# Patient Record
Sex: Female | Born: 1980 | Race: White | Hispanic: No | Smoking: Current some day smoker
Health system: Southern US, Community
[De-identification: ages and names within clinical notes are randomized; demographics above are authoritative.]

## PROBLEM LIST (undated history)

## (undated) DIAGNOSIS — F32A Depression, unspecified: Secondary | ICD-10-CM

## (undated) DIAGNOSIS — F329 Major depressive disorder, single episode, unspecified: Secondary | ICD-10-CM

## (undated) DIAGNOSIS — E7439 Other disorders of intestinal carbohydrate absorption: Secondary | ICD-10-CM

## (undated) DIAGNOSIS — C801 Malignant (primary) neoplasm, unspecified: Secondary | ICD-10-CM

---
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
## 2008-04-02 ENCOUNTER — Emergency Department (HOSPITAL_COMMUNITY): Admission: EM | Admit: 2008-04-02 | Discharge: 2008-04-02 | Payer: Self-pay | Admitting: Emergency Medicine

## 2019-03-04 ENCOUNTER — Encounter (HOSPITAL_COMMUNITY): Payer: Self-pay | Admitting: Emergency Medicine

## 2019-03-04 ENCOUNTER — Emergency Department (HOSPITAL_COMMUNITY)
Admission: EM | Admit: 2019-03-04 | Discharge: 2019-03-04 | Disposition: A | Discharge status: Home / Self Care | Payer: BLUE CROSS/BLUE SHIELD | Attending: Emergency Medicine | Admitting: Emergency Medicine

## 2019-03-04 ENCOUNTER — Other Ambulatory Visit: Payer: Self-pay

## 2019-03-04 ENCOUNTER — Emergency Department (HOSPITAL_COMMUNITY): Payer: BLUE CROSS/BLUE SHIELD

## 2019-03-04 DIAGNOSIS — F17210 Nicotine dependence, cigarettes, uncomplicated: Secondary | ICD-10-CM | POA: Insufficient documentation

## 2019-03-04 DIAGNOSIS — Z79899 Other long term (current) drug therapy: Secondary | ICD-10-CM | POA: Insufficient documentation

## 2019-03-04 DIAGNOSIS — K047 Periapical abscess without sinus: Secondary | ICD-10-CM | POA: Insufficient documentation

## 2019-03-04 HISTORY — DX: Depression, unspecified: F32.A

## 2019-03-04 HISTORY — DX: Major depressive disorder, single episode, unspecified: F32.9

## 2019-03-04 LAB — BASIC METABOLIC PANEL
Anion gap: 10 (ref 5–15)
BUN: 24 mg/dL — ABNORMAL HIGH (ref 6–20)
CO2: 24 mmol/L (ref 22–32)
Calcium: 9.4 mg/dL (ref 8.9–10.3)
Chloride: 103 mmol/L (ref 98–111)
Creatinine, Ser: 0.8 mg/dL (ref 0.44–1.00)
GFR calc Af Amer: >60 mL/min (ref >60)
GFR calc non Af Amer: >60 mL/min (ref >60)
Glucose, Bld: 113 mg/dL — ABNORMAL HIGH (ref 70–99)
Potassium: 4.5 mmol/L (ref 3.5–5.1)
Sodium: 137 mmol/L (ref 135–145)

## 2019-03-04 LAB — CBC WITH DIFFERENTIAL/PLATELET
Abs Immature Granulocytes: 0.32 10*3/uL — ABNORMAL HIGH (ref 0.00–0.07)
Basophils Absolute: 0.1 10*3/uL (ref 0.0–0.1)
Basophils Relative: 1 %
Eosinophils Absolute: 0.1 10*3/uL (ref 0.0–0.5)
Eosinophils Relative: 0 %
HCT: 41 % (ref 36.0–46.0)
Hemoglobin: 13 g/dL (ref 12.0–15.0)
Immature Granulocytes: 2 %
Lymphocytes Relative: 18 %
Lymphs Abs: 3 10*3/uL (ref 0.7–4.0)
MCH: 27.7 pg (ref 26.0–34.0)
MCHC: 31.7 g/dL (ref 30.0–36.0)
MCV: 87.4 fL (ref 80.0–100.0)
Monocytes Absolute: 0.8 10*3/uL (ref 0.1–1.0)
Monocytes Relative: 5 %
Neutro Abs: 12.3 10*3/uL — ABNORMAL HIGH (ref 1.7–7.7)
Neutrophils Relative %: 74 %
Platelets: 349 10*3/uL (ref 150–400)
RBC: 4.69 MIL/uL (ref 3.87–5.11)
RDW: 14.6 % (ref 11.5–15.5)
WBC: 16.6 10*3/uL — ABNORMAL HIGH (ref 4.0–10.5)
nRBC: 0 % (ref 0.0–0.2)

## 2019-03-04 LAB — PREGNANCY, URINE: Preg Test, Ur: NEGATIVE

## 2019-03-04 MED ORDER — IOHEXOL 300 MG/ML  SOLN
75.0000 mL | Freq: Once | INTRAMUSCULAR | Status: AC | PRN
  Administered 2019-03-04: 19:00:00 75 mL via INTRAVENOUS

## 2019-03-04 MED ORDER — AMOXICILLIN-POT CLAVULANATE 875-125 MG PO TABS: 1.0000 | ORAL_TABLET | Freq: Two times a day (BID) | ORAL | 0 refills | Status: DC

## 2019-03-04 MED ORDER — SODIUM CHLORIDE 0.9 % IV SOLN
3.0000 g | Freq: Once | INTRAVENOUS | Status: AC
  Administered 2019-03-04: 3 g via INTRAVENOUS
  Filled 2019-03-04: qty 3

## 2019-03-04 MED ORDER — SODIUM CHLORIDE 0.9 % IV BOLUS
1000.0000 mL | Freq: Once | INTRAVENOUS | Status: AC
  Administered 2019-03-04: 1000 mL via INTRAVENOUS

## 2019-03-04 NOTE — ED Triage Notes (Signed)
Pt states that she has a abscess in her neck and she was send by doctor she can't raise her arms above her head. Her throat is scratchy

## 2019-03-04 NOTE — ED Provider Notes (Addendum)
[facility] EMERGENCY DEPARTMENT Provider Note   CSN: [id number] Arrival date & time: 03/04/19  1436    History   Chief Complaint Chief Complaint  Patient presents with   Abscess    HPI Tara Aguilar is a 38 y.o. female.     HPI  38 year old female presents with anterior neck swelling and concern for infection.  She states that 6 days ago she went to [facility] because she woke up with right facial pain and swelling.  Was treated for an infection with clindamycin which she is currently still on, IV steroids and IV Toradol.  She is also been on pain meds.  States all of these symptoms including the facial swelling has improved.  Occasionally she will have some purulent drainage from her mouth.  However over the last couple days she has noticed anterior neck swelling.  Talked to a telemedicine doctor who recommended she come to the hospital.  She is noticed that when she lifts her arms above her head or tries to touch the back of her head, it causes increasing neck pain.  She states it is occasionally hard to swallow but otherwise has been swallowing pretty well and has had no shortness of breath.  There is been no fevers or cough. She is currently on a prednisone taper.  Past Medical History:  Diagnosis Date   Depression     There are no active problems to display for this patient.      OB History   No obstetric history on file.      Home Medications    Prior to Admission medications   Medication Sig Start Date End Date Taking? Authorizing Provider  buPROPion (WELLBUTRIN XL) 150 MG 24 hr tablet Take 150 mg by mouth daily.   Yes [provider]  clindamycin (CLEOCIN) 300 MG capsule Take 300 mg by mouth 4 (four) times daily.   Yes [provider]  HYDROcodone-acetaminophen (NORCO/VICODIN) 5-325 MG tablet Take 1 tablet by mouth every 12 (twelve) hours as needed for moderate pain.   Yes [provider]  ibuprofen (ADVIL) 400 MG tablet Take 800  mg by mouth every 6 (six) hours as needed.   Yes [provider]  lidocaine (XYLOCAINE) 2 % solution Use as directed 10 mLs in the mouth or throat 4 (four) times daily. 02/26/19  Yes [provider]  methylPREDNISolone (MEDROL DOSEPAK) 4 MG TBPK tablet Take 4 mg by mouth as directed.   Yes [provider]    Family History No family history on file.  Social History Social History   Tobacco Use   Smoking status: Current Every Day Smoker    Packs/day: 1.00    Years: 13.00    Pack years: 13.00    Types: Cigarettes   Smokeless tobacco: Never Used  Substance Use Topics   Alcohol use: Not on file    Comment: social    Drug use: Not on file     Allergies   Patient has no allergy information on record.   Review of Systems Review of Systems  Constitutional: Negative for fever.  HENT: Positive for facial swelling. Negative for sore throat.   Respiratory: Negative for cough and shortness of breath.   Gastrointestinal: Negative for vomiting.  Musculoskeletal: Positive for neck pain.  All other systems reviewed and are negative.    Physical Exam Updated Vital Signs BP 136/87    Pulse 88    Temp 98.6 F (37 C) (Oral)  Resp 19    Ht 5\' 6"  (1.676 m)    Wt 131.5 kg    SpO2 96%    BMI 46.81 kg/m   Physical Exam Vitals signs and nursing note reviewed.  Constitutional:      General: She is not in acute distress.    Appearance: She is well-developed. She is obese. She is not ill-appearing or diaphoretic.  HENT:     Head: Normocephalic and atraumatic.      Right Ear: External ear normal.     Left Ear: External ear normal.     Nose: Nose normal.     Mouth/Throat:     Pharynx: Oropharynx is clear. Uvula midline. No pharyngeal swelling, oropharyngeal exudate or posterior oropharyngeal erythema. Uvula swelling: mild swelling without fluctuance over maxillary incisor.     Comments: No tenderness to gums/teeth No floor of mouth swelling or  tenderness Eyes:     General:        Right eye: No discharge.        Left eye: No discharge.  Neck:     Comments: Mildly limited ROM of neck, but overall pretty normal.  Mild to moderate anterior neck swelling. No change in skin color, increased warmth or significant tenderness. Cardiovascular:     Rate and Rhythm: Normal rate and regular rhythm.     Heart sounds: Normal heart sounds.  Pulmonary:     Effort: Pulmonary effort is normal.     Breath sounds: Normal breath sounds.  Abdominal:     Palpations: Abdomen is soft.     Tenderness: There is no abdominal tenderness.  Skin:    General: Skin is warm and dry.  Neurological:     Mental Status: She is alert.  Psychiatric:        Mood and Affect: Mood is not anxious.      ED Treatments / Results  Labs (all labs ordered are listed, but only abnormal results are displayed) Labs Reviewed  BASIC METABOLIC PANEL - Abnormal; Notable for the following components:      Result Value   Glucose, Bld 113 (*)    BUN 24 (*)    All other components within normal limits  CBC WITH DIFFERENTIAL/PLATELET - Abnormal; Notable for the following components:   WBC 16.6 (*)    Neutro Abs 12.3 (*)    Abs Immature Granulocytes 0.32 (*)    All other components within normal limits  PREGNANCY, URINE    EKG None  Radiology Ct Soft Tissue Neck W Contrast  Result Date: 03/04/2019 CLINICAL DATA:  Initial evaluation for acute anterior neck swelling, neck mass. Sore throat. EXAM: CT NECK WITH CONTRAST TECHNIQUE: Multidetector CT imaging of the neck was performed using the standard protocol following the bolus administration of intravenous contrast. CONTRAST:  98mL OMNIPAQUE IOHEXOL 300 MG/ML  SOLN COMPARISON:  None available. FINDINGS: Pharynx and larynx: Oral cavity within normal limits without discrete mass or loculated fluid collection. Prominent periapical lucency with dehiscence of the overlying alveolar ridge present at the right maxillary lateral  incisor. Overlying soft tissue swelling with inflammatory stranding concerning for odontogenic infection. Ill-defined hypodense collection within this region measuring 8 x 12 x 11 mm suspicious for early/developing odontogenic abscess (series 2, image 7). Additional hazy inflammatory stranding seen involving the subcutaneous fat inferiorly within the anterior neck, just inferior to the mandible (series 2, image 53). Associated thickening of the platysmas, right greater than left. Findings suspicious for additional site of infection/cellulitis. No base of tongue lesion.  Palatine tonsils symmetric and within normal limits. Nasopharynx within normal limits. Moderate retropharyngeal effusion seen diffusely throughout the retropharyngeal soft tissues. No loculated collection or retropharyngeal abscess. Lateral extension into the carotid spaces bilaterally. Mild mass effect on the hypopharynx anteriorly. Supraglottic airway remains patent at this time. No significant associated mucosal edema. Epiglottis normal. Vallecula clear. Remainder of the hypopharynx and supraglottic larynx within normal limits. True cords symmetric and grossly normal. Subglottic airway clear. Salivary glands: Salivary glands including the parotid and submandibular glands are normal. Thyroid: Thyroid within normal limits. Lymph nodes: Increased number of lymph nodes seen within the neck bilaterally. Superimposed enlarged bilateral level II lymph nodes measure up to 16 mm on the right and 13 mm on the left. Level III nodes measure up to 11 mm bilaterally. There is an enlarged soft tissue mass at right level IV measuring 6.2 x 4.7 x 4.6 cm, likely a nodal conglomerate (series 2, image 84). An enlarged nodal conglomerate within the right paratracheal region measures approximately 6.0 x 3.6 x 6.5 cm (series 2, image 117). Few additional scattered enlarged mediastinal lymph nodes noted. Few mildly prominent posterior chain nodes measure up to 9 mm on the  right. Vascular: Normal intravascular enhancement seen throughout the neck. Limited intracranial: Unremarkable. Visualized orbits: Not included on this exam. Mastoids and visualized paranasal sinuses: Visualized paranasal sinuses are clear. Mastoid air cells and middle ear cavities are largely well pneumatized and free of fluid. Skeleton: No acute osseous finding. No discrete lytic or blastic osseous lesions. Mild cervical spondylolysis noted at C6-7. Upper chest: Remainder of the partially visualized upper chest demonstrates no other acute finding. Partially visualized lungs are clear. Other: None. IMPRESSION: 1. Hazy inflammatory stranding with swelling involving the subcutaneous fat of the lower anterior neck, just inferior to the mandible, suspicious for possible infection/cellulitis. Moderate diffuse retropharyngeal effusion suspected to be reactive. No discrete retropharyngeal abscess. 2. Prominent periapical lucency about the right maxillary lateral incisor with adjacent inflammatory stranding, concerning for acute odontogenic infection. Superimposed 8 x 12 x 11 mm hypodense collection within this region compatible with early odontogenic abscess. 3. Enlarged nodal conglomerates within the right lower neck and right mediastinum measuring up to 6 cm in diameter. Given size, these are felt to be unlikely be reactive in nature, and are most concerning for possible concomitant lymphoproliferative disorder or nodal metastases. No primary mass lesion seen within the neck. Correlation with histology may be helpful for further evaluation. Additional prominent bilateral cervical lymph nodes as above are more indeterminate, and could be reactive. Electronically Signed   By: Jeannine Boga M.D.   On: 03/04/2019 19:57    Procedures Procedures (including critical care time)  Medications Ordered in ED Medications  Ampicillin-Sulbactam (UNASYN) 3 g in sodium chloride 0.9 % 100 mL IVPB (has no administration in  time range)  sodium chloride 0.9 % bolus 1,000 mL (1,000 mLs Intravenous New Bag/Given 03/04/19 1549)  iohexol (OMNIPAQUE) 300 MG/ML solution 75 mL (75 mLs Intravenous Contrast Given 03/04/19 1858)     Initial Impression / Assessment and Plan / ED Course  I have reviewed the triage vital signs and the nursing notes.  Pertinent labs & imaging results that were available during my care of the patient were reviewed by me and considered in my medical decision making (see chart for details).        CT scan shows some neck swelling but no overt neck abscess.  She is breathing well without difficulty.  Does show an odontogenic infection/likely  abscess.  I discussed with ENT Dr. Benjamine Mola, who recommends oral surgery.  We do not have an oral surgeon on tonight but there is one on during the daytime earlier today and tomorrow.  Offered the patient transfer to another facility, local incision/drainage though I don't know that I can get to the depth required, and possible follow-up with oral surgery tomorrow or early next week.  She prefers to call the oral surgeon tomorrow and go from there.  I think this is reasonable as she appears quite well.  She does have an elevated WBC but no fever and she has been on steroids recently.  She is quite well-appearing.  We discussed strict return precautions and will change her antibiotics to Augmentin.  Otherwise, strict return precautions.  Final Clinical Impressions(s) / ED Diagnoses   Final diagnoses:  Dental abscess    ED Discharge Orders    None       Sherwood Gambler, MD 03/04/19 2050    Sherwood Gambler, MD 03/04/19 2051    Sherwood Gambler, MD 03/04/19 2150

## 2019-03-04 NOTE — Discharge Instructions (Signed)
If you develop fever, vomiting, trouble breathing or swallowing, or any other new/concerning symptoms then return to the ER or call 911.  Otherwise call the oral surgeon in the morning.

## 2019-03-04 NOTE — ED Notes (Signed)
Pt at ct

## 2019-03-05 ENCOUNTER — Encounter (HOSPITAL_COMMUNITY): Payer: Self-pay | Admitting: Emergency Medicine

## 2019-03-05 ENCOUNTER — Other Ambulatory Visit: Payer: Self-pay

## 2019-03-05 ENCOUNTER — Inpatient Hospital Stay (HOSPITAL_COMMUNITY)
Admission: EM | Admit: 2019-03-05 | Discharge: 2019-03-08 | DRG: 133 | Disposition: A | Discharge status: Home / Self Care | Payer: BLUE CROSS/BLUE SHIELD | Attending: Internal Medicine | Admitting: Internal Medicine

## 2019-03-05 ENCOUNTER — Emergency Department (HOSPITAL_COMMUNITY): Payer: BLUE CROSS/BLUE SHIELD

## 2019-03-05 DIAGNOSIS — G479 Sleep disorder, unspecified: Secondary | ICD-10-CM | POA: Diagnosis present

## 2019-03-05 DIAGNOSIS — T501X5A Adverse effect of loop [high-ceiling] diuretics, initial encounter: Secondary | ICD-10-CM | POA: Diagnosis present

## 2019-03-05 DIAGNOSIS — Z6841 Body Mass Index (BMI) 40.0 and over, adult: Secondary | ICD-10-CM

## 2019-03-05 DIAGNOSIS — K045 Chronic apical periodontitis: Secondary | ICD-10-CM | POA: Diagnosis present

## 2019-03-05 DIAGNOSIS — K122 Cellulitis and abscess of mouth: Secondary | ICD-10-CM | POA: Diagnosis present

## 2019-03-05 DIAGNOSIS — K029 Dental caries, unspecified: Secondary | ICD-10-CM | POA: Diagnosis present

## 2019-03-05 DIAGNOSIS — Z1159 Encounter for screening for other viral diseases: Secondary | ICD-10-CM

## 2019-03-05 DIAGNOSIS — F419 Anxiety disorder, unspecified: Secondary | ICD-10-CM | POA: Diagnosis present

## 2019-03-05 DIAGNOSIS — J390 Retropharyngeal and parapharyngeal abscess: Principal | ICD-10-CM | POA: Diagnosis present

## 2019-03-05 DIAGNOSIS — N179 Acute kidney failure, unspecified: Secondary | ICD-10-CM | POA: Diagnosis present

## 2019-03-05 DIAGNOSIS — Z720 Tobacco use: Secondary | ICD-10-CM | POA: Diagnosis not present

## 2019-03-05 DIAGNOSIS — I871 Compression of vein: Secondary | ICD-10-CM | POA: Diagnosis present

## 2019-03-05 DIAGNOSIS — J9859 Other diseases of mediastinum, not elsewhere classified: Secondary | ICD-10-CM | POA: Diagnosis not present

## 2019-03-05 DIAGNOSIS — C770 Secondary and unspecified malignant neoplasm of lymph nodes of head, face and neck: Secondary | ICD-10-CM | POA: Diagnosis present

## 2019-03-05 DIAGNOSIS — F329 Major depressive disorder, single episode, unspecified: Secondary | ICD-10-CM | POA: Diagnosis present

## 2019-03-05 DIAGNOSIS — M264 Malocclusion, unspecified: Secondary | ICD-10-CM | POA: Diagnosis present

## 2019-03-05 DIAGNOSIS — R0609 Other forms of dyspnea: Secondary | ICD-10-CM | POA: Diagnosis not present

## 2019-03-05 DIAGNOSIS — K047 Periapical abscess without sinus: Secondary | ICD-10-CM

## 2019-03-05 DIAGNOSIS — R590 Localized enlarged lymph nodes: Secondary | ICD-10-CM | POA: Diagnosis present

## 2019-03-05 DIAGNOSIS — E7439 Other disorders of intestinal carbohydrate absorption: Secondary | ICD-10-CM | POA: Diagnosis present

## 2019-03-05 DIAGNOSIS — K083 Retained dental root: Secondary | ICD-10-CM | POA: Diagnosis present

## 2019-03-05 DIAGNOSIS — L03211 Cellulitis of face: Secondary | ICD-10-CM | POA: Diagnosis present

## 2019-03-05 DIAGNOSIS — Z79899 Other long term (current) drug therapy: Secondary | ICD-10-CM

## 2019-03-05 DIAGNOSIS — R7303 Prediabetes: Secondary | ICD-10-CM | POA: Diagnosis present

## 2019-03-05 DIAGNOSIS — F32A Depression, unspecified: Secondary | ICD-10-CM | POA: Diagnosis present

## 2019-03-05 DIAGNOSIS — C801 Malignant (primary) neoplasm, unspecified: Secondary | ICD-10-CM | POA: Diagnosis not present

## 2019-03-05 DIAGNOSIS — L03221 Cellulitis of neck: Secondary | ICD-10-CM

## 2019-03-05 DIAGNOSIS — R220 Localized swelling, mass and lump, head: Secondary | ICD-10-CM

## 2019-03-05 DIAGNOSIS — F17210 Nicotine dependence, cigarettes, uncomplicated: Secondary | ICD-10-CM | POA: Diagnosis present

## 2019-03-05 LAB — CBC WITH DIFFERENTIAL/PLATELET
Abs Immature Granulocytes: 0.15 10*3/uL — ABNORMAL HIGH (ref 0.00–0.07)
Basophils Absolute: 0.1 10*3/uL (ref 0.0–0.1)
Basophils Relative: 1 %
Eosinophils Absolute: 0.1 10*3/uL (ref 0.0–0.5)
Eosinophils Relative: 1 %
HCT: 40.9 % (ref 36.0–46.0)
Hemoglobin: 13.1 g/dL (ref 12.0–15.0)
Immature Granulocytes: 1 %
Lymphocytes Relative: 16 %
Lymphs Abs: 2.1 10*3/uL (ref 0.7–4.0)
MCH: 28 pg (ref 26.0–34.0)
MCHC: 32 g/dL (ref 30.0–36.0)
MCV: 87.4 fL (ref 80.0–100.0)
Monocytes Absolute: 0.8 10*3/uL (ref 0.1–1.0)
Monocytes Relative: 6 %
Neutro Abs: 9.6 10*3/uL — ABNORMAL HIGH (ref 1.7–7.7)
Neutrophils Relative %: 75 %
Platelets: 322 10*3/uL (ref 150–400)
RBC: 4.68 MIL/uL (ref 3.87–5.11)
RDW: 14.8 % (ref 11.5–15.5)
WBC: 12.8 10*3/uL — ABNORMAL HIGH (ref 4.0–10.5)
nRBC: 0 % (ref 0.0–0.2)

## 2019-03-05 LAB — BASIC METABOLIC PANEL
Anion gap: 10 (ref 5–15)
BUN: 19 mg/dL (ref 6–20)
CO2: 24 mmol/L (ref 22–32)
Calcium: 9.2 mg/dL (ref 8.9–10.3)
Chloride: 103 mmol/L (ref 98–111)
Creatinine, Ser: 0.81 mg/dL (ref 0.44–1.00)
GFR calc Af Amer: >60 mL/min (ref >60)
GFR calc non Af Amer: >60 mL/min (ref >60)
Glucose, Bld: 125 mg/dL — ABNORMAL HIGH (ref 70–99)
Potassium: 4 mmol/L (ref 3.5–5.1)
Sodium: 137 mmol/L (ref 135–145)

## 2019-03-05 LAB — SARS CORONAVIRUS 2 BY RT PCR (HOSPITAL ORDER, PERFORMED IN ~~LOC~~ HOSPITAL LAB): SARS Coronavirus 2: NEGATIVE

## 2019-03-05 LAB — MRSA PCR SCREENING: MRSA by PCR: NEGATIVE

## 2019-03-05 LAB — LACTIC ACID, PLASMA
Lactic Acid, Venous: 1.1 mmol/L (ref 0.5–1.9)
Lactic Acid, Venous: 1.1 mmol/L (ref 0.5–1.9)

## 2019-03-05 MED ORDER — SODIUM CHLORIDE 0.9 % IV SOLN
INTRAVENOUS | Status: DC
  Administered 2019-03-06 – 2019-03-07 (×4): via INTRAVENOUS

## 2019-03-05 MED ORDER — ONDANSETRON HCL 4 MG PO TABS: 4.0000 mg | ORAL_TABLET | Freq: Four times a day (QID) | ORAL | Status: DC | PRN

## 2019-03-05 MED ORDER — CLINDAMYCIN PHOSPHATE 900 MG/50ML IV SOLN
900.0000 mg | Freq: Once | INTRAVENOUS | Status: AC
  Administered 2019-03-05: 15:00:00 900 mg via INTRAVENOUS
  Filled 2019-03-05: qty 50

## 2019-03-05 MED ORDER — ACETAMINOPHEN 325 MG PO TABS
650.0000 mg | ORAL_TABLET | Freq: Four times a day (QID) | ORAL | Status: DC | PRN
  Administered 2019-03-07 – 2019-03-08 (×3): 650 mg via ORAL
  Filled 2019-03-05 (×3): qty 2

## 2019-03-05 MED ORDER — ONDANSETRON HCL 4 MG/2ML IJ SOLN: 4.0000 mg | Freq: Four times a day (QID) | INTRAMUSCULAR | Status: DC | PRN

## 2019-03-05 MED ORDER — ACETAMINOPHEN 650 MG RE SUPP: 650.0000 mg | Freq: Four times a day (QID) | RECTAL | Status: DC | PRN

## 2019-03-05 MED ORDER — IOHEXOL 300 MG/ML  SOLN
75.0000 mL | Freq: Once | INTRAMUSCULAR | Status: AC | PRN
  Administered 2019-03-05: 75 mL via INTRAVENOUS

## 2019-03-05 MED ORDER — BUPROPION HCL ER (XL) 150 MG PO TB24
150.0000 mg | ORAL_TABLET | Freq: Every day | ORAL | Status: DC
  Administered 2019-03-06 – 2019-03-08 (×3): 150 mg via ORAL
  Filled 2019-03-05 (×3): qty 1

## 2019-03-05 MED ORDER — ACETAMINOPHEN 325 MG PO TABS
ORAL_TABLET | ORAL | Status: AC
  Administered 2019-03-05: 650 mg
  Filled 2019-03-05: qty 2

## 2019-03-05 MED ORDER — IBUPROFEN 600 MG PO TABS
600.0000 mg | ORAL_TABLET | Freq: Four times a day (QID) | ORAL | Status: DC | PRN
  Administered 2019-03-06: 12:00:00 600 mg via ORAL
  Filled 2019-03-05: qty 1
  Filled 2019-03-05: qty 3

## 2019-03-05 NOTE — H&P (Addendum)
History and Physical  Tara Aguilar LPF:[id number] DOB: 13-Mar-1981 DOA: 03/05/2019  Referring physician: Dr Thurnell Garbe, ED physician PCP: [facility]  Outpatient Specialists:   Patient Coming From: home  Chief Complaint: face and neck swelling  HPI: Tara Aguilar is a 38 y.o. female with a history of depression seen for right sided face and neck swelling that started about a week ago. She was seen at [facility] ED and was treated with Clindamycin and IV steroids. She was told that she needed to see a dentist for an abscessed tooth. Her symptoms improved a little, but began to have purulent drainage in her mouth and had some anterior neck swelling. She talked with a telemedicine provider yesterday, who recommended being seen at the ED. She had a CT done, which showed an odontogenic abscess with overlying cellulitis. She was given a prescription for augmentin and discharged to home. This morning, she experienced worsening swelling and returned to the hospital.   No particular palliating or provoking factors, although complains that raising her hands above her head makes it more difficult to breath. Denies fevers, chills, nausea, vomiting, difficulty breathing, difficulty or pain with swallowing.   Emergency Department Course: Repeat CT shows unchanged odontogenic abscess, but increased retropharyngeal fluid with mass effect, concerning for developing retropharyngeal abscess.  Review of Systems:   Pt denies any fevers, chills, nausea, vomiting, diarrhea, constipation, abdominal pain, shortness of breath, dyspnea on exertion, orthopnea, cough, wheezing, palpitations, headache, vision changes, lightheadedness, dizziness, melena, rectal bleeding.  Review of systems are otherwise negative  Past Medical History:  Diagnosis Date   Depression    Past Surgical History:  Procedure Laterality Date   CESAREAN SECTION     Social History:  reports that she has been smoking cigarettes. She has a  13.00 pack-year smoking history. She has never used smokeless tobacco. She reports previous drug use. No history on file for alcohol. Patient lives at home  No Known Allergies  No family history on file.    Prior to Admission medications   Medication Sig Start Date End Date Taking? Authorizing Provider  amoxicillin-clavulanate (AUGMENTIN) 875-125 MG tablet Take 1 tablet by mouth 2 (two) times daily. One po bid x 7 days 03/04/19  Yes Sherwood Gambler, MD  buPROPion (WELLBUTRIN XL) 150 MG 24 hr tablet Take 150 mg by mouth daily.   Yes [provider]  clindamycin (CLEOCIN) 300 MG capsule Take 300 mg by mouth 4 (four) times daily.   Yes [provider]  ibuprofen (ADVIL) 400 MG tablet Take 800 mg by mouth every 6 (six) hours as needed.   Yes [provider]  lidocaine (XYLOCAINE) 2 % solution Use as directed 10 mLs in the mouth or throat 4 (four) times daily. 02/26/19  Yes [provider]  methylPREDNISolone (MEDROL DOSEPAK) 4 MG TBPK tablet Take 4 mg by mouth as directed.   Yes [provider]    Physical Exam: BP 137/90    Pulse 100    Temp 98.8 F (37.1 C) (Oral)    Resp 18    Ht 5\' 6"  (1.676 m)    Wt 131.5 kg    LMP 02/21/2019 (Approximate)    SpO2 95%    BMI 46.81 kg/m    General: Young caucasian female. Awake and alert and oriented x3. No acute cardiopulmonary distress.   HEENT: Normocephalic atraumatic.  Right and left ears normal in appearance.  Pupils equal, round, reactive to light. Extraocular muscles are intact. Sclerae anicteric and noninjected.  Moist mucosal membranes. Poor dentition. Right palate appears more edematous and erythemic. Increased erythema to right lower face, which extends to anterior and lateral neck. Increased submandibular lymphadenopathy to that side.   Neck: Neck supple without lymphadenopathy. No carotid bruits. No masses palpated.   Cardiovascular: Regular rate with normal S1-S2 sounds. No murmurs, rubs, gallops  auscultated. No JVD.   Respiratory: Good respiratory effort with no wheezes, rales, rhonchi. Lungs clear to auscultation bilaterally.  No accessory muscle use.  Abdomen: Soft, nontender, nondistended. Active bowel sounds. No masses or hepatosplenomegaly   Skin: No rashes, lesions, or ulcerations.  Dry, warm to touch. 2+ dorsalis pedis and radial pulses.  Musculoskeletal: No calf or leg pain. All major joints not erythematous nontender.  No upper or lower joint deformation.  Good ROM.  No contractures   Psychiatric: Intact judgment and insight. Pleasant and cooperative.  Neurologic: No focal neurological deficits. Strength is 5/5 and symmetric in upper and lower extremities.  Cranial nerves II through XII are grossly intact.           Labs on Admission: I have personally reviewed following labs and imaging studies  CBC: Recent Labs  Lab 03/04/19 1545 03/05/19 1410  WBC 16.6* 12.8*  NEUTROABS 12.3* 9.6*  HGB 13.0 13.1  HCT 41.0 40.9  MCV 87.4 87.4  PLT 349 330   Basic Metabolic Panel: Recent Labs  Lab 03/04/19 1545 03/05/19 1410  NA 137 137  K 4.5 4.0  CL 103 103  CO2 24 24  GLUCOSE 113* 125*  BUN 24* 19  CREATININE 0.80 0.81  CALCIUM 9.4 9.2   GFR: Estimated Creatinine Clearance: 129.8 mL/min (by C-G formula based on SCr of 0.81 mg/dL). Liver Function Tests: No results for input(s): AST, ALT, ALKPHOS, BILITOT, PROT, ALBUMIN in the last 168 hours. No results for input(s): LIPASE, AMYLASE in the last 168 hours. No results for input(s): AMMONIA in the last 168 hours. Coagulation Profile: No results for input(s): INR, PROTIME in the last 168 hours. Cardiac Enzymes: No results for input(s): CKTOTAL, CKMB, CKMBINDEX, TROPONINI in the last 168 hours. BNP (last 3 results) No results for input(s): PROBNP in the last 8760 hours. HbA1C: No results for input(s): HGBA1C in the last 72 hours. CBG: No results for input(s): GLUCAP in the last 168 hours. Lipid Profile: No  results for input(s): CHOL, HDL, LDLCALC, TRIG, CHOLHDL, LDLDIRECT in the last 72 hours. Thyroid Function Tests: No results for input(s): TSH, T4TOTAL, FREET4, T3FREE, THYROIDAB in the last 72 hours. Anemia Panel: No results for input(s): VITAMINB12, FOLATE, FERRITIN, TIBC, IRON, RETICCTPCT in the last 72 hours. Urine analysis: No results found for: COLORURINE, APPEARANCEUR, LABSPEC, PHURINE, GLUCOSEU, HGBUR, BILIRUBINUR, KETONESUR, PROTEINUR, UROBILINOGEN, NITRITE, LEUKOCYTESUR Sepsis Labs: @LABRCNTIP (procalcitonin:4,lacticidven:4) )No results found for this or any previous visit (from the past 240 hour(s)).   Radiological Exams on Admission: Dg Chest 2 View  Result Date: 03/05/2019 CLINICAL DATA:  Facial swelling and known peritonsillar abscess EXAM: CHEST - 2 VIEW COMPARISON:  None. FINDINGS: Cardiac shadows within normal limits. Lungs are well aerated bilaterally. Fullness in the right paratracheal region is noted consistent with the lymphadenopathy seen on recent CT of the neck. No focal infiltrate or effusion is seen. No acute bony abnormality is noted. IMPRESSION: Right paratracheal lymphadenopathy similar to that seen on recent CT examination. No other focal abnormality is noted. Electronically Signed   By: Inez Catalina M.D.   On: 03/05/2019 15:58   Ct Soft Tissue Neck W Contrast  Result Date: 03/05/2019  CLINICAL DATA:  Worsening facial and neck swelling with difficulty swallowing. EXAM: CT NECK WITH CONTRAST TECHNIQUE: Multidetector CT imaging of the neck was performed using the standard protocol following the bolus administration of intravenous contrast. CONTRAST:  38mL OMNIPAQUE IOHEXOL 300 MG/ML  SOLN COMPARISON:  03/04/2019 FINDINGS: Pharynx and larynx: Symmetric pharyngeal soft tissues without evidence of mass. Diffuse retropharyngeal fluid has increased and now measures up to 2.4 cm in AP thickness at the C3 level with mild mass effect on the posterior oropharynx. No rim enhancement of  this fluid. No peritonsillar abscess. Patent airway. Unremarkable larynx. Salivary glands: No inflammation, mass, or stone. Thyroid: Unremarkable. Lymph nodes: Unchanged bilateral cervical lymphadenopathy including level IIa lymph nodes measuring up to 16 mm in short axis on the right and 14 mm on the left as well as a 6.1 x 4.8 cm nodal conglomerate on the right level IV. Similar appearance of partially visualized mediastinal lymphadenopathy including a 6.4 x 4.1 cm right paratracheal mass. Vascular: Major vascular structures in the neck are grossly patent although the right internal jugular vein appears compressed in the lower neck by the nodal mass and is poorly visualized near the confluence with the subclavian vein, partly due to patient body habitus. Limited intracranial: Unremarkable. Visualized orbits: Not imaged. Mastoids and visualized paranasal sinuses: Small left maxillary sinus mucous retention cyst. Clear mastoid air cells. Skeleton: Mild lower cervical spondylosis at C6-7. Advanced facet arthrosis in the included thoracic spine. No suspicious osseous lesion. Upper chest: Clear lung apices. Other: Multiple dental caries and periapical lucencies including a prominent periapical lucency involving the right lateral maxillary incisor with disruption of the buccal cortex of the alveolar ridge with an unchanged overlying 8 x 6 mm fluid collection and similar appearance of overlying soft tissue inflammation. Fat stranding more inferiorly in the lower face, right greater than left with asymmetric thickening of the platysma on the right, is stable to slightly increased. IMPRESSION: 1. Odontogenic infection with unchanged 8 x 6 mm subperiosteal abscess associated with the right maxillary incisor. 2. Increased retropharyngeal fluid with mild mass effect. While there is no rim enhancement and a sterile effusion is possible, retropharyngeal abscess is a concern given its size and progression since yesterday. 3.  Mildly increased inflammatory stranding/swelling in the lower face and neck without new fluid collection. 4. Unchanged cervical and mediastinal lymphadenopathy including large conglomerate nodal masses in the right lower neck and superior mediastinum concerning for metastatic disease or lymphoproliferative disease. Electronically Signed   By: Logan Bores M.D.   On: 03/05/2019 16:15   Ct Soft Tissue Neck W Contrast  Result Date: 03/04/2019 CLINICAL DATA:  Initial evaluation for acute anterior neck swelling, neck mass. Sore throat. EXAM: CT NECK WITH CONTRAST TECHNIQUE: Multidetector CT imaging of the neck was performed using the standard protocol following the bolus administration of intravenous contrast. CONTRAST:  70mL OMNIPAQUE IOHEXOL 300 MG/ML  SOLN COMPARISON:  None available. FINDINGS: Pharynx and larynx: Oral cavity within normal limits without discrete mass or loculated fluid collection. Prominent periapical lucency with dehiscence of the overlying alveolar ridge present at the right maxillary lateral incisor. Overlying soft tissue swelling with inflammatory stranding concerning for odontogenic infection. Ill-defined hypodense collection within this region measuring 8 x 12 x 11 mm suspicious for early/developing odontogenic abscess (series 2, image 7). Additional hazy inflammatory stranding seen involving the subcutaneous fat inferiorly within the anterior neck, just inferior to the mandible (series 2, image 53). Associated thickening of the platysmas, right greater than left. Findings  suspicious for additional site of infection/cellulitis. No base of tongue lesion. Palatine tonsils symmetric and within normal limits. Nasopharynx within normal limits. Moderate retropharyngeal effusion seen diffusely throughout the retropharyngeal soft tissues. No loculated collection or retropharyngeal abscess. Lateral extension into the carotid spaces bilaterally. Mild mass effect on the hypopharynx anteriorly.  Supraglottic airway remains patent at this time. No significant associated mucosal edema. Epiglottis normal. Vallecula clear. Remainder of the hypopharynx and supraglottic larynx within normal limits. True cords symmetric and grossly normal. Subglottic airway clear. Salivary glands: Salivary glands including the parotid and submandibular glands are normal. Thyroid: Thyroid within normal limits. Lymph nodes: Increased number of lymph nodes seen within the neck bilaterally. Superimposed enlarged bilateral level II lymph nodes measure up to 16 mm on the right and 13 mm on the left. Level III nodes measure up to 11 mm bilaterally. There is an enlarged soft tissue mass at right level IV measuring 6.2 x 4.7 x 4.6 cm, likely a nodal conglomerate (series 2, image 84). An enlarged nodal conglomerate within the right paratracheal region measures approximately 6.0 x 3.6 x 6.5 cm (series 2, image 117). Few additional scattered enlarged mediastinal lymph nodes noted. Few mildly prominent posterior chain nodes measure up to 9 mm on the right. Vascular: Normal intravascular enhancement seen throughout the neck. Limited intracranial: Unremarkable. Visualized orbits: Not included on this exam. Mastoids and visualized paranasal sinuses: Visualized paranasal sinuses are clear. Mastoid air cells and middle ear cavities are largely well pneumatized and free of fluid. Skeleton: No acute osseous finding. No discrete lytic or blastic osseous lesions. Mild cervical spondylolysis noted at C6-7. Upper chest: Remainder of the partially visualized upper chest demonstrates no other acute finding. Partially visualized lungs are clear. Other: None. IMPRESSION: 1. Hazy inflammatory stranding with swelling involving the subcutaneous fat of the lower anterior neck, just inferior to the mandible, suspicious for possible infection/cellulitis. Moderate diffuse retropharyngeal effusion suspected to be reactive. No discrete retropharyngeal abscess. 2.  Prominent periapical lucency about the right maxillary lateral incisor with adjacent inflammatory stranding, concerning for acute odontogenic infection. Superimposed 8 x 12 x 11 mm hypodense collection within this region compatible with early odontogenic abscess. 3. Enlarged nodal conglomerates within the right lower neck and right mediastinum measuring up to 6 cm in diameter. Given size, these are felt to be unlikely be reactive in nature, and are most concerning for possible concomitant lymphoproliferative disorder or nodal metastases. No primary mass lesion seen within the neck. Correlation with histology may be helpful for further evaluation. Additional prominent bilateral cervical lymph nodes as above are more indeterminate, and could be reactive. Electronically Signed   By: Jeannine Boga M.D.   On: 03/04/2019 19:57     Assessment/Plan: Principal Problem:   Retropharyngeal abscess Active Problems:   Depression   Borderline diabetes    This patient was discussed with the ED physician, including pertinent vitals, physical exam findings, labs, and imaging.  We also discussed care given by the ED provider.  1. Retropharyngeal abscess a. Unfortunately, Oral surgery not on call at this time b. Dr Benjamine Mola of ENT consulted - recommended IV antibiotics and consulting Oral surgery in AM. He will also consult c. Continue clindamycin d. Blood cultures obtained e. MRSA screen f. NPO after midnight g. No anticoagulants at this time 2. Odontogenic abscess a. Clindamycin 3. Depression a. Continue home regimen 4. Borderline diabetes a. Check HgA1c  DVT prophylaxis: SCDs Consultants: Oral surgery Code Status: full Family Communication: none  Disposition Plan: home following  clinical improvement   Truett Mainland, DO

## 2019-03-05 NOTE — ED Notes (Signed)
Pt seen here night  Called today with muffled speech and asked if she should return as she was worse  Here for re eval

## 2019-03-05 NOTE — ED Triage Notes (Signed)
Pt evaluated in ED last night for a peritonsillar abscess. Pt reports swelling has moved side of her face to her neck. Pt reports increased difficulty in swallowing. No SOB at this time. Pt states she just took her first dose of augmentin PTA.

## 2019-03-05 NOTE — ED Notes (Signed)
Dr Nehemiah Settle in to assess

## 2019-03-05 NOTE — ED Notes (Signed)
Pt tearful for the last hour Upset that she is going to hospital where a loved one died of cancer 4 months ago  She asks regarding visitation    Please ask Chaplain services  to speak with and to check as her grief is profound  Active listening by this RN with judicious touch to arm while pt cried and spoke of her grief

## 2019-03-05 NOTE — ED Notes (Signed)
Report to [place], [place]

## 2019-03-05 NOTE — ED Notes (Signed)
Bed assigned over an hour  Not ready   Call to [facility] who will inquire as to delay

## 2019-03-05 NOTE — ED Notes (Signed)
Today took clindamycin po from other rx as well as augmentin from last night

## 2019-03-05 NOTE — ED Notes (Signed)
[facility] has picked up and is enroute to [place]   Call to floor for report to Nellie, RN   No answer

## 2019-03-05 NOTE — ED Notes (Signed)
Call to [facility]

## 2019-03-05 NOTE — ED Notes (Signed)
[facility] is enroute

## 2019-03-05 NOTE — ED Notes (Signed)
Call to lab Re: blood cultures

## 2019-03-05 NOTE — ED Notes (Signed)
Bed ready

## 2019-03-05 NOTE — ED Notes (Signed)
Pt on phone speaking with "pharmacist"  Awaiting CT

## 2019-03-05 NOTE — ED Provider Notes (Signed)
[facility] EMERGENCY DEPARTMENT Provider Note   CSN: [id number] Arrival date & time: 03/05/19  1317    History   Chief Complaint Chief Complaint  Patient presents with   Abscess    HPI Tara Aguilar is a 38 y.o. female.     HPI  Pt was seen at 1440. Per pt, c/o gradual onset and worsening of persistent right sided face and neck "swelling" for the past 1 week, worse over the past day. Pt states she had a right upper tooth that broke several weeks ago. States 1 week ago, the right side of her face began to swell, and she was evaluated at [place] she was dx dental infection, rx clindamycin. States initially she "got better and then worse again" 5 days ago. States she called her PMD and was rx prednisone (LD today). Pt states she was not improving, so she came to the ED yesterday for her symptoms. Pt had CT scan without abscess, rx augmentin. Pt states she took her 1st dose today and continues to take the clindamycin TID (as prescribed). Pt states she came back to the ED today because she feels like she is "getting worse." States her right neck continues to "swell" and when she lifts her arms over her head, the pain in her neck increases and she "feels like it's hard to breathe." Denies SOB otherwise. Denies fevers, no CP/palpitations, no rash, no intra-oral edema, no injury, no abd pain, no N/V/D, no dysphagia, no choking/gagging, no hoarse voice, no drooling/stridor.    Past Medical History:  Diagnosis Date   Depression     There are no active problems to display for this patient.   Past Surgical History:  Procedure Laterality Date   CESAREAN SECTION       OB History   No obstetric history on file.      Home Medications    Prior to Admission medications   Medication Sig Start Date End Date Taking? Authorizing Provider  amoxicillin-clavulanate (AUGMENTIN) 875-125 MG tablet Take 1 tablet by mouth 2 (two) times daily. One po bid x 7 days 03/04/19  Yes  Sherwood Gambler, MD  buPROPion (WELLBUTRIN XL) 150 MG 24 hr tablet Take 150 mg by mouth daily.   Yes [provider]  clindamycin (CLEOCIN) 300 MG capsule Take 300 mg by mouth 4 (four) times daily.   Yes [provider]  ibuprofen (ADVIL) 400 MG tablet Take 800 mg by mouth every 6 (six) hours as needed.   Yes [provider]  lidocaine (XYLOCAINE) 2 % solution Use as directed 10 mLs in the mouth or throat 4 (four) times daily. 02/26/19  Yes [provider]  methylPREDNISolone (MEDROL DOSEPAK) 4 MG TBPK tablet Take 4 mg by mouth as directed.   Yes [provider]    Family History No family history on file.  Social History Social History   Tobacco Use   Smoking status: Current Every Day Smoker    Packs/day: 1.00    Years: 13.00    Pack years: 13.00    Types: Cigarettes   Smokeless tobacco: Never Used  Substance Use Topics   Alcohol use: Not on file    Comment: social    Drug use: Not Currently     Allergies   Patient has no known allergies.   Review of Systems Review of Systems ROS: Statement: All systems negative except as marked or noted in the HPI; Constitutional: Negative for fever and chills. ; ; Eyes:  Negative for eye pain, redness and discharge. ; ; ENMT: +face and neck "swelling." Negative for ear pain, hoarseness, nasal congestion, sinus pressure and sore throat. ; ; Cardiovascular: Negative for chest pain, palpitations, diaphoresis, dyspnea and peripheral edema. ; ; Respiratory: Negative for cough, wheezing and stridor. ; ; Gastrointestinal: Negative for nausea, vomiting, diarrhea, abdominal pain, blood in stool, hematemesis, jaundice and rectal bleeding. . ; ; Genitourinary: Negative for dysuria, flank pain and hematuria. ; ; Musculoskeletal: Negative for back pain and neck pain. Negative for swelling and trauma.; ; Skin: Negative for pruritus, rash, abrasions, blisters, bruising and skin lesion.; ; Neuro: Negative for  headache, lightheadedness and neck stiffness. Negative for weakness, altered level of consciousness, altered mental status, extremity weakness, paresthesias, involuntary movement, seizure and syncope.      Physical Exam Updated Vital Signs BP 113/72    Pulse 100    Temp 98.8 F (37.1 C) (Oral)    Resp (!) 29    Ht 5\' 6"  (1.676 m)    Wt 131.5 kg    LMP 02/21/2019 (Approximate)    SpO2 95%    BMI 46.81 kg/m   Physical Exam 1445: Physical examination: Vital signs and O2 SAT: Reviewed; Constitutional: Well developed, Well nourished, Well hydrated, In no acute distress; Head and Face: Normocephalic, Atraumatic. +mild edema right facial cheek compared to left. No rash, no soft tissue crepitus.; Eyes: EOMI, PERRL, No scleral icterus; ENMT: Mouth and pharynx normal, Poor dentition, Widespread dental decay, Left TM normal, Right TM normal, Mucous membranes moist, +upper right 1st molar with significant decay.  No gingival erythema, edema, fluctuance, or drainage.  No intra-oral edema. No submandibular or sublingual edema. No hoarse voice, no drooling, no stridor. No trismus. ;  Neck: Supple, Full range of motion. +large neck with multiple skin folds, +mild edema anterior neck, no rash, no tenderness, no soft tissue crepitus..; Cardiovascular: Regular rate and rhythm, No gallop; Respiratory: Breath sounds clear & equal bilaterally, No wheezes.  Speaking full sentences with ease, Normal respiratory effort/excursion; Chest: Nontender, Movement normal; Abdomen: Soft, Nontender, Nondistended, Normal bowel sounds; Genitourinary: No CVA tenderness; Extremities: Peripheral pulses normal, No tenderness, No edema, No calf edema or asymmetry.; Neuro: AA&Ox3, Major CN grossly intact.  Speech clear. No gross focal motor or sensory deficits in extremities.; Skin: Color normal, Warm, Dry.      ED Treatments / Results  Labs (all labs ordered are listed, but only abnormal results are  displayed)   EKG None  Radiology   Procedures Procedures (including critical care time)  Medications Ordered in ED Medications  clindamycin (CLEOCIN) IVPB 900 mg (0 mg Intravenous Stopped 03/05/19 1531)  iohexol (OMNIPAQUE) 300 MG/ML solution 75 mL (75 mLs Intravenous Contrast Given 03/05/19 1528)     Initial Impression / Assessment and Plan / ED Course  I have reviewed the triage vital signs and the nursing notes.  Pertinent labs & imaging results that were available during my care of the patient were reviewed by me and considered in my medical decision making (see chart for details).     MDM Reviewed: previous chart, nursing note and vitals Reviewed previous: labs and CT scan Interpretation: labs and CT scan    Results for orders placed or performed during the hospital encounter of 75/64/33  Basic metabolic panel  Result Value Ref Range   Sodium 137 135 - 145 mmol/L   Potassium 4.0 3.5 - 5.1 mmol/L   Chloride 103 98 - 111 mmol/L   CO2 24 22 -  32 mmol/L   Glucose, Bld 125 (H) 70 - 99 mg/dL   BUN 19 6 - 20 mg/dL   Creatinine, Ser 0.81 0.44 - 1.00 mg/dL   Calcium 9.2 8.9 - 10.3 mg/dL   GFR calc non Af Amer >60 >60 mL/min   GFR calc Af Amer >60 >60 mL/min   Anion gap 10 5 - 15  Lactic acid, plasma  Result Value Ref Range   Lactic Acid, Venous 1.1 0.5 - 1.9 mmol/L  CBC with Differential  Result Value Ref Range   WBC 12.8 (H) 4.0 - 10.5 K/uL   RBC 4.68 3.87 - 5.11 MIL/uL   Hemoglobin 13.1 12.0 - 15.0 g/dL   HCT 40.9 36.0 - 46.0 %   MCV 87.4 80.0 - 100.0 fL   MCH 28.0 26.0 - 34.0 pg   MCHC 32.0 30.0 - 36.0 g/dL   RDW 14.8 11.5 - 15.5 %   Platelets 322 150 - 400 K/uL   nRBC 0.0 0.0 - 0.2 %   Neutrophils Relative % 75 %   Neutro Abs 9.6 (H) 1.7 - 7.7 K/uL   Lymphocytes Relative 16 %   Lymphs Abs 2.1 0.7 - 4.0 K/uL   Monocytes Relative 6 %   Monocytes Absolute 0.8 0.1 - 1.0 K/uL   Eosinophils Relative 1 %   Eosinophils Absolute 0.1 0.0 - 0.5 K/uL   Basophils  Relative 1 %   Basophils Absolute 0.1 0.0 - 0.1 K/uL   Immature Granulocytes 1 %   Abs Immature Granulocytes 0.15 (H) 0.00 - 0.07 K/uL   Dg Chest 2 View Result Date: 03/05/2019 CLINICAL DATA:  Facial swelling and known peritonsillar abscess EXAM: CHEST - 2 VIEW COMPARISON:  None. FINDINGS: Cardiac shadows within normal limits. Lungs are well aerated bilaterally. Fullness in the right paratracheal region is noted consistent with the lymphadenopathy seen on recent CT of the neck. No focal infiltrate or effusion is seen. No acute bony abnormality is noted. IMPRESSION: Right paratracheal lymphadenopathy similar to that seen on recent CT examination. No other focal abnormality is noted. Electronically Signed   By: Inez Catalina M.D.   On: 03/05/2019 15:58    Ct Soft Tissue Neck W Contrast Result Date: 03/05/2019 CLINICAL DATA:  Worsening facial and neck swelling with difficulty swallowing. EXAM: CT NECK WITH CONTRAST TECHNIQUE: Multidetector CT imaging of the neck was performed using the standard protocol following the bolus administration of intravenous contrast. CONTRAST:  37mL OMNIPAQUE IOHEXOL 300 MG/ML  SOLN COMPARISON:  03/04/2019 FINDINGS: Pharynx and larynx: Symmetric pharyngeal soft tissues without evidence of mass. Diffuse retropharyngeal fluid has increased and now measures up to 2.4 cm in AP thickness at the C3 level with mild mass effect on the posterior oropharynx. No rim enhancement of this fluid. No peritonsillar abscess. Patent airway. Unremarkable larynx. Salivary glands: No inflammation, mass, or stone. Thyroid: Unremarkable. Lymph nodes: Unchanged bilateral cervical lymphadenopathy including level IIa lymph nodes measuring up to 16 mm in short axis on the right and 14 mm on the left as well as a 6.1 x 4.8 cm nodal conglomerate on the right level IV. Similar appearance of partially visualized mediastinal lymphadenopathy including a 6.4 x 4.1 cm right paratracheal mass. Vascular: Major vascular  structures in the neck are grossly patent although the right internal jugular vein appears compressed in the lower neck by the nodal mass and is poorly visualized near the confluence with the subclavian vein, partly due to patient body habitus. Limited intracranial: Unremarkable. Visualized orbits: Not imaged.  Mastoids and visualized paranasal sinuses: Small left maxillary sinus mucous retention cyst. Clear mastoid air cells. Skeleton: Mild lower cervical spondylosis at C6-7. Advanced facet arthrosis in the included thoracic spine. No suspicious osseous lesion. Upper chest: Clear lung apices. Other: Multiple dental caries and periapical lucencies including a prominent periapical lucency involving the right lateral maxillary incisor with disruption of the buccal cortex of the alveolar ridge with an unchanged overlying 8 x 6 mm fluid collection and similar appearance of overlying soft tissue inflammation. Fat stranding more inferiorly in the lower face, right greater than left with asymmetric thickening of the platysma on the right, is stable to slightly increased. IMPRESSION: 1. Odontogenic infection with unchanged 8 x 6 mm subperiosteal abscess associated with the right maxillary incisor. 2. Increased retropharyngeal fluid with mild mass effect. While there is no rim enhancement and a sterile effusion is possible, retropharyngeal abscess is a concern given its size and progression since yesterday. 3. Mildly increased inflammatory stranding/swelling in the lower face and neck without new fluid collection. 4. Unchanged cervical and mediastinal lymphadenopathy including large conglomerate nodal masses in the right lower neck and superior mediastinum concerning for metastatic disease or lymphoproliferative disease. Electronically Signed   By: Logan Bores M.D.   On: 03/05/2019 16:15     Results for Tara Aguilar, Tara Aguilar (MRN [id number]) as of 03/05/2019 16:11  Ref. Range 03/04/2019 15:45 03/05/2019 14:10  WBC Latest Ref Range: 4.0  - 10.5 K/uL 16.6 (H) 12.8 (H)      Tara Aguilar was evaluated in Emergency Department on 03/05/2019 for the symptoms described in the history of present illness. She was evaluated in the context of the global COVID-19 pandemic, which necessitated consideration that the patient might be at risk for infection with the SARS-CoV-2 virus that causes COVID-19. Institutional protocols and algorithms that pertain to the evaluation of patients at risk for COVID-19 are in a state of rapid change based on information released by regulatory bodies including the CDC and federal and state organizations. These policies and algorithms were followed during the patient's care in the ED.       1645:  WBC count improving with normal lactic acid x2. Pt remains afebrile, resps easy, airway intact. No drooling/stridor. CT scan as above (new retropharyngeal fluid with mild mass effect). IV clindamycin already given. No Oral Surgery on call at this facility. T/C returned from [facility] ENT Dr. Benjamine Mola, case discussed, including:  HPI, pertinent PM/SHx, VS/PE, dx testing, ED course and treatment: he has viewed the CT images, states no acute surgical issue at this time, but pt does need admission to the hospital for IV abx, he can consult tomorrow, pt will also need Oral Surgery consult. Dx and testing d/w pt.  Questions answered.  Verb understanding, agreeable to transfer to [facility] for admit.  1705:  T/C returned from [facility]. Nehemiah Settle, case discussed, including:  HPI, pertinent PM/SHx, VS/PE, dx testing, ED course and treatment, as well as d/w ENT MD:  Agreeable to transfer to [facility] for admission.   1850:  T/C returned from [facility] Oral Surgery Dr. Benson Norway, case discussed, including:  HPI, pertinent PM/SHx, VS/PE, dx testing, ED course and treatment:  States he will view the CT images, and is aware he will receive [facility] consult at [facility] after pt transferred.        Final Clinical Impressions(s) / ED Diagnoses   Final diagnoses:  None     ED Discharge Orders    None  Francine Graven, DO 03/11/19 [phone]

## 2019-03-05 NOTE — ED Notes (Signed)
Call to floor to ascertain if they want report now or later   Request to call after shift change and give report to Nellie, RN  Request by this RN for Nellie RN to call here when ready

## 2019-03-05 NOTE — ED Notes (Signed)
Pt in CT.

## 2019-03-05 NOTE — ED Notes (Signed)
Swelling noted to chin area as well as to R side of face  Pt is clear voiced states she is uncomfortable as she cannot lift arms over her head and breathe  When asked if she has a dentist, reports that not one she sees regularly Reports tooth was broken prior to abscess

## 2019-03-05 NOTE — ED Notes (Signed)
[facility] report

## 2019-03-05 NOTE — ED Notes (Signed)
Dr Jerilynn Mages in to speak w pt regarding results

## 2019-03-05 NOTE — ED Notes (Signed)
Pt seen here yesterday for abscess   Pt reports gum pain several weeks ago at a broken tooth that has been broken for some time  Swelling noted to face Seen last week at [facility] ED By PCP in [place]  Here yesterday  Returns her for rreeval due to feeling worse with increased swelling   States I have been dealing with this for several weeks

## 2019-03-05 NOTE — ED Notes (Signed)
Pt tearful but quiet

## 2019-03-06 DIAGNOSIS — J390 Retropharyngeal and parapharyngeal abscess: Secondary | ICD-10-CM

## 2019-03-06 LAB — BASIC METABOLIC PANEL
Anion gap: 12 (ref 5–15)
BUN: 12 mg/dL (ref 6–20)
CO2: 28 mmol/L (ref 22–32)
Calcium: 9.2 mg/dL (ref 8.9–10.3)
Chloride: 95 mmol/L — ABNORMAL LOW (ref 98–111)
Creatinine, Ser: 0.94 mg/dL (ref 0.44–1.00)
GFR calc Af Amer: >60 mL/min (ref >60)
GFR calc non Af Amer: >60 mL/min (ref >60)
Glucose, Bld: 102 mg/dL — ABNORMAL HIGH (ref 70–99)
Potassium: 3.8 mmol/L (ref 3.5–5.1)
Sodium: 135 mmol/L (ref 135–145)

## 2019-03-06 LAB — GLUCOSE, CAPILLARY
Glucose-Capillary: 87 mg/dL (ref 70–99)
Glucose-Capillary: 95 mg/dL (ref 70–99)

## 2019-03-06 LAB — HIV ANTIBODY (ROUTINE TESTING W REFLEX): HIV Screen 4th Generation wRfx: NONREACTIVE

## 2019-03-06 LAB — CBC
HCT: 40.7 % (ref 36.0–46.0)
Hemoglobin: 13 g/dL (ref 12.0–15.0)
MCH: 27.7 pg (ref 26.0–34.0)
MCHC: 31.9 g/dL (ref 30.0–36.0)
MCV: 86.6 fL (ref 80.0–100.0)
Platelets: 345 10*3/uL (ref 150–400)
RBC: 4.7 MIL/uL (ref 3.87–5.11)
RDW: 14.6 % (ref 11.5–15.5)
WBC: 15.4 10*3/uL — ABNORMAL HIGH (ref 4.0–10.5)
nRBC: 0 % (ref 0.0–0.2)

## 2019-03-06 MED ORDER — CLINDAMYCIN PHOSPHATE 600 MG/50ML IV SOLN
600.0000 mg | Freq: Three times a day (TID) | INTRAVENOUS | Status: DC
  Administered 2019-03-06 – 2019-03-08 (×7): 600 mg via INTRAVENOUS
  Filled 2019-03-06 (×7): qty 50

## 2019-03-06 NOTE — Progress Notes (Signed)
PROGRESS NOTE    Tara Aguilar  NOM:[id number] DOB: 1981/03/21 DOA: 03/05/2019 PCP: [facility]    Brief Narrative; 38 year old with past medical history significant for depression who was recently seen for right side face and neck swelling that is started about a week prior to admission.  She was seen at [facility] on the ED and was treated with clindamycin and IV steroids.  She was referred her to a dentist for an abscessed tooth.  Patient reported that she started to have purulent drainage in her mouth and had some anterior neck swelling.  She was referred by telemedicine to the ED for further evaluation.  A CT done showed odontogenic abscess with overlying cellulitis.  He was then prescribed Augmentin and discharged home.  She returned again complaining of worsening the swelling of her neck.  Repeat a CT scan showed unchanged odontogenic abscess but increased retropharyngeal fluid with mass-effect concerning for developing retropharyngeal abscess.  Patient was admitted for IV antibiotics and further evaluation by ENT and dentist oral surgeon.  Assessment & Plan:   Principal Problem:   Retropharyngeal abscess Active Problems:   Depression   Borderline diabetes  1-odontogenic abscess: Continue with IV clindamycin. Discussed case with oral surgeon Dr.Oswley.  He discussed with Dr. Lawana Chambers the case and patient will be evaluated by Dr. Dorothyann Gibbs in the morning. N.p.o. after midnight for tooth extraction. I have left a message voicemail of Dr. Lawana Chambers office. Blood culture no growth today.  2-retropharyngeal abscess; patient was evaluated by ENT Dr. Palma Holter. He is recommending IV antibiotics and oral surgeon dentist evaluation and treatment of odontogenic abscess.  Per Dr. Leonarda Salon retropharyngeal area is more consistent with effusion. Continue with IV clindamycin  3-neck lymphadenopathy: After patient recovered from this infection she will need a follow-up for this  lymphadenopathy.  4-depression: Continue with home medication. Borderline diabetes Check hemoglobin A1c  Estimated body mass index is 46.81 kg/m as calculated from the following:   Height as of this encounter: 5\' 6"  (1.676 m).   Weight as of this encounter: 131.5 kg.   DVT prophylaxis: SCD Code Status: full code Family Communication: care discussed with patinet Disposition Plan: remain in the hospital with IV antibiotics, and will required treatment of odontogenic abscess.   Consultants:   Dr Benjamine Mola  Dr Lauraine Rinne  Dr Nemiah Commander   Procedures:   none  Antimicrobials:  Clindamycin.   Subjective: Complaining of neck pain, mouth pain.  She report some difficulty swallowing, denies dyspnea.   Objective: Vitals:   03/05/19 1900 03/05/19 1915 03/05/19 2021 03/06/19 0440  BP: 128/80  (!) 143/80 133/71  Pulse:  91 80 92  Resp: 20  14 14   Temp:   98.3 F (36.8 C) 97.9 F (36.6 C)  TempSrc:   Oral Oral  SpO2:  97% 94% 100%  Weight:      Height:        Intake/Output Summary (Last 24 hours) at 03/06/2019 0844 Last data filed at 03/06/2019 0500 Gross per 24 hour  Intake 940 ml  Output --  Net 940 ml   Filed Weights   03/05/19 1326  Weight: 131.5 kg    Examination:  General exam: Appears calm and comfortable , neck swelling, poor dentition.  Respiratory system: Clear to auscultation. Respiratory effort normal. Cardiovascular system: S1 & S2 heard, RRR. No JVD, murmurs, rubs, gallops or clicks. No pedal edema. Gastrointestinal system: Abdomen is nondistended, soft and nontender. No organomegaly or masses felt. Normal bowel sounds heard. Central nervous system:  Alert and oriented. No focal neurological deficits. Extremities: Symmetric 5 x 5 power. Skin: No rashes, lesions or ulcers Psychiatry: Judgement and insight appear normal. Mood & affect appropriate.     Data Reviewed: I have personally reviewed following labs and imaging studies  CBC: Recent Labs  Lab  03/04/19 1545 03/05/19 1410 03/06/19 0217  WBC 16.6* 12.8* 15.4*  NEUTROABS 12.3* 9.6*  --   HGB 13.0 13.1 13.0  HCT 41.0 40.9 40.7  MCV 87.4 87.4 86.6  PLT 349 322 742   Basic Metabolic Panel: Recent Labs  Lab 03/04/19 1545 03/05/19 1410 03/06/19 0217  NA 137 137 135  K 4.5 4.0 3.8  CL 103 103 95*  CO2 24 24 28   GLUCOSE 113* 125* 102*  BUN 24* 19 12  CREATININE 0.80 0.81 0.94  CALCIUM 9.4 9.2 9.2   GFR: Estimated Creatinine Clearance: 111.9 mL/min (by C-G formula based on SCr of 0.94 mg/dL). Liver Function Tests: No results for input(s): AST, ALT, ALKPHOS, BILITOT, PROT, ALBUMIN in the last 168 hours. No results for input(s): LIPASE, AMYLASE in the last 168 hours. No results for input(s): AMMONIA in the last 168 hours. Coagulation Profile: No results for input(s): INR, PROTIME in the last 168 hours. Cardiac Enzymes: No results for input(s): CKTOTAL, CKMB, CKMBINDEX, TROPONINI in the last 168 hours. BNP (last 3 results) No results for input(s): PROBNP in the last 8760 hours. HbA1C: No results for input(s): HGBA1C in the last 72 hours. CBG: Recent Labs  Lab 03/06/19 0049 03/06/19 0642  GLUCAP 87 95   Lipid Profile: No results for input(s): CHOL, HDL, LDLCALC, TRIG, CHOLHDL, LDLDIRECT in the last 72 hours. Thyroid Function Tests: No results for input(s): TSH, T4TOTAL, FREET4, T3FREE, THYROIDAB in the last 72 hours. Anemia Panel: No results for input(s): VITAMINB12, FOLATE, FERRITIN, TIBC, IRON, RETICCTPCT in the last 72 hours. Sepsis Labs: Recent Labs  Lab 03/05/19 1440 03/05/19 1606  LATICACIDVEN 1.1 1.1    Recent Results (from the past 240 hour(s))  SARS Coronavirus 2 (CEPHEID - Performed in [facility] hospital lab), Hosp Order     Status: None   Collection Time: 03/05/19  5:10 PM  Result Value Ref Range Status   SARS Coronavirus 2 NEGATIVE NEGATIVE Final    Comment: (NOTE) If result is NEGATIVE SARS-CoV-2 target nucleic acids are NOT  DETECTED. The SARS-CoV-2 RNA is generally detectable in upper and lower  respiratory specimens during the acute phase of infection. The lowest  concentration of SARS-CoV-2 viral copies this assay can detect is 250  copies / mL. A negative result does not preclude SARS-CoV-2 infection  and should not be used as the sole basis for treatment or other  patient management decisions.  A negative result may occur with  improper specimen collection / handling, submission of specimen other  than nasopharyngeal swab, presence of viral mutation(s) within the  areas targeted by this assay, and inadequate number of viral copies  (<250 copies / mL). A negative result must be combined with clinical  observations, patient history, and epidemiological information. If result is POSITIVE SARS-CoV-2 target nucleic acids are DETECTED. The SARS-CoV-2 RNA is generally detectable in upper and lower  respiratory specimens dur ing the acute phase of infection.  Positive  results are indicative of active infection with SARS-CoV-2.  Clinical  correlation with patient history and other diagnostic information is  necessary to determine patient infection status.  Positive results do  not rule out bacterial infection or co-infection with other viruses. If  result is PRESUMPTIVE POSTIVE SARS-CoV-2 nucleic acids MAY BE PRESENT.   A presumptive positive result was obtained on the submitted specimen  and confirmed on repeat testing.  While 2019 novel coronavirus  (SARS-CoV-2) nucleic acids may be present in the submitted sample  additional confirmatory testing may be necessary for epidemiological  and / or clinical management purposes  to differentiate between  SARS-CoV-2 and other Sarbecovirus currently known to infect humans.  If clinically indicated additional testing with an alternate test  methodology [phone]) is advised. The SARS-CoV-2 RNA is generally  detectable in upper and lower respiratory sp ecimens during  the acute  phase of infection. The expected result is Negative. Fact Sheet for Patients:  StrictlyIdeas.no Fact Sheet for Healthcare Providers: BankingDealers.co.za This test is not yet approved or cleared by the [place] FDA and has been authorized for detection and/or diagnosis of SARS-CoV-2 by FDA under an Emergency Use Authorization (EUA).  This EUA will remain in effect (meaning this test can be used) for the duration of the COVID-19 declaration under Section 564(b)(1) of the Act, 21 U.S.C. section 360bbb-3(b)(1), unless the authorization is terminated or revoked sooner. Performed at [facility], [address] [place], [place] [address]   MRSA PCR Screening     Status: None   Collection Time: 03/05/19  5:15 PM  Result Value Ref Range Status   MRSA by PCR NEGATIVE NEGATIVE Final    Comment:        The GeneXpert MRSA Assay (FDA approved for NASAL specimens only), is one component of a comprehensive MRSA colonization surveillance program. It is not intended to diagnose MRSA infection nor to guide or monitor treatment for MRSA infections. Performed at [facility], [address] [place], [place] [address]   Culture, blood (routine x 2)     Status: None (Preliminary result)   Collection Time: 03/05/19  6:38 PM  Result Value Ref Range Status   Specimen Description RIGHT ANTECUBITAL  Final   Special Requests   Final    BOTTLES DRAWN AEROBIC AND ANAEROBIC Blood Culture adequate volume   Culture   Final    NO GROWTH < 12 HOURS Performed at [facility], [address], [place], [place] [address]    Report Status PENDING  Incomplete  Culture, blood (routine x 2)     Status: None (Preliminary result)   Collection Time: 03/05/19  6:42 PM  Result Value Ref Range Status   Specimen Description LEFT ANTECUBITAL  Final   Special Requests   Final    BOTTLES DRAWN AEROBIC AND ANAEROBIC Blood Culture adequate volume   Culture    Final    NO GROWTH < 12 HOURS Performed at [facility], [address], [place], [place] [address]    Report Status PENDING  Incomplete         Radiology Studies: Dg Chest 2 View  Result Date: 03/05/2019 CLINICAL DATA:  Facial swelling and known peritonsillar abscess EXAM: CHEST - 2 VIEW COMPARISON:  None. FINDINGS: Cardiac shadows within normal limits. Lungs are well aerated bilaterally. Fullness in the right paratracheal region is noted consistent with the lymphadenopathy seen on recent CT of the neck. No focal infiltrate or effusion is seen. No acute bony abnormality is noted. IMPRESSION: Right paratracheal lymphadenopathy similar to that seen on recent CT examination. No other focal abnormality is noted. Electronically Signed   By: Inez Catalina M.D.   On: 03/05/2019 15:58   Ct Soft Tissue Neck W Contrast  Result Date: 03/05/2019  CLINICAL DATA:  Worsening facial and neck swelling with difficulty swallowing. EXAM: CT NECK WITH CONTRAST TECHNIQUE: Multidetector CT imaging of the neck was performed using the standard protocol following the bolus administration of intravenous contrast. CONTRAST:  42mL OMNIPAQUE IOHEXOL 300 MG/ML  SOLN COMPARISON:  03/04/2019 FINDINGS: Pharynx and larynx: Symmetric pharyngeal soft tissues without evidence of mass. Diffuse retropharyngeal fluid has increased and now measures up to 2.4 cm in AP thickness at the C3 level with mild mass effect on the posterior oropharynx. No rim enhancement of this fluid. No peritonsillar abscess. Patent airway. Unremarkable larynx. Salivary glands: No inflammation, mass, or stone. Thyroid: Unremarkable. Lymph nodes: Unchanged bilateral cervical lymphadenopathy including level IIa lymph nodes measuring up to 16 mm in short axis on the right and 14 mm on the left as well as a 6.1 x 4.8 cm nodal conglomerate on the right level IV. Similar appearance of partially visualized mediastinal lymphadenopathy including a 6.4 x 4.1 cm right  paratracheal mass. Vascular: Major vascular structures in the neck are grossly patent although the right internal jugular vein appears compressed in the lower neck by the nodal mass and is poorly visualized near the confluence with the subclavian vein, partly due to patient body habitus. Limited intracranial: Unremarkable. Visualized orbits: Not imaged. Mastoids and visualized paranasal sinuses: Small left maxillary sinus mucous retention cyst. Clear mastoid air cells. Skeleton: Mild lower cervical spondylosis at C6-7. Advanced facet arthrosis in the included thoracic spine. No suspicious osseous lesion. Upper chest: Clear lung apices. Other: Multiple dental caries and periapical lucencies including a prominent periapical lucency involving the right lateral maxillary incisor with disruption of the buccal cortex of the alveolar ridge with an unchanged overlying 8 x 6 mm fluid collection and similar appearance of overlying soft tissue inflammation. Fat stranding more inferiorly in the lower face, right greater than left with asymmetric thickening of the platysma on the right, is stable to slightly increased. IMPRESSION: 1. Odontogenic infection with unchanged 8 x 6 mm subperiosteal abscess associated with the right maxillary incisor. 2. Increased retropharyngeal fluid with mild mass effect. While there is no rim enhancement and a sterile effusion is possible, retropharyngeal abscess is a concern given its size and progression since yesterday. 3. Mildly increased inflammatory stranding/swelling in the lower face and neck without new fluid collection. 4. Unchanged cervical and mediastinal lymphadenopathy including large conglomerate nodal masses in the right lower neck and superior mediastinum concerning for metastatic disease or lymphoproliferative disease. Electronically Signed   By: Logan Bores M.D.   On: 03/05/2019 16:15   Ct Soft Tissue Neck W Contrast  Result Date: 03/04/2019 CLINICAL DATA:  Initial evaluation  for acute anterior neck swelling, neck mass. Sore throat. EXAM: CT NECK WITH CONTRAST TECHNIQUE: Multidetector CT imaging of the neck was performed using the standard protocol following the bolus administration of intravenous contrast. CONTRAST:  85mL OMNIPAQUE IOHEXOL 300 MG/ML  SOLN COMPARISON:  None available. FINDINGS: Pharynx and larynx: Oral cavity within normal limits without discrete mass or loculated fluid collection. Prominent periapical lucency with dehiscence of the overlying alveolar ridge present at the right maxillary lateral incisor. Overlying soft tissue swelling with inflammatory stranding concerning for odontogenic infection. Ill-defined hypodense collection within this region measuring 8 x 12 x 11 mm suspicious for early/developing odontogenic abscess (series 2, image 7). Additional hazy inflammatory stranding seen involving the subcutaneous fat inferiorly within the anterior neck, just inferior to the mandible (series 2, image 53). Associated thickening of the platysmas, right greater than left. Findings  suspicious for additional site of infection/cellulitis. No base of tongue lesion. Palatine tonsils symmetric and within normal limits. Nasopharynx within normal limits. Moderate retropharyngeal effusion seen diffusely throughout the retropharyngeal soft tissues. No loculated collection or retropharyngeal abscess. Lateral extension into the carotid spaces bilaterally. Mild mass effect on the hypopharynx anteriorly. Supraglottic airway remains patent at this time. No significant associated mucosal edema. Epiglottis normal. Vallecula clear. Remainder of the hypopharynx and supraglottic larynx within normal limits. True cords symmetric and grossly normal. Subglottic airway clear. Salivary glands: Salivary glands including the parotid and submandibular glands are normal. Thyroid: Thyroid within normal limits. Lymph nodes: Increased number of lymph nodes seen within the neck bilaterally. Superimposed  enlarged bilateral level II lymph nodes measure up to 16 mm on the right and 13 mm on the left. Level III nodes measure up to 11 mm bilaterally. There is an enlarged soft tissue mass at right level IV measuring 6.2 x 4.7 x 4.6 cm, likely a nodal conglomerate (series 2, image 84). An enlarged nodal conglomerate within the right paratracheal region measures approximately 6.0 x 3.6 x 6.5 cm (series 2, image 117). Few additional scattered enlarged mediastinal lymph nodes noted. Few mildly prominent posterior chain nodes measure up to 9 mm on the right. Vascular: Normal intravascular enhancement seen throughout the neck. Limited intracranial: Unremarkable. Visualized orbits: Not included on this exam. Mastoids and visualized paranasal sinuses: Visualized paranasal sinuses are clear. Mastoid air cells and middle ear cavities are largely well pneumatized and free of fluid. Skeleton: No acute osseous finding. No discrete lytic or blastic osseous lesions. Mild cervical spondylolysis noted at C6-7. Upper chest: Remainder of the partially visualized upper chest demonstrates no other acute finding. Partially visualized lungs are clear. Other: None. IMPRESSION: 1. Hazy inflammatory stranding with swelling involving the subcutaneous fat of the lower anterior neck, just inferior to the mandible, suspicious for possible infection/cellulitis. Moderate diffuse retropharyngeal effusion suspected to be reactive. No discrete retropharyngeal abscess. 2. Prominent periapical lucency about the right maxillary lateral incisor with adjacent inflammatory stranding, concerning for acute odontogenic infection. Superimposed 8 x 12 x 11 mm hypodense collection within this region compatible with early odontogenic abscess. 3. Enlarged nodal conglomerates within the right lower neck and right mediastinum measuring up to 6 cm in diameter. Given size, these are felt to be unlikely be reactive in nature, and are most concerning for possible concomitant  lymphoproliferative disorder or nodal metastases. No primary mass lesion seen within the neck. Correlation with histology may be helpful for further evaluation. Additional prominent bilateral cervical lymph nodes as above are more indeterminate, and could be reactive. Electronically Signed   By: Jeannine Boga M.D.   On: 03/04/2019 19:57        Scheduled Meds:  buPROPion  150 mg Oral Daily   Continuous Infusions:  sodium chloride Stopped (03/06/19 0052)   clindamycin (CLEOCIN) IV 600 mg (03/06/19 0210)     LOS: 1 day    Time spent: 35 minutes.     Elmarie Shiley, MD [facility] Pager [phone]  If 7PM-7AM, please contact night-coverage [email or web address] Password [facility] 03/06/2019, 8:44 AM

## 2019-03-06 NOTE — Consult Note (Signed)
Reason for Consult: Odontogenic abscess, retropharyngeal effusion Referring Physician: Francine Graven, DO  HPI:  Tara Aguilar is an 38 y.o. female who was admitted yesterday for treatment of her odontogenic abscess and retropharyngeal effusion. The patient first developed facial swelling and dental pain 1 week ago. She was seen at [facility] ED and was treated with Clindamycin and IV steroids. She was told that she needed to see a dentist for an abscessed tooth. Her symptoms improved a little, but began to have purulent drainage in her mouth and had some anterior neck swelling. She talked with a telemedicine provider, who recommended being seen at the ED. She had a CT done, which showed an odontogenic abscess of her right maxillary incisor and overlying cellulitis. She was given a prescription for augmentin and discharged to home. She had worsening swelling and returned to [facility]aterday.  Her repeat CT also showed retropharyngeal effusion, but without airway obstruction.   Past Medical History:  Diagnosis Date  . Depression     Past Surgical History:  Procedure Laterality Date  . CESAREAN SECTION      No family history on file.  Social History:  reports that she has been smoking cigarettes. She has a 13.00 pack-year smoking history. She has never used smokeless tobacco. She reports previous drug use. No history on file for alcohol.  Allergies: No Known Allergies  Prior to Admission medications   Medication Sig Start Date End Date Taking? Authorizing Provider  amoxicillin-clavulanate (AUGMENTIN) 875-125 MG tablet Take 1 tablet by mouth 2 (two) times daily. One po bid x 7 days 03/04/19  Yes Sherwood Gambler, MD  buPROPion (WELLBUTRIN XL) 150 MG 24 hr tablet Take 150 mg by mouth daily.   Yes [provider]  clindamycin (CLEOCIN) 300 MG capsule Take 300 mg by mouth 4 (four) times daily.   Yes [provider]  ibuprofen (ADVIL) 400 MG tablet Take 800 mg by  mouth every 6 (six) hours as needed.   Yes [provider]  lidocaine (XYLOCAINE) 2 % solution Use as directed 10 mLs in the mouth or throat 4 (four) times daily. 02/26/19  Yes [provider]  methylPREDNISolone (MEDROL DOSEPAK) 4 MG TBPK tablet Take 4 mg by mouth as directed.   Yes [provider]    Medications:  I have reviewed the patient's current medications. Scheduled: . buPROPion  150 mg Oral Daily   Continuous: . sodium chloride Stopped (03/06/19 0052)  . clindamycin (CLEOCIN) IV 600 mg (03/06/19 0919)    Results for orders placed or performed during the hospital encounter of 03/05/19 (from the past 48 hour(s))  Basic metabolic panel     Status: Abnormal   Collection Time: 03/05/19  2:10 PM  Result Value Ref Range   Sodium 137 135 - 145 mmol/L   Potassium 4.0 3.5 - 5.1 mmol/L   Chloride 103 98 - 111 mmol/L   CO2 24 22 - 32 mmol/L   Glucose, Bld 125 (H) 70 - 99 mg/dL   BUN 19 6 - 20 mg/dL   Creatinine, Ser 0.81 0.44 - 1.00 mg/dL   Calcium 9.2 8.9 - 10.3 mg/dL   GFR calc non Af Amer >60 >60 mL/min   GFR calc Af Amer >60 >60 mL/min   Anion gap 10 5 - 15    Comment: Performed at [facility], [address], [place], [place] [address]  CBC with Differential     Status: Abnormal   Collection Time: 03/05/19  2:10  PM  Result Value Ref Range   WBC 12.8 (H) 4.0 - 10.5 K/uL   RBC 4.68 3.87 - 5.11 MIL/uL   Hemoglobin 13.1 12.0 - 15.0 g/dL   HCT 40.9 36.0 - 46.0 %   MCV 87.4 80.0 - 100.0 fL   MCH 28.0 26.0 - 34.0 pg   MCHC 32.0 30.0 - 36.0 g/dL   RDW 14.8 11.5 - 15.5 %   Platelets 322 150 - 400 K/uL   nRBC 0.0 0.0 - 0.2 %   Neutrophils Relative % 75 %   Neutro Abs 9.6 (H) 1.7 - 7.7 K/uL   Lymphocytes Relative 16 %   Lymphs Abs 2.1 0.7 - 4.0 K/uL   Monocytes Relative 6 %   Monocytes Absolute 0.8 0.1 - 1.0 K/uL   Eosinophils Relative 1 %   Eosinophils Absolute 0.1 0.0 - 0.5 K/uL   Basophils Relative 1 %   Basophils Absolute 0.1 0.0 - 0.1 K/uL    Immature Granulocytes 1 %   Abs Immature Granulocytes 0.15 (H) 0.00 - 0.07 K/uL    Comment: Performed at [facility], [address] [place], [place], [place] [address]  Lactic acid, plasma     Status: None   Collection Time: 03/05/19  2:40 PM  Result Value Ref Range   Lactic Acid, Venous 1.1 0.5 - 1.9 mmol/L    Comment: Performed at [facility], [address], [place], [place] [address]  Lactic acid, plasma     Status: None   Collection Time: 03/05/19  4:06 PM  Result Value Ref Range   Lactic Acid, Venous 1.1 0.5 - 1.9 mmol/L    Comment: Performed at [facility], [address], [place], [place] [address]  SARS Coronavirus 2 (CEPHEID - Performed in [place] hospital lab), Hosp Order     Status: None   Collection Time: 03/05/19  5:10 PM  Result Value Ref Range   SARS Coronavirus 2 NEGATIVE NEGATIVE    Comment: (NOTE) If result is NEGATIVE SARS-CoV-2 target nucleic acids are NOT DETECTED. The SARS-CoV-2 RNA is generally detectable in upper and lower  respiratory specimens during the acute phase of infection. The lowest  concentration of SARS-CoV-2 viral copies this assay can detect is 250  copies / mL. A negative result does not preclude SARS-CoV-2 infection  and should not be used as the sole basis for treatment or other  patient management decisions.  A negative result may occur with  improper specimen collection / handling, submission of specimen other  than nasopharyngeal swab, presence of viral mutation(s) within the  areas targeted by this assay, and inadequate number of viral copies  (<250 copies / mL). A negative result must be combined with clinical  observations, patient history, and epidemiological information. If result is POSITIVE SARS-CoV-2 target nucleic acids are DETECTED. The SARS-CoV-2 RNA is generally detectable in upper and lower  respiratory specimens dur ing the acute phase of infection.  Positive  results are indicative of active infection with SARS-CoV-2.   Clinical  correlation with patient history and other diagnostic information is  necessary to determine patient infection status.  Positive results do  not rule out bacterial infection or co-infection with other viruses. If result is PRESUMPTIVE POSTIVE SARS-CoV-2 nucleic acids MAY BE PRESENT.   A presumptive positive result was obtained on the submitted specimen  and confirmed on repeat testing.  While 2019 novel coronavirus  (SARS-CoV-2) nucleic acids may be present in the submitted sample  additional confirmatory testing may be necessary for  epidemiological  and / or clinical management purposes  to differentiate between  SARS-CoV-2 and other Sarbecovirus currently known to infect humans.  If clinically indicated additional testing with an alternate test  methodology [phone]) is advised. The SARS-CoV-2 RNA is generally  detectable in upper and lower respiratory sp ecimens during the acute  phase of infection. The expected result is Negative. Fact Sheet for Patients:  StrictlyIdeas.no Fact Sheet for Healthcare Providers: BankingDealers.co.za This test is not yet approved or cleared by the [place] FDA and has been authorized for detection and/or diagnosis of SARS-CoV-2 by FDA under an Emergency Use Authorization (EUA).  This EUA will remain in effect (meaning this test can be used) for the duration of the COVID-19 declaration under Section 564(b)(1) of the Act, 21 U.S.C. section 360bbb-3(b)(1), unless the authorization is terminated or revoked sooner. Performed at [facility], [address], [place], [place] [address]   MRSA PCR Screening     Status: None   Collection Time: 03/05/19  5:15 PM  Result Value Ref Range   MRSA by PCR NEGATIVE NEGATIVE    Comment:        The GeneXpert MRSA Assay (FDA approved for NASAL specimens only), is one component of a comprehensive MRSA colonization surveillance program. It is not intended  to diagnose MRSA infection nor to guide or monitor treatment for MRSA infections. Performed at [facility], [address], [place], [place] [address]   Culture, blood (routine x 2)     Status: None (Preliminary result)   Collection Time: 03/05/19  6:38 PM  Result Value Ref Range   Specimen Description RIGHT ANTECUBITAL    Special Requests      BOTTLES DRAWN AEROBIC AND ANAEROBIC Blood Culture adequate volume   Culture      NO GROWTH < 12 HOURS Performed at [facility], [address] [place], [place] [address]    Report Status PENDING   Culture, blood (routine x 2)     Status: None (Preliminary result)   Collection Time: 03/05/19  6:42 PM  Result Value Ref Range   Specimen Description LEFT ANTECUBITAL    Special Requests      BOTTLES DRAWN AEROBIC AND ANAEROBIC Blood Culture adequate volume   Culture      NO GROWTH < 12 HOURS Performed at [facility], [address] [place], [place] [address]    Report Status PENDING   Glucose, capillary     Status: None   Collection Time: 03/06/19 12:49 AM  Result Value Ref Range   Glucose-Capillary 87 70 - 99 mg/dL  CBC     Status: Abnormal   Collection Time: 03/06/19  2:17 AM  Result Value Ref Range   WBC 15.4 (H) 4.0 - 10.5 K/uL   RBC 4.70 3.87 - 5.11 MIL/uL   Hemoglobin 13.0 12.0 - 15.0 g/dL   HCT 40.7 36.0 - 46.0 %   MCV 86.6 80.0 - 100.0 fL   MCH 27.7 26.0 - 34.0 pg   MCHC 31.9 30.0 - 36.0 g/dL   RDW 14.6 11.5 - 15.5 %   Platelets 345 150 - 400 K/uL   nRBC 0.0 0.0 - 0.2 %    Comment: Performed at [facility], [place] [address] [place],  [address]  Basic metabolic panel     Status: Abnormal   Collection Time: 03/06/19  2:17 AM  Result Value Ref Range   Sodium 135 135 - 145 mmol/L   Potassium 3.8 3.5 - 5.1 mmol/L  Chloride 95 (L) 98 - 111 mmol/L   CO2 28 22 - 32 mmol/L   Glucose, Bld 102 (H) 70 - 99 mg/dL   BUN 12 6 - 20 mg/dL   Creatinine, Ser 0.94 0.44 - 1.00 mg/dL   Calcium 9.2 8.9 - 10.3 mg/dL   GFR  calc non Af Amer >60 >60 mL/min   GFR calc Af Amer >60 >60 mL/min   Anion gap 12 5 - 15    Comment: Performed at [facility] [address], [place], [place] [address]  Glucose, capillary     Status: None   Collection Time: 03/06/19  6:42 AM  Result Value Ref Range   Glucose-Capillary 95 70 - 99 mg/dL    Dg Chest 2 View  Result Date: 03/05/2019 CLINICAL DATA:  Facial swelling and known peritonsillar abscess EXAM: CHEST - 2 VIEW COMPARISON:  None. FINDINGS: Cardiac shadows within normal limits. Lungs are well aerated bilaterally. Fullness in the right paratracheal region is noted consistent with the lymphadenopathy seen on recent CT of the neck. No focal infiltrate or effusion is seen. No acute bony abnormality is noted. IMPRESSION: Right paratracheal lymphadenopathy similar to that seen on recent CT examination. No other focal abnormality is noted. Electronically Signed   By: Inez Catalina M.D.   On: 03/05/2019 15:58   Ct Soft Tissue Neck W Contrast  Result Date: 03/05/2019 CLINICAL DATA:  Worsening facial and neck swelling with difficulty swallowing. EXAM: CT NECK WITH CONTRAST TECHNIQUE: Multidetector CT imaging of the neck was performed using the standard protocol following the bolus administration of intravenous contrast. CONTRAST:  69mL OMNIPAQUE IOHEXOL 300 MG/ML  SOLN COMPARISON:  03/04/2019 FINDINGS: Pharynx and larynx: Symmetric pharyngeal soft tissues without evidence of mass. Diffuse retropharyngeal fluid has increased and now measures up to 2.4 cm in AP thickness at the C3 level with mild mass effect on the posterior oropharynx. No rim enhancement of this fluid. No peritonsillar abscess. Patent airway. Unremarkable larynx. Salivary glands: No inflammation, mass, or stone. Thyroid: Unremarkable. Lymph nodes: Unchanged bilateral cervical lymphadenopathy including level IIa lymph nodes measuring up to 16 mm in short axis on the right and 14 mm on the left as well as a 6.1 x 4.8 cm nodal  conglomerate on the right level IV. Similar appearance of partially visualized mediastinal lymphadenopathy including a 6.4 x 4.1 cm right paratracheal mass. Vascular: Major vascular structures in the neck are grossly patent although the right internal jugular vein appears compressed in the lower neck by the nodal mass and is poorly visualized near the confluence with the subclavian vein, partly due to patient body habitus. Limited intracranial: Unremarkable. Visualized orbits: Not imaged. Mastoids and visualized paranasal sinuses: Small left maxillary sinus mucous retention cyst. Clear mastoid air cells. Skeleton: Mild lower cervical spondylosis at C6-7. Advanced facet arthrosis in the included thoracic spine. No suspicious osseous lesion. Upper chest: Clear lung apices. Other: Multiple dental caries and periapical lucencies including a prominent periapical lucency involving the right lateral maxillary incisor with disruption of the buccal cortex of the alveolar ridge with an unchanged overlying 8 x 6 mm fluid collection and similar appearance of overlying soft tissue inflammation. Fat stranding more inferiorly in the lower face, right greater than left with asymmetric thickening of the platysma on the right, is stable to slightly increased. IMPRESSION: 1. Odontogenic infection with unchanged 8 x 6 mm subperiosteal abscess associated with the right maxillary incisor. 2. Increased retropharyngeal fluid with mild mass effect. While there is  no rim enhancement and a sterile effusion is possible, retropharyngeal abscess is a concern given its size and progression since yesterday. 3. Mildly increased inflammatory stranding/swelling in the lower face and neck without new fluid collection. 4. Unchanged cervical and mediastinal lymphadenopathy including large conglomerate nodal masses in the right lower neck and superior mediastinum concerning for metastatic disease or lymphoproliferative disease. Electronically Signed    By: Logan Bores M.D.   On: 03/05/2019 16:15   Ct Soft Tissue Neck W Contrast  Result Date: 03/04/2019 CLINICAL DATA:  Initial evaluation for acute anterior neck swelling, neck mass. Sore throat. EXAM: CT NECK WITH CONTRAST TECHNIQUE: Multidetector CT imaging of the neck was performed using the standard protocol following the bolus administration of intravenous contrast. CONTRAST:  69mL OMNIPAQUE IOHEXOL 300 MG/ML  SOLN COMPARISON:  None available. FINDINGS: Pharynx and larynx: Oral cavity within normal limits without discrete mass or loculated fluid collection. Prominent periapical lucency with dehiscence of the overlying alveolar ridge present at the right maxillary lateral incisor. Overlying soft tissue swelling with inflammatory stranding concerning for odontogenic infection. Ill-defined hypodense collection within this region measuring 8 x 12 x 11 mm suspicious for early/developing odontogenic abscess (series 2, image 7). Additional hazy inflammatory stranding seen involving the subcutaneous fat inferiorly within the anterior neck, just inferior to the mandible (series 2, image 53). Associated thickening of the platysmas, right greater than left. Findings suspicious for additional site of infection/cellulitis. No base of tongue lesion. [place]atine tonsils symmetric and within normal limits. Nasopharynx within normal limits. Moderate retropharyngeal effusion seen diffusely throughout the retropharyngeal soft tissues. No loculated collection or retropharyngeal abscess. Lateral extension into the carotid spaces bilaterally. Mild mass effect on the hypopharynx anteriorly. Supraglottic airway remains patent at this time. No significant associated mucosal edema. Epiglottis normal. Vallecula clear. Remainder of the hypopharynx and supraglottic larynx within normal limits. True cords symmetric and grossly normal. Subglottic airway clear. Salivary glands: Salivary glands including the parotid and submandibular glands  are normal. Thyroid: Thyroid within normal limits. Lymph nodes: Increased number of lymph nodes seen within the neck bilaterally. Superimposed enlarged bilateral level II lymph nodes measure up to 16 mm on the right and 13 mm on the left. Level III nodes measure up to 11 mm bilaterally. There is an enlarged soft tissue mass at right level IV measuring 6.2 x 4.7 x 4.6 cm, likely a nodal conglomerate (series 2, image 84). An enlarged nodal conglomerate within the right paratracheal region measures approximately 6.0 x 3.6 x 6.5 cm (series 2, image 117). Few additional scattered enlarged mediastinal lymph nodes noted. Few mildly prominent posterior chain nodes measure up to 9 mm on the right. Vascular: Normal intravascular enhancement seen throughout the neck. Limited intracranial: Unremarkable. Visualized orbits: Not included on this exam. Mastoids and visualized paranasal sinuses: Visualized paranasal sinuses are clear. Mastoid air cells and middle ear cavities are largely well pneumatized and free of fluid. Skeleton: No acute osseous finding. No discrete lytic or blastic osseous lesions. Mild cervical spondylolysis noted at C6-7. Upper chest: Remainder of the partially visualized upper chest demonstrates no other acute finding. Partially visualized lungs are clear. Other: None. IMPRESSION: 1. Hazy inflammatory stranding with swelling involving the subcutaneous fat of the lower anterior neck, just inferior to the mandible, suspicious for possible infection/cellulitis. Moderate diffuse retropharyngeal effusion suspected to be reactive. No discrete retropharyngeal abscess. 2. Prominent periapical lucency about the right maxillary lateral incisor with adjacent inflammatory stranding, concerning for acute odontogenic infection. Superimposed 8 x 12 x 11  mm hypodense collection within this region compatible with early odontogenic abscess. 3. Enlarged nodal conglomerates within the right lower neck and right mediastinum  measuring up to 6 cm in diameter. Given size, these are felt to be unlikely be reactive in nature, and are most concerning for possible concomitant lymphoproliferative disorder or nodal metastases. No primary mass lesion seen within the neck. Correlation with histology may be helpful for further evaluation. Additional prominent bilateral cervical lymph nodes as above are more indeterminate, and could be reactive. Electronically Signed   By: Jeannine Boga M.D.   On: 03/04/2019 19:57   Review of Systems  Constitutional: Negative for fever.  HENT: Positive for facial swelling. Negative for sore throat.   Respiratory: Negative for cough and shortness of breath.   Gastrointestinal: Negative for vomiting.  Musculoskeletal: Positive for neck pain.  All other systems reviewed and are negative.  Blood pressure 133/71, pulse 92, temperature 97.9 F (36.6 C), temperature source Oral, resp. rate 14, height 5\' 6"  (1.676 m), weight 131.5 kg, last menstrual period 02/21/2019, SpO2 100 %. Physical Exam: General: Awake and alert and oriented x3. No acute distress.  Eyes: Pupils are equal, round, reactive to light. Extraocular motion is intact.  Ears: Examination of the ears shows normal auricles and external auditory canals bilaterally. Both tympanic membranes are intact.  Face: Increased erythema to right lower face, which extends to anterior and lateral neck.  Nose: Nasal examination shows normal mucosa, septum, turbinates.   Mouth: Oral cavity examination shows purulent drainage from the root of the right maxillary incisor.  Neck: Palpation of the neck reveals bilateral lymphadenopathy. The trachea is midline. The thyroid is not significantly enlarged.  Neuro: Cranial nerves 2-12 are all grossly in tact. Chest: Good respiratory effort with no wheezes, rales, rhonchi. Lungs clear to auscultation bilaterally.  No accessory muscle use. No stridor. Psychiatric:  Intact judgment and insight. Pleasant and  cooperative. Neurologic: Cranial nerves II through XII are grossly intact.   Assessment/Plan: Odontogenic abscess with overlying facial cellulitis, which is the primary source of her infection. Purulent drainage is noted from her right maxillary incisor. - Pt also has retropharyngeal effusion without rim enhancement. She has no significant neck pain or breathing difficulty. Her airway is widely patent. - Continue with IV abx. - Recommend oral surgery/dental consult. - No acute ENT intervention needed at this time.  Yianni Skilling W Sherryll Skoczylas 03/06/2019, 1:01 PM

## 2019-03-07 ENCOUNTER — Inpatient Hospital Stay (HOSPITAL_COMMUNITY): Payer: BLUE CROSS/BLUE SHIELD

## 2019-03-07 ENCOUNTER — Encounter (HOSPITAL_COMMUNITY): Payer: Self-pay | Admitting: Dentistry

## 2019-03-07 DIAGNOSIS — K047 Periapical abscess without sinus: Secondary | ICD-10-CM | POA: Diagnosis present

## 2019-03-07 LAB — GLUCOSE, CAPILLARY
Glucose-Capillary: 100 mg/dL — ABNORMAL HIGH (ref 70–99)
Glucose-Capillary: 87 mg/dL (ref 70–99)
Glucose-Capillary: 97 mg/dL (ref 70–99)
Glucose-Capillary: 84 mg/dL (ref 70–99)
Glucose-Capillary: 116 mg/dL — ABNORMAL HIGH (ref 70–99)

## 2019-03-07 LAB — BASIC METABOLIC PANEL
Anion gap: 14 (ref 5–15)
BUN: 9 mg/dL (ref 6–20)
CO2: 23 mmol/L (ref 22–32)
Calcium: 9 mg/dL (ref 8.9–10.3)
Chloride: 100 mmol/L (ref 98–111)
Creatinine, Ser: 0.85 mg/dL (ref 0.44–1.00)
GFR calc Af Amer: >60 mL/min (ref >60)
GFR calc non Af Amer: >60 mL/min (ref >60)
Glucose, Bld: 87 mg/dL (ref 70–99)
Potassium: 3.9 mmol/L (ref 3.5–5.1)
Sodium: 137 mmol/L (ref 135–145)

## 2019-03-07 LAB — CBC
HCT: 40.4 % (ref 36.0–46.0)
Hemoglobin: 13.1 g/dL (ref 12.0–15.0)
MCH: 28.2 pg (ref 26.0–34.0)
MCHC: 32.4 g/dL (ref 30.0–36.0)
MCV: 86.9 fL (ref 80.0–100.0)
Platelets: 296 10*3/uL (ref 150–400)
RBC: 4.65 MIL/uL (ref 3.87–5.11)
RDW: 14.7 % (ref 11.5–15.5)
WBC: 11.5 10*3/uL — ABNORMAL HIGH (ref 4.0–10.5)
nRBC: 0 % (ref 0.0–0.2)

## 2019-03-07 LAB — HEMOGLOBIN A1C
Hgb A1c MFr Bld: 6.2 % — ABNORMAL HIGH (ref 4.8–5.6)
Mean Plasma Glucose: 131.24 mg/dL

## 2019-03-07 MED ORDER — IBUPROFEN 600 MG PO TABS: 600.0000 mg | ORAL_TABLET | Freq: Four times a day (QID) | ORAL | Status: DC | PRN

## 2019-03-07 MED ORDER — KETOROLAC TROMETHAMINE 15 MG/ML IJ SOLN
15.0000 mg | Freq: Three times a day (TID) | INTRAMUSCULAR | Status: AC
  Administered 2019-03-07 (×2): 15 mg via INTRAVENOUS
  Filled 2019-03-07 (×2): qty 1

## 2019-03-07 NOTE — Progress Notes (Signed)
DENTAL CONSULTATION  Date of Consultation:  03/07/2019 Patient Name:   Tara Aguilar Date of Birth:   01-28-1981 Medical Record Number: [id number]  VITALS: BP 121/64 (BP Location: Right Arm)   Pulse 99   Temp 98.1 F (36.7 C) (Oral)   Resp 16   Ht 5\' 6"  (1.676 m)   Wt 131.5 kg   LMP 02/21/2019 (Approximate)   SpO2 96%   BMI 46.81 kg/m   CHIEF COMPLAINT: Dental consultation requested by Dr. Tyrell Antonio.  HPI: Tara Aguilar is a 38 -year-old female with history of right facial swelling that started approximately 02-26-2019. Patient was prescribed clindamycin 300 mg po 4 times a day for 10 days starting on 02/26/2019 in the emergency department. The swelling increased and she also developed bilateral neck swelling. Patient then presented to the emergency department at [facility] on 03/05/2019. Patient was placed on IV antibiotic therapy with clindamycin 600 mg IV every 8 hours. An ENT consultation was provided Dr. Benjamine Mola.  Dr. Benjamine Mola felt that the patient had a patent airway at this time and suggested evaluation of dental abscess by oral surgeon or Hospital dentist.  Dr. Benjamine Mola had indicated that the patient should follow-up with him for further evaluation of the neck swelling and mediastinal adenopathy noted on the CT scans.  Patient is now seen to further evaluate the patient for dental etiology for the right facial swelling and to provide treatment as indicated.  The patient currently denies any acute toothache symptoms associated with the maxillary right teeth. The patient is able to point to the buccal abscess in the area of tooth numbers 6 through 8. Patient indicates that the facial swelling has significantly resolved since she has been on the IV antibiotic therapy. The patient, however, indicates that she still has persistent neck swelling by report.  The patient last saw the dentist about 5 years ago for a "filling". This was with Dr. Abel Presto in [place], [place]. Patient only sees the dentist when she  needs to by report. Patient denies having any partial dentures. Patient denies having dental phobia.  PROBLEM LIST: Patient Active Problem List   Diagnosis Date Noted  . Dental abscess 03/07/2019  . 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 Depression     PMH: Past Medical History:  Diagnosis Date  . Depression     PSH: Past Surgical History:  Procedure Laterality Date  . CESAREAN SECTION      ALLERGIES: No Known Allergies  MEDICATIONS: Current Facility-Administered Medications  Medication Dose Route Frequency Provider Last Rate Last Dose  . 0.9 %  sodium chloride infusion   Intravenous Continuous Truett Mainland, DO 100 mL/hr at 03/07/19 1004    . acetaminophen (TYLENOL) tablet 650 mg  650 mg Oral Q6H PRN Truett Mainland, DO   650 mg at 03/07/19 0254   Or  . acetaminophen (TYLENOL) suppository 650 mg  650 mg Rectal Q6H PRN Truett Mainland, DO      . buPROPion (WELLBUTRIN XL) 24 hr tablet 150 mg  150 mg Oral Daily Truett Mainland, DO   150 mg at 03/07/19 1006  . clindamycin (CLEOCIN) IVPB 600 mg  600 mg Intravenous Q8H Truett Mainland, DO 100 mL/hr at 03/07/19 1006 600 mg at 03/07/19 1006  . ibuprofen (ADVIL) tablet 600 mg  600 mg Oral Q6H PRN Truett Mainland, DO   600 mg at 03/06/19 1139  . ondansetron (ZOFRAN) tablet 4 mg  4 mg Oral Q6H PRN  Truett Mainland, DO       Or  . ondansetron [facility]) injection 4 mg  4 mg Intravenous Q6H PRN Truett Mainland, DO        LABS: Lab Results  Component Value Date   WBC 11.5 (H) 03-22-19   HGB 13.1 03-22-19   HCT 40.4 2019-03-22   MCV 86.9 22-Mar-2019   PLT 296 03-22-2019      Component Value Date/Time   NA 137 Mar 22, 2019 0239   K 3.9 03-22-2019 0239   CL 100 2019-03-22 0239   CO2 23 03-22-19 0239   GLUCOSE 87 Mar 22, 2019 0239   BUN 9 03-22-19 0239   CREATININE 0.85 03/22/19 0239   CALCIUM 9.0 Mar 22, 2019 0239   GFRNONAA >60 03/22/2019 0239   GFRAA >60 Mar 22, 2019 0239   No  results found for: INR, PROTIME No results found for: PTT  SOCIAL HISTORY: Social History   Socioeconomic History  . Marital status: Widowed    Spouse name: Not on file  . Number of children: 1  . Years of education: Not on file  . Highest education level: Not on file  Occupational History  . Not on file  Social Needs  . Financial resource strain: Not on file  . Food insecurity:    Worry: Not on file    Inability: Not on file  . Transportation needs:    Medical: Not on file    Non-medical: Not on file  Tobacco Use  . Smoking status: Current Every Day Smoker    Packs/day: 1.00    Years: 13.00    Pack years: 13.00    Types: Cigarettes  . Smokeless tobacco: Never Used  Substance and Sexual Activity  . Alcohol use: Not on file    Comment: social   . Drug use: Not Currently  . Sexual activity: Not on file  Lifestyle  . Physical activity:    Days per week: Not on file    Minutes per session: Not on file  . Stress: Not on file  Relationships  . Social connections:    Talks on phone: Not on file    Gets together: Not on file    Attends religious service: Not on file    Active member of club or organization: Not on file    Attends meetings of clubs or organizations: Not on file    Relationship status: Not on file  . Intimate partner violence:    Fear of current or ex partner: Not on file    Emotionally abused: Not on file    Physically abused: Not on file    Forced sexual activity: Not on file  Other Topics Concern  . Not on file  Social History Narrative   Mar 22, 2019   Husband passed away four months ago from Cancer of the [place].   Pt. Has 42 yo son at home.    FAMILY HISTORY: No family history on file.  REVIEW OF SYSTEMS: Reviewed with the patient as per History of present illness. Psych: Patient denies having dental phobia.  DENTAL HISTORY: CHIEF COMPLAINT: Dental consultation requested by Dr. Tyrell Antonio.  HPI: Tara Aguilar is a 38 -year-old female with  history of right facial swelling that started approximately 02-26-2019. Patient was prescribed clindamycin 300 mg po 4 times a day for 10 days starting on 02/26/2019 in the emergency department. The swelling increased and she also developed bilateral neck swelling. Patient then presented to the emergency department at [facility] on 03/05/2019. Patient was placed on  IV antibiotic therapy with clindamycin 600 mg IV every 8 hours. An ENT consultation was provided Dr. Benjamine Mola.  Dr. Benjamine Mola felt that the patient had a patent airway at this time and suggested evaluation of dental abscess by oral surgeon or Hospital dentist.  Dr. Benjamine Mola had indicated that the patient should follow-up with him for further evaluation of the neck swelling and mediastinal adenopathy noted on the CT scans.  Patient is now seen to further evaluate the patient for dental etiology for the right facial swelling and to provide treatment as indicated.  The patient currently denies any acute toothache symptoms associated with the maxillary right teeth. The patient is able to point to the buccal abscess in the area of tooth numbers 6 through 8. Patient indicates that the facial swelling has significantly resolved since she has been on the IV antibiotic therapy. The patient, however, indicates that she still has persistent neck swelling by report.  The patient last saw the dentist about 5 years ago for a "filling". This was with Dr. Abel Presto in [place], [place]. Patient only sees the dentist when she needs to by report. Patient denies having any partial dentures. Patient denies having dental phobia.  DENTAL EXAMINATION: GENERAL:  The patient is a well-developed, obese female in no acute distress. HEAD AND NECK:  Patient has significant right and left neck swelling/lymphadenopathy. Patient has some right facial swelling in the infraorbital area. The facial swelling has significantly resolved with the IV antibiotic therapy by patient report. INTRAORAL  EXAM:  Patient has normal saliva. There is evidence of buccal vestibule abscess in the area of tooth numbers 6 through 8. There is evidence of a fistulous tract that is draining. DENTITION: The patient is missing tooth numbers 1, 3, 15, 16, 17 -21, 31, and 32. There is a retained root segment in the area of #2 and #30. PERIODONTAL:  The patient has chronic periodontitis with plaque and calculus, accretions, gingival recession, and some tooth mobility. There is incipient to moderate bone loss noted. DENTAL CARIES/SUBOPTIMAL RESTORATIONS: Multiple dental caries are noted. ENDODONTIC:  The patient currently denies acute pulpitis symptoms.  The patient does have periapical pathology and radiolucency associated with tooth #2 and retained root tip #30. The periapical area of tooth numbers 6 through 8 is not visualized well on radiographs, but it appears that tooth #7 has periapical pathology. CROWN AND BRIDGE:  There are no crown or bridge restorations. PROSTHODONTIC: The patient denies having partial dentures. OCCLUSION:  The patient has a poor occlusal scheme and malocclusion secondary to multiple missing teeth, retained root segment, and lack of replacement of missing teeth with dental prostheses.  RADIOGRAPHIC INTERPRETATION: An orthopantogram was taken today. This is suboptimal. There are multiple missing teeth. There is retained root segment in the area of tooth # 2 and 30. There is a periapical radiolucency at the apex of tooth #2 and possibly #6 through 8 although this is difficult to diagnose secondary to poor quality of radiographs. Multiple dental resin restorations are noted. There is incipient to moderate bone loss noted.   ASSESSMENTS: 1. Right facial swelling 2. Buccal vestibule abscess in the area of tooth #6 through 8 3. Chronic apical periodontitis 4. Retained root segments 5. Dental caries 6. Chronic periodontitis with bone loss 7. Gingival recession 8. Accretions 9. Tooth  mobility 10. Multiple missing teeth 11. Supra-eruption and drifting of the unopposed teeth into the edentulous areas excellent 12. Poor occlusal scheme and malocclusion#13. No history of partial dentures 13. Retropharyngeal abscess  with need for further workup by ENT-Dr. Benjamine Mola. 14. Covid 19 -negative status as of 03/05/2019.  PLAN/RECOMMENDATIONS: 1. I discussed the risks, benefits, and complications of various treatment options with the patient in relationship to her medical and dental conditions. We discussed various treatment options to include no treatment, multiple extractions with alveoloplasty, pre-prosthetic surgery as indicated, periodontal therapy, dental restorations, root canal therapy, crown and bridge therapy, implant therapy, and replacement of missing teeth as indicated. We also discussed referral to an oral surgeon for evaluation and treatment as indicated.  Due to the suboptimal nature of the CT scan and orthopantogram, the patient will need additional dental radiographs prior to anticipated dental extraction procedures of tooth numbers 2, 30, and possibly tooth numbers 6 through 8 as needed. The patient has been evaluated today by Dr. Tyrell Antonio and should be able to be discharged tomorrow. Dr. Raynelle Dick office has been contacted the patient is currently scheduled for an oral surgery consultation on 03/08/2019 at 11:30 am.  The patient will be seen for an additional orthopantogram and possible extraction of indicated teeth as indicated.  The patient also understands that she will need follow with Dr. Benjamine Mola for further evaluation of her retropharyngeal abscess.   2. Discussion of findings with medical team and coordination of future medical and dental care as needed.    Lenn Cal, DDS

## 2019-03-07 NOTE — Progress Notes (Signed)
PROGRESS NOTE    Tara Aguilar  NTI:[id number] DOB: 1981-10-12 DOA: 03/05/2019 PCP: [facility]    Brief Narrative; 38 year old with past medical history significant for depression who was recently seen for right side face and neck swelling that is started about a week prior to admission.  She was seen at [facility] on the ED and was treated with clindamycin and IV steroids.  She was referred her to a dentist for an abscessed tooth.  Patient reported that she started to have purulent drainage in her mouth and had some anterior neck swelling.  She was referred by telemedicine to the ED for further evaluation.  A CT done showed odontogenic abscess with overlying cellulitis.  He was then prescribed Augmentin and discharged home.  She returned again complaining of worsening the swelling of her neck.  Repeat a CT scan showed unchanged odontogenic abscess but increased retropharyngeal fluid with mass-effect concerning for developing retropharyngeal abscess.  Patient was admitted for IV antibiotics and further evaluation by ENT and dentist oral surgeon.  Assessment & Plan:   Principal Problem:   Dental abscess Active Problems:   Depression   Retropharyngeal abscess   Borderline diabetes  1-Odontogenic abscess: Continue with IV clindamycin. Patient still with significant neck swelling, will benefit from another day of IV antibiotics. Arrangement has been made for her to see Dr [place] in his office tomorrow at 11;30 for further evaluation of dental abscess.  Appreciate Dr Lawana Chambers assistance. He made arrangement for patient to follow with oral surgeon in the office tomorrow. orthopantogram was of limited evaluation.  Blood culture no growth today.  2-retropharyngeal abscess; patient was evaluated by ENT Dr. Palma Holter. He is recommending IV antibiotics and oral surgeon dentist evaluation and treatment of odontogenic abscess.  Per Dr. Leonarda Salon retropharyngeal area is more consistent with effusion.  Continue with IV clindamycin Needs treatment of odontogenic abscess.   3-neck lymphadenopathy: After patient recovered from this infection she will need a follow-up for this lymphadenopathy. Needs to follow up with Dr Benjamine Mola, patient was informed this.   4-depression: Continue with home medication. Borderline diabetes hemoglobin A1c   Estimated body mass index is 46.81 kg/m as calculated from the following:   Height as of this encounter: 5\' 6"  (1.676 m).   Weight as of this encounter: 131.5 kg.   DVT prophylaxis: SCD Code Status: full code Family Communication: care discussed with patinet Disposition Plan: Patient will benefit from another day of IV clindamycin and IV Toradol to help decrease in size of neck swelling.     Consultants:   Dr Benjamine Mola  Dr Lauraine Rinne  Dr Nemiah Commander   Procedures:   none  Antimicrobials:  Clindamycin.   Subjective: Neck swelling she noticed mild improvement.  She will try to eat today.  Soft diet. Neck is swelling and significant still Objective: Vitals:   03/06/19 1547 03/06/19 2034 03/07/19 0428 03/07/19 1411  BP: 125/67 127/83 121/64 96/67  Pulse: 91 100 99 90  Resp:  18 16 16   Temp: 98.5 F (36.9 C) 98.4 F (36.9 C) 98.1 F (36.7 C) (!) 97.5 F (36.4 C)  TempSrc: Oral Oral Oral Oral  SpO2: 96% 99% 96% 98%  Weight:      Height:        Intake/Output Summary (Last 24 hours) at 03/07/2019 1525 Last data filed at 03/07/2019 0924 Gross per 24 hour  Intake 120 ml  Output -  Net 120 ml   Filed Weights   03/05/19 1326  Weight: 131.5 kg  Examination:  General exam: Bilateral massive neck swelling, decreased slightly Respiratory system: Clear to auscultation Cardiovascular system: S1, S2 regular rhythm and rate Gastrointestinal system: Bowel sounds present soft nontender nondistended Central nervous system: Nonfocal Extremities: Symmetric power Skin: No rash  Data Reviewed: I have personally reviewed following labs and imaging  studies  CBC: Recent Labs  Lab 03/04/19 1545 03/05/19 1410 03/06/19 0217 03/07/19 0239  WBC 16.6* 12.8* 15.4* 11.5*  NEUTROABS 12.3* 9.6*  --   --   HGB 13.0 13.1 13.0 13.1  HCT 41.0 40.9 40.7 40.4  MCV 87.4 87.4 86.6 86.9  PLT 349 322 345 009   Basic Metabolic Panel: Recent Labs  Lab 03/04/19 1545 03/05/19 1410 03/06/19 0217 03/07/19 0239  NA 137 137 135 137  K 4.5 4.0 3.8 3.9  CL 103 103 95* 100  CO2 24 24 28 23   GLUCOSE 113* 125* 102* 87  BUN 24* 19 12 9   CREATININE 0.80 0.81 0.94 0.85  CALCIUM 9.4 9.2 9.2 9.0   GFR: Estimated Creatinine Clearance: 123.7 mL/min (by C-G formula based on SCr of 0.85 mg/dL). Liver Function Tests: No results for input(s): AST, ALT, ALKPHOS, BILITOT, PROT, ALBUMIN in the last 168 hours. No results for input(s): LIPASE, AMYLASE in the last 168 hours. No results for input(s): AMMONIA in the last 168 hours. Coagulation Profile: No results for input(s): INR, PROTIME in the last 168 hours. Cardiac Enzymes: No results for input(s): CKTOTAL, CKMB, CKMBINDEX, TROPONINI in the last 168 hours. BNP (last 3 results) No results for input(s): PROBNP in the last 8760 hours. HbA1C: Recent Labs    03/07/19 0239  HGBA1C 6.2*   CBG: Recent Labs  Lab 03/06/19 0049 03/06/19 0642 03/07/19 0559 03/07/19 0732 03/07/19 1224  GLUCAP 87 95 100* 87 97   Lipid Profile: No results for input(s): CHOL, HDL, LDLCALC, TRIG, CHOLHDL, LDLDIRECT in the last 72 hours. Thyroid Function Tests: No results for input(s): TSH, T4TOTAL, FREET4, T3FREE, THYROIDAB in the last 72 hours. Anemia Panel: No results for input(s): VITAMINB12, FOLATE, FERRITIN, TIBC, IRON, RETICCTPCT in the last 72 hours. Sepsis Labs: Recent Labs  Lab 03/05/19 1440 03/05/19 1606  LATICACIDVEN 1.1 1.1    Recent Results (from the past 240 hour(s))  SARS Coronavirus 2 (CEPHEID - Performed in [facility] hospital lab), Hosp Order     Status: None   Collection Time: 03/05/19  5:10 PM   Result Value Ref Range Status   SARS Coronavirus 2 NEGATIVE NEGATIVE Final    Comment: (NOTE) If result is NEGATIVE SARS-CoV-2 target nucleic acids are NOT DETECTED. The SARS-CoV-2 RNA is generally detectable in upper and lower  respiratory specimens during the acute phase of infection. The lowest  concentration of SARS-CoV-2 viral copies this assay can detect is 250  copies / mL. A negative result does not preclude SARS-CoV-2 infection  and should not be used as the sole basis for treatment or other  patient management decisions.  A negative result may occur with  improper specimen collection / handling, submission of specimen other  than nasopharyngeal swab, presence of viral mutation(s) within the  areas targeted by this assay, and inadequate number of viral copies  (<250 copies / mL). A negative result must be combined with clinical  observations, patient history, and epidemiological information. If result is POSITIVE SARS-CoV-2 target nucleic acids are DETECTED. The SARS-CoV-2 RNA is generally detectable in upper and lower  respiratory specimens dur ing the acute phase of infection.  Positive  results are indicative  of active infection with SARS-CoV-2.  Clinical  correlation with patient history and other diagnostic information is  necessary to determine patient infection status.  Positive results do  not rule out bacterial infection or co-infection with other viruses. If result is PRESUMPTIVE POSTIVE SARS-CoV-2 nucleic acids MAY BE PRESENT.   A presumptive positive result was obtained on the submitted specimen  and confirmed on repeat testing.  While 2019 novel coronavirus  (SARS-CoV-2) nucleic acids may be present in the submitted sample  additional confirmatory testing may be necessary for epidemiological  and / or clinical management purposes  to differentiate between  SARS-CoV-2 and other Sarbecovirus currently known to infect humans.  If clinically indicated additional  testing with an alternate test  methodology [phone]) is advised. The SARS-CoV-2 RNA is generally  detectable in upper and lower respiratory sp ecimens during the acute  phase of infection. The expected result is Negative. Fact Sheet for Patients:  StrictlyIdeas.no Fact Sheet for Healthcare Providers: BankingDealers.co.za This test is not yet approved or cleared by the [place] FDA and has been authorized for detection and/or diagnosis of SARS-CoV-2 by FDA under an Emergency Use Authorization (EUA).  This EUA will remain in effect (meaning this test can be used) for the duration of the COVID-19 declaration under Section 564(b)(1) of the Act, 21 U.S.C. section 360bbb-3(b)(1), unless the authorization is terminated or revoked sooner. Performed at [facility], [address], [place], [place] [address]   MRSA PCR Screening     Status: None   Collection Time: 03/05/19  5:15 PM  Result Value Ref Range Status   MRSA by PCR NEGATIVE NEGATIVE Final    Comment:        The GeneXpert MRSA Assay (FDA approved for NASAL specimens only), is one component of a comprehensive MRSA colonization surveillance program. It is not intended to diagnose MRSA infection nor to guide or monitor treatment for MRSA infections. Performed at [facility], [address], [place], [place] [address]   Culture, blood (routine x 2)     Status: None (Preliminary result)   Collection Time: 03/05/19  6:38 PM  Result Value Ref Range Status   Specimen Description RIGHT ANTECUBITAL  Final   Special Requests   Final    BOTTLES DRAWN AEROBIC AND ANAEROBIC Blood Culture adequate volume   Culture   Final    NO GROWTH 2 DAYS Performed at [facility], [address], [place], [place] [address]    Report Status PENDING  Incomplete  Culture, blood (routine x 2)     Status: None (Preliminary result)   Collection Time: 03/05/19  6:42 PM  Result Value Ref Range Status    Specimen Description LEFT ANTECUBITAL  Final   Special Requests   Final    BOTTLES DRAWN AEROBIC AND ANAEROBIC Blood Culture adequate volume   Culture   Final    NO GROWTH 2 DAYS Performed at [facility], [address] [place], [place] [address]    Report Status PENDING  Incomplete         Radiology Studies: Dg Orthopantogram  Result Date: 03/07/2019 CLINICAL DATA:  Right-sided facial swelling. EXAM: ORTHOPANTOGRAM/PANORAMIC COMPARISON:  None. FINDINGS: Advanced decay of the remaining right maxillary molar, including periapical lucency. Other residual dentition does not show visible decay or periodontal disease. IMPRESSION: Advanced decay and periapical lucency affecting the remaining right maxillary molar. Electronically Signed   By: Nelson Chimes M.D.   On: 03/07/2019 11:00   Dg Chest 2 View  Result  Date: 03/05/2019 CLINICAL DATA:  Facial swelling and known peritonsillar abscess EXAM: CHEST - 2 VIEW COMPARISON:  None. FINDINGS: Cardiac shadows within normal limits. Lungs are well aerated bilaterally. Fullness in the right paratracheal region is noted consistent with the lymphadenopathy seen on recent CT of the neck. No focal infiltrate or effusion is seen. No acute bony abnormality is noted. IMPRESSION: Right paratracheal lymphadenopathy similar to that seen on recent CT examination. No other focal abnormality is noted. Electronically Signed   By: Inez Catalina M.D.   On: 03/05/2019 15:58   Ct Soft Tissue Neck W Contrast  Result Date: 03/05/2019 CLINICAL DATA:  Worsening facial and neck swelling with difficulty swallowing. EXAM: CT NECK WITH CONTRAST TECHNIQUE: Multidetector CT imaging of the neck was performed using the standard protocol following the bolus administration of intravenous contrast. CONTRAST:  37mL OMNIPAQUE IOHEXOL 300 MG/ML  SOLN COMPARISON:  03/04/2019 FINDINGS: Pharynx and larynx: Symmetric pharyngeal soft tissues without evidence of mass. Diffuse retropharyngeal fluid has  increased and now measures up to 2.4 cm in AP thickness at the C3 level with mild mass effect on the posterior oropharynx. No rim enhancement of this fluid. No peritonsillar abscess. Patent airway. Unremarkable larynx. Salivary glands: No inflammation, mass, or stone. Thyroid: Unremarkable. Lymph nodes: Unchanged bilateral cervical lymphadenopathy including level IIa lymph nodes measuring up to 16 mm in short axis on the right and 14 mm on the left as well as a 6.1 x 4.8 cm nodal conglomerate on the right level IV. Similar appearance of partially visualized mediastinal lymphadenopathy including a 6.4 x 4.1 cm right paratracheal mass. Vascular: Major vascular structures in the neck are grossly patent although the right internal jugular vein appears compressed in the lower neck by the nodal mass and is poorly visualized near the confluence with the subclavian vein, partly due to patient body habitus. Limited intracranial: Unremarkable. Visualized orbits: Not imaged. Mastoids and visualized paranasal sinuses: Small left maxillary sinus mucous retention cyst. Clear mastoid air cells. Skeleton: Mild lower cervical spondylosis at C6-7. Advanced facet arthrosis in the included thoracic spine. No suspicious osseous lesion. Upper chest: Clear lung apices. Other: Multiple dental caries and periapical lucencies including a prominent periapical lucency involving the right lateral maxillary incisor with disruption of the buccal cortex of the alveolar ridge with an unchanged overlying 8 x 6 mm fluid collection and similar appearance of overlying soft tissue inflammation. Fat stranding more inferiorly in the lower face, right greater than left with asymmetric thickening of the platysma on the right, is stable to slightly increased. IMPRESSION: 1. Odontogenic infection with unchanged 8 x 6 mm subperiosteal abscess associated with the right maxillary incisor. 2. Increased retropharyngeal fluid with mild mass effect. While there is  no rim enhancement and a sterile effusion is possible, retropharyngeal abscess is a concern given its size and progression since yesterday. 3. Mildly increased inflammatory stranding/swelling in the lower face and neck without new fluid collection. 4. Unchanged cervical and mediastinal lymphadenopathy including large conglomerate nodal masses in the right lower neck and superior mediastinum concerning for metastatic disease or lymphoproliferative disease. Electronically Signed   By: Logan Bores M.D.   On: 03/05/2019 16:15        Scheduled Meds: . buPROPion  150 mg Oral Daily  . ketorolac  15 mg Intravenous Q8H   Continuous Infusions: . sodium chloride 100 mL/hr at 03/07/19 1004  . clindamycin (CLEOCIN) IV 600 mg (03/07/19 1006)     LOS: 2 days    Time spent:  35 minutes.     Elmarie Shiley, MD [facility] Pager [phone]  If 7PM-7AM, please contact night-coverage [email or web address] Password [facility] 03/07/2019, 3:25 PM

## 2019-03-08 DIAGNOSIS — J390 Retropharyngeal and parapharyngeal abscess: Secondary | ICD-10-CM

## 2019-03-08 MED ORDER — CLINDAMYCIN HCL 300 MG PO CAPS: 300.0000 mg | ORAL_CAPSULE | Freq: Four times a day (QID) | ORAL | 0 refills | Status: DC

## 2019-03-08 NOTE — Plan of Care (Signed)
  Problem: Safety: Goal: Ability to remain free from injury will improve Outcome: Progressing   Problem: Skin Integrity: Goal: Risk for impaired skin integrity will decrease Outcome: Progressing   

## 2019-03-08 NOTE — Progress Notes (Signed)
Subjective: Pt reports improvement in her facial and dental pain and neck discomfort.  Objective: Vital signs in last 24 hours: Temp:  [97.5 F (36.4 C)-97.9 F (36.6 C)] 97.9 F (36.6 C) (05/12 0355) Pulse Rate:  [88-93] 88 (05/12 0355) Resp:  [14-18] 14 (05/12 0355) BP: (96-121)/(51-77) 117/77 (05/12 0355) SpO2:  [96 %-98 %] 96 % (05/12 0355)  Physical Exam: General: Awake and alert and oriented x3. No acute distress.  Eyes: Pupils are equal, round, reactive to light. Extraocular motion is intact.  Ears: Examination of the ears shows normal auricles and external auditory canals bilaterally. Both tympanic membranes are intact.  Face: Facial edema and erythema have improved. Nose: Nasal examination shows normal mucosa, septum, turbinates.   Mouth: No significant purulent drainage is noted today. Neck: Palpation of the neck reveals bilateral lymphadenopathy. The trachea is midline. The thyroid is not significantly enlarged.  Neuro: Cranial nerves 2-12 are all grossly in tact. Chest: Good respiratory effort with no wheezes, rales, rhonchi. Lungs clear to auscultation bilaterally. No accessory muscle use. No stridor. Psychiatric: Intact judgment and insight. Pleasant and cooperative. Neurologic: Cranial nerves II through XII are grossly intact.   Recent Labs    03/06/19 0217 03/07/19 0239  WBC 15.4* 11.5*  HGB 13.0 13.1  HCT 40.7 40.4  PLT 345 296   Recent Labs    03/06/19 0217 03/07/19 0239  NA 135 137  K 3.8 3.9  CL 95* 100  CO2 28 23  GLUCOSE 102* 87  BUN 12 9  CREATININE 0.94 0.85  CALCIUM 9.2 9.0    Medications:  I have reviewed the patient's current medications. Scheduled: . buPROPion  150 mg Oral Daily   Continuous: . sodium chloride 100 mL/hr at 03/07/19 2047  . clindamycin (CLEOCIN) IV 600 mg (03/08/19 0050)    Assessment/Plan: Odontogenic abscess with overlying facial cellulitis, improving since admission. - Pt also has retropharyngeal effusion  without rim enhancement. Her symptoms have subjectively improved. - Pt to be d/c'ed home on oral clindamycin (300mg  QID for 10 days) - She has oral surgery appt later today. - She may follow up in my [place] office in 1 week. Pt is instructed to call [phone] for appt.    LOS: 3 days   Clemens Lachman W Mayra Brahm 03/08/2019, 7:06 AM

## 2019-03-08 NOTE — Progress Notes (Signed)
Tara Aguilar to be D/C'd  per MD order. Discussed with the patient and all questions fully answered.  VSS, Skin clean, dry and intact without evidence of skin break down, no evidence of skin tears noted.  IV catheter discontinued intact. Site without signs and symptoms of complications. Dressing and pressure applied.  An After Visit Summary was printed and given to the patient. Patient received prescription.  D/c education completed with patient/family including follow up instructions, medication list, d/c activities limitations if indicated, with other d/c instructions as indicated by MD - patient able to verbalize understanding, all questions fully answered.   Patient instructed to return to ED, call 911, or call MD for any changes in condition.   Patient to be escorted via [place], and D/C home via private auto.

## 2019-03-08 NOTE — Discharge Summary (Signed)
Physician Discharge Summary  Addisyn Leclaire URK:[id number] DOB: 20-Jan-1981 DOA: 03/05/2019  PCP: [facility], [facility]  Admit date: 03/05/2019 Discharge date: 03/08/2019  Admitted From: Home  Disposition:  Home   Recommendations for Outpatient Follow-up:  1. Follow up with PCP in 1-2 weeks 2. Please obtain BMP/CBC in one week 3. Follow up with Dr Benjamine Mola for neck and mediastinal adenopathy and retropharyngeal membrane, infection.  4. Patient has an appointment on the day of discharge with oral surgeon for further care of odontogenic abscess.  5. further management of pre diabetes.   Home Health: None  Discharge Condition: Stable.  CODE STATUS: Full code Diet recommendation: Heart Healthy   Brief/Interim Summary: 38 year old with past medical history significant for depression who was recently seen for right side face and neck swelling that is started about a week prior to admission.  She was seen at [facility] on the ED and was treated with clindamycin and IV steroids.  She was referred her to a dentist for an abscessed tooth.  Patient reported that she started to have purulent drainage in her mouth and had some anterior neck swelling.  She was referred by telemedicine to the ED for further evaluation.  A CT done showed odontogenic abscess with overlying cellulitis.  He was then prescribed Augmentin and discharged home.  She returned again complaining of worsening the swelling of her neck.  Repeat a CT scan showed unchanged odontogenic abscess but increased retropharyngeal fluid with mass-effect concerning for developing retropharyngeal abscess.  Patient was admitted for IV antibiotics and further evaluation by ENT and dentist oral surgeon.  1-Odontogenic abscess: Patient was admitted with facial edema and neck edema, very significant edema. She was treated with IV antibiotics, and IV toradol. Arrangement has been made for her to see Dr [place] in his office tomorrow at 11;30 for further  evaluation of dental abscess.  Patient was treated  with IV clindamycin.  Appreciate Dr Lawana Chambers assistance. He made arrangement for patient to follow with oral surgeon in the office. orthopantogram was of limited evaluation.  Blood culture no growth.   2-Retropharyngeal abscess; patient was evaluated by ENT Dr. Palma Holter. He is recommending IV antibiotics and oral surgeon dentist evaluation and treatment of odontogenic abscess.  Per Dr. Leonarda Salon retropharyngeal area is more consistent with effusion. Continue with IV clindamycin. Discharge on clindamycin 300 mg QID for 10 days.  Needs treatment of odontogenic abscess.   3-Neck lymphadenopathy: After patient recovered from this infection she will need a follow-up for this lymphadenopathy. Needs to follow up with Dr Benjamine Mola, patient was informed this.   4-Depression: Continue with home medication.  5-Borderline diabetes hemoglobin A1c  6.2  6-Morbid obesity; diet education.   Discharge Diagnoses:  Principal Problem:   Dental abscess Active Problems:   Depression   Retropharyngeal abscess   Borderline diabetes    Discharge Instructions  Discharge Instructions    Diet - low sodium heart healthy   Complete by:  As directed    Increase activity slowly   Complete by:  As directed      Allergies as of 03/08/2019   No Known Allergies     Medication List    STOP taking these medications   amoxicillin-clavulanate 875-125 MG tablet Commonly known as:  Augmentin   lidocaine 2 % solution Commonly known as:  XYLOCAINE   methylPREDNISolone 4 MG Tbpk tablet Commonly known as:  MEDROL DOSEPAK     TAKE these medications   buPROPion 150 MG 24 hr tablet Commonly known  as:  WELLBUTRIN XL Take 150 mg by mouth daily.   clindamycin 300 MG capsule Commonly known as:  CLEOCIN Take 1 capsule (300 mg total) by mouth 4 (four) times daily for 10 days.   ibuprofen 400 MG tablet Commonly known as:  ADVIL Take 800 mg by mouth every 6 (six)  hours as needed.      Follow-up Information    Frederik Schmidt, MD Follow up.   Specialty:  Oral Surgery Why:  Appointment at 11;30 AM.  Contact information: [address] [place] [address] [phone]        Leta Baptist, MD Follow up in 3 week(s).   Specialty:  Otolaryngology Why:  to follow up neck and chest lymphadenopathy.  Contact information: [place] [address] [phone]          No Known Allergies  Consultations:  ENT  Dentist  Oral surgeon.    Procedures/Studies: Dg Orthopantogram  Result Date: 03/07/2019 CLINICAL DATA:  Right-sided facial swelling. EXAM: ORTHOPANTOGRAM/PANORAMIC COMPARISON:  None. FINDINGS: Advanced decay of the remaining right maxillary molar, including periapical lucency. Other residual dentition does not show visible decay or periodontal disease. IMPRESSION: Advanced decay and periapical lucency affecting the remaining right maxillary molar. Electronically Signed   By: Nelson Chimes M.D.   On: 03/07/2019 11:00   Dg Chest 2 View  Result Date: 03/05/2019 CLINICAL DATA:  Facial swelling and known peritonsillar abscess EXAM: CHEST - 2 VIEW COMPARISON:  None. FINDINGS: Cardiac shadows within normal limits. Lungs are well aerated bilaterally. Fullness in the right paratracheal region is noted consistent with the lymphadenopathy seen on recent CT of the neck. No focal infiltrate or effusion is seen. No acute bony abnormality is noted. IMPRESSION: Right paratracheal lymphadenopathy similar to that seen on recent CT examination. No other focal abnormality is noted. Electronically Signed   By: Inez Catalina M.D.   On: 03/05/2019 15:58   Ct Soft Tissue Neck W Contrast  Result Date: 03/05/2019 CLINICAL DATA:  Worsening facial and neck swelling with difficulty swallowing. EXAM: CT NECK WITH CONTRAST TECHNIQUE: Multidetector CT imaging of the neck was performed using the standard protocol following the bolus administration  of intravenous contrast. CONTRAST:  74mL OMNIPAQUE IOHEXOL 300 MG/ML  SOLN COMPARISON:  03/04/2019 FINDINGS: Pharynx and larynx: Symmetric pharyngeal soft tissues without evidence of mass. Diffuse retropharyngeal fluid has increased and now measures up to 2.4 cm in AP thickness at the C3 level with mild mass effect on the posterior oropharynx. No rim enhancement of this fluid. No peritonsillar abscess. Patent airway. Unremarkable larynx. Salivary glands: No inflammation, mass, or stone. Thyroid: Unremarkable. Lymph nodes: Unchanged bilateral cervical lymphadenopathy including level IIa lymph nodes measuring up to 16 mm in short axis on the right and 14 mm on the left as well as a 6.1 x 4.8 cm nodal conglomerate on the right level IV. Similar appearance of partially visualized mediastinal lymphadenopathy including a 6.4 x 4.1 cm right paratracheal mass. Vascular: Major vascular structures in the neck are grossly patent although the right internal jugular vein appears compressed in the lower neck by the nodal mass and is poorly visualized near the confluence with the subclavian vein, partly due to patient body habitus. Limited intracranial: Unremarkable. Visualized orbits: Not imaged. Mastoids and visualized paranasal sinuses: Small left maxillary sinus mucous retention cyst. Clear mastoid air cells. Skeleton: Mild lower cervical spondylosis at C6-7. Advanced facet arthrosis in the included thoracic spine. No suspicious osseous lesion. Upper chest: Clear  lung apices. Other: Multiple dental caries and periapical lucencies including a prominent periapical lucency involving the right lateral maxillary incisor with disruption of the buccal cortex of the alveolar ridge with an unchanged overlying 8 x 6 mm fluid collection and similar appearance of overlying soft tissue inflammation. Fat stranding more inferiorly in the lower face, right greater than left with asymmetric thickening of the platysma on the right, is stable to  slightly increased. IMPRESSION: 1. Odontogenic infection with unchanged 8 x 6 mm subperiosteal abscess associated with the right maxillary incisor. 2. Increased retropharyngeal fluid with mild mass effect. While there is no rim enhancement and a sterile effusion is possible, retropharyngeal abscess is a concern given its size and progression since yesterday. 3. Mildly increased inflammatory stranding/swelling in the lower face and neck without new fluid collection. 4. Unchanged cervical and mediastinal lymphadenopathy including large conglomerate nodal masses in the right lower neck and superior mediastinum concerning for metastatic disease or lymphoproliferative disease. Electronically Signed   By: Logan Bores M.D.   On: 03/05/2019 16:15   Ct Soft Tissue Neck W Contrast  Result Date: 03/04/2019 CLINICAL DATA:  Initial evaluation for acute anterior neck swelling, neck mass. Sore throat. EXAM: CT NECK WITH CONTRAST TECHNIQUE: Multidetector CT imaging of the neck was performed using the standard protocol following the bolus administration of intravenous contrast. CONTRAST:  30mL OMNIPAQUE IOHEXOL 300 MG/ML  SOLN COMPARISON:  None available. FINDINGS: Pharynx and larynx: Oral cavity within normal limits without discrete mass or loculated fluid collection. Prominent periapical lucency with dehiscence of the overlying alveolar ridge present at the right maxillary lateral incisor. Overlying soft tissue swelling with inflammatory stranding concerning for odontogenic infection. Ill-defined hypodense collection within this region measuring 8 x 12 x 11 mm suspicious for early/developing odontogenic abscess (series 2, image 7). Additional hazy inflammatory stranding seen involving the subcutaneous fat inferiorly within the anterior neck, just inferior to the mandible (series 2, image 53). Associated thickening of the platysmas, right greater than left. Findings suspicious for additional site of infection/cellulitis. No  base of tongue lesion. [place] tonsils symmetric and within normal limits. Nasopharynx within normal limits. Moderate retropharyngeal effusion seen diffusely throughout the retropharyngeal soft tissues. No loculated collection or retropharyngeal abscess. Lateral extension into the carotid spaces bilaterally. Mild mass effect on the hypopharynx anteriorly. Supraglottic airway remains patent at this time. No significant associated mucosal edema. Epiglottis normal. Vallecula clear. Remainder of the hypopharynx and supraglottic larynx within normal limits. True cords symmetric and grossly normal. Subglottic airway clear. Salivary glands: Salivary glands including the parotid and submandibular glands are normal. Thyroid: Thyroid within normal limits. Lymph nodes: Increased number of lymph nodes seen within the neck bilaterally. Superimposed enlarged bilateral level II lymph nodes measure up to 16 mm on the right and 13 mm on the left. Level III nodes measure up to 11 mm bilaterally. There is an enlarged soft tissue mass at right level IV measuring 6.2 x 4.7 x 4.6 cm, likely a nodal conglomerate (series 2, image 84). An enlarged nodal conglomerate within the right paratracheal region measures approximately 6.0 x 3.6 x 6.5 cm (series 2, image 117). Few additional scattered enlarged mediastinal lymph nodes noted. Few mildly prominent posterior chain nodes measure up to 9 mm on the right. Vascular: Normal intravascular enhancement seen throughout the neck. Limited intracranial: Unremarkable. Visualized orbits: Not included on this exam. Mastoids and visualized paranasal sinuses: Visualized paranasal sinuses are clear. Mastoid air cells and middle ear cavities are largely well pneumatized and free  of fluid. Skeleton: No acute osseous finding. No discrete lytic or blastic osseous lesions. Mild cervical spondylolysis noted at C6-7. Upper chest: Remainder of the partially visualized upper chest demonstrates no other acute  finding. Partially visualized lungs are clear. Other: None. IMPRESSION: 1. Hazy inflammatory stranding with swelling involving the subcutaneous fat of the lower anterior neck, just inferior to the mandible, suspicious for possible infection/cellulitis. Moderate diffuse retropharyngeal effusion suspected to be reactive. No discrete retropharyngeal abscess. 2. Prominent periapical lucency about the right maxillary lateral incisor with adjacent inflammatory stranding, concerning for acute odontogenic infection. Superimposed 8 x 12 x 11 mm hypodense collection within this region compatible with early odontogenic abscess. 3. Enlarged nodal conglomerates within the right lower neck and right mediastinum measuring up to 6 cm in diameter. Given size, these are felt to be unlikely be reactive in nature, and are most concerning for possible concomitant lymphoproliferative disorder or nodal metastases. No primary mass lesion seen within the neck. Correlation with histology may be helpful for further evaluation. Additional prominent bilateral cervical lymph nodes as above are more indeterminate, and could be reactive. Electronically Signed   By: Jeannine Boga M.D.   On: 03/04/2019 19:57      Subjective: mildly decrease neck swelling. Report pain has improved.    Discharge Exam: Vitals:   03/07/19 1943 03/08/19 0355  BP: (!) 121/51 117/77  Pulse: 93 88  Resp: 18 14  Temp: 97.8 F (36.6 C) 97.9 F (36.6 C)  SpO2: 97% 96%     General: Pt is alert, awake, not in acute distress, neck swelling.  Cardiovascular: RRR, S1/S2 +, no rubs, no gallops Respiratory: CTA bilaterally, no wheezing, no rhonchi Abdominal: Soft, NT, ND, bowel sounds + Extremities: no edema, no cyanosis    The results of significant diagnostics from this hospitalization (including imaging, microbiology, ancillary and laboratory) are listed below for reference.     Microbiology: Recent Results (from the past 240 hour(s))   SARS Coronavirus 2 (CEPHEID - Performed in [facility] hospital lab), Hosp Order     Status: None   Collection Time: 03/05/19  5:10 PM  Result Value Ref Range Status   SARS Coronavirus 2 NEGATIVE NEGATIVE Final    Comment: (NOTE) If result is NEGATIVE SARS-CoV-2 target nucleic acids are NOT DETECTED. The SARS-CoV-2 RNA is generally detectable in upper and lower  respiratory specimens during the acute phase of infection. The lowest  concentration of SARS-CoV-2 viral copies this assay can detect is 250  copies / mL. A negative result does not preclude SARS-CoV-2 infection  and should not be used as the sole basis for treatment or other  patient management decisions.  A negative result may occur with  improper specimen collection / handling, submission of specimen other  than nasopharyngeal swab, presence of viral mutation(s) within the  areas targeted by this assay, and inadequate number of viral copies  (<250 copies / mL). A negative result must be combined with clinical  observations, patient history, and epidemiological information. If result is POSITIVE SARS-CoV-2 target nucleic acids are DETECTED. The SARS-CoV-2 RNA is generally detectable in upper and lower  respiratory specimens dur ing the acute phase of infection.  Positive  results are indicative of active infection with SARS-CoV-2.  Clinical  correlation with patient history and other diagnostic information is  necessary to determine patient infection status.  Positive results do  not rule out bacterial infection or co-infection with other viruses. If result is PRESUMPTIVE POSTIVE SARS-CoV-2 nucleic acids MAY BE  PRESENT.   A presumptive positive result was obtained on the submitted specimen  and confirmed on repeat testing.  While 2019 novel coronavirus  (SARS-CoV-2) nucleic acids may be present in the submitted sample  additional confirmatory testing may be necessary for epidemiological  and / or clinical management  purposes  to differentiate between  SARS-CoV-2 and other Sarbecovirus currently known to infect humans.  If clinically indicated additional testing with an alternate test  methodology [phone]) is advised. The SARS-CoV-2 RNA is generally  detectable in upper and lower respiratory sp ecimens during the acute  phase of infection. The expected result is Negative. Fact Sheet for Patients:  StrictlyIdeas.no Fact Sheet for Healthcare Providers: BankingDealers.co.za This test is not yet approved or cleared by the [place] FDA and has been authorized for detection and/or diagnosis of SARS-CoV-2 by FDA under an Emergency Use Authorization (EUA).  This EUA will remain in effect (meaning this test can be used) for the duration of the COVID-19 declaration under Section 564(b)(1) of the Act, 21 U.S.C. section 360bbb-3(b)(1), unless the authorization is terminated or revoked sooner. Performed at [facility], [address] [place], [place] [address]   MRSA PCR Screening     Status: None   Collection Time: 03/05/19  5:15 PM  Result Value Ref Range Status   MRSA by PCR NEGATIVE NEGATIVE Final    Comment:        The GeneXpert MRSA Assay (FDA approved for NASAL specimens only), is one component of a comprehensive MRSA colonization surveillance program. It is not intended to diagnose MRSA infection nor to guide or monitor treatment for MRSA infections. Performed at [facility], [address] [place], [place] [address]   Culture, blood (routine x 2)     Status: None (Preliminary result)   Collection Time: 03/05/19  6:38 PM  Result Value Ref Range Status   Specimen Description RIGHT ANTECUBITAL  Final   Special Requests   Final    BOTTLES DRAWN AEROBIC AND ANAEROBIC Blood Culture adequate volume   Culture   Final    NO GROWTH 3 DAYS Performed at [facility], [address] [place], [place] [address]    Report Status PENDING  Incomplete   Culture, blood (routine x 2)     Status: None (Preliminary result)   Collection Time: 03/05/19  6:42 PM  Result Value Ref Range Status   Specimen Description LEFT ANTECUBITAL  Final   Special Requests   Final    BOTTLES DRAWN AEROBIC AND ANAEROBIC Blood Culture adequate volume   Culture   Final    NO GROWTH 3 DAYS Performed at [facility], [address] [place], [place] [address]    Report Status PENDING  Incomplete     Labs: BNP (last 3 results) No results for input(s): BNP in the last 8760 hours. Basic Metabolic Panel: Recent Labs  Lab 03/04/19 1545 03/05/19 1410 03/06/19 0217 03/07/19 0239  NA 137 137 135 137  K 4.5 4.0 3.8 3.9  CL 103 103 95* 100  CO2 24 24 28 23   GLUCOSE 113* 125* 102* 87  BUN 24* 19 12 9   CREATININE 0.80 0.81 0.94 0.85  CALCIUM 9.4 9.2 9.2 9.0   Liver Function Tests: No results for input(s): AST, ALT, ALKPHOS, BILITOT, PROT, ALBUMIN in the last 168 hours. No results for input(s): LIPASE, AMYLASE in the last 168 hours. No results for input(s): AMMONIA in the last 168 hours. CBC: Recent Labs  Lab 03/04/19 1545 03/05/19 1410 03/06/19  0217 03/07/19 0239  WBC 16.6* 12.8* 15.4* 11.5*  NEUTROABS 12.3* 9.6*  --   --   HGB 13.0 13.1 13.0 13.1  HCT 41.0 40.9 40.7 40.4  MCV 87.4 87.4 86.6 86.9  PLT 349 322 345 296   Cardiac Enzymes: No results for input(s): CKTOTAL, CKMB, CKMBINDEX, TROPONINI in the last 168 hours. BNP: Invalid input(s): POCBNP CBG: Recent Labs  Lab 03/07/19 0559 03/07/19 0732 03/07/19 1224 03/07/19 1653 03/07/19 1940  GLUCAP 100* 87 97 84 116*   D-Dimer No results for input(s): DDIMER in the last 72 hours. Hgb A1c Recent Labs    03/07/19 0239  HGBA1C 6.2*   Lipid Profile No results for input(s): CHOL, HDL, LDLCALC, TRIG, CHOLHDL, LDLDIRECT in the last 72 hours. Thyroid function studies No results for input(s): TSH, T4TOTAL, T3FREE, THYROIDAB in the last 72 hours.  Invalid input(s): FREET3 Anemia work  up No results for input(s): VITAMINB12, FOLATE, FERRITIN, TIBC, IRON, RETICCTPCT in the last 72 hours. Urinalysis No results found for: COLORURINE, APPEARANCEUR, [place], [place], [place], [place], [place], [place], PROTEINUR, UROBILINOGEN, NITRITE, LEUKOCYTESUR Sepsis Labs Invalid input(s): PROCALCITONIN,  WBC,  LACTICIDVEN Microbiology Recent Results (from the past 240 hour(s))  SARS Coronavirus 2 (CEPHEID - Performed in [facility] hospital lab), Hosp Order     Status: None   Collection Time: 03/05/19  5:10 PM  Result Value Ref Range Status   SARS Coronavirus 2 NEGATIVE NEGATIVE Final    Comment: (NOTE) If result is NEGATIVE SARS-CoV-2 target nucleic acids are NOT DETECTED. The SARS-CoV-2 RNA is generally detectable in upper and lower  respiratory specimens during the acute phase of infection. The lowest  concentration of SARS-CoV-2 viral copies this assay can detect is 250  copies / mL. A negative result does not preclude SARS-CoV-2 infection  and should not be used as the sole basis for treatment or other  patient management decisions.  A negative result may occur with  improper specimen collection / handling, submission of specimen other  than nasopharyngeal swab, presence of viral mutation(s) within the  areas targeted by this assay, and inadequate number of viral copies  (<250 copies / mL). A negative result must be combined with clinical  observations, patient history, and epidemiological information. If result is POSITIVE SARS-CoV-2 target nucleic acids are DETECTED. The SARS-CoV-2 RNA is generally detectable in upper and lower  respiratory specimens dur ing the acute phase of infection.  Positive  results are indicative of active infection with SARS-CoV-2.  Clinical  correlation with patient history and other diagnostic information is  necessary to determine patient infection status.  Positive results do  not rule out bacterial infection or co-infection with other  viruses. If result is PRESUMPTIVE POSTIVE SARS-CoV-2 nucleic acids MAY BE PRESENT.   A presumptive positive result was obtained on the submitted specimen  and confirmed on repeat testing.  While 2019 novel coronavirus  (SARS-CoV-2) nucleic acids may be present in the submitted sample  additional confirmatory testing may be necessary for epidemiological  and / or clinical management purposes  to differentiate between  SARS-CoV-2 and other Sarbecovirus currently known to infect humans.  If clinically indicated additional testing with an alternate test  methodology [phone]) is advised. The SARS-CoV-2 RNA is generally  detectable in upper and lower respiratory sp ecimens during the acute  phase of infection. The expected result is Negative. Fact Sheet for Patients:  StrictlyIdeas.no Fact Sheet for Healthcare Providers: BankingDealers.co.za This test is not yet approved or cleared by the [place] FDA  and has been authorized for detection and/or diagnosis of SARS-CoV-2 by FDA under an Emergency Use Authorization (EUA).  This EUA will remain in effect (meaning this test can be used) for the duration of the COVID-19 declaration under Section 564(b)(1) of the Act, 21 U.S.C. section 360bbb-3(b)(1), unless the authorization is terminated or revoked sooner. Performed at [facility], [address] [place], [place] [address]   MRSA PCR Screening     Status: None   Collection Time: 03/05/19  5:15 PM  Result Value Ref Range Status   MRSA by PCR NEGATIVE NEGATIVE Final    Comment:        The GeneXpert MRSA Assay (FDA approved for NASAL specimens only), is one component of a comprehensive MRSA colonization surveillance program. It is not intended to diagnose MRSA infection nor to guide or monitor treatment for MRSA infections. Performed at [facility], [address], [place], [place] [address]   Culture, blood (routine x 2)     Status:  None (Preliminary result)   Collection Time: 03/05/19  6:38 PM  Result Value Ref Range Status   Specimen Description RIGHT ANTECUBITAL  Final   Special Requests   Final    BOTTLES DRAWN AEROBIC AND ANAEROBIC Blood Culture adequate volume   Culture   Final    NO GROWTH 3 DAYS Performed at [facility], [address], [place], [place] [address]    Report Status PENDING  Incomplete  Culture, blood (routine x 2)     Status: None (Preliminary result)   Collection Time: 03/05/19  6:42 PM  Result Value Ref Range Status   Specimen Description LEFT ANTECUBITAL  Final   Special Requests   Final    BOTTLES DRAWN AEROBIC AND ANAEROBIC Blood Culture adequate volume   Culture   Final    NO GROWTH 3 DAYS Performed at [facility], [address] [place], [place] [place] [id number]    Report Status PENDING  Incomplete     Time coordinating discharge: 40 minutes  SIGNED:   Elmarie Shiley, MD  [facility]

## 2019-03-08 NOTE — Progress Notes (Signed)
   03/08/19 1500  Clinical Encounter Type  Visited With Patient not available  Visit Type Initial  Referral From Chaplain   Rcvd consult late yesterday from [place].  Attempted to visit at 1pm (pt was still in [place] when last checked), but pt had d/c.  Myra Gianotti resident, [phone]

## 2019-03-10 LAB — CULTURE, BLOOD (ROUTINE X 2)
Culture: NO GROWTH
Culture: NO GROWTH
Special Requests: ADEQUATE
Special Requests: ADEQUATE

## 2019-03-11 ENCOUNTER — Emergency Department (HOSPITAL_COMMUNITY): Payer: BLUE CROSS/BLUE SHIELD

## 2019-03-11 ENCOUNTER — Inpatient Hospital Stay (HOSPITAL_COMMUNITY)
Admission: EM | Admit: 2019-03-11 | Discharge: 2019-03-22 | Disposition: A | Discharge status: Home / Self Care | Payer: BLUE CROSS/BLUE SHIELD | Source: Home / Self Care | Attending: Internal Medicine | Admitting: Internal Medicine

## 2019-03-11 ENCOUNTER — Other Ambulatory Visit: Payer: Self-pay

## 2019-03-11 ENCOUNTER — Encounter (HOSPITAL_COMMUNITY): Payer: Self-pay | Admitting: Emergency Medicine

## 2019-03-11 DIAGNOSIS — E66813 Obesity, class 3: Secondary | ICD-10-CM | POA: Diagnosis present

## 2019-03-11 DIAGNOSIS — R590 Localized enlarged lymph nodes: Secondary | ICD-10-CM

## 2019-03-11 DIAGNOSIS — Z8709 Personal history of other diseases of the respiratory system: Secondary | ICD-10-CM

## 2019-03-11 DIAGNOSIS — K047 Periapical abscess without sinus: Secondary | ICD-10-CM | POA: Diagnosis present

## 2019-03-11 DIAGNOSIS — Z720 Tobacco use: Secondary | ICD-10-CM | POA: Diagnosis present

## 2019-03-11 DIAGNOSIS — F32A Depression, unspecified: Secondary | ICD-10-CM | POA: Diagnosis present

## 2019-03-11 DIAGNOSIS — I871 Compression of vein: Secondary | ICD-10-CM

## 2019-03-11 DIAGNOSIS — F329 Major depressive disorder, single episode, unspecified: Secondary | ICD-10-CM | POA: Diagnosis present

## 2019-03-11 DIAGNOSIS — J9859 Other diseases of mediastinum, not elsewhere classified: Secondary | ICD-10-CM | POA: Diagnosis present

## 2019-03-11 DIAGNOSIS — J390 Retropharyngeal and parapharyngeal abscess: Secondary | ICD-10-CM | POA: Diagnosis present

## 2019-03-11 DIAGNOSIS — E7439 Other disorders of intestinal carbohydrate absorption: Secondary | ICD-10-CM | POA: Diagnosis present

## 2019-03-11 DIAGNOSIS — T884XXA Failed or difficult intubation, initial encounter: Secondary | ICD-10-CM

## 2019-03-11 HISTORY — DX: Obesity, class 3: E66.813

## 2019-03-11 HISTORY — DX: Other disorders of intestinal carbohydrate absorption: E74.39

## 2019-03-11 HISTORY — DX: Morbid (severe) obesity due to excess calories: E66.01

## 2019-03-11 LAB — CBC WITH DIFFERENTIAL/PLATELET
Abs Immature Granulocytes: 0.05 10*3/uL (ref 0.00–0.07)
Basophils Absolute: 0.1 10*3/uL (ref 0.0–0.1)
Basophils Relative: 0 %
Eosinophils Absolute: 0.2 10*3/uL (ref 0.0–0.5)
Eosinophils Relative: 1 %
HCT: 41.4 % (ref 36.0–46.0)
Hemoglobin: 13.1 g/dL (ref 12.0–15.0)
Immature Granulocytes: 0 %
Lymphocytes Relative: 15 %
Lymphs Abs: 1.8 10*3/uL (ref 0.7–4.0)
MCH: 28.2 pg (ref 26.0–34.0)
MCHC: 31.6 g/dL (ref 30.0–36.0)
MCV: 89.2 fL (ref 80.0–100.0)
Monocytes Absolute: 0.7 10*3/uL (ref 0.1–1.0)
Monocytes Relative: 6 %
Neutro Abs: 8.8 10*3/uL — ABNORMAL HIGH (ref 1.7–7.7)
Neutrophils Relative %: 78 %
Platelets: 250 10*3/uL (ref 150–400)
RBC: 4.64 MIL/uL (ref 3.87–5.11)
RDW: 15.2 % (ref 11.5–15.5)
WBC: 11.5 10*3/uL — ABNORMAL HIGH (ref 4.0–10.5)
nRBC: 0 % (ref 0.0–0.2)

## 2019-03-11 LAB — COMPREHENSIVE METABOLIC PANEL
ALT: 45 U/L — ABNORMAL HIGH (ref 0–44)
AST: 25 U/L (ref 15–41)
Albumin: 3.9 g/dL (ref 3.5–5.0)
Alkaline Phosphatase: 65 U/L (ref 38–126)
Anion gap: 8 (ref 5–15)
BUN: 16 mg/dL (ref 6–20)
CO2: 24 mmol/L (ref 22–32)
Calcium: 9.3 mg/dL (ref 8.9–10.3)
Chloride: 107 mmol/L (ref 98–111)
Creatinine, Ser: 1.12 mg/dL — ABNORMAL HIGH (ref 0.44–1.00)
GFR calc Af Amer: >60 mL/min (ref >60)
GFR calc non Af Amer: >60 mL/min (ref >60)
Glucose, Bld: 88 mg/dL (ref 70–99)
Potassium: 3.9 mmol/L (ref 3.5–5.1)
Sodium: 139 mmol/L (ref 135–145)
Total Bilirubin: 0.4 mg/dL (ref 0.3–1.2)
Total Protein: 7.8 g/dL (ref 6.5–8.1)

## 2019-03-11 LAB — MONONUCLEOSIS SCREEN: Mono Screen: NEGATIVE

## 2019-03-11 LAB — PHOSPHORUS: Phosphorus: 3.7 mg/dL (ref 2.5–4.6)

## 2019-03-11 LAB — SARS CORONAVIRUS 2 BY RT PCR (HOSPITAL ORDER, PERFORMED IN ~~LOC~~ HOSPITAL LAB): SARS Coronavirus 2: NEGATIVE

## 2019-03-11 LAB — MAGNESIUM: Magnesium: 1.9 mg/dL (ref 1.7–2.4)

## 2019-03-11 LAB — LACTATE DEHYDROGENASE: LDH: 188 U/L (ref 98–192)

## 2019-03-11 LAB — TROPONIN I: Troponin I: <0.03 ng/mL (ref <0.03)

## 2019-03-11 MED ORDER — LORAZEPAM 2 MG/ML IJ SOLN
1.0000 mg | Freq: Once | INTRAMUSCULAR | Status: AC
  Administered 2019-03-11: 1 mg via INTRAVENOUS
  Filled 2019-03-11: qty 1

## 2019-03-11 MED ORDER — ENOXAPARIN SODIUM 40 MG/0.4ML ~~LOC~~ SOLN
40.0000 mg | SUBCUTANEOUS | Status: DC
  Administered 2019-03-12: 40 mg via SUBCUTANEOUS
  Filled 2019-03-11: qty 0.4

## 2019-03-11 MED ORDER — DEXAMETHASONE SODIUM PHOSPHATE 4 MG/ML IJ SOLN
8.0000 mg | Freq: Once | INTRAMUSCULAR | Status: AC
  Administered 2019-03-11: 8 mg via INTRAVENOUS
  Filled 2019-03-11: qty 2

## 2019-03-11 MED ORDER — KETOROLAC TROMETHAMINE 30 MG/ML IJ SOLN
30.0000 mg | Freq: Four times a day (QID) | INTRAMUSCULAR | Status: AC | PRN
  Administered 2019-03-12 – 2019-03-13 (×4): 30 mg via INTRAVENOUS
  Filled 2019-03-11 (×5): qty 1

## 2019-03-11 MED ORDER — IOHEXOL 350 MG/ML SOLN
100.0000 mL | Freq: Once | INTRAVENOUS | Status: AC | PRN
  Administered 2019-03-11: 100 mL via INTRAVENOUS

## 2019-03-11 MED ORDER — HYDROXYZINE HCL 25 MG PO TABS
50.0000 mg | ORAL_TABLET | Freq: Four times a day (QID) | ORAL | Status: DC | PRN
  Administered 2019-03-12 – 2019-03-20 (×13): 50 mg via ORAL
  Filled 2019-03-11 (×14): qty 2

## 2019-03-11 MED ORDER — ONDANSETRON HCL 4 MG PO TABS: 4.0000 mg | ORAL_TABLET | Freq: Four times a day (QID) | ORAL | Status: DC | PRN

## 2019-03-11 MED ORDER — SODIUM CHLORIDE 0.45 % IV SOLN
INTRAVENOUS | Status: DC
  Administered 2019-03-11 – 2019-03-15 (×9): via INTRAVENOUS

## 2019-03-11 MED ORDER — ONDANSETRON HCL 4 MG/2ML IJ SOLN
4.0000 mg | Freq: Four times a day (QID) | INTRAMUSCULAR | Status: DC | PRN
  Administered 2019-03-15: 4 mg via INTRAVENOUS

## 2019-03-11 MED ORDER — SODIUM CHLORIDE 0.9 % IV SOLN: INTRAVENOUS | Status: DC

## 2019-03-11 NOTE — ED Notes (Signed)
Patient ambulated around the nurses station with oxygen saturations never dropping below 96 percent on room air.

## 2019-03-11 NOTE — ED Provider Notes (Signed)
[facility] EMERGENCY DEPARTMENT Provider Note   CSN: [id number] Arrival date & time: 03/11/19  1359    History   Chief Complaint Chief Complaint  Patient presents with  . Shortness of Breath    HPI Tara Aguilar is a 38 y.o. female.     HPI  The patient is a 38 year old female, she is morbidly obese, she has a history of only depression for which she takes Wellbutrin.  She also was recently admitted to the hospital for a 3-day admission after being diagnosed with a posterior pharyngeal infection which was thought to be related to a possible retropharyngeal abscess.  She did not require surgery, did well with IV clindamycin and was discharged home.  She was also noted to have some lymphadenopathy on the CT scan of her neck but this was unclear whether it was related to a possible infection.  She followed up with the maxillofacial surgeon and had 3 teeth removed which was thought to be the source of the patient's ultimate infection.  Since taking the clindamycin she has noticed a slight improvement in some of those symptoms however she still remains tender and swollen in her neck as well as in the right side of her chest.  When she followed up with her family doctor she was advised to come back to the emergency department for evaluation of her chest for specifically a CT scan of the chest.  She is still having shortness of breath which seem to get worse yesterday and today.  She denies fevers, states that she has been able to swallow and speak without changes in her voice and denies any swelling of her legs, hormone therapy or recent travel.  Past Medical History:  Diagnosis Date  . Depression     Patient Active Problem List   Diagnosis Date Noted  . Dental abscess 03/07/2019  . 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 Depression     Past Surgical History:  Procedure Laterality Date  . CESAREAN SECTION       OB History   No obstetric history on  file.      Home Medications    Prior to Admission medications   Medication Sig Start Date End Date Taking? Authorizing Provider  buPROPion (WELLBUTRIN XL) 150 MG 24 hr tablet Take 150 mg by mouth daily.    [provider]  clindamycin (CLEOCIN) 300 MG capsule Take 1 capsule (300 mg total) by mouth 4 (four) times daily for 10 days. 03/08/19 03/18/19  Regalado, Belkys A, MD  ibuprofen (ADVIL) 400 MG tablet Take 800 mg by mouth every 6 (six) hours as needed.    [provider]    Family History History reviewed. No pertinent family history.  Social History Social History   Tobacco Use  . Smoking status: Current Every Day Smoker    Packs/day: 1.00    Years: 13.00    Pack years: 13.00    Types: Cigarettes  . Smokeless tobacco: Never Used  Substance Use Topics  . Alcohol use: Not on file    Comment: social   . Drug use: Not Currently     Allergies   Patient has no known allergies.   Review of Systems Review of Systems  All other systems reviewed and are negative.    Physical Exam Updated Vital Signs BP 118/81 (BP Location: Right Arm)   Pulse (!) 101   Temp 98.2 F (36.8 C) (Oral)   Resp 18   Ht 1.676  m (5\' 6" )   Wt 131.5 kg   LMP 02/21/2019 (Approximate)   SpO2 100%   BMI 46.81 kg/m   Physical Exam Vitals signs and nursing note reviewed.  Constitutional:      General: She is not in acute distress.    Appearance: She is well-developed.  HENT:     Head: Normocephalic and atraumatic.     Comments: The patient is able to open and close her mouth without any trismus or torticollis, she is able to range her neck with some difficulty but states that she is able to swallow.  Her phonation is normal.  She is able to protrude her tongue without difficulty.  She has no tenderness around the lower gums or underneath the tongue, the posterior pharynx is visualized with a tongue depressor and there is slight asymmetry with right tonsil greater than left  tonsil but otherwise normal appearance.  She is tolerating secretions without difficulty.  She does have some tenderness along the upper gingiva but no obvious swelling.    Mouth/Throat:     Pharynx: No oropharyngeal exudate.  Eyes:     General: No scleral icterus.       Right eye: No discharge.        Left eye: No discharge.     Conjunctiva/sclera: Conjunctivae normal.     Pupils: Pupils are equal, round, and reactive to light.  Neck:     Musculoskeletal: Normal range of motion and neck supple.     Thyroid: No thyromegaly.     Vascular: No JVD.  Cardiovascular:     Rate and Rhythm: Normal rate and regular rhythm.     Heart sounds: Normal heart sounds. No murmur. No friction rub. No gallop.   Pulmonary:     Effort: Pulmonary effort is normal. No respiratory distress.     Breath sounds: Normal breath sounds. No wheezing or rales.     Comments: Chest wall examined in the presence of female nurse chaperone, the right breast has some induration and edema compared to the left side.  There is no redness or warmth or tenderness to the breast, no other obvious damage to the skin or injuries or asymmetry Abdominal:     General: Bowel sounds are normal. There is no distension.     Palpations: Abdomen is soft. There is no mass.     Tenderness: There is no abdominal tenderness.  Musculoskeletal: Normal range of motion.        General: No tenderness.     Comments: Slight pitting edema of the bilateral lower extremities which is symmetrical  Lymphadenopathy:     Cervical: No cervical adenopathy.  Skin:    General: Skin is warm and dry.     Findings: No erythema or rash.  Neurological:     Mental Status: She is alert.     Coordination: Coordination normal.  Psychiatric:        Behavior: Behavior normal.      ED Treatments / Results  Labs (all labs ordered are listed, but only abnormal results are displayed) Labs Reviewed - No data to display  EKG None  Radiology No results found.   Procedures .Critical Care Performed by: Noemi Chapel, MD Authorized by: Noemi Chapel, MD   Critical care provider statement:    Critical care time (minutes):  35   Critical care time was exclusive of:  Separately billable procedures and treating other patients and teaching time   Critical care was necessary to treat or prevent  imminent or life-threatening deterioration of the following conditions: SVC syndrome, airway swelling.   Critical care was time spent personally by me on the following activities:  Blood draw for specimens, development of treatment plan with patient or surrogate, discussions with consultants, evaluation of patient's response to treatment, examination of patient, obtaining history from patient or surrogate, ordering and performing treatments and interventions, ordering and review of laboratory studies, ordering and review of radiographic studies, pulse oximetry, re-evaluation of patient's condition and review of old charts   (including critical care time)  Medications Ordered in ED Medications - No data to display   Initial Impression / Assessment and Plan / ED Course  I have reviewed the triage vital signs and the nursing notes.  Pertinent labs & imaging results that were available during my care of the patient were reviewed by me and considered in my medical decision making (see chart for details).       The patient will need to have further evaluation, the edema and abnormalities to her chest wall is suspicious for underlying pathology, would consider cancer or vascular occlusion, the patient is very obese so it is difficult to tell clinically whether there is significant abnormalities of this area.  I agree with doing a CT scan of the chest.  Unfortunately the patient has had significant abnormalities in her throat as well and will need a CT scan of the neck to further evaluate for improvement and evolution of her infection.  She is afebrile, her blood pressure  is 118/81 and she has normal oxygen saturations despite her feeling of shortness of breath.  Her lungs are clear and there is no murmurs.  The patients CT scans are very concerning for malignancy - or lymphoproliferative disease - she has an SVC syndrome.  As well as some compression of the brachiocephalic vein which likely contributes to some of the right chest wall swelling.  I have discussed her care with the oncologist as well as with the hospitalist, the patient will likely need to be transferred to a higher level of care especially since the collection of fluid in the retropharyngeal space is slightly enlarged and compressing somewhat on the supraglottic airway.  She is upright in bed, breathing without difficulty with normal phonation but likely needs to have an expedited work-up and I am concerned about worsening SVC syndrome without admission and more aggressive management.  Discussed with Dr. Olevia Bowens of the hospitalist group who will coordinate admission.  Final Clinical Impressions(s) / ED Diagnoses   Final diagnoses:  SVC (superior vena cava obstruction)  History of retropharyngeal abscess      Noemi Chapel, MD 03/11/19 6226

## 2019-03-11 NOTE — ED Triage Notes (Signed)
Seen 03/05/19 at [place] for peritonsillar abscess.  Surgery done on Tuesday on outpatient.  PCP is requesting c/o pf chest due to swelling to neck, right chest and harding of the right breast.  C/o pain to back of neck, rating pain 4/10 but increases to 8-9/10 at night.  Started with SOB on yesterday with ADL's.

## 2019-03-11 NOTE — H&P (Signed)
History and Physical    Tara Aguilar RSW:[id number] DOB: [birth year] DOA: 03/11/2019  PCP: [facility]   Patient coming from: Home.  I have personally briefly reviewed patient's old medical records in [place]  Chief Complaint: Dyspnea.  HPI: Tara Aguilar is a 38 y.o. female with medical history significant of depression, glucose intolerance, class III obesity who was recently admitted on 03/05/2019 and discharged 3 days later due to a peritonsillar abscess.  She was treated with clindamycin and analgesics.  She underwent teeth extraction on Tuesday and has remained taking her oral clindamycin.  However, the patient states that she has been progressively dyspneic in the past few days, particularly since yesterday.  She has also developed swelling of the right neck, right upper chest and right breast area.  She has been having pain in the area.  However she denies fever, chills, rhinorrhea but feels fatigue.  She occasionally some mild productive sputum.  She denies hemoptysis, wheezing or stridor she denies sick contacts or travel history..  However, she states that she has been having trouble lying down and gets orthopneic quickly.  She denies chest pain, palpitations, diaphoresis, pitting edema of the lower extremities, but has felt lightheaded and has had orthopnea.  Her appetite is decreased.   She denies abdominal pain, nausea, emesis, diarrhea, constipation, melena or hematochezia.  No dysuria, frequency or hematuria.  She denies polyuria, polydipsia, polyphagia or blurred vision.  She has been anxious and having some difficulty sleeping.   ED Course: Initial vital signs temperature 98.2 F, pulse 101, respirations 18, blood pressure 118/81 mmHg and O2 sat 100% on room air.  I ordered Decadron 8 mg IVP x1 and started the patient on 0.45% sodium chloride 125 mL/hr. I was in the room, the patient desaturated to the low 90s twice.  She seems to get mildly dyspneic when  speaking.  White count is 11.5, hemoglobin 13.1 g/dL platelets 250.  CMP showed a creatinine of 1.12 mg/dL and an ALT of 45 units/L.  All other CMP values are within expected limits.  Mono screen was negative.  Troponin I, LDH, magnesium, phosphorus and SARS coronavirus 2 so I have were negative.  Imaging: CT neck soft tissue showed interval extraction of multiple right maxillary teeth.  Mild anterior/inferior neck cellulitis.  Persistent retropharyngeal effusion with mild mass-effect.  There is mild narrowing of the supraglottic airway.  Relatively similar in size cervical and mediastinal lymphadenopathy.  CTA chest did not show any central pulmonary embolism, but lower and distal pulmonary arteries could not be seen clearly and therefore nondiagnostic for this area.  There was very bulky right lower cervical and right superior mediastinal lymphadenopathy, the largest measuring 5.8 cm.  This seems to be obstructing the right brachiocephalic vein and superior vena cava.  Findings are suspicious for malignancy.  There were bilateral nonspecific adrenal nodules concerning for metastatic disease.  Please see images and full radiology report for further detail.  Review of Systems: As per HPI otherwise 10 point review of systems negative.   Past Medical History:  Diagnosis Date   Depression    Glucose intolerance 03/11/2019   Obesity, Class III, BMI 40-49.9 (morbid obesity) ([place]) 03/11/2019    Past Surgical History:  Procedure Laterality Date   CESAREAN SECTION       reports that she has been smoking cigarettes. She has a 13.00 pack-year smoking history. She has never used smokeless tobacco. She reports previous drug use. No history on file for alcohol.  No Known Allergies  Family History  Problem Relation Age of Onset   Lung cancer Mother    Hypertension Mother    Leukemia Father    Myelodysplastic syndrome Father    Prior to Admission medications   Medication Sig Start Date End  Date Taking? Authorizing Provider  buPROPion (WELLBUTRIN XL) 150 MG 24 hr tablet Take 150 mg by mouth daily.   Yes [provider]  clindamycin (CLEOCIN) 300 MG capsule Take 1 capsule (300 mg total) by mouth 4 (four) times daily for 10 days. 03/08/19 03/18/19 Yes Regalado, Belkys A, MD  ibuprofen (ADVIL) 400 MG tablet Take 800 mg by mouth every 6 (six) hours as needed.   Yes [provider]    Physical Exam: Vitals:   03/11/19 1509 03/11/19 1842 03/11/19 1900 03/11/19 1930  BP:  131/82 (!) 132/97 115/68  Pulse:  96 76 66  Resp:  18 18 18   Temp:      TempSrc:      SpO2:  97% 98% 99%  Weight: 131.5 kg     Height: 5\' 6"  (1.676 m)       Constitutional: NAD, calm, comfortable Eyes: PERRL, lids and conjunctivae are mildly injected. ENMT: Mucous membranes are moist. Posterior pharynx shows mild erythema with asymmetric swelling on the right side. Neck: Supple, but with mild decrease in cervical ROM.  Right cervical/supraclavicular area edema.  Positive anterior lymphatic adenopathies. Respiratory: Decreased breath sounds in bases, otherwise clear to auscultation bilaterally, no stridor, no rhonchi, no wheezing, no crackles. Normal respiratory effort. No accessory muscle use.  Cardiovascular: Regular rate and rhythm, no murmurs / rubs / gallops.  Right upper extremity edema. 2+ pedal pulses. No carotid bruits.  Abdomen: Obese, soft, no tenderness, no masses palpated. No hepatosplenomegaly. Bowel sounds positive.  Musculoskeletal: no clubbing / cyanosis.  Good ROM, no contractures. Normal muscle tone.  Skin: no rashes, lesions, ulcers on very limited dermatological examination. Neurologic: CN 2-12 grossly intact. Sensation intact, DTR normal. Strength 5/5 in all 4.  Psychiatric: Normal judgment and insight. Alert and oriented x 3. Normal mood.   Labs on Admission: I have personally reviewed following labs and imaging studies  CBC: Recent Labs  Lab 03/05/19 1410  03/06/19 0217 03/07/19 0239 03/11/19 1609  WBC 12.8* 15.4* 11.5* 11.5*  NEUTROABS 9.6*  --   --  8.8*  HGB 13.1 13.0 13.1 13.1  HCT 40.9 40.7 40.4 41.4  MCV 87.4 86.6 86.9 89.2  PLT 322 345 296 397   Basic Metabolic Panel: Recent Labs  Lab 03/05/19 1410 03/06/19 0217 03/07/19 0239 03/11/19 1609 03/11/19 1805  NA 137 135 137 139  --   K 4.0 3.8 3.9 3.9  --   CL 103 95* 100 107  --   CO2 24 28 23 24   --   GLUCOSE 125* 102* 87 88  --   BUN 19 12 9 16   --   CREATININE 0.81 0.94 0.85 1.12*  --   CALCIUM 9.2 9.2 9.0 9.3  --   MG  --   --   --   --  1.9  PHOS  --   --   --   --  3.7   GFR: Estimated Creatinine Clearance: 93.9 mL/min (A) (by C-G formula based on SCr of 1.12 mg/dL (H)). Liver Function Tests: Recent Labs  Lab 03/11/19 1609  AST 25  ALT 45*  ALKPHOS 65  BILITOT 0.4  PROT 7.8  ALBUMIN 3.9   No results for  input(s): LIPASE, AMYLASE in the last 168 hours. No results for input(s): AMMONIA in the last 168 hours. Coagulation Profile: No results for input(s): INR, PROTIME in the last 168 hours. Cardiac Enzymes: Recent Labs  Lab 03/11/19 1805  TROPONINI <0.03   BNP (last 3 results) No results for input(s): PROBNP in the last 8760 hours. HbA1C: No results for input(s): HGBA1C in the last 72 hours. CBG: Recent Labs  Lab 03/07/19 0559 03/07/19 0732 03/07/19 1224 03/07/19 1653 03/07/19 1940  GLUCAP 100* 87 97 84 116*   Lipid Profile: No results for input(s): CHOL, HDL, LDLCALC, TRIG, CHOLHDL, LDLDIRECT in the last 72 hours. Thyroid Function Tests: No results for input(s): TSH, T4TOTAL, FREET4, T3FREE, THYROIDAB in the last 72 hours. Anemia Panel: No results for input(s): VITAMINB12, FOLATE, FERRITIN, TIBC, IRON, RETICCTPCT in the last 72 hours. Urine analysis: No results found for: COLORURINE, APPEARANCEUR, Gould, [place], GLUCOSEU, HGBUR, BILIRUBINUR, KETONESUR, PROTEINUR, UROBILINOGEN, NITRITE, LEUKOCYTESUR  Radiological Exams on  Admission: Ct Soft Tissue Neck W Contrast  Result Date: 03/11/2019 CLINICAL DATA:  Initial evaluation for neck swelling, recent surgery for odontogenic abscess. EXAM: CT NECK WITH CONTRAST TECHNIQUE: Multidetector CT imaging of the neck was performed using the standard protocol following the bolus administration of intravenous contrast. CONTRAST:  123mL OMNIPAQUE IOHEXOL 350 MG/ML SOLN COMPARISON:  Multiple previous CTs from 03/05/2019 and 03/04/2019. FINDINGS: Pharynx and larynx: Oral cavity within normal limits without mass lesion or collection. Since previous exam, there has been interval extraction of the right maxillary central and lateral incisors as well as the adjacent bicuspid. Residual inflammatory stranding overlies the adjacent maxilla with few small foci of gas, likely postsurgical in nature. No residual abscess now seen. Persistent hazy inflammatory stranding inferiorly along the anterior neck, right slightly worse than left, but definitely progressed as compared to previous exam. Associated thickening of the latissimus musculature bilaterally. No new collection within this region. No base of tongue lesion. No discrete tonsillar or peritonsillar abscess. Nasopharynx within normal limits. There is a persistent fairly sizable retropharyngeal effusion seen throughout the retropharyngeal space, measuring up to 2.5 cm in maximal AP diameter (series 17, image 54). Lateral extension into the carotid spaces bilaterally, with hazy stranding extending towards the lateral aspects of the neck. Overall effusion is perhaps slightly increased in size from previous. No frank rim enhancement or loculation since previous exam. Supraglottic airway mildly narrowed but remains patent, relatively stable from previous. True cords within normal limits. Subglottic airway clear. Salivary glands: Salivary glands including the parotid and submandibular glands are within normal limits. Thyroid: Thyroid normal. Lymph nodes:  Scattered bilateral level II/III lymph nodes again seen, measuring up to 14-15 mm bilaterally. Mildly prominent subcentimeter nodes seen within the right supraclavicular region as well. Few posterior chain nodes noted bilaterally, measuring up to 12 mm on the left (series 14, image 48). Overall, these are relatively similar to previous. Large nodal conglomerate within the right lower neck again seen, measuring 6.4 x 5.7 cm on today's exam. Right paratracheal node partially visualized, measuring 6.1 x 5.0 cm. Vascular: Normal intravascular enhancement seen throughout the neck. Limited intracranial: Unremarkable. Visualized orbits: Visualized globes and orbital soft tissues within normal limits. Mastoids and visualized paranasal sinuses: Small left maxillary and sphenoid sinus retention cyst noted. Mild scattered mucosal thickening noted within the ethmoidal air cells. Visualized paranasal sinuses are otherwise clear. Trace right mastoid effusion noted. Left mastoid air cells clear. Middle ear cavities well pneumatized and clear bilaterally. Skeleton: No acute osseous finding. No discrete lytic  or blastic osseous lesions. Upper chest: Better evaluated on concomitant CT of the chest. Other: None. IMPRESSION: 1. Interval extraction of multiple right maxillary teeth. Residual inflammatory stranding with tiny foci of gas overlying the adjacent right maxilla consistent with postsurgical changes and/or residual infection. No residual abscess or drainable fluid collection identified. Hazy inflammatory stranding within the anterior neck inferiorly compatible with associated cellulitis, progressed relative to 03/05/2019. 2. Persistent retropharyngeal effusion with mild mass effect, slightly increased in size from previous. No new rim enhancement or loculation since previous exam. Supraglottic airway is mildly narrowed but remains patent at this time. 3. Relatively similar cervical and mediastinal lymphadenopathy including  large conglomerate nodal masses in the right lower neck and superior mediastinum, again concerning for metastatic disease or lymphoproliferative disorder. Electronically Signed   By: Jeannine Boga M.D.   On: 03/11/2019 18:52   Ct Angio Chest Pe W And/or Wo Contrast  Result Date: 03/11/2019 CLINICAL DATA:  Peritonsillar abscess, neck, right chest, and right breast swelling, neck pain EXAM: CT ANGIOGRAPHY CHEST WITH CONTRAST TECHNIQUE: Multidetector CT imaging of the chest was performed using the standard protocol during bolus administration of intravenous contrast. Multiplanar CT image reconstructions and MIPs were obtained to evaluate the vascular anatomy. CONTRAST:  154mL OMNIPAQUE IOHEXOL 350 MG/ML SOLN COMPARISON:  CT neck, 03/05/2019 FINDINGS: Cardiovascular: Examination for pulmonary embolism is very limited by body habitus and poor contrast bolus, main pulmonary artery HU = 151. Within this limitation, there is no obvious central pulmonary embolism. Evaluation of the lobar and more distal pulmonary arteries is nondiagnostic. There is bulky lymphadenopathy which appears to obstruct the right brachiocephalic vein and superior vena cava. Normal heart size. No pericardial effusion. Mediastinum/Nodes: Redemonstrated, very bulky lower right cervical and right superior mediastinal lymphadenopathy, largest nodes measuring at least 5.8 cm (series 6, image 27). Thyroid gland, trachea, and esophagus demonstrate no significant findings. Lungs/Pleura: Probable right basilar atelectasis. No pleural effusion or pneumothorax. Upper Abdomen: No acute abnormality. Bilateral adrenal nodules measuring approximately 2.1 cm on the left and 1.3 cm on the right. Musculoskeletal: No chest wall abnormality. No acute or significant osseous findings. Review of the MIP images confirms the above findings. IMPRESSION: 1. Examination for pulmonary embolism is very limited by body habitus and poor contrast bolus, main pulmonary  artery HU = 151. Within this limitation, there is no obvious central pulmonary embolism. Evaluation of the lobar and more distal pulmonary arteries is nondiagnostic. 2. Redemonstrated, very bulky lower right cervical and right superior mediastinal lymphadenopathy, largest nodes measuring at least 5.8 cm (series 6, image 27). This appears to obstruct the right brachiocephalic vein and superior vena cava. Findings are highly suspicious for malignancy, including lymphoma and metastatic disease. 3. Bilateral nonspecific soft tissue adrenal nodules, concerning for metastatic disease although incompletely characterized. 4. Probable right basilar atelectasis without definite acute airspace disease. Electronically Signed   By: Eddie Candle M.D.   On: 03/11/2019 18:34    EKG: Independently reviewed.    Assessment/Plan Principal Problem:   Mass of mediastinum Admit to [facility]/progressive unit. Supplemental oxygen. Dexamethasone 8 mg IVP x1 given earlier. Toradol 30 mg IVP every 6 hours as needed for pain Close monitoring of airway/SVC symptomatology overnight. If the patient has improved might safely transfer to telemetry tomorrow. Continue IVF.  She received IV contrast earlier today while on imaging. Try to obtain CT of abdomen at some point during hospitalization.  Consult for in person or teleconference oncology evaluation. Consult IR for urgent biopsy of mass in a.m.  Active Problems:   Depression Continue Wellbutrin 150 mg p.o. daily.    Retropharyngeal abscess   Dental abscess Switch clindamycin to IV while n.p.o. Toradol for pain.    Glucose intolerance Currently n.p.o.  However, received Decadron 8 mg IVP x1. CBG monitoring every 8 hours while n.p.o. Hemoglobin A1c in the morning.    Obesity, Class III, BMI 40-49.9 (morbid obesity) ([place]) As above. Lifestyle modifications. Check hemoglobin A1c.    Tobacco use Declined nicotine replacement therapy for now. Tobacco cessation  information to be provided by the staff.    DVT prophylaxis: Lovenox SQ. Code Status: Full code. Family Communication: Disposition Plan: Transfer to [facility] for further work-up and evaluation. Consults called: Admission status: Inpatient/progressive unit.   Reubin Milan MD [facility]   03/11/2019, 8:42 PM   This document was prepared using Dragon voice recognition software and may contain some unintended transcription errors.

## 2019-03-11 NOTE — ED Notes (Signed)
Patient refused to be stuck again after asking Dr. Sabra Heck to change her  IV site.

## 2019-03-11 NOTE — ED Notes (Signed)
Patient transported to CT 

## 2019-03-12 ENCOUNTER — Inpatient Hospital Stay (HOSPITAL_COMMUNITY): Payer: BLUE CROSS/BLUE SHIELD

## 2019-03-12 DIAGNOSIS — Z720 Tobacco use: Secondary | ICD-10-CM

## 2019-03-12 DIAGNOSIS — J390 Retropharyngeal and parapharyngeal abscess: Principal | ICD-10-CM

## 2019-03-12 DIAGNOSIS — E7439 Other disorders of intestinal carbohydrate absorption: Secondary | ICD-10-CM

## 2019-03-12 DIAGNOSIS — K047 Periapical abscess without sinus: Secondary | ICD-10-CM

## 2019-03-12 DIAGNOSIS — F329 Major depressive disorder, single episode, unspecified: Secondary | ICD-10-CM

## 2019-03-12 LAB — GLUCOSE, CAPILLARY
Glucose-Capillary: 118 mg/dL — ABNORMAL HIGH (ref 70–99)
Glucose-Capillary: 154 mg/dL — ABNORMAL HIGH (ref 70–99)

## 2019-03-12 LAB — HEMOGLOBIN A1C
Hgb A1c MFr Bld: 6.2 % — ABNORMAL HIGH (ref 4.8–5.6)
Mean Plasma Glucose: 131.24 mg/dL

## 2019-03-12 LAB — BRAIN NATRIURETIC PEPTIDE: B Natriuretic Peptide: 29 pg/mL (ref 0.0–100.0)

## 2019-03-12 MED ORDER — BUPROPION HCL ER (XL) 150 MG PO TB24
150.0000 mg | ORAL_TABLET | Freq: Every day | ORAL | Status: DC
  Administered 2019-03-12 – 2019-03-22 (×11): 150 mg via ORAL
  Filled 2019-03-12 (×11): qty 1

## 2019-03-12 MED ORDER — CLINDAMYCIN PHOSPHATE 900 MG/50ML IV SOLN
900.0000 mg | Freq: Three times a day (TID) | INTRAVENOUS | Status: DC
  Administered 2019-03-12 – 2019-03-18 (×20): 900 mg via INTRAVENOUS
  Filled 2019-03-12 (×20): qty 50

## 2019-03-12 MED ORDER — IOHEXOL 300 MG/ML  SOLN
125.0000 mL | Freq: Once | INTRAMUSCULAR | Status: AC | PRN
  Administered 2019-03-12: 125 mL via INTRAVENOUS

## 2019-03-12 NOTE — Progress Notes (Signed)
PROGRESS NOTE    Tara Aguilar  IDP:[id number] DOB: Jan 31, 1981 DOA: 03/11/2019 PCP: [facility], [facility]   Brief Narrative:  HPI On 03/11/2019 by Dr. Tennis Must Kehinde Totzke is a 38 y.o. female with medical history significant of depression, glucose intolerance, class III obesity who was recently admitted on 03/05/2019 and discharged 3 days later due to a peritonsillar abscess.  She was treated with clindamycin and analgesics.  She underwent teeth extraction on Tuesday and has remained taking her oral clindamycin.  However, the patient states that she has been progressively dyspneic in the past few days, particularly since yesterday.  She has also developed swelling of the right neck, right upper chest and right breast area.  She has been having pain in the area.  However she denies fever, chills, rhinorrhea but feels fatigue.  She occasionally some mild productive sputum.  She denies hemoptysis, wheezing or stridor she denies sick contacts or travel history..  However, she states that she has been having trouble lying down and gets orthopneic quickly.  She denies chest pain, palpitations, diaphoresis, pitting edema of the lower extremities, but has felt lightheaded and has had orthopnea.  Her appetite is decreased.   She denies abdominal pain, nausea, emesis, diarrhea, constipation, melena or hematochezia.  No dysuria, frequency or hematuria.  She denies polyuria, polydipsia, polyphagia or blurred vision.  She has been anxious and having some difficulty sleeping. Assessment & Plan   Mediastinal lymphadenopathy with dyspnea -?  Concern for lymphoma -CTA chest unremarkable for PE.  Very bulky lower right cervical and right superior mediastinal lymphadenopathy largest measuring 5.8 cm.  Appears to obstruct of the right brachiocephalic vein and superior vena cava.  Suspicious for malignancy including lymphoma or metastatic disease. -Interventional radiology consulted and appreciated for possible  biopsy -Discussed with oncology, will need to obtain biopsy -Patient was given 1 dose of dexamethasone 8 mg in the emergency department -Continue pain control -Will obtain CT of the abdomen pelvis -Complaining of dyspnea with minimal exertion.  Will obtain BNP to rule out other causes of possible dyspnea  Recent retropharyngeal/dental abscess -Status post dental extraction -Upon last admission, ENT was consulted, patient was started on clindamycin -Continue clindamycin and pain control  Depression -Continue Wellbutrin  Glucose intolerance/prediabetes -A1c 6.2 -Patient did receive Decadron while in the emergency department -Continue to monitor CBGs, if needed will start insulin sliding scale  Morbid obesity -BMI 86.97 -Patient to discuss lifestyle modifications with PCP  Tobacco abuse -Discussed cessation -Declined nicotine patch  DVT Prophylaxis  lovenox  Code Status: Full  Family Communication: None at bedside  Disposition Plan: Admitted. Pending biopsy.   Consultants Interventional radiology Oncology, Dr. Alvy Bimler, via phone  Procedures  None  Antibiotics   Anti-infectives (From admission, onward)   Start     Dose/Rate Route Frequency Ordered Stop   03/12/19 0200  clindamycin (CLEOCIN) IVPB 900 mg     900 mg 100 mL/hr over 30 Minutes Intravenous Every 8 hours 03/12/19 0105        Subjective:   Tara Aguilar seen and examined today.  Denies current chest pain, shortness of breath with rest, abdominal pain, nausea or vomiting, diarrhea constipation, dizziness or headache.  Does have some pain in her mouth from recent surgery and procedures.  States she is unable to exert herself given her shortness of breath.  She becomes very short winded with minimal activity such as washing dishes.  Objective:   Vitals:   03/11/19 2230 03/12/19 0030 03/12/19 0403 03/12/19 4431  BP: 120/74 (!) 145/94 (!) 143/82 125/85  Pulse: (!) 110 (!) 116 95 91  Resp: 17 19 19 16   Temp:   98 F (36.7 C) 97.9 F (36.6 C) 98.1 F (36.7 C)  TempSrc:  Oral Oral Oral  SpO2: 95% 96% 95% 97%  Weight:  132 kg    Height:        Intake/Output Summary (Last 24 hours) at 03/12/2019 0825 Last data filed at 03/12/2019 0405 Gross per 24 hour  Intake --  Output 800 ml  Net -800 ml   Filed Weights   03/11/19 1509 03/12/19 0030  Weight: 131.5 kg 132 kg    Exam  General: Well developed, well nourished, NAD, appears stated age  HEENT: NCAT, mucous membranes moist. Poor dentition.  Posterior pharynx with mild erythema and swelling on the right side.  Neck: Supple.  Right cervical/supraclavicular area edema  Cardiovascular: S1 S2 auscultated, no rubs, murmurs or gallops. Regular rate and rhythm.  Respiratory: Clear to auscultation bilaterally   Abdomen: Soft, obese, nontender, nondistended, + bowel sounds  Extremities: warm dry without cyanosis clubbing or edema  Neuro: AAOx3, nonfocal  Psych: Normal affect and demeanor    Data Reviewed: I have personally reviewed following labs and imaging studies  CBC: Recent Labs  Lab 03/05/19 1410 03/06/19 0217 03/07/19 0239 03/11/19 1609  WBC 12.8* 15.4* 11.5* 11.5*  NEUTROABS 9.6*  --   --  8.8*  HGB 13.1 13.0 13.1 13.1  HCT 40.9 40.7 40.4 41.4  MCV 87.4 86.6 86.9 89.2  PLT 322 345 296 017   Basic Metabolic Panel: Recent Labs  Lab 03/05/19 1410 03/06/19 0217 03/07/19 0239 03/11/19 1609 03/11/19 1805  NA 137 135 137 139  --   K 4.0 3.8 3.9 3.9  --   CL 103 95* 100 107  --   CO2 24 28 23 24   --   GLUCOSE 125* 102* 87 88  --   BUN 19 12 9 16   --   CREATININE 0.81 0.94 0.85 1.12*  --   CALCIUM 9.2 9.2 9.0 9.3  --   MG  --   --   --   --  1.9  PHOS  --   --   --   --  3.7   GFR: Estimated Creatinine Clearance: 94.1 mL/min (A) (by C-G formula based on SCr of 1.12 mg/dL (H)). Liver Function Tests: Recent Labs  Lab 03/11/19 1609  AST 25  ALT 45*  ALKPHOS 65  BILITOT 0.4  PROT 7.8  ALBUMIN 3.9   No  results for input(s): LIPASE, AMYLASE in the last 168 hours. No results for input(s): AMMONIA in the last 168 hours. Coagulation Profile: No results for input(s): INR, PROTIME in the last 168 hours. Cardiac Enzymes: Recent Labs  Lab 03/11/19 1805  TROPONINI <0.03   BNP (last 3 results) No results for input(s): PROBNP in the last 8760 hours. HbA1C: Recent Labs    03/12/19 0241  HGBA1C 6.2*   CBG: Recent Labs  Lab 03/07/19 0732 03/07/19 1224 03/07/19 1653 03/07/19 1940 03/12/19 0805  GLUCAP 87 97 84 116* 118*   Lipid Profile: No results for input(s): CHOL, HDL, LDLCALC, TRIG, CHOLHDL, LDLDIRECT in the last 72 hours. Thyroid Function Tests: No results for input(s): TSH, T4TOTAL, FREET4, T3FREE, THYROIDAB in the last 72 hours. Anemia Panel: No results for input(s): VITAMINB12, FOLATE, FERRITIN, TIBC, IRON, RETICCTPCT in the last 72 hours. Urine analysis: No results found for: COLORURINE, APPEARANCEUR, [place], [place], [place], [place],  BILIRUBINUR, KETONESUR, PROTEINUR, UROBILINOGEN, NITRITE, LEUKOCYTESUR Sepsis Labs: @LABRCNTIP (procalcitonin:4,lacticidven:4)  ) Recent Results (from the past 240 hour(s))  SARS Coronavirus 2 (CEPHEID - Performed in [place] hospital lab), Hosp Order     Status: None   Collection Time: 03/05/19  5:10 PM  Result Value Ref Range Status   SARS Coronavirus 2 NEGATIVE NEGATIVE Final    Comment: (NOTE) If result is NEGATIVE SARS-CoV-2 target nucleic acids are NOT DETECTED. The SARS-CoV-2 RNA is generally detectable in upper and lower  respiratory specimens during the acute phase of infection. The lowest  concentration of SARS-CoV-2 viral copies this assay can detect is 250  copies / mL. A negative result does not preclude SARS-CoV-2 infection  and should not be used as the sole basis for treatment or other  patient management decisions.  A negative result may occur with  improper specimen collection / handling, submission of specimen  other  than nasopharyngeal swab, presence of viral mutation(s) within the  areas targeted by this assay, and inadequate number of viral copies  (<250 copies / mL). A negative result must be combined with clinical  observations, patient history, and epidemiological information. If result is POSITIVE SARS-CoV-2 target nucleic acids are DETECTED. The SARS-CoV-2 RNA is generally detectable in upper and lower  respiratory specimens dur ing the acute phase of infection.  Positive  results are indicative of active infection with SARS-CoV-2.  Clinical  correlation with patient history and other diagnostic information is  necessary to determine patient infection status.  Positive results do  not rule out bacterial infection or co-infection with other viruses. If result is PRESUMPTIVE POSTIVE SARS-CoV-2 nucleic acids MAY BE PRESENT.   A presumptive positive result was obtained on the submitted specimen  and confirmed on repeat testing.  While 2019 novel coronavirus  (SARS-CoV-2) nucleic acids may be present in the submitted sample  additional confirmatory testing may be necessary for epidemiological  and / or clinical management purposes  to differentiate between  SARS-CoV-2 and other Sarbecovirus currently known to infect humans.  If clinically indicated additional testing with an alternate test  methodology [phone]) is advised. The SARS-CoV-2 RNA is generally  detectable in upper and lower respiratory sp ecimens during the acute  phase of infection. The expected result is Negative. Fact Sheet for Patients:  StrictlyIdeas.no Fact Sheet for Healthcare Providers: BankingDealers.co.za This test is not yet approved or cleared by the [place] FDA and has been authorized for detection and/or diagnosis of SARS-CoV-2 by FDA under an Emergency Use Authorization (EUA).  This EUA will remain in effect (meaning this test can be used) for the duration  of the COVID-19 declaration under Section 564(b)(1) of the Act, 21 U.S.C. section 360bbb-3(b)(1), unless the authorization is terminated or revoked sooner. Performed at [facility], [address], [place], [place] [address]   MRSA PCR Screening     Status: None   Collection Time: 03/05/19  5:15 PM  Result Value Ref Range Status   MRSA by PCR NEGATIVE NEGATIVE Final    Comment:        The GeneXpert MRSA Assay (FDA approved for NASAL specimens only), is one component of a comprehensive MRSA colonization surveillance program. It is not intended to diagnose MRSA infection nor to guide or monitor treatment for MRSA infections. Performed at [facility], [address], [place], [place] [address]   Culture, blood (routine x 2)     Status: None   Collection Time: 03/05/19  6:38 PM  Result Value Ref  Range Status   Specimen Description RIGHT ANTECUBITAL  Final   Special Requests   Final    BOTTLES DRAWN AEROBIC AND ANAEROBIC Blood Culture adequate volume   Culture   Final    NO GROWTH 5 DAYS Performed at [facility], [address], [place], [place] [address]    Report Status 03/10/2019 FINAL  Final  Culture, blood (routine x 2)     Status: None   Collection Time: 03/05/19  6:42 PM  Result Value Ref Range Status   Specimen Description LEFT ANTECUBITAL  Final   Special Requests   Final    BOTTLES DRAWN AEROBIC AND ANAEROBIC Blood Culture adequate volume   Culture   Final    NO GROWTH 5 DAYS Performed at [facility], [address] [place], [place] [address]    Report Status 03/10/2019 FINAL  Final  SARS Coronavirus 2 (CEPHEID - Performed in [place] hospital lab), Hosp Order     Status: None   Collection Time: 03/11/19  8:55 PM  Result Value Ref Range Status   SARS Coronavirus 2 NEGATIVE NEGATIVE Final    Comment: (NOTE) If result is NEGATIVE SARS-CoV-2 target nucleic acids are NOT DETECTED. The SARS-CoV-2 RNA is generally detectable in upper and lower  respiratory  specimens during the acute phase of infection. The lowest  concentration of SARS-CoV-2 viral copies this assay can detect is 250  copies / mL. A negative result does not preclude SARS-CoV-2 infection  and should not be used as the sole basis for treatment or other  patient management decisions.  A negative result may occur with  improper specimen collection / handling, submission of specimen other  than nasopharyngeal swab, presence of viral mutation(s) within the  areas targeted by this assay, and inadequate number of viral copies  (<250 copies / mL). A negative result must be combined with clinical  observations, patient history, and epidemiological information. If result is POSITIVE SARS-CoV-2 target nucleic acids are DETECTED. The SARS-CoV-2 RNA is generally detectable in upper and lower  respiratory specimens dur ing the acute phase of infection.  Positive  results are indicative of active infection with SARS-CoV-2.  Clinical  correlation with patient history and other diagnostic information is  necessary to determine patient infection status.  Positive results do  not rule out bacterial infection or co-infection with other viruses. If result is PRESUMPTIVE POSTIVE SARS-CoV-2 nucleic acids MAY BE PRESENT.   A presumptive positive result was obtained on the submitted specimen  and confirmed on repeat testing.  While 2019 novel coronavirus  (SARS-CoV-2) nucleic acids may be present in the submitted sample  additional confirmatory testing may be necessary for epidemiological  and / or clinical management purposes  to differentiate between  SARS-CoV-2 and other Sarbecovirus currently known to infect humans.  If clinically indicated additional testing with an alternate test  methodology [phone]) is advised. The SARS-CoV-2 RNA is generally  detectable in upper and lower respiratory sp ecimens during the acute  phase of infection. The expected result is Negative. Fact Sheet for  Patients:  StrictlyIdeas.no Fact Sheet for Healthcare Providers: BankingDealers.co.za This test is not yet approved or cleared by the [place] FDA and has been authorized for detection and/or diagnosis of SARS-CoV-2 by FDA under an Emergency Use Authorization (EUA).  This EUA will remain in effect (meaning this test can be used) for the duration of the COVID-19 declaration under Section 564(b)(1) of the Act, 21 U.S.C. section 360bbb-3(b)(1), unless the authorization is terminated or  revoked sooner. Performed at [facility], [address] [place], [place] [address]       Radiology Studies: Ct Soft Tissue Neck W Contrast  Result Date: 03/11/2019 CLINICAL DATA:  Initial evaluation for neck swelling, recent surgery for odontogenic abscess. EXAM: CT NECK WITH CONTRAST TECHNIQUE: Multidetector CT imaging of the neck was performed using the standard protocol following the bolus administration of intravenous contrast. CONTRAST:  115mL OMNIPAQUE IOHEXOL 350 MG/ML SOLN COMPARISON:  Multiple previous CTs from 03/05/2019 and 03/04/2019. FINDINGS: Pharynx and larynx: Oral cavity within normal limits without mass lesion or collection. Since previous exam, there has been interval extraction of the right maxillary central and lateral incisors as well as the adjacent bicuspid. Residual inflammatory stranding overlies the adjacent maxilla with few small foci of gas, likely postsurgical in nature. No residual abscess now seen. Persistent hazy inflammatory stranding inferiorly along the anterior neck, right slightly worse than left, but definitely progressed as compared to previous exam. Associated thickening of the latissimus musculature bilaterally. No new collection within this region. No base of tongue lesion. No discrete tonsillar or peritonsillar abscess. Nasopharynx within normal limits. There is a persistent fairly sizable retropharyngeal effusion seen  throughout the retropharyngeal space, measuring up to 2.5 cm in maximal AP diameter (series 17, image 54). Lateral extension into the carotid spaces bilaterally, with hazy stranding extending towards the lateral aspects of the neck. Overall effusion is perhaps slightly increased in size from previous. No frank rim enhancement or loculation since previous exam. Supraglottic airway mildly narrowed but remains patent, relatively stable from previous. True cords within normal limits. Subglottic airway clear. Salivary glands: Salivary glands including the parotid and submandibular glands are within normal limits. Thyroid: Thyroid normal. Lymph nodes: Scattered bilateral level II/III lymph nodes again seen, measuring up to 14-15 mm bilaterally. Mildly prominent subcentimeter nodes seen within the right supraclavicular region as well. Few posterior chain nodes noted bilaterally, measuring up to 12 mm on the left (series 14, image 48). Overall, these are relatively similar to previous. Large nodal conglomerate within the right lower neck again seen, measuring 6.4 x 5.7 cm on today's exam. Right paratracheal node partially visualized, measuring 6.1 x 5.0 cm. Vascular: Normal intravascular enhancement seen throughout the neck. Limited intracranial: Unremarkable. Visualized orbits: Visualized globes and orbital soft tissues within normal limits. Mastoids and visualized paranasal sinuses: Small left maxillary and sphenoid sinus retention cyst noted. Mild scattered mucosal thickening noted within the ethmoidal air cells. Visualized paranasal sinuses are otherwise clear. Trace right mastoid effusion noted. Left mastoid air cells clear. Middle ear cavities well pneumatized and clear bilaterally. Skeleton: No acute osseous finding. No discrete lytic or blastic osseous lesions. Upper chest: Better evaluated on concomitant CT of the chest. Other: None. IMPRESSION: 1. Interval extraction of multiple right maxillary teeth. Residual  inflammatory stranding with tiny foci of gas overlying the adjacent right maxilla consistent with postsurgical changes and/or residual infection. No residual abscess or drainable fluid collection identified. Hazy inflammatory stranding within the anterior neck inferiorly compatible with associated cellulitis, progressed relative to 03/05/2019. 2. Persistent retropharyngeal effusion with mild mass effect, slightly increased in size from previous. No new rim enhancement or loculation since previous exam. Supraglottic airway is mildly narrowed but remains patent at this time. 3. Relatively similar cervical and mediastinal lymphadenopathy including large conglomerate nodal masses in the right lower neck and superior mediastinum, again concerning for metastatic disease or lymphoproliferative disorder. Electronically Signed   By: Jeannine Boga M.D.   On: 03/11/2019 18:52  Ct Angio Chest Pe W And/or Wo Contrast  Result Date: 03/11/2019 CLINICAL DATA:  Peritonsillar abscess, neck, right chest, and right breast swelling, neck pain EXAM: CT ANGIOGRAPHY CHEST WITH CONTRAST TECHNIQUE: Multidetector CT imaging of the chest was performed using the standard protocol during bolus administration of intravenous contrast. Multiplanar CT image reconstructions and MIPs were obtained to evaluate the vascular anatomy. CONTRAST:  159mL OMNIPAQUE IOHEXOL 350 MG/ML SOLN COMPARISON:  CT neck, 03/05/2019 FINDINGS: Cardiovascular: Examination for pulmonary embolism is very limited by body habitus and poor contrast bolus, main pulmonary artery HU = 151. Within this limitation, there is no obvious central pulmonary embolism. Evaluation of the lobar and more distal pulmonary arteries is nondiagnostic. There is bulky lymphadenopathy which appears to obstruct the right brachiocephalic vein and superior vena cava. Normal heart size. No pericardial effusion. Mediastinum/Nodes: Redemonstrated, very bulky lower right cervical and right  superior mediastinal lymphadenopathy, largest nodes measuring at least 5.8 cm (series 6, image 27). Thyroid gland, trachea, and esophagus demonstrate no significant findings. Lungs/Pleura: Probable right basilar atelectasis. No pleural effusion or pneumothorax. Upper Abdomen: No acute abnormality. Bilateral adrenal nodules measuring approximately 2.1 cm on the left and 1.3 cm on the right. Musculoskeletal: No chest wall abnormality. No acute or significant osseous findings. Review of the MIP images confirms the above findings. IMPRESSION: 1. Examination for pulmonary embolism is very limited by body habitus and poor contrast bolus, main pulmonary artery HU = 151. Within this limitation, there is no obvious central pulmonary embolism. Evaluation of the lobar and more distal pulmonary arteries is nondiagnostic. 2. Redemonstrated, very bulky lower right cervical and right superior mediastinal lymphadenopathy, largest nodes measuring at least 5.8 cm (series 6, image 27). This appears to obstruct the right brachiocephalic vein and superior vena cava. Findings are highly suspicious for malignancy, including lymphoma and metastatic disease. 3. Bilateral nonspecific soft tissue adrenal nodules, concerning for metastatic disease although incompletely characterized. 4. Probable right basilar atelectasis without definite acute airspace disease. Electronically Signed   By: Eddie Candle M.D.   On: 03/11/2019 18:34     Scheduled Meds:  buPROPion  150 mg Oral Daily   enoxaparin (LOVENOX) injection  40 mg Subcutaneous Q24H   Continuous Infusions:  sodium chloride 125 mL/hr at 03/11/19 2046   clindamycin (CLEOCIN) IV 900 mg (03/12/19 0300)     LOS: 1 day   Time Spent in minutes   45 minutes  Basya Casavant D.O. on 03/12/2019 at 8:25 AM  Between 7am to 7pm - Please see pager noted on [email or web address]  After 7pm go to [email or web address]  And look for the night coverage person covering for me after hours  [facility]  [phone]

## 2019-03-12 NOTE — Progress Notes (Signed)
Called radiology for possible IR today for biopsy. IR no longer here. Pt requesting diet. Soft diet ordered as previously ordered. Instructed pt not to eat or drink anything after midnight for possible IR tomorrow.  Amanda Cockayne, RN

## 2019-03-13 LAB — BASIC METABOLIC PANEL
Anion gap: 9 (ref 5–15)
BUN: 17 mg/dL (ref 6–20)
CO2: 23 mmol/L (ref 22–32)
Calcium: 9.2 mg/dL (ref 8.9–10.3)
Chloride: 105 mmol/L (ref 98–111)
Creatinine, Ser: 1.01 mg/dL — ABNORMAL HIGH (ref 0.44–1.00)
GFR calc Af Amer: >60 mL/min (ref >60)
GFR calc non Af Amer: >60 mL/min (ref >60)
Glucose, Bld: 103 mg/dL — ABNORMAL HIGH (ref 70–99)
Potassium: 3.7 mmol/L (ref 3.5–5.1)
Sodium: 137 mmol/L (ref 135–145)

## 2019-03-13 LAB — GLUCOSE, CAPILLARY
Glucose-Capillary: 162 mg/dL — ABNORMAL HIGH (ref 70–99)
Glucose-Capillary: 87 mg/dL (ref 70–99)
Glucose-Capillary: 87 mg/dL (ref 70–99)

## 2019-03-13 LAB — PROTIME-INR
INR: 1.3 — ABNORMAL HIGH (ref 0.8–1.2)
Prothrombin Time: 15.8 seconds — ABNORMAL HIGH (ref 11.4–15.2)

## 2019-03-13 LAB — MAGNESIUM: Magnesium: 1.9 mg/dL (ref 1.7–2.4)

## 2019-03-13 MED ORDER — ENOXAPARIN SODIUM 40 MG/0.4ML ~~LOC~~ SOLN
40.0000 mg | SUBCUTANEOUS | Status: DC
  Administered 2019-03-14 – 2019-03-21 (×8): 40 mg via SUBCUTANEOUS
  Filled 2019-03-13 (×8): qty 0.4

## 2019-03-13 NOTE — Progress Notes (Signed)
Pt heart rate 35 nonsustained, 2.52 sec pause.  Spontaneously returned to [place].  Pt awakened, endorses hx snoring, no known sleep apnea.  Oxygen level 100%.  Will continue to monitor pt closely.

## 2019-03-13 NOTE — Consult Note (Signed)
Chief Complaint: Patient was seen in consultation today for lymphadenopathy  Referring Physician(s): Dr. Ree Kida  Supervising Physician: Sandi Mariscal  Patient Status: [facility] - In-pt  History of Present Illness: Tara Aguilar is a 38 y.o. female with past medical history of depression and obesity class III recently admitted to [facility] with peritonsilar abscess.  She was treated with antibiotics and underwent tooth extraction, however continued to feel worse at home.  She describes progressive shortness of breath that limited daily activities.  She returned to ED for evaluation.   CT Soft Tissue Neck 5/15 showed: 1. Interval extraction of multiple right maxillary teeth. Residual inflammatory stranding with tiny foci of gas overlying the adjacent right maxilla consistent with postsurgical changes and/or residual infection. No residual abscess or drainable fluid collection identified. Hazy inflammatory stranding within the anterior neck inferiorly compatible with associated cellulitis, progressed relative to 03/05/2019. 2. Persistent retropharyngeal effusion with mild mass effect, slightly increased in size from previous. No new rim enhancement or loculation since previous exam. Supraglottic airway is mildly narrowed but remains patent at this time. 3. Relatively similar cervical and mediastinal lymphadenopathy including large conglomerate nodal masses in the right lower neck and superior mediastinum, again concerning for metastatic disease or lymphoproliferative disorder.  IR consulted for lymph node biopsy at the request of Dr. Ree Kida.  Case reviewed and approved by Dr. Pascal Lux.   Past Medical History:  Diagnosis Date   Depression    Glucose intolerance 03/11/2019   Obesity, Class III, BMI 40-49.9 (morbid obesity) ([place]) 03/11/2019    Past Surgical History:  Procedure Laterality Date   CESAREAN SECTION      Allergies: Patient has no known allergies.  Medications: Prior to  Admission medications   Medication Sig Start Date End Date Taking? Authorizing Provider  buPROPion (WELLBUTRIN XL) 150 MG 24 hr tablet Take 150 mg by mouth daily.   Yes [provider]  clindamycin (CLEOCIN) 300 MG capsule Take 1 capsule (300 mg total) by mouth 4 (four) times daily for 10 days. 03/08/19 03/18/19 Yes Regalado, Belkys A, MD  ibuprofen (ADVIL) 400 MG tablet Take 800 mg by mouth every 6 (six) hours as needed.   Yes [provider]     Family History  Problem Relation Age of Onset   Lung cancer Mother    Hypertension Mother    Leukemia Father    Myelodysplastic syndrome Father     Social History   Socioeconomic History   Marital status: Widowed    Spouse name: Not on file   Number of children: 1   Years of education: Not on file   Highest education level: Not on file  Occupational History   Not on file  Social Needs   Financial resource strain: Not on file   Food insecurity:    Worry: Not on file    Inability: Not on file   Transportation needs:    Medical: Not on file    Non-medical: Not on file  Tobacco Use   Smoking status: Current Every Day Smoker    Packs/day: 1.00    Years: 13.00    Pack years: 13.00    Types: Cigarettes   Smokeless tobacco: Never Used  Substance and Sexual Activity   Alcohol use: Not on file    Comment: social    Drug use: Not Currently   Sexual activity: Not on file  Lifestyle   Physical activity:    Days per week: Not on file    Minutes per session:  Not on file   Stress: Not on file  Relationships   Social connections:    Talks on phone: Not on file    Gets together: Not on file    Attends religious service: Not on file    Active member of club or organization: Not on file    Attends meetings of clubs or organizations: Not on file    Relationship status: Not on file  Other Topics Concern   Not on file  Social History Narrative   03-25-19   Husband passed away four months ago from  Cancer of the [place].   Pt. Has 62 yo son at home.     Review of Systems: A 12 point ROS discussed and pertinent positives are indicated in the HPI above.  All other systems are negative.  Review of Systems  Constitutional: Positive for activity change and fatigue. Negative for fever.  Respiratory: Positive for shortness of breath. Negative for cough.   Cardiovascular: Negative for chest pain.  Gastrointestinal: Negative for abdominal pain.  Musculoskeletal: Negative for back pain.  Psychiatric/Behavioral: Negative for behavioral problems and confusion.    Vital Signs: BP 118/73 (BP Location: Right Arm)    Pulse 93    Temp 98.5 F (36.9 C) (Oral)    Resp 17    Ht 5\' 6"  (1.676 m)    Wt 291 lb (132 kg)    LMP 02/21/2019 (Approximate)    SpO2 97%    BMI 46.97 kg/m   Physical Exam Vitals signs and nursing note reviewed.  Constitutional:      General: She is not in acute distress.    Appearance: She is well-developed.  Neck:     Musculoskeletal: Normal range of motion and neck supple.     Comments: No palpable adenopathy Pulmonary:     Effort: Pulmonary effort is normal. No respiratory distress.     Breath sounds: Normal breath sounds.  Skin:    General: Skin is warm and dry.  Neurological:     General: No focal deficit present.     Mental Status: She is alert and oriented to person, place, and time.  Psychiatric:        Mood and Affect: Mood normal. Mood is not anxious.        Behavior: Behavior normal. Behavior is not agitated.      MD Evaluation Airway: Other (comments) Airway comments: recent tooth extraction, peritonsilar abscess Heart: WNL Abdomen: WNL Chest/ Lungs: WNL ASA  Classification: 3 Mallampati/Airway Score: Two   Imaging: Dg Orthopantogram  Result Date: 2019/03/25 CLINICAL DATA:  Right-sided facial swelling. EXAM: ORTHOPANTOGRAM/PANORAMIC COMPARISON:  None. FINDINGS: Advanced decay of the remaining right maxillary molar, including periapical  lucency. Other residual dentition does not show visible decay or periodontal disease. IMPRESSION: Advanced decay and periapical lucency affecting the remaining right maxillary molar. Electronically Signed   By: Nelson Chimes M.D.   On: 03/25/2019 11:00   Dg Chest 2 View  Result Date: 03/05/2019 CLINICAL DATA:  Facial swelling and known peritonsillar abscess EXAM: CHEST - 2 VIEW COMPARISON:  None. FINDINGS: Cardiac shadows within normal limits. Lungs are well aerated bilaterally. Fullness in the right paratracheal region is noted consistent with the lymphadenopathy seen on recent CT of the neck. No focal infiltrate or effusion is seen. No acute bony abnormality is noted. IMPRESSION: Right paratracheal lymphadenopathy similar to that seen on recent CT examination. No other focal abnormality is noted. Electronically Signed   By: Inez Catalina M.D.   On:  03/05/2019 15:58   Ct Soft Tissue Neck W Contrast  Result Date: 03/11/2019 CLINICAL DATA:  Initial evaluation for neck swelling, recent surgery for odontogenic abscess. EXAM: CT NECK WITH CONTRAST TECHNIQUE: Multidetector CT imaging of the neck was performed using the standard protocol following the bolus administration of intravenous contrast. CONTRAST:  179mL OMNIPAQUE IOHEXOL 350 MG/ML SOLN COMPARISON:  Multiple previous CTs from 03/05/2019 and 03/04/2019. FINDINGS: Pharynx and larynx: Oral cavity within normal limits without mass lesion or collection. Since previous exam, there has been interval extraction of the right maxillary central and lateral incisors as well as the adjacent bicuspid. Residual inflammatory stranding overlies the adjacent maxilla with few small foci of gas, likely postsurgical in nature. No residual abscess now seen. Persistent hazy inflammatory stranding inferiorly along the anterior neck, right slightly worse than left, but definitely progressed as compared to previous exam. Associated thickening of the latissimus musculature  bilaterally. No new collection within this region. No base of tongue lesion. No discrete tonsillar or peritonsillar abscess. Nasopharynx within normal limits. There is a persistent fairly sizable retropharyngeal effusion seen throughout the retropharyngeal space, measuring up to 2.5 cm in maximal AP diameter (series 17, image 54). Lateral extension into the carotid spaces bilaterally, with hazy stranding extending towards the lateral aspects of the neck. Overall effusion is perhaps slightly increased in size from previous. No frank rim enhancement or loculation since previous exam. Supraglottic airway mildly narrowed but remains patent, relatively stable from previous. True cords within normal limits. Subglottic airway clear. Salivary glands: Salivary glands including the parotid and submandibular glands are within normal limits. Thyroid: Thyroid normal. Lymph nodes: Scattered bilateral level II/III lymph nodes again seen, measuring up to 14-15 mm bilaterally. Mildly prominent subcentimeter nodes seen within the right supraclavicular region as well. Few posterior chain nodes noted bilaterally, measuring up to 12 mm on the left (series 14, image 48). Overall, these are relatively similar to previous. Large nodal conglomerate within the right lower neck again seen, measuring 6.4 x 5.7 cm on today's exam. Right paratracheal node partially visualized, measuring 6.1 x 5.0 cm. Vascular: Normal intravascular enhancement seen throughout the neck. Limited intracranial: Unremarkable. Visualized orbits: Visualized globes and orbital soft tissues within normal limits. Mastoids and visualized paranasal sinuses: Small left maxillary and sphenoid sinus retention cyst noted. Mild scattered mucosal thickening noted within the ethmoidal air cells. Visualized paranasal sinuses are otherwise clear. Trace right mastoid effusion noted. Left mastoid air cells clear. Middle ear cavities well pneumatized and clear bilaterally. Skeleton: No  acute osseous finding. No discrete lytic or blastic osseous lesions. Upper chest: Better evaluated on concomitant CT of the chest. Other: None. IMPRESSION: 1. Interval extraction of multiple right maxillary teeth. Residual inflammatory stranding with tiny foci of gas overlying the adjacent right maxilla consistent with postsurgical changes and/or residual infection. No residual abscess or drainable fluid collection identified. Hazy inflammatory stranding within the anterior neck inferiorly compatible with associated cellulitis, progressed relative to 03/05/2019. 2. Persistent retropharyngeal effusion with mild mass effect, slightly increased in size from previous. No new rim enhancement or loculation since previous exam. Supraglottic airway is mildly narrowed but remains patent at this time. 3. Relatively similar cervical and mediastinal lymphadenopathy including large conglomerate nodal masses in the right lower neck and superior mediastinum, again concerning for metastatic disease or lymphoproliferative disorder. Electronically Signed   By: Jeannine Boga M.D.   On: 03/11/2019 18:52   Ct Soft Tissue Neck W Contrast  Result Date: 03/05/2019 CLINICAL DATA:  Worsening facial and  neck swelling with difficulty swallowing. EXAM: CT NECK WITH CONTRAST TECHNIQUE: Multidetector CT imaging of the neck was performed using the standard protocol following the bolus administration of intravenous contrast. CONTRAST:  1mL OMNIPAQUE IOHEXOL 300 MG/ML  SOLN COMPARISON:  03/04/2019 FINDINGS: Pharynx and larynx: Symmetric pharyngeal soft tissues without evidence of mass. Diffuse retropharyngeal fluid has increased and now measures up to 2.4 cm in AP thickness at the C3 level with mild mass effect on the posterior oropharynx. No rim enhancement of this fluid. No peritonsillar abscess. Patent airway. Unremarkable larynx. Salivary glands: No inflammation, mass, or stone. Thyroid: Unremarkable. Lymph nodes: Unchanged bilateral  cervical lymphadenopathy including level IIa lymph nodes measuring up to 16 mm in short axis on the right and 14 mm on the left as well as a 6.1 x 4.8 cm nodal conglomerate on the right level IV. Similar appearance of partially visualized mediastinal lymphadenopathy including a 6.4 x 4.1 cm right paratracheal mass. Vascular: Major vascular structures in the neck are grossly patent although the right internal jugular vein appears compressed in the lower neck by the nodal mass and is poorly visualized near the confluence with the subclavian vein, partly due to patient body habitus. Limited intracranial: Unremarkable. Visualized orbits: Not imaged. Mastoids and visualized paranasal sinuses: Small left maxillary sinus mucous retention cyst. Clear mastoid air cells. Skeleton: Mild lower cervical spondylosis at C6-7. Advanced facet arthrosis in the included thoracic spine. No suspicious osseous lesion. Upper chest: Clear lung apices. Other: Multiple dental caries and periapical lucencies including a prominent periapical lucency involving the right lateral maxillary incisor with disruption of the buccal cortex of the alveolar ridge with an unchanged overlying 8 x 6 mm fluid collection and similar appearance of overlying soft tissue inflammation. Fat stranding more inferiorly in the lower face, right greater than left with asymmetric thickening of the platysma on the right, is stable to slightly increased. IMPRESSION: 1. Odontogenic infection with unchanged 8 x 6 mm subperiosteal abscess associated with the right maxillary incisor. 2. Increased retropharyngeal fluid with mild mass effect. While there is no rim enhancement and a sterile effusion is possible, retropharyngeal abscess is a concern given its size and progression since yesterday. 3. Mildly increased inflammatory stranding/swelling in the lower face and neck without new fluid collection. 4. Unchanged cervical and mediastinal lymphadenopathy including large  conglomerate nodal masses in the right lower neck and superior mediastinum concerning for metastatic disease or lymphoproliferative disease. Electronically Signed   By: Logan Bores M.D.   On: 03/05/2019 16:15   Ct Soft Tissue Neck W Contrast  Result Date: 03/04/2019 CLINICAL DATA:  Initial evaluation for acute anterior neck swelling, neck mass. Sore throat. EXAM: CT NECK WITH CONTRAST TECHNIQUE: Multidetector CT imaging of the neck was performed using the standard protocol following the bolus administration of intravenous contrast. CONTRAST:  46mL OMNIPAQUE IOHEXOL 300 MG/ML  SOLN COMPARISON:  None available. FINDINGS: Pharynx and larynx: Oral cavity within normal limits without discrete mass or loculated fluid collection. Prominent periapical lucency with dehiscence of the overlying alveolar ridge present at the right maxillary lateral incisor. Overlying soft tissue swelling with inflammatory stranding concerning for odontogenic infection. Ill-defined hypodense collection within this region measuring 8 x 12 x 11 mm suspicious for early/developing odontogenic abscess (series 2, image 7). Additional hazy inflammatory stranding seen involving the subcutaneous fat inferiorly within the anterior neck, just inferior to the mandible (series 2, image 53). Associated thickening of the platysmas, right greater than left. Findings suspicious for additional site of infection/cellulitis.  No base of tongue lesion. Palatine tonsils symmetric and within normal limits. Nasopharynx within normal limits. Moderate retropharyngeal effusion seen diffusely throughout the retropharyngeal soft tissues. No loculated collection or retropharyngeal abscess. Lateral extension into the carotid spaces bilaterally. Mild mass effect on the hypopharynx anteriorly. Supraglottic airway remains patent at this time. No significant associated mucosal edema. Epiglottis normal. Vallecula clear. Remainder of the hypopharynx and supraglottic larynx  within normal limits. True cords symmetric and grossly normal. Subglottic airway clear. Salivary glands: Salivary glands including the parotid and submandibular glands are normal. Thyroid: Thyroid within normal limits. Lymph nodes: Increased number of lymph nodes seen within the neck bilaterally. Superimposed enlarged bilateral level II lymph nodes measure up to 16 mm on the right and 13 mm on the left. Level III nodes measure up to 11 mm bilaterally. There is an enlarged soft tissue mass at right level IV measuring 6.2 x 4.7 x 4.6 cm, likely a nodal conglomerate (series 2, image 84). An enlarged nodal conglomerate within the right paratracheal region measures approximately 6.0 x 3.6 x 6.5 cm (series 2, image 117). Few additional scattered enlarged mediastinal lymph nodes noted. Few mildly prominent posterior chain nodes measure up to 9 mm on the right. Vascular: Normal intravascular enhancement seen throughout the neck. Limited intracranial: Unremarkable. Visualized orbits: Not included on this exam. Mastoids and visualized paranasal sinuses: Visualized paranasal sinuses are clear. Mastoid air cells and middle ear cavities are largely well pneumatized and free of fluid. Skeleton: No acute osseous finding. No discrete lytic or blastic osseous lesions. Mild cervical spondylolysis noted at C6-7. Upper chest: Remainder of the partially visualized upper chest demonstrates no other acute finding. Partially visualized lungs are clear. Other: None. IMPRESSION: 1. Hazy inflammatory stranding with swelling involving the subcutaneous fat of the lower anterior neck, just inferior to the mandible, suspicious for possible infection/cellulitis. Moderate diffuse retropharyngeal effusion suspected to be reactive. No discrete retropharyngeal abscess. 2. Prominent periapical lucency about the right maxillary lateral incisor with adjacent inflammatory stranding, concerning for acute odontogenic infection. Superimposed 8 x 12 x 11 mm  hypodense collection within this region compatible with early odontogenic abscess. 3. Enlarged nodal conglomerates within the right lower neck and right mediastinum measuring up to 6 cm in diameter. Given size, these are felt to be unlikely be reactive in nature, and are most concerning for possible concomitant lymphoproliferative disorder or nodal metastases. No primary mass lesion seen within the neck. Correlation with histology may be helpful for further evaluation. Additional prominent bilateral cervical lymph nodes as above are more indeterminate, and could be reactive. Electronically Signed   By: Jeannine Boga M.D.   On: 03/04/2019 19:57   Ct Angio Chest Pe W And/or Wo Contrast  Result Date: 03/11/2019 CLINICAL DATA:  Peritonsillar abscess, neck, right chest, and right breast swelling, neck pain EXAM: CT ANGIOGRAPHY CHEST WITH CONTRAST TECHNIQUE: Multidetector CT imaging of the chest was performed using the standard protocol during bolus administration of intravenous contrast. Multiplanar CT image reconstructions and MIPs were obtained to evaluate the vascular anatomy. CONTRAST:  13mL OMNIPAQUE IOHEXOL 350 MG/ML SOLN COMPARISON:  CT neck, 03/05/2019 FINDINGS: Cardiovascular: Examination for pulmonary embolism is very limited by body habitus and poor contrast bolus, main pulmonary artery HU = 151. Within this limitation, there is no obvious central pulmonary embolism. Evaluation of the lobar and more distal pulmonary arteries is nondiagnostic. There is bulky lymphadenopathy which appears to obstruct the right brachiocephalic vein and superior vena cava. Normal heart size. No pericardial effusion. Mediastinum/Nodes:  Redemonstrated, very bulky lower right cervical and right superior mediastinal lymphadenopathy, largest nodes measuring at least 5.8 cm (series 6, image 27). Thyroid gland, trachea, and esophagus demonstrate no significant findings. Lungs/Pleura: Probable right basilar atelectasis. No  pleural effusion or pneumothorax. Upper Abdomen: No acute abnormality. Bilateral adrenal nodules measuring approximately 2.1 cm on the left and 1.3 cm on the right. Musculoskeletal: No chest wall abnormality. No acute or significant osseous findings. Review of the MIP images confirms the above findings. IMPRESSION: 1. Examination for pulmonary embolism is very limited by body habitus and poor contrast bolus, main pulmonary artery HU = 151. Within this limitation, there is no obvious central pulmonary embolism. Evaluation of the lobar and more distal pulmonary arteries is nondiagnostic. 2. Redemonstrated, very bulky lower right cervical and right superior mediastinal lymphadenopathy, largest nodes measuring at least 5.8 cm (series 6, image 27). This appears to obstruct the right brachiocephalic vein and superior vena cava. Findings are highly suspicious for malignancy, including lymphoma and metastatic disease. 3. Bilateral nonspecific soft tissue adrenal nodules, concerning for metastatic disease although incompletely characterized. 4. Probable right basilar atelectasis without definite acute airspace disease. Electronically Signed   By: Eddie Candle M.D.   On: 03/11/2019 18:34   Ct Abdomen Pelvis W Contrast  Result Date: 03/12/2019 CLINICAL DATA:  38 year old with bulky lymphadenopathy in the RIGHT LOWER neck and in the RIGHT superior mediastinum. Evaluate for abdominopelvic lymphadenopathy. EXAM: CT ABDOMEN AND PELVIS WITH CONTRAST TECHNIQUE: Multidetector CT imaging of the abdomen and pelvis was performed using the standard protocol following bolus administration of intravenous contrast. CONTRAST:  126mL OMNIPAQUE IOHEXOL 300 MG/ML IV. Oral contrast was also administered. COMPARISON:  No prior abdominopelvic CT. CTA chest 03/11/2019 is correlated. FINDINGS: Lower chest: Dense consolidation dependently in the POSTERIOR RIGHT LOWER LOBE. Visualized lung bases otherwise clear. Normal heart size. Hepatobiliary:  Borderline hepatomegaly. No focal hepatic parenchymal abnormality. Normal anatomic variant in that the LEFT lobe of the liver crosses the midline into the LEFT UPPER QUADRANT. Gallbladder normal in appearance without calcified gallstones. No biliary ductal dilation. Pancreas: Normal in appearance without evidence of mass, ductal dilation, or inflammation. Spleen: Normal in size and appearance. Adrenals/Urinary Tract: BILATERAL adrenal masses, that on the LEFT measuring approximately 1.6 x 2.5 x 2.2 cm and that on the RIGHT measuring approximately 1.3 x 2.1 x 2.7 cm. Kidneys normal in size and appearance without focal parenchymal abnormality. No hydronephrosis. No evidence of urinary tract calculi. Normal appearing urinary bladder. Stomach/Bowel: Stomach normal in appearance for the degree of distention. Normal-appearing small bowel. Moderate stool burden throughout the normal appearing colon. Normal appendix in the RIGHT UPPER pelvis filled with oral contrast. Vascular/Lymphatic: Minimal atherosclerosis involving the RIGHT common iliac artery. No evidence of abdominal aortic atherosclerosis or aneurysm. Normal-appearing portal venous and systemic venous systems. 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 No pathologic lymphadenopathy within the abdomen or pelvis. Reproductive: Normal-appearing uterus and ovaries without evidence of adnexal mass. Other: Edema involving the LATERAL subcutaneous tissues of the LOWER chest and upper abdomen. Musculoskeletal: Degenerative disc disease and spondylosis involving the visualized thoracic spine. Chronic L5-S1 disc protrusion with calcification in the POSTERIOR annular fibers. No acute findings. IMPRESSION: 1. No acute abnormalities involving the abdomen or pelvis. 2. Borderline hepatomegaly without focal hepatic parenchymal abnormality. 3. Bilateral adrenal masses, measured above.  Given the fact that the patient may have a malignancy, a dedicated CT of the abdomen without  and with contrast (adrenal protocol) is recommended in further evaluation. This recommendation follows ACR consensus guidelines: Managing Incidental Findings on Abdominal CT: White Paper of the ACR Incidental Findings Committee. J Am Coll Radiol 2010;7:754-773. Electronically Signed   By: Evangeline Dakin M.D.   On: 03/12/2019 21:49    Labs:  CBC: Recent Labs    03/05/19 1410 03/06/19 0217 03/07/19 0239 03/11/19 1609  WBC 12.8* 15.4* 11.5* 11.5*  HGB 13.1 13.0 13.1 13.1  HCT 40.9 40.7 40.4 41.4  PLT 322 345 296 250    COAGS: No results for input(s): INR, APTT in the last 8760 hours.  BMP: Recent Labs    03/06/19 0217 03/07/19 0239 03/11/19 1609 03/13/19 0354  NA 135 137 139 137  K 3.8 3.9 3.9 3.7  CL 95* 100 107 105  CO2 28 23 24 23   GLUCOSE 102* 87 88 103*  BUN 12 9 16 17   CALCIUM 9.2 9.0 9.3 9.2  CREATININE 0.94 0.85 1.12* 1.01*  GFRNONAA >60 >60 >60 >60  GFRAA >60 >60 >60 >60    LIVER FUNCTION TESTS: Recent Labs    03/11/19 1609  BILITOT 0.4  AST 25  ALT 45*  ALKPHOS 65  PROT 7.8  ALBUMIN 3.9    TUMOR MARKERS: No results for input(s): AFPTM, CEA, CA199, CHROMGRNA in the last 8760 hours.  Assessment and Plan: Lymphadenopathy Patient with recent peritonsilar abscess s/p teeth extraction and antibiotics. Despite treatment, she continued to have shortness of breath, fatigue at home.  Now with lymphadenopathy and concerns for lymphoproliferative vs. Metastatic disease.  Patient NPO p MN for possible biopsy in IR tomorrow.  Case reviewed by Dr. Pascal Lux who approves biopsy; likely R supraclavicular node.  INR pending.  Lovenox held.  Risks and benefits discussed with the patient and/or patient's family including, but not limited to bleeding, infection, damage to adjacent structures or low yield requiring additional tests.  All of the questions were answered and  there is agreement to proceed.  Consent signed and in chart.   Thank you for this interesting consult.  I greatly enjoyed meeting Tara Aguilar and look forward to participating in their care.  A copy of this report was sent to the requesting provider on this date.  Electronically Signed: Docia Barrier, PA 03/13/2019, 12:48 PM   I spent a total of 40 Minutes    in face to face in clinical consultation, greater than 50% of which was counseling/coordinating care for lymphadenopathy.

## 2019-03-13 NOTE — Progress Notes (Signed)
PROGRESS NOTE    Tara Aguilar  JYN:[id number] DOB: Sep 20, 1981 DOA: 03/11/2019 PCP: [facility]   Brief Narrative:  HPI On 03/11/2019 by Dr. Tennis Must Tara Aguilar is a 38 y.o. female with medical history significant of depression, glucose intolerance, class III obesity who was recently admitted on 03/05/2019 and discharged 3 days later due to a peritonsillar abscess.  She was treated with clindamycin and analgesics.  She underwent teeth extraction on Tuesday and has remained taking her oral clindamycin.  However, the patient states that she has been progressively dyspneic in the past few days, particularly since yesterday.  She has also developed swelling of the right neck, right upper chest and right breast area.  She has been having pain in the area.  However she denies fever, chills, rhinorrhea but feels fatigue.  She occasionally some mild productive sputum.  She denies hemoptysis, wheezing or stridor she denies sick contacts or travel history..  However, she states that she has been having trouble lying down and gets orthopneic quickly.  She denies chest pain, palpitations, diaphoresis, pitting edema of the lower extremities, but has felt lightheaded and has had orthopnea.  Her appetite is decreased.   She denies abdominal pain, nausea, emesis, diarrhea, constipation, melena or hematochezia.  No dysuria, frequency or hematuria.  She denies polyuria, polydipsia, polyphagia or blurred vision.  She has been anxious and having some difficulty sleeping. Assessment & Plan   Mediastinal lymphadenopathy with dyspnea -?  Concern for lymphoma -CTA chest unremarkable for PE.  Very bulky lower right cervical and right superior mediastinal lymphadenopathy largest measuring 5.8 cm.  Appears to obstruct of the right brachiocephalic vein and superior vena cava.  Suspicious for malignancy including lymphoma or metastatic disease. -Interventional radiology consulted and appreciated for possible biopsy-  likely to occur on 03/14/2019 -Discussed with oncology, will need to obtain biopsy -Patient was given 1 dose of dexamethasone 8 mg in the emergency department -Continue pain control -Complaining of dyspnea with minimal exertion.   -BNP 29 -CT abd/pelvis: no acute abnormalities in the abd or pelvis. B/L adrenal masses  Recent retropharyngeal/dental abscess -Status post dental extraction -Upon last admission, ENT was consulted, patient was started on clindamycin -Continue clindamycin and pain control  Depression -Continue Wellbutrin  Glucose intolerance/prediabetes -A1c 6.2 -Patient did receive Decadron while in the emergency department -Continue to monitor CBGs, if needed will start insulin sliding scale  Morbid obesity -BMI 86.97 -Patient to discuss lifestyle modifications with PCP  Tobacco abuse -Discussed cessation -Declined nicotine patch  Undiagnosed sleep apnea -overnight patient HR drop to 35, with 2.52 second pause- asymptomatic -tells me she snores and has never had a sleep study  DVT Prophylaxis  lovenox  Code Status: Full  Family Communication: None at bedside  Disposition Plan: Admitted. Pending biopsy- likely on 5/18. Home when stable  Consultants Interventional radiology Oncology, Dr. Alvy Bimler, via phone  Procedures  None  Antibiotics   Anti-infectives (From admission, onward)   Start     Dose/Rate Route Frequency Ordered Stop   03/12/19 0200  clindamycin (CLEOCIN) IVPB 900 mg     900 mg 100 mL/hr over 30 Minutes Intravenous Every 8 hours 03/12/19 0105        Subjective:   Tara Aguilar seen and examined today.  Continues to have shortness of breath, particularly with movement. Denies current chest pain, abdominal pain, N/V/D/C. Complains of feeling "fullness" along right arm, breast. States he father had MDS and leukemia in his 57s.    Objective:  Vitals:   03/12/19 1934 03/13/19 0043 03/13/19 0444 03/13/19 0752  BP: 128/80 (!) 121/58 132/75  116/70  Pulse: 98 96 91 85  Resp: (!) 24 (!) 23 13 17   Temp: (!) 97.5 F (36.4 C) 97.6 F (36.4 C) 97.6 F (36.4 C) 97.6 F (36.4 C)  TempSrc: Oral Oral Oral Oral  SpO2: 98% 96% 100% 95%  Weight:      Height:        Intake/Output Summary (Last 24 hours) at 03/13/2019 0755 Last data filed at 03/13/2019 0600 Gross per 24 hour  Intake 3703.6 ml  Output 1300 ml  Net 2403.6 ml   Filed Weights   03/11/19 1509 03/12/19 0030  Weight: 131.5 kg 132 kg   Exam  General: Well developed, well nourished, NAD, appears stated age  HEENT: NCAT, mucous membranes moist. Poor dentition  Neck: Supple. Fullness on the right, cervical and supraclavicular edema  Cardiovascular: S1 S2 auscultated, RRR, no murmur  Breast: right breast edema, lateral side  Respiratory: Clear to auscultation bilaterally  Abdomen: Soft, obese, nontender, nondistended, + bowel sounds  Extremities: warm dry without cyanosis clubbing or edema  Neuro: AAOx3, nonfocal  Psych: Appropriate mood and affect  Data Reviewed: I have personally reviewed following labs and imaging studies  CBC: Recent Labs  Lab 03/07/19 0239 03/11/19 1609  WBC 11.5* 11.5*  NEUTROABS  --  8.8*  HGB 13.1 13.1  HCT 40.4 41.4  MCV 86.9 89.2  PLT 296 277   Basic Metabolic Panel: Recent Labs  Lab 03/07/19 0239 03/11/19 1609 03/11/19 1805 03/13/19 0354  NA 137 139  --  137  K 3.9 3.9  --  3.7  CL 100 107  --  105  CO2 23 24  --  23  GLUCOSE 87 88  --  103*  BUN 9 16  --  17  CREATININE 0.85 1.12*  --  1.01*  CALCIUM 9.0 9.3  --  9.2  MG  --   --  1.9  --   PHOS  --   --  3.7  --    GFR: Estimated Creatinine Clearance: 104.4 mL/min (A) (by C-G formula based on SCr of 1.01 mg/dL (H)). Liver Function Tests: Recent Labs  Lab 03/11/19 1609  AST 25  ALT 45*  ALKPHOS 65  BILITOT 0.4  PROT 7.8  ALBUMIN 3.9   No results for input(s): LIPASE, AMYLASE in the last 168 hours. No results for input(s): AMMONIA in the last  168 hours. Coagulation Profile: No results for input(s): INR, PROTIME in the last 168 hours. Cardiac Enzymes: Recent Labs  Lab 03/11/19 1805  TROPONINI <0.03   BNP (last 3 results) No results for input(s): PROBNP in the last 8760 hours. HbA1C: Recent Labs    03/12/19 0241  HGBA1C 6.2*   CBG: Recent Labs  Lab 03/07/19 1653 03/07/19 1940 03/12/19 0805 03/12/19 1613 03/13/19 0044  GLUCAP 84 116* 118* 154* 162*   Lipid Profile: No results for input(s): CHOL, HDL, LDLCALC, TRIG, CHOLHDL, LDLDIRECT in the last 72 hours. Thyroid Function Tests: No results for input(s): TSH, T4TOTAL, FREET4, T3FREE, THYROIDAB in the last 72 hours. Anemia Panel: No results for input(s): VITAMINB12, FOLATE, FERRITIN, TIBC, IRON, RETICCTPCT in the last 72 hours. Urine analysis: No results found for: COLORURINE, APPEARANCEUR, LABSPEC, PHURINE, GLUCOSEU, HGBUR, BILIRUBINUR, KETONESUR, PROTEINUR, UROBILINOGEN, NITRITE, LEUKOCYTESUR Sepsis Labs: @LABRCNTIP (procalcitonin:4,lacticidven:4)  ) Recent Results (from the past 240 hour(s))  SARS Coronavirus 2 (CEPHEID - Performed in [facility] lab), [facility]  Order     Status: None   Collection Time: 03/05/19  5:10 PM  Result Value Ref Range Status   SARS Coronavirus 2 NEGATIVE NEGATIVE Final    Comment: (NOTE) If result is NEGATIVE SARS-CoV-2 target nucleic acids are NOT DETECTED. The SARS-CoV-2 RNA is generally detectable in upper and lower  respiratory specimens during the acute phase of infection. The lowest  concentration of SARS-CoV-2 viral copies this assay can detect is 250  copies / mL. A negative result does not preclude SARS-CoV-2 infection  and should not be used as the sole basis for treatment or other  patient management decisions.  A negative result may occur with  improper specimen collection / handling, submission of specimen other  than nasopharyngeal swab, presence of viral mutation(s) within the  areas targeted by this assay,  and inadequate number of viral copies  (<250 copies / mL). A negative result must be combined with clinical  observations, patient history, and epidemiological information. If result is POSITIVE SARS-CoV-2 target nucleic acids are DETECTED. The SARS-CoV-2 RNA is generally detectable in upper and lower  respiratory specimens dur ing the acute phase of infection.  Positive  results are indicative of active infection with SARS-CoV-2.  Clinical  correlation with patient history and other diagnostic information is  necessary to determine patient infection status.  Positive results do  not rule out bacterial infection or co-infection with other viruses. If result is PRESUMPTIVE POSTIVE SARS-CoV-2 nucleic acids MAY BE PRESENT.   A presumptive positive result was obtained on the submitted specimen  and confirmed on repeat testing.  While 2019 novel coronavirus  (SARS-CoV-2) nucleic acids may be present in the submitted sample  additional confirmatory testing may be necessary for epidemiological  and / or clinical management purposes  to differentiate between  SARS-CoV-2 and other Sarbecovirus currently known to infect humans.  If clinically indicated additional testing with an alternate test  methodology [phone]) is advised. The SARS-CoV-2 RNA is generally  detectable in upper and lower respiratory sp ecimens during the acute  phase of infection. The expected result is Negative. Fact Sheet for Patients:  StrictlyIdeas.no Fact Sheet for Healthcare Providers: BankingDealers.co.za This test is not yet approved or cleared by the [place] FDA and has been authorized for detection and/or diagnosis of SARS-CoV-2 by FDA under an Emergency Use Authorization (EUA).  This EUA will remain in effect (meaning this test can be used) for the duration of the COVID-19 declaration under Section 564(b)(1) of the Act, 21 U.S.C. section 360bbb-3(b)(1), unless  the authorization is terminated or revoked sooner. Performed at [facility], [address], [place], [place] [address]   MRSA PCR Screening     Status: None   Collection Time: 03/05/19  5:15 PM  Result Value Ref Range Status   MRSA by PCR NEGATIVE NEGATIVE Final    Comment:        The GeneXpert MRSA Assay (FDA approved for NASAL specimens only), is one component of a comprehensive MRSA colonization surveillance program. It is not intended to diagnose MRSA infection nor to guide or monitor treatment for MRSA infections. Performed at [facility], [address] [place], [place] [address]   Culture, blood (routine x 2)     Status: None   Collection Time: 03/05/19  6:38 PM  Result Value Ref Range Status   Specimen Description RIGHT ANTECUBITAL  Final   Special Requests   Final    BOTTLES DRAWN AEROBIC AND ANAEROBIC Blood Culture adequate volume  Culture   Final    NO GROWTH 5 DAYS Performed at [facility], [address], [place], [place] [address]    Report Status 03/10/2019 FINAL  Final  Culture, blood (routine x 2)     Status: None   Collection Time: 03/05/19  6:42 PM  Result Value Ref Range Status   Specimen Description LEFT ANTECUBITAL  Final   Special Requests   Final    BOTTLES DRAWN AEROBIC AND ANAEROBIC Blood Culture adequate volume   Culture   Final    NO GROWTH 5 DAYS Performed at [facility], [address] [place], [place] [address]    Report Status 03/10/2019 FINAL  Final  SARS Coronavirus 2 (CEPHEID - Performed in [facility] hospital lab), Hosp Order     Status: None   Collection Time: 03/11/19  8:55 PM  Result Value Ref Range Status   SARS Coronavirus 2 NEGATIVE NEGATIVE Final    Comment: (NOTE) If result is NEGATIVE SARS-CoV-2 target nucleic acids are NOT DETECTED. The SARS-CoV-2 RNA is generally detectable in upper and lower  respiratory specimens during the acute phase of infection. The lowest  concentration of SARS-CoV-2 viral copies this assay  can detect is 250  copies / mL. A negative result does not preclude SARS-CoV-2 infection  and should not be used as the sole basis for treatment or other  patient management decisions.  A negative result may occur with  improper specimen collection / handling, submission of specimen other  than nasopharyngeal swab, presence of viral mutation(s) within the  areas targeted by this assay, and inadequate number of viral copies  (<250 copies / mL). A negative result must be combined with clinical  observations, patient history, and epidemiological information. If result is POSITIVE SARS-CoV-2 target nucleic acids are DETECTED. The SARS-CoV-2 RNA is generally detectable in upper and lower  respiratory specimens dur ing the acute phase of infection.  Positive  results are indicative of active infection with SARS-CoV-2.  Clinical  correlation with patient history and other diagnostic information is  necessary to determine patient infection status.  Positive results do  not rule out bacterial infection or co-infection with other viruses. If result is PRESUMPTIVE POSTIVE SARS-CoV-2 nucleic acids MAY BE PRESENT.   A presumptive positive result was obtained on the submitted specimen  and confirmed on repeat testing.  While 2019 novel coronavirus  (SARS-CoV-2) nucleic acids may be present in the submitted sample  additional confirmatory testing may be necessary for epidemiological  and / or clinical management purposes  to differentiate between  SARS-CoV-2 and other Sarbecovirus currently known to infect humans.  If clinically indicated additional testing with an alternate test  methodology [phone]) is advised. The SARS-CoV-2 RNA is generally  detectable in upper and lower respiratory sp ecimens during the acute  phase of infection. The expected result is Negative. Fact Sheet for Patients:  StrictlyIdeas.no Fact Sheet for Healthcare  Providers: BankingDealers.co.za This test is not yet approved or cleared by the [place] FDA and has been authorized for detection and/or diagnosis of SARS-CoV-2 by FDA under an Emergency Use Authorization (EUA).  This EUA will remain in effect (meaning this test can be used) for the duration of the COVID-19 declaration under Section 564(b)(1) of the Act, 21 U.S.C. section 360bbb-3(b)(1), unless the authorization is terminated or revoked sooner. Performed at [facility], [address], [place], [place] [address]       Radiology Studies: Ct Soft Tissue Neck W Contrast  Result Date: 03/11/2019  CLINICAL DATA:  Initial evaluation for neck swelling, recent surgery for odontogenic abscess. EXAM: CT NECK WITH CONTRAST TECHNIQUE: Multidetector CT imaging of the neck was performed using the standard protocol following the bolus administration of intravenous contrast. CONTRAST:  148mL OMNIPAQUE IOHEXOL 350 MG/ML SOLN COMPARISON:  Multiple previous CTs from 03/05/2019 and 03/04/2019. FINDINGS: Pharynx and larynx: Oral cavity within normal limits without mass lesion or collection. Since previous exam, there has been interval extraction of the right maxillary central and lateral incisors as well as the adjacent bicuspid. Residual inflammatory stranding overlies the adjacent maxilla with few small foci of gas, likely postsurgical in nature. No residual abscess now seen. Persistent hazy inflammatory stranding inferiorly along the anterior neck, right slightly worse than left, but definitely progressed as compared to previous exam. Associated thickening of the latissimus musculature bilaterally. No new collection within this region. No base of tongue lesion. No discrete tonsillar or peritonsillar abscess. Nasopharynx within normal limits. There is a persistent fairly sizable retropharyngeal effusion seen throughout the retropharyngeal space, measuring up to 2.5 cm in maximal AP diameter  (series 17, image 54). Lateral extension into the carotid spaces bilaterally, with hazy stranding extending towards the lateral aspects of the neck. Overall effusion is perhaps slightly increased in size from previous. No frank rim enhancement or loculation since previous exam. Supraglottic airway mildly narrowed but remains patent, relatively stable from previous. True cords within normal limits. Subglottic airway clear. Salivary glands: Salivary glands including the parotid and submandibular glands are within normal limits. Thyroid: Thyroid normal. Lymph nodes: Scattered bilateral level II/III lymph nodes again seen, measuring up to 14-15 mm bilaterally. Mildly prominent subcentimeter nodes seen within the right supraclavicular region as well. Few posterior chain nodes noted bilaterally, measuring up to 12 mm on the left (series 14, image 48). Overall, these are relatively similar to previous. Large nodal conglomerate within the right lower neck again seen, measuring 6.4 x 5.7 cm on today's exam. Right paratracheal node partially visualized, measuring 6.1 x 5.0 cm. Vascular: Normal intravascular enhancement seen throughout the neck. Limited intracranial: Unremarkable. Visualized orbits: Visualized globes and orbital soft tissues within normal limits. Mastoids and visualized paranasal sinuses: Small left maxillary and sphenoid sinus retention cyst noted. Mild scattered mucosal thickening noted within the ethmoidal air cells. Visualized paranasal sinuses are otherwise clear. Trace right mastoid effusion noted. Left mastoid air cells clear. Middle ear cavities well pneumatized and clear bilaterally. Skeleton: No acute osseous finding. No discrete lytic or blastic osseous lesions. Upper chest: Better evaluated on concomitant CT of the chest. Other: None. IMPRESSION: 1. Interval extraction of multiple right maxillary teeth. Residual inflammatory stranding with tiny foci of gas overlying the adjacent right maxilla  consistent with postsurgical changes and/or residual infection. No residual abscess or drainable fluid collection identified. Hazy inflammatory stranding within the anterior neck inferiorly compatible with associated cellulitis, progressed relative to 03/05/2019. 2. Persistent retropharyngeal effusion with mild mass effect, slightly increased in size from previous. No new rim enhancement or loculation since previous exam. Supraglottic airway is mildly narrowed but remains patent at this time. 3. Relatively similar cervical and mediastinal lymphadenopathy including large conglomerate nodal masses in the right lower neck and superior mediastinum, again concerning for metastatic disease or lymphoproliferative disorder. Electronically Signed   By: Jeannine Boga M.D.   On: 03/11/2019 18:52   Ct Angio Chest Pe W And/or Wo Contrast  Result Date: 03/11/2019 CLINICAL DATA:  Peritonsillar abscess, neck, right chest, and right breast swelling, neck pain EXAM: CT ANGIOGRAPHY CHEST  WITH CONTRAST TECHNIQUE: Multidetector CT imaging of the chest was performed using the standard protocol during bolus administration of intravenous contrast. Multiplanar CT image reconstructions and MIPs were obtained to evaluate the vascular anatomy. CONTRAST:  158mL OMNIPAQUE IOHEXOL 350 MG/ML SOLN COMPARISON:  CT neck, 03/05/2019 FINDINGS: Cardiovascular: Examination for pulmonary embolism is very limited by body habitus and poor contrast bolus, main pulmonary artery HU = 151. Within this limitation, there is no obvious central pulmonary embolism. Evaluation of the lobar and more distal pulmonary arteries is nondiagnostic. There is bulky lymphadenopathy which appears to obstruct the right brachiocephalic vein and superior vena cava. Normal heart size. No pericardial effusion. Mediastinum/Nodes: Redemonstrated, very bulky lower right cervical and right superior mediastinal lymphadenopathy, largest nodes measuring at least 5.8 cm (series  6, image 27). Thyroid gland, trachea, and esophagus demonstrate no significant findings. Lungs/Pleura: Probable right basilar atelectasis. No pleural effusion or pneumothorax. Upper Abdomen: No acute abnormality. Bilateral adrenal nodules measuring approximately 2.1 cm on the left and 1.3 cm on the right. Musculoskeletal: No chest wall abnormality. No acute or significant osseous findings. Review of the MIP images confirms the above findings. IMPRESSION: 1. Examination for pulmonary embolism is very limited by body habitus and poor contrast bolus, main pulmonary artery HU = 151. Within this limitation, there is no obvious central pulmonary embolism. Evaluation of the lobar and more distal pulmonary arteries is nondiagnostic. 2. Redemonstrated, very bulky lower right cervical and right superior mediastinal lymphadenopathy, largest nodes measuring at least 5.8 cm (series 6, image 27). This appears to obstruct the right brachiocephalic vein and superior vena cava. Findings are highly suspicious for malignancy, including lymphoma and metastatic disease. 3. Bilateral nonspecific soft tissue adrenal nodules, concerning for metastatic disease although incompletely characterized. 4. Probable right basilar atelectasis without definite acute airspace disease. Electronically Signed   By: Eddie Candle M.D.   On: 03/11/2019 18:34   Ct Abdomen Pelvis W Contrast  Result Date: 03/12/2019 CLINICAL DATA:  38 year old with bulky lymphadenopathy in the RIGHT LOWER neck and in the RIGHT superior mediastinum. Evaluate for abdominopelvic lymphadenopathy. EXAM: CT ABDOMEN AND PELVIS WITH CONTRAST TECHNIQUE: Multidetector CT imaging of the abdomen and pelvis was performed using the standard protocol following bolus administration of intravenous contrast. CONTRAST:  116mL OMNIPAQUE IOHEXOL 300 MG/ML IV. Oral contrast was also administered. COMPARISON:  No prior abdominopelvic CT. CTA chest 03/11/2019 is correlated. FINDINGS: Lower chest:  Dense consolidation dependently in the POSTERIOR RIGHT LOWER LOBE. Visualized lung bases otherwise clear. Normal heart size. Hepatobiliary: Borderline hepatomegaly. No focal hepatic parenchymal abnormality. Normal anatomic variant in that the LEFT lobe of the liver crosses the midline into the LEFT UPPER QUADRANT. Gallbladder normal in appearance without calcified gallstones. No biliary ductal dilation. Pancreas: Normal in appearance without evidence of mass, ductal dilation, or inflammation. Spleen: Normal in size and appearance. Adrenals/Urinary Tract: BILATERAL adrenal masses, that on the LEFT measuring approximately 1.6 x 2.5 x 2.2 cm and that on the RIGHT measuring approximately 1.3 x 2.1 x 2.7 cm. Kidneys normal in size and appearance without focal parenchymal abnormality. No hydronephrosis. No evidence of urinary tract calculi. Normal appearing urinary bladder. Stomach/Bowel: Stomach normal in appearance for the degree of distention. Normal-appearing small bowel. Moderate stool burden throughout the normal appearing colon. Normal appendix in the RIGHT UPPER pelvis filled with oral contrast. Vascular/Lymphatic: Minimal atherosclerosis involving the RIGHT common iliac artery. No evidence of abdominal aortic atherosclerosis or aneurysm. Normal-appearing portal venous and systemic venous systems. Enlarged hemiazygous vein in the retroperitoneum, likely  a collateral pathway due to the SVC compression noted on the prior CTA chest. No pathologic lymphadenopathy within the abdomen or pelvis. Reproductive: Normal-appearing uterus and ovaries without evidence of adnexal mass. Other: Edema involving the LATERAL subcutaneous tissues of the LOWER chest and upper abdomen. Musculoskeletal: Degenerative disc disease and spondylosis involving the visualized thoracic spine. Chronic L5-S1 disc protrusion with calcification in the POSTERIOR annular fibers. No acute findings. IMPRESSION: 1. No acute abnormalities involving the  abdomen or pelvis. 2. Borderline hepatomegaly without focal hepatic parenchymal abnormality. 3. Bilateral adrenal masses, measured above. Given the fact that the patient may have a malignancy, a dedicated CT of the abdomen without and with contrast (adrenal protocol) is recommended in further evaluation. This recommendation follows ACR consensus guidelines: Managing Incidental Findings on Abdominal CT: White Paper of the ACR Incidental Findings Committee. J Am Coll Radiol 2010;7:754-773. Electronically Signed   By: Evangeline Dakin M.D.   On: 03/12/2019 21:49     Scheduled Meds:  buPROPion  150 mg Oral Daily   enoxaparin (LOVENOX) injection  40 mg Subcutaneous Q24H   Continuous Infusions:  sodium chloride 125 mL/hr at 03/13/19 0552   clindamycin (CLEOCIN) IV 900 mg (03/13/19 0118)     LOS: 2 days   Time Spent in minutes   45 minutes  Deona Novitski D.O. on 03/13/2019 at 7:55 AM  Between 7am to 7pm - Please see pager noted on [email or web address]  After 7pm go to [email or web address]  And look for the night coverage person covering for me after hours  [facility] Office  [phone]

## 2019-03-14 ENCOUNTER — Inpatient Hospital Stay (HOSPITAL_COMMUNITY): Payer: BLUE CROSS/BLUE SHIELD

## 2019-03-14 DIAGNOSIS — R0609 Other forms of dyspnea: Secondary | ICD-10-CM

## 2019-03-14 DIAGNOSIS — J390 Retropharyngeal and parapharyngeal abscess: Secondary | ICD-10-CM

## 2019-03-14 LAB — BASIC METABOLIC PANEL
Anion gap: 9 (ref 5–15)
BUN: 18 mg/dL (ref 6–20)
CO2: 22 mmol/L (ref 22–32)
Calcium: 8.8 mg/dL — ABNORMAL LOW (ref 8.9–10.3)
Chloride: 104 mmol/L (ref 98–111)
Creatinine, Ser: 1.03 mg/dL — ABNORMAL HIGH (ref 0.44–1.00)
GFR calc Af Amer: >60 mL/min (ref >60)
GFR calc non Af Amer: >60 mL/min (ref >60)
Glucose, Bld: 96 mg/dL (ref 70–99)
Potassium: 4 mmol/L (ref 3.5–5.1)
Sodium: 135 mmol/L (ref 135–145)

## 2019-03-14 LAB — ECHOCARDIOGRAM LIMITED
Height: 66 in
Weight: 4656 oz

## 2019-03-14 LAB — GLUCOSE, CAPILLARY
Glucose-Capillary: 102 mg/dL — ABNORMAL HIGH (ref 70–99)
Glucose-Capillary: 121 mg/dL — ABNORMAL HIGH (ref 70–99)
Glucose-Capillary: 89 mg/dL (ref 70–99)
Glucose-Capillary: 91 mg/dL (ref 70–99)

## 2019-03-14 MED ORDER — LIDOCAINE HCL (PF) 1 % IJ SOLN
INTRAMUSCULAR | Status: AC
  Filled 2019-03-14: qty 30

## 2019-03-14 MED ORDER — SODIUM CHLORIDE 0.9 % IV SOLN: INTRAVENOUS | Status: AC | PRN

## 2019-03-14 MED ORDER — FENTANYL CITRATE (PF) 100 MCG/2ML IJ SOLN
INTRAMUSCULAR | Status: AC
  Filled 2019-03-14: qty 4

## 2019-03-14 MED ORDER — MIDAZOLAM HCL 2 MG/2ML IJ SOLN
INTRAMUSCULAR | Status: AC
  Filled 2019-03-14: qty 4

## 2019-03-14 MED ORDER — MIDAZOLAM HCL 2 MG/2ML IJ SOLN
INTRAMUSCULAR | Status: AC | PRN
  Administered 2019-03-14 (×2): 1 mg via INTRAVENOUS

## 2019-03-14 MED ORDER — FENTANYL CITRATE (PF) 100 MCG/2ML IJ SOLN
INTRAMUSCULAR | Status: AC | PRN
  Administered 2019-03-14 (×2): 50 ug via INTRAVENOUS

## 2019-03-14 MED ORDER — KETOROLAC TROMETHAMINE 30 MG/ML IJ SOLN
30.0000 mg | Freq: Four times a day (QID) | INTRAMUSCULAR | Status: AC | PRN
  Administered 2019-03-14 – 2019-03-19 (×8): 30 mg via INTRAVENOUS
  Filled 2019-03-14 (×9): qty 1

## 2019-03-14 MED ORDER — MORPHINE SULFATE (PF) 2 MG/ML IV SOLN
2.0000 mg | Freq: Once | INTRAVENOUS | Status: AC
  Administered 2019-03-14: 2 mg via INTRAVENOUS
  Filled 2019-03-14: qty 1

## 2019-03-14 NOTE — Progress Notes (Signed)
CCMD called to notify twice tonight that Pt had sinus paused, unsustained, HR 37-46 for a second. Mostly her HR stayed around 80s-90s., BP remained stable. She was sleeping comfortably, asymptomatically. Will continue to monitor.  Weslee Fogg,BSN,RN,PCCN-CMC,CSC

## 2019-03-14 NOTE — Procedures (Signed)
Interventional Radiology Procedure:   Indications: Neck lymphadenopathy  Procedure: US guided right supraclavicular lymph node biopsy  Findings: Large irregular nodal tissue in right supraclavicular region.  6 core samples obtained.  At least partially thrombosed right IJ.    Complications: None     EBL: less than 10 ml  Plan: Return to inpatient floor.     Krayton Wortley R. Anselm Pancoast, MD  Pager: [phone]

## 2019-03-14 NOTE — Anesthesia Preprocedure Evaluation (Addendum)
Anesthesia Evaluation  Patient identified by MRN, date of birth, ID band Patient awake    Reviewed: Allergy & Precautions, NPO status , Patient's Chart, lab work & pertinent test results  History of Anesthesia Complications Negative for: history of anesthetic complications  Airway Mallampati: II  TM Distance: <3 FB Neck ROM: Full   Comment: Large, thick neck Dental  (+) Teeth Intact   Pulmonary Current Smoker,    Pulmonary exam normal        Cardiovascular negative cardio ROS Normal cardiovascular exam     Neuro/Psych PSYCHIATRIC DISORDERS Depression negative neurological ROS     GI/Hepatic negative GI ROS, Neg liver ROS,   Endo/Other  Morbid obesity  Renal/GU negative Renal ROS  negative genitourinary   Musculoskeletal negative musculoskeletal ROS (+)   Abdominal (+) + obese,   Peds  Hematology negative hematology ROS (+)   Anesthesia Other Findings 38 yo F for I&D retropharyngeal abscess - depression, current smoker, pre-diabetes, BMI 46 - from CT neck on 5/15 "Supraglottic airway mildly narrowed but remains patent, relatively stable from previous. True cords within normal limits. Subglottic airway clear." - TTE 03/14/19 EF 60-65% otherwise unremarkable - COVID neg 5/15  Reproductive/Obstetrics                            Anesthesia Physical Anesthesia Plan  ASA: III  Anesthesia Plan: General   Post-op Pain Management:    Induction: Intravenous and Rapid sequence  PONV Risk Score and Plan: 3 and Ondansetron, Dexamethasone, Midazolam and Treatment may vary due to age or medical condition  Airway Management Planned: Oral ETT and Video Laryngoscope Planned  Additional Equipment: None  Intra-op Plan:   Post-operative Plan: Extubation in OR  Informed Consent: I have reviewed the patients History and Physical, chart, labs and discussed the procedure including the risks,  benefits and alternatives for the proposed anesthesia with the patient or authorized representative who has indicated his/her understanding and acceptance.     Dental advisory given  Plan Discussed with:   Anesthesia Plan Comments:        Anesthesia Quick Evaluation

## 2019-03-14 NOTE — Progress Notes (Signed)
PROGRESS NOTE    Tara Aguilar  BJS:[id number] DOB: 1981-07-16 DOA: 03/11/2019 PCP: [facility]   Brief Narrative:  HPI On 03/11/2019 by Dr. Tennis Must Tara Aguilar is a 38 y.o. female with medical history significant of depression, glucose intolerance, class III obesity who was recently admitted on 03/05/2019 and discharged 3 days later due to a peritonsillar abscess.  She was treated with clindamycin and analgesics.  She underwent teeth extraction on Tuesday and has remained taking her oral clindamycin.  However, the patient states that she has been progressively dyspneic in the past few days, particularly since yesterday.  She has also developed swelling of the right neck, right upper chest and right breast area.  She has been having pain in the area.  However she denies fever, chills, rhinorrhea but feels fatigue.  She occasionally some mild productive sputum.  She denies hemoptysis, wheezing or stridor she denies sick contacts or travel history..  However, she states that she has been having trouble lying down and gets orthopneic quickly.  She denies chest pain, palpitations, diaphoresis, pitting edema of the lower extremities, but has felt lightheaded and has had orthopnea.  Her appetite is decreased.   She denies abdominal pain, nausea, emesis, diarrhea, constipation, melena or hematochezia.  No dysuria, frequency or hematuria.  She denies polyuria, polydipsia, polyphagia or blurred vision.  She has been anxious and having some difficulty sleeping.  Interim history Admitted for dyspnea. Concern for lymphoma given CT results. IR consulted for biopsy. ENT consulted for retropharyngeal abscess. Assessment & Plan   Mediastinal lymphadenopathy with dyspnea -?  Concern for lymphoma -CTA chest unremarkable for PE.  Very bulky lower right cervical and right superior mediastinal lymphadenopathy largest measuring 5.8 cm.  Appears to obstruct of the right brachiocephalic vein and superior vena  cava.  Suspicious for malignancy including lymphoma or metastatic disease. -Interventional radiology consulted and appreciated for possible biopsy- likely to occur today 03/14/2019 -Discussed with oncology, will need to obtain biopsy -Patient was given 1 dose of dexamethasone 8 mg in the emergency department -Continue pain control -Complaining of dyspnea with minimal exertion.   -BNP 29 -CT abd/pelvis: no acute abnormalities in the abd or pelvis. B/L adrenal masses  Recent retropharyngeal/dental abscess -Status post dental extraction -Upon last admission, ENT was consulted, patient was started on clindamycin -Continue clindamycin and pain control -ENT, Dr. Benjamine Mola, consulted and appreciated  Depression -Continue Wellbutrin  Glucose intolerance/prediabetes -A1c 6.2 -Patient did receive Decadron while in the emergency department -Continue to monitor CBGs, if needed will start insulin sliding scale  Morbid obesity -BMI 86.97 -Patient to discuss lifestyle modifications with PCP  Tobacco abuse -Discussed cessation -Declined nicotine patch  Undiagnosed sleep apnea -tells me she snores and has never had a sleep study  Sinus pauses/Bradycardia -continues to have bradycardia and pauses overnight -will obtain echocardiogram -potassium and magnesium WNL  DVT Prophylaxis  lovenox  Code Status: Full  Family Communication: None at bedside  Disposition Plan: Admitted. Pending biopsy- likely on 5/18. Pending ENT consultation. Suspect home when stable  Consultants Interventional radiology Oncology, Dr. Alvy Bimler, via phone ENT, Dr. Benjamine Mola  Procedures  None  Antibiotics   Anti-infectives (From admission, onward)   Start     Dose/Rate Route Frequency Ordered Stop   03/12/19 0200  clindamycin (CLEOCIN) IVPB 900 mg     900 mg 100 mL/hr over 30 Minutes Intravenous Every 8 hours 03/12/19 0105        Subjective:   Tara Aguilar seen and examined today.  Continues to have shortness of  breath with movement.  Able to to the bathroom.  Denies current chest pain, abdominal pain, nausea or vomiting, diarrhea or constipation.  Does complain of irritation in her throat as well as swelling.  Feels her breast swelling is getting better.  Objective:   Vitals:   03/13/19 2028 03/13/19 2316 03/14/19 0421 03/14/19 0658  BP: (!) 124/92 117/63 118/72   Pulse: 93 87 84   Resp: 19 18 20  (!) 24  Temp: 98 F (36.7 C) (!) 97.5 F (36.4 C) 97.7 F (36.5 C)   TempSrc: Oral Oral Oral   SpO2: 98% 100% 100% 97%  Weight:      Height:        Intake/Output Summary (Last 24 hours) at 03/14/2019 0813 Last data filed at 03/14/2019 0600 Gross per 24 hour  Intake 3567.38 ml  Output 550 ml  Net 3017.38 ml   Filed Weights   03/11/19 1509 03/12/19 0030  Weight: 131.5 kg 132 kg   Exam  General: Well developed, well nourished, NAD, appears stated age  HEENT: NCAT, mucous membranes moist.   Neck: Supple. Fullness on the right, cervical and supraclavicular edema  Cardiovascular: S1 S2 auscultated, RRR, no murmur  Respiratory: Clear to auscultation bilaterally with equal chest rise  Abdomen: Soft, nontender, nondistended, + bowel sounds  Extremities: warm dry without cyanosis clubbing or edema  Neuro: AAOx3, nonfocal  Psych: Appropriate mood and affect  Data Reviewed: I have personally reviewed following labs and imaging studies  CBC: Recent Labs  Lab 03/11/19 1609  WBC 11.5*  NEUTROABS 8.8*  HGB 13.1  HCT 41.4  MCV 89.2  PLT 712   Basic Metabolic Panel: Recent Labs  Lab 03/11/19 1609 03/11/19 1805 03/13/19 0354 03/14/19 0446  NA 139  --  137 135  K 3.9  --  3.7 4.0  CL 107  --  105 104  CO2 24  --  23 22  GLUCOSE 88  --  103* 96  BUN 16  --  17 18  CREATININE 1.12*  --  1.01* 1.03*  CALCIUM 9.3  --  9.2 8.8*  MG  --  1.9 1.9  --   PHOS  --  3.7  --   --    GFR: Estimated Creatinine Clearance: 102.3 mL/min (A) (by C-G formula based on SCr of 1.03 mg/dL (H)).  Liver Function Tests: Recent Labs  Lab 03/11/19 1609  AST 25  ALT 45*  ALKPHOS 65  BILITOT 0.4  PROT 7.8  ALBUMIN 3.9   No results for input(s): LIPASE, AMYLASE in the last 168 hours. No results for input(s): AMMONIA in the last 168 hours. Coagulation Profile: Recent Labs  Lab 03/13/19 1420  INR 1.3*   Cardiac Enzymes: Recent Labs  Lab 03/11/19 1805  TROPONINI <0.03   BNP (last 3 results) No results for input(s): PROBNP in the last 8760 hours. HbA1C: Recent Labs    03/12/19 0241  HGBA1C 6.2*   CBG: Recent Labs  Lab 03/12/19 1613 03/13/19 0044 03/13/19 0756 03/13/19 1605 03/14/19 0032  GLUCAP 154* 162* 87 87 89   Lipid Profile: No results for input(s): CHOL, HDL, LDLCALC, TRIG, CHOLHDL, LDLDIRECT in the last 72 hours. Thyroid Function Tests: No results for input(s): TSH, T4TOTAL, FREET4, T3FREE, THYROIDAB in the last 72 hours. Anemia Panel: No results for input(s): VITAMINB12, FOLATE, FERRITIN, TIBC, IRON, RETICCTPCT in the last 72 hours. Urine analysis: No results found for: COLORURINE, APPEARANCEUR, [place], [place], [place], [place], [place],  [place] Stain, PROTEINUR, UROBILINOGEN, NITRITE, LEUKOCYTESUR Sepsis Labs: @LABRCNTIP (procalcitonin:4,lacticidven:4)  ) Recent Results (from the past 240 hour(s))  SARS Coronavirus 2 (CEPHEID - Performed in [place] hospital lab), Hosp Order     Status: None   Collection Time: 03/05/19  5:10 PM  Result Value Ref Range Status   SARS Coronavirus 2 NEGATIVE NEGATIVE Final    Comment: (NOTE) If result is NEGATIVE SARS-CoV-2 target nucleic acids are NOT DETECTED. The SARS-CoV-2 RNA is generally detectable in upper and lower  respiratory specimens during the acute phase of infection. The lowest  concentration of SARS-CoV-2 viral copies this assay can detect is 250  copies / mL. A negative result does not preclude SARS-CoV-2 infection  and should not be used as the sole basis for treatment or other  patient  management decisions.  A negative result may occur with  improper specimen collection / handling, submission of specimen other  than nasopharyngeal swab, presence of viral mutation(s) within the  areas targeted by this assay, and inadequate number of viral copies  (<250 copies / mL). A negative result must be combined with clinical  observations, patient history, and epidemiological information. If result is POSITIVE SARS-CoV-2 target nucleic acids are DETECTED. The SARS-CoV-2 RNA is generally detectable in upper and lower  respiratory specimens dur ing the acute phase of infection.  Positive  results are indicative of active infection with SARS-CoV-2.  Clinical  correlation with patient history and other diagnostic information is  necessary to determine patient infection status.  Positive results do  not rule out bacterial infection or co-infection with other viruses. If result is PRESUMPTIVE POSTIVE SARS-CoV-2 nucleic acids MAY BE PRESENT.   A presumptive positive result was obtained on the submitted specimen  and confirmed on repeat testing.  While 2019 novel coronavirus  (SARS-CoV-2) nucleic acids may be present in the submitted sample  additional confirmatory testing may be necessary for epidemiological  and / or clinical management purposes  to differentiate between  SARS-CoV-2 and other Sarbecovirus currently known to infect humans.  If clinically indicated additional testing with an alternate test  methodology [phone]) is advised. The SARS-CoV-2 RNA is generally  detectable in upper and lower respiratory sp ecimens during the acute  phase of infection. The expected result is Negative. Fact Sheet for Patients:  StrictlyIdeas.no Fact Sheet for Healthcare Providers: BankingDealers.co.za This test is not yet approved or cleared by the [place] FDA and has been authorized for detection and/or diagnosis of SARS-CoV-2 by FDA under  an Emergency Use Authorization (EUA).  This EUA will remain in effect (meaning this test can be used) for the duration of the COVID-19 declaration under Section 564(b)(1) of the Act, 21 U.S.C. section 360bbb-3(b)(1), unless the authorization is terminated or revoked sooner. Performed at [facility], [address] [place], [place] [address]   MRSA PCR Screening     Status: None   Collection Time: 03/05/19  5:15 PM  Result Value Ref Range Status   MRSA by PCR NEGATIVE NEGATIVE Final    Comment:        The GeneXpert MRSA Assay (FDA approved for NASAL specimens only), is one component of a comprehensive MRSA colonization surveillance program. It is not intended to diagnose MRSA infection nor to guide or monitor treatment for MRSA infections. Performed at [facility], [address] [place], [place] [address]   Culture, blood (routine x 2)     Status: None   Collection Time: 03/05/19  6:38 PM  Result Value Ref Range  Status   Specimen Description RIGHT ANTECUBITAL  Final   Special Requests   Final    BOTTLES DRAWN AEROBIC AND ANAEROBIC Blood Culture adequate volume   Culture   Final    NO GROWTH 5 DAYS Performed at [facility], [address] [place], [place] [address]    Report Status 03/10/2019 FINAL  Final  Culture, blood (routine x 2)     Status: None   Collection Time: 03/05/19  6:42 PM  Result Value Ref Range Status   Specimen Description LEFT ANTECUBITAL  Final   Special Requests   Final    BOTTLES DRAWN AEROBIC AND ANAEROBIC Blood Culture adequate volume   Culture   Final    NO GROWTH 5 DAYS Performed at [facility], [address] [place], [place] [address]    Report Status 03/10/2019 FINAL  Final  SARS Coronavirus 2 (CEPHEID - Performed in [place] hospital lab), Hosp Order     Status: None   Collection Time: 03/11/19  8:55 PM  Result Value Ref Range Status   SARS Coronavirus 2 NEGATIVE NEGATIVE Final    Comment: (NOTE) If result is NEGATIVE SARS-CoV-2  target nucleic acids are NOT DETECTED. The SARS-CoV-2 RNA is generally detectable in upper and lower  respiratory specimens during the acute phase of infection. The lowest  concentration of SARS-CoV-2 viral copies this assay can detect is 250  copies / mL. A negative result does not preclude SARS-CoV-2 infection  and should not be used as the sole basis for treatment or other  patient management decisions.  A negative result may occur with  improper specimen collection / handling, submission of specimen other  than nasopharyngeal swab, presence of viral mutation(s) within the  areas targeted by this assay, and inadequate number of viral copies  (<250 copies / mL). A negative result must be combined with clinical  observations, patient history, and epidemiological information. If result is POSITIVE SARS-CoV-2 target nucleic acids are DETECTED. The SARS-CoV-2 RNA is generally detectable in upper and lower  respiratory specimens dur ing the acute phase of infection.  Positive  results are indicative of active infection with SARS-CoV-2.  Clinical  correlation with patient history and other diagnostic information is  necessary to determine patient infection status.  Positive results do  not rule out bacterial infection or co-infection with other viruses. If result is PRESUMPTIVE POSTIVE SARS-CoV-2 nucleic acids MAY BE PRESENT.   A presumptive positive result was obtained on the submitted specimen  and confirmed on repeat testing.  While 2019 novel coronavirus  (SARS-CoV-2) nucleic acids may be present in the submitted sample  additional confirmatory testing may be necessary for epidemiological  and / or clinical management purposes  to differentiate between  SARS-CoV-2 and other Sarbecovirus currently known to infect humans.  If clinically indicated additional testing with an alternate test  methodology [phone]) is advised. The SARS-CoV-2 RNA is generally  detectable in upper and lower  respiratory sp ecimens during the acute  phase of infection. The expected result is Negative. Fact Sheet for Patients:  StrictlyIdeas.no Fact Sheet for Healthcare Providers: BankingDealers.co.za This test is not yet approved or cleared by the [place] FDA and has been authorized for detection and/or diagnosis of SARS-CoV-2 by FDA under an Emergency Use Authorization (EUA).  This EUA will remain in effect (meaning this test can be used) for the duration of the COVID-19 declaration under Section 564(b)(1) of the Act, 21 U.S.C. section 360bbb-3(b)(1), unless the authorization is terminated or revoked  sooner. Performed at [facility], [address], [place], [place] [address]       Radiology Studies: Ct Abdomen Pelvis W Contrast  Result Date: 03/12/2019 CLINICAL DATA:  38 year old with bulky lymphadenopathy in the RIGHT LOWER neck and in the RIGHT superior mediastinum. Evaluate for abdominopelvic lymphadenopathy. EXAM: CT ABDOMEN AND PELVIS WITH CONTRAST TECHNIQUE: Multidetector CT imaging of the abdomen and pelvis was performed using the standard protocol following bolus administration of intravenous contrast. CONTRAST:  162mL OMNIPAQUE IOHEXOL 300 MG/ML IV. Oral contrast was also administered. COMPARISON:  No prior abdominopelvic CT. CTA chest 03/11/2019 is correlated. FINDINGS: Lower chest: Dense consolidation dependently in the POSTERIOR RIGHT LOWER LOBE. Visualized lung bases otherwise clear. Normal heart size. Hepatobiliary: Borderline hepatomegaly. No focal hepatic parenchymal abnormality. Normal anatomic variant in that the LEFT lobe of the liver crosses the midline into the LEFT UPPER QUADRANT. Gallbladder normal in appearance without calcified gallstones. No biliary ductal dilation. Pancreas: Normal in appearance without evidence of mass, ductal dilation, or inflammation. Spleen: Normal in size and appearance. Adrenals/Urinary Tract:  BILATERAL adrenal masses, that on the LEFT measuring approximately 1.6 x 2.5 x 2.2 cm and that on the RIGHT measuring approximately 1.3 x 2.1 x 2.7 cm. Kidneys normal in size and appearance without focal parenchymal abnormality. No hydronephrosis. No evidence of urinary tract calculi. Normal appearing urinary bladder. Stomach/Bowel: Stomach normal in appearance for the degree of distention. Normal-appearing small bowel. Moderate stool burden throughout the normal appearing colon. Normal appendix in the RIGHT UPPER pelvis filled with oral contrast. Vascular/Lymphatic: Minimal atherosclerosis involving the RIGHT common iliac artery. No evidence of abdominal aortic atherosclerosis or aneurysm. Normal-appearing portal venous and systemic venous systems. 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 No pathologic lymphadenopathy within the abdomen or pelvis. Reproductive: Normal-appearing uterus and ovaries without evidence of adnexal mass. Other: Edema involving the LATERAL subcutaneous tissues of the LOWER chest and upper abdomen. Musculoskeletal: Degenerative disc disease and spondylosis involving the visualized thoracic spine. Chronic L5-S1 disc protrusion with calcification in the POSTERIOR annular fibers. No acute findings. IMPRESSION: 1. No acute abnormalities involving the abdomen or pelvis. 2. Borderline hepatomegaly without focal hepatic parenchymal abnormality. 3. Bilateral adrenal masses, measured above. Given the fact that the patient may have a malignancy, a dedicated CT of the abdomen without and with contrast (adrenal protocol) is recommended in further evaluation. This recommendation follows ACR consensus guidelines: Managing Incidental Findings on Abdominal CT: White Paper of the ACR Incidental Findings Committee. J Am Coll Radiol 2010;7:754-773. Electronically Signed   By: Evangeline Dakin M.D.   On: 03/12/2019  21:49     Scheduled Meds: . buPROPion  150 mg Oral Daily  . enoxaparin (LOVENOX) injection  40 mg Subcutaneous Q24H   Continuous Infusions: . sodium chloride 125 mL/hr at 03/14/19 0156  . clindamycin (CLEOCIN) IV 900 mg (03/14/19 0158)     LOS: 3 days   Time Spent in minutes   30 minutes  Elishah Ashmore D.O. on 03/14/2019 at 8:13 AM  Between 7am to 7pm - Please see pager noted on [email or web address]  After 7pm go to [email or web address]  And look for the night coverage person covering for me after hours  [facility] Office  [phone]

## 2019-03-14 NOTE — Progress Notes (Signed)
2D Echocardiogram has been performed.  Tara Aguilar 03/14/2019, 10:02 AM

## 2019-03-14 NOTE — Progress Notes (Addendum)
Subjective: Increased dysphagia and dyspnea over the past few days. No odynophagia. Recent dental extraction by Dr. Benson Norway to treat her dental abscess. Neck and chest CT showed persistent retropharyngeal effusion and cervical/mediastinal lymphadenopathy.  Objective: Vital signs in last 24 hours: Temp:  [97.5 F (36.4 C)-98.5 F (36.9 C)] 97.7 F (36.5 C) (05/18 0421) Pulse Rate:  [84-97] 97 (05/18 0901) Resp:  [17-27] 27 (05/18 0901) BP: (117-124)/(63-92) 119/69 (05/18 0901) SpO2:  [97 %-100 %] 97 % (05/18 0901)  Physical Exam: General:Awake and alert and oriented x3. No acute distress. Eyes:Pupils are equal, round, reactive to light. Extraocular motion is intact.  Ears: Examination of the ears shows normal auricles and external auditory canals bilaterally. Both tympanic membranes are intact. Face:Facial edema and erythema have improved. Nose: Nasal examination shows normal mucosa, septum, turbinates.  Mouth: No significant purulent drainage is noted today. Neck:Obese. Palpation of the neck reveals bilateral lymphadenopathy.The trachea is midline. The thyroid is not significantly enlarged.  Neuro: Cranial nerves 2-12 are all grossly in tact. Chest:Good respiratory effort. No accessory muscle use.No stridor. Psychiatric:Intact judgment and insight. Pleasant and cooperative. Neurologic:Cranial nerves II through XII are grossly intact.   Recent Labs    03/11/19 1609  WBC 11.5*  HGB 13.1  HCT 41.4  PLT 250   Recent Labs    03/13/19 0354 03/14/19 0446  NA 137 135  K 3.7 4.0  CL 105 104  CO2 23 22  GLUCOSE 103* 96  BUN 17 18  CREATININE 1.01* 1.03*  CALCIUM 9.2 8.8*    Medications:  I have reviewed the patient's current medications. Scheduled: . buPROPion  150 mg Oral Daily  . enoxaparin (LOVENOX) injection  40 mg Subcutaneous Q24H   Continuous: . sodium chloride 125 mL/hr at 03/14/19 1109  . clindamycin (CLEOCIN) IV 900 mg (03/14/19 1287)     Assessment/Plan: Dental abscess improved s/p dental extractions.  She continues to have retropharyngeal effusion, now causing increasing dysphagia.  Also has significant cervical and mediastinal LAD. - Plan I&D of retropharyngeal effusion in OR tomorrow (scheduled for 7:30am) - Core biopsy of her LAD by IR today. - Continue IV clindamycin.   LOS: 3 days   Shalese Strahan W Jefferey Lippmann 03/14/2019, 11:32 AM

## 2019-03-15 ENCOUNTER — Inpatient Hospital Stay (HOSPITAL_COMMUNITY): Payer: BLUE CROSS/BLUE SHIELD | Admitting: Anesthesiology

## 2019-03-15 ENCOUNTER — Encounter (HOSPITAL_COMMUNITY)
Admission: EM | Disposition: A | Discharge status: Home / Self Care | Payer: Self-pay | Source: Home / Self Care | Attending: Internal Medicine

## 2019-03-15 DIAGNOSIS — T884XXA Failed or difficult intubation, initial encounter: Secondary | ICD-10-CM

## 2019-03-15 DIAGNOSIS — J390 Retropharyngeal and parapharyngeal abscess: Secondary | ICD-10-CM

## 2019-03-15 HISTORY — PX: TONSILLECTOMY AND ADENOIDECTOMY: SHX28

## 2019-03-15 HISTORY — PX: IRRIGATION AND DEBRIDEMENT ABSCESS: SHX5252

## 2019-03-15 LAB — GLUCOSE, CAPILLARY
Glucose-Capillary: 114 mg/dL — ABNORMAL HIGH (ref 70–99)
Glucose-Capillary: 159 mg/dL — ABNORMAL HIGH (ref 70–99)

## 2019-03-15 SURGERY — TONSILLECTOMY AND ADENOIDECTOMY
Anesthesia: General | Site: Throat

## 2019-03-15 MED ORDER — ONDANSETRON HCL 4 MG/2ML IJ SOLN: 4.0000 mg | Freq: Once | INTRAMUSCULAR | Status: DC | PRN

## 2019-03-15 MED ORDER — FENTANYL CITRATE (PF) 250 MCG/5ML IJ SOLN
INTRAMUSCULAR | Status: AC
  Filled 2019-03-15: qty 5

## 2019-03-15 MED ORDER — PROPOFOL 10 MG/ML IV BOLUS
INTRAVENOUS | Status: AC
  Filled 2019-03-15: qty 20

## 2019-03-15 MED ORDER — OXYCODONE HCL 5 MG/5ML PO SOLN: 5.0000 mg | Freq: Once | ORAL | Status: DC | PRN

## 2019-03-15 MED ORDER — PROPOFOL 10 MG/ML IV BOLUS
INTRAVENOUS | Status: DC | PRN
  Administered 2019-03-15 (×3): 200 mg via INTRAVENOUS

## 2019-03-15 MED ORDER — DEXAMETHASONE SODIUM PHOSPHATE 10 MG/ML IJ SOLN
INTRAMUSCULAR | Status: DC | PRN
  Administered 2019-03-15: 12 mg via INTRAVENOUS

## 2019-03-15 MED ORDER — LIDOCAINE 2% (20 MG/ML) 5 ML SYRINGE
INTRAMUSCULAR | Status: DC | PRN
  Administered 2019-03-15: 100 mg via INTRAVENOUS

## 2019-03-15 MED ORDER — FENTANYL CITRATE (PF) 100 MCG/2ML IJ SOLN: 25.0000 ug | INTRAMUSCULAR | Status: DC | PRN

## 2019-03-15 MED ORDER — 0.9 % SODIUM CHLORIDE (POUR BTL) OPTIME
TOPICAL | Status: DC | PRN
  Administered 2019-03-15: 1000 mL

## 2019-03-15 MED ORDER — LACTATED RINGERS IV SOLN
INTRAVENOUS | Status: DC | PRN
  Administered 2019-03-15: 08:00:00 via INTRAVENOUS

## 2019-03-15 MED ORDER — SUCCINYLCHOLINE CHLORIDE 20 MG/ML IJ SOLN
INTRAMUSCULAR | Status: DC | PRN
  Administered 2019-03-15: 140 mg via INTRAVENOUS

## 2019-03-15 MED ORDER — MIDAZOLAM HCL 2 MG/2ML IJ SOLN
INTRAMUSCULAR | Status: AC
  Filled 2019-03-15: qty 2

## 2019-03-15 MED ORDER — PHENYLEPHRINE 40 MCG/ML (10ML) SYRINGE FOR IV PUSH (FOR BLOOD PRESSURE SUPPORT)
PREFILLED_SYRINGE | INTRAVENOUS | Status: DC | PRN
  Administered 2019-03-15: 80 ug via INTRAVENOUS

## 2019-03-15 MED ORDER — MIDAZOLAM HCL 5 MG/5ML IJ SOLN
INTRAMUSCULAR | Status: DC | PRN
  Administered 2019-03-15: 2 mg via INTRAVENOUS

## 2019-03-15 MED ORDER — OXYMETAZOLINE HCL 0.05 % NA SOLN
NASAL | Status: AC
  Filled 2019-03-15: qty 30

## 2019-03-15 MED ORDER — FENTANYL CITRATE (PF) 250 MCG/5ML IJ SOLN
INTRAMUSCULAR | Status: DC | PRN
  Administered 2019-03-15: 100 ug via INTRAVENOUS

## 2019-03-15 MED ORDER — OXYCODONE HCL 5 MG PO TABS: 5.0000 mg | ORAL_TABLET | Freq: Once | ORAL | Status: DC | PRN

## 2019-03-15 SURGICAL SUPPLY — 30 items
BLADE SURG 15 STRL LF DISP TIS (BLADE) IMPLANT
BLADE SURG 15 STRL SS (BLADE) ×1
CANISTER SUCT 3000ML PPV (MISCELLANEOUS) ×2 IMPLANT
CATH ROBINSON RED A/P 10FR (CATHETERS) IMPLANT
COAGULATOR SUCT SWTCH 10FR 6 (ELECTROSURGICAL) IMPLANT
CONT SPEC 4OZ CLIKSEAL STRL BL (MISCELLANEOUS) ×1 IMPLANT
COVER WAND RF STERILE (DRAPES) ×2 IMPLANT
ELECT REM PT RETURN 9FT ADLT (ELECTROSURGICAL)
ELECT REM PT RETURN 9FT PED (ELECTROSURGICAL)
ELECTRODE REM PT RETRN 9FT PED (ELECTROSURGICAL) IMPLANT
ELECTRODE REM PT RTRN 9FT ADLT (ELECTROSURGICAL) IMPLANT
GAUZE 4X4 16PLY RFD (DISPOSABLE) ×2 IMPLANT
GLOVE ECLIPSE 7.5 STRL STRAW (GLOVE) ×2 IMPLANT
GOWN STRL REUS W/ TWL LRG LVL3 (GOWN DISPOSABLE) ×2 IMPLANT
GOWN STRL REUS W/TWL LRG LVL3 (GOWN DISPOSABLE) ×2
KIT BASIN OR (CUSTOM PROCEDURE TRAY) ×2 IMPLANT
KIT TURNOVER KIT B (KITS) ×2 IMPLANT
NDL SPNL 18GX3.5 QUINCKE PK (NEEDLE) IMPLANT
NEEDLE SPNL 18GX3.5 QUINCKE PK (NEEDLE) ×2 IMPLANT
NS IRRIG 1000ML POUR BTL (IV SOLUTION) ×2 IMPLANT
PACK SURGICAL SETUP 50X90 (CUSTOM PROCEDURE TRAY) ×2 IMPLANT
SPONGE TONSIL TAPE 1 RFD (DISPOSABLE) ×2 IMPLANT
SWAB CULTURE LIQ STUART DBL (MISCELLANEOUS) ×1 IMPLANT
SYR BULB 3OZ (MISCELLANEOUS) ×2 IMPLANT
SYR CONTROL 10ML LL (SYRINGE) ×1 IMPLANT
TOWEL OR 17X24 6PK STRL BLUE (TOWEL DISPOSABLE) ×4 IMPLANT
TUBE ANAEROBIC SPECIMEN COL (MISCELLANEOUS) ×1 IMPLANT
TUBE CONNECTING 12X1/4 (SUCTIONS) ×2 IMPLANT
TUBE SALEM SUMP 16 FR W/ARV (TUBING) ×1 IMPLANT
WAND COBLATOR 70 EVAC XTRA (SURGICAL WAND) ×2 IMPLANT

## 2019-03-15 NOTE — Op Note (Signed)
DATE OF PROCEDURE:  03/15/2019                              OPERATIVE REPORT  SURGEON:  Leta Baptist, MD  PREOPERATIVE DIAGNOSES: 1. Retropharyngeal effusion/abscess 2. Dysphagia  POSTOPERATIVE DIAGNOSES: 1. Retropharyngeal effusion/phlegmon 2. Dysphagia  PROCEDURE PERFORMED:  Incision and drainage of retropharyngeal effusion/abscess  ANESTHESIA:  General endotracheal tube anesthesia.  COMPLICATIONS:  None.  ESTIMATED BLOOD LOSS:  Minimal.  INDICATION FOR PROCEDURE:  Tara Aguilar is a 38 y.o. female who was admitted to [facility] for increasing dysphagia and dyspnea. She was recently treated for dental abscess, retropharyngeal effusion, and lymphadenopathy.  On her recent CT scan, the retropharyngeal effusion had increased. The patient was complaining of increasing dysphagia. Based on the above findings, the decision was made for the patient to undergo the I&D procedure. Likelihood of success in reducing symptoms was also discussed.  The risks, benefits, alternatives, and details of the procedure were discussed with the patient.  Questions were invited and answered.  Informed consent was obtained.  DESCRIPTION:  The patient was taken to the operating room and placed supine on the operating table.  General endotracheal tube anesthesia was administered by the anesthesiologist.  The patient was positioned and prepped and draped in a standard fashion for pharyngeal surgery.  A Crowe-Davis mouth gag was inserted into the oral cavity for exposure.  A spinal needle was used to probe the retropharyngeal space. A small amount of serosanguinous fluid was suction. Aerobic and anaerobic cultures were obtained. An incision was made on the posterior pharyngeal mucosa. Only phlegmonous changes was noted.  No significant abscess was noted.  The mouth gag was removed.  The care of the patient was turned over to the anesthesiologist.  The patient was awakened from anesthesia without difficulty.  The  patient was extubated and transferred to the recovery room in good condition.  OPERATIVE FINDINGS:  Retropharyngeal effusion/phlegmon. No abscess noted.  SPECIMEN:  None. Aerobic and anaerobic cultures were obtained.  FOLLOWUP CARE:  The patient will be returned to her hospital bed.  Zoriah Pulice W Juno Alers 03/15/2019 8:24 AM

## 2019-03-15 NOTE — Progress Notes (Signed)
Patient arrived from pacu, placed on monitor and vital signs obtained. Patient sleepy but arousable. Will monitor patient. Rosco Harriott, Bettina Gavia RN

## 2019-03-15 NOTE — Anesthesia Procedure Notes (Signed)
Procedure Name: Intubation Date/Time: 03/15/2019 7:58 AM Performed by: Mariea Clonts, CRNA Pre-anesthesia Checklist: Patient identified, Emergency Drugs available, Suction available and Patient being monitored Patient Re-evaluated:Patient Re-evaluated prior to induction Oxygen Delivery Method: Circle System Utilized Preoxygenation: Pre-oxygenation with 100% oxygen Induction Type: IV induction Ventilation: Mask ventilation with difficulty, Two handed mask ventilation required and Oral airway inserted - appropriate to patient size Laryngoscope Size: Glidescope and 4 Grade View: Grade III Tube type: Oral Tube size: 7.0 mm Number of attempts: 2 Airway Equipment and Method: Stylet,  Oral airway,  Video-laryngoscopy and Fiberoptic brochoscope Placement Confirmation: ETT inserted through vocal cords under direct vision,  positive ETCO2 and breath sounds checked- equal and bilateral Tube secured with: Tape Dental Injury: Teeth and Oropharynx as per pre-operative assessment  Difficulty Due To: Difficulty was anticipated, Difficult Airway- due to anterior larynx, Difficult Airway- due to reduced neck mobility and Difficult Airway- due to large tongue Future Recommendations: Recommend- awake intubation

## 2019-03-15 NOTE — Discharge Instructions (Signed)
°  Mar 15, 2019  Tara Aguilar [address] [place] [address]   Dear Ms. Tep,   I hope you are doing well after your surgery.  Included with this note is the letter you should have received after your surgery. I think it is always a good idea to let the anesthesia team know about previous difficult intubations.  Thank you for communicating it with [place].  As we discussed in the holding area prior to you surgery, I was able to review the previous records and did not need to read the letter.  We modified our intubation strategy with the information from the previous surgery and were able to intubate you without difficulty.  I mentioned this to you in the recovery room, but you may not remember our discussion. We used a "Glidescope" for the procedure and our technique is documented in our anesthetic record.  This record is available should others need to review it. If you have any questions about the procedure, please let me know and I will be happy to help.    During your recent procedure at [facility], your anesthesia team determined that you were a difficult intubation.  This means that there was difficulty in placing a breathing tube from your mouth into your windpipe.  This procedure is commonly performed for general anesthetics for the purpose of providing oxygen and anesthetic gases during the operation.  Routinely, this is done using an instrument, known as a laryngoscope, to visualize the vocal cords and directly place the breathing tube into the trachea.  It is very important for you to tell your Anesthesiologist when you have future operations, that you are a difficult intubation so that other arrangements, personnel, or instruments can be obtained to ensure your safety.   We recommend that you obtain and wear a Medical Alert Bracelet in case an emergency arises.  There are many companies who will sell you such a bracelet in many styles.  They are easily found on the internet by searching "Medical  Alert Bracelet."  Yours should have the words "Difficult Intubation" written on the bracelet.   It was a pleasure to take care of you for your surgery, and I hope the best for you in the future. Should you have any questions, please feel free to contact me at [phone].   Lidia Collum

## 2019-03-15 NOTE — Transfer of Care (Signed)
Immediate Anesthesia Transfer of Care Note  Patient: Tara Aguilar  Procedure(s) Performed: IRRIGATION AND DEBRIDEMENT OF RETROPHARYNGEAL ABSCESS (N/A Throat)  Patient Location: PACU  Anesthesia Type:General  Level of Consciousness: awake, alert  and oriented  Airway & Oxygen Therapy: Patient Spontanous Breathing and Patient connected to face mask oxygen  Post-op Assessment: Report given to RN, Post -op Vital signs reviewed and stable and Patient moving all extremities X 4  Post vital signs: Reviewed and stable  Last Vitals:  Vitals Value Taken Time  BP 112/68 03/15/2019  8:36 AM  Temp    Pulse 106 03/15/2019  8:37 AM  Resp 18 03/15/2019  8:37 AM  SpO2 95 % 03/15/2019  8:37 AM  Vitals shown include unvalidated device data.  Last Pain:  Vitals:   03/15/19 0500  TempSrc: Oral  PainSc:       Patients Stated Pain Goal: 0 (14/27/67 0110)  Complications: No apparent anesthesia complications

## 2019-03-15 NOTE — Anesthesia Procedure Notes (Signed)
Procedure Name: LMA Insertion Date/Time: 03/15/2019 7:44 AM Performed by: Mariea Clonts, CRNA Pre-anesthesia Checklist: Patient identified, Emergency Drugs available, Suction available and Patient being monitored Patient Re-evaluated:Patient Re-evaluated prior to induction Oxygen Delivery Method: Circle System Utilized Preoxygenation: Pre-oxygenation with 100% oxygen Induction Type: IV induction LMA: LMA inserted LMA Size: 4.0 Number of attempts: 1 Airway Equipment and Method: Bite block Placement Confirmation: positive ETCO2 Tube secured with: Tape Dental Injury: Teeth and Oropharynx as per pre-operative assessment  Difficulty Due To: Difficulty was anticipated

## 2019-03-15 NOTE — Progress Notes (Addendum)
PROGRESS NOTE    Tara Aguilar  JKD:[id number] DOB: 1981-07-29 DOA: 03/11/2019 PCP: [facility]   Brief Narrative:  HPI On 03/11/2019 by Dr. Tennis Must Tara Aguilar is a 38 y.o. female with medical history significant of depression, glucose intolerance, class III obesity who was recently admitted on 03/05/2019 and discharged 3 days later due to a peritonsillar abscess.  She was treated with clindamycin and analgesics.  She underwent teeth extraction on Tuesday and has remained taking her oral clindamycin.  However, the patient states that she has been progressively dyspneic in the past few days, particularly since yesterday.  She has also developed swelling of the right neck, right upper chest and right breast area.  She has been having pain in the area.  However she denies fever, chills, rhinorrhea but feels fatigue.  She occasionally some mild productive sputum.  She denies hemoptysis, wheezing or stridor she denies sick contacts or travel history..  However, she states that she has been having trouble lying down and gets orthopneic quickly.  She denies chest pain, palpitations, diaphoresis, pitting edema of the lower extremities, but has felt lightheaded and has had orthopnea.  Her appetite is decreased.   She denies abdominal pain, nausea, emesis, diarrhea, constipation, melena or hematochezia.  No dysuria, frequency or hematuria.  She denies polyuria, polydipsia, polyphagia or blurred vision.  She has been anxious and having some difficulty sleeping.  Interim history Admitted for dyspnea. Concern for lymphoma given CT results. IR consulted for biopsy. ENT consulted for retropharyngeal abscess. Assessment & Plan   Mediastinal lymphadenopathy with dyspnea -?  Concern for lymphoma -CTA chest unremarkable for PE.  Very bulky lower right cervical and right superior mediastinal lymphadenopathy largest measuring 5.8 cm.  Appears to obstruct of the right brachiocephalic vein and superior vena  cava.  Suspicious for malignancy including lymphoma or metastatic disease. -Interventional radiology consulted and appreciated, s/p ultrasound-guided right supraclavicular lymph node biopsy -Discussed with oncology, will need to obtain biopsy -Patient was given 1 dose of dexamethasone 8 mg in the emergency department -Continue pain control -Complaining of dyspnea with minimal exertion.   -BNP 29 -Echocardiogram: EF 60-65% -CT abd/pelvis: no acute abnormalities in the abd or pelvis. B/L adrenal masses  Recent retropharyngeal/dental abscess -Status post dental extraction -Upon last admission, ENT was consulted, patient was started on clindamycin -Continue clindamycin and pain control -ENT, Dr. Benjamine Mola, consulted and appreciated -S/p incision and drainage of retropharyngeal effusion/abscess -Small amount of serosanguineous fluid was obtained and sent for aerobic and anaerobic cultures  Depression -Continue Wellbutrin  Glucose intolerance/prediabetes -A1c 6.2 -Patient did receive Decadron while in the emergency department -Continue to monitor CBGs, if needed will start insulin sliding scale  Morbid obesity -BMI 86.97 -Patient to discuss lifestyle modifications with PCP  Tobacco abuse -Discussed cessation -Declined nicotine patch  Undiagnosed sleep apnea -tells me she snores and has never had a sleep study  Sinus pauses/Bradycardia -continues to have bradycardia and pauses overnight -will obtain echocardiogram -potassium and magnesium WNL  DVT Prophylaxis  lovenox  Code Status: Full  Family Communication: None at bedside  Disposition Plan: Admitted. Pending biopsy results and further recommendations from ENT.  Suspect home when stable.  Consultants Interventional radiology Oncology, Dr. Alvy Bimler, via phone ENT, Dr. Benjamine Mola  Procedures  Ultrasound-guided right supraclavicular lymph node biopsy Incision and drainage of retropharyngeal effusion/abscess Echocardiogram   Antibiotics   Anti-infectives (From admission, onward)   Start     Dose/Rate Route Frequency Ordered Stop   03/12/19 0200  clindamycin (  CLEOCIN) IVPB 900 mg     900 mg 100 mL/hr over 30 Minutes Intravenous Every 8 hours 03/12/19 0105        Subjective:   Tara Aguilar seen and examined today.  Just coming back from surgery and feeling somewhat sleepy.  Denies chest pain, abdominal pain, nausea or vomiting, diarrhea or constipation, dizziness or headache.  Objective:   Vitals:   03/15/19 0845 03/15/19 0850 03/15/19 0911 03/15/19 0915  BP:  95/76 (!) 131/93 (!) 103/47  Pulse: (!) 103 (!) 106 98 100  Resp: 13 15  13   Temp:      TempSrc:      SpO2: 96% 92% 94% 93%  Weight:      Height:        Intake/Output Summary (Last 24 hours) at 03/15/2019 1012 Last data filed at 03/15/2019 1000 Gross per 24 hour  Intake 2002.19 ml  Output 1702 ml  Net 300.19 ml   Filed Weights   03/11/19 1509 03/12/19 0030  Weight: 131.5 kg 132 kg   Exam  General: Well developed, well nourished, NAD, appears stated age  69: NCAT,  mucous membranes moist.   Neck: Supple, dressing in place, edema and fullness R cervical/supraclavicular area  Cardiovascular: S1 S2 auscultated, no murmur, RRR  Respiratory: Clear to auscultation bilaterally   Breast: Right breast with edema, improving  Abdomen: Soft, obese, nontender, nondistended, + bowel sounds  Extremities: warm dry without cyanosis clubbing or edema  Neuro: AAOx3, nonfocal  Psych: Normal affect and demeanor, pleasant   Data Reviewed: I have personally reviewed following labs and imaging studies  CBC: Recent Labs  Lab 03/11/19 1609  WBC 11.5*  NEUTROABS 8.8*  HGB 13.1  HCT 41.4  MCV 89.2  PLT 703   Basic Metabolic Panel: Recent Labs  Lab 03/11/19 1609 03/11/19 1805 03/13/19 0354 03/14/19 0446  NA 139  --  137 135  K 3.9  --  3.7 4.0  CL 107  --  105 104  CO2 24  --  23 22  GLUCOSE 88  --  103* 96  BUN 16  --  17 18   CREATININE 1.12*  --  1.01* 1.03*  CALCIUM 9.3  --  9.2 8.8*  MG  --  1.9 1.9  --   PHOS  --  3.7  --   --    GFR: Estimated Creatinine Clearance: 102.3 mL/min (A) (by C-G formula based on SCr of 1.03 mg/dL (H)). Liver Function Tests: Recent Labs  Lab 03/11/19 1609  AST 25  ALT 45*  ALKPHOS 65  BILITOT 0.4  PROT 7.8  ALBUMIN 3.9   No results for input(s): LIPASE, AMYLASE in the last 168 hours. No results for input(s): AMMONIA in the last 168 hours. Coagulation Profile: Recent Labs  Lab 03/13/19 1420  INR 1.3*   Cardiac Enzymes: Recent Labs  Lab 03/11/19 1805  TROPONINI <0.03   BNP (last 3 results) No results for input(s): PROBNP in the last 8760 hours. HbA1C: No results for input(s): HGBA1C in the last 72 hours. CBG: Recent Labs  Lab 03/13/19 1605 03/14/19 0032 03/14/19 0926 03/14/19 1730 03/14/19 2325  GLUCAP 87 89 91 121* 102*   Lipid Profile: No results for input(s): CHOL, HDL, LDLCALC, TRIG, CHOLHDL, LDLDIRECT in the last 72 hours. Thyroid Function Tests: No results for input(s): TSH, T4TOTAL, FREET4, T3FREE, THYROIDAB in the last 72 hours. Anemia Panel: No results for input(s): VITAMINB12, FOLATE, FERRITIN, TIBC, IRON, RETICCTPCT in the last 72 hours.  Urine analysis: No results found for: COLORURINE, APPEARANCEUR, LABSPEC, PHURINE, GLUCOSEU, HGBUR, BILIRUBINUR, KETONESUR, PROTEINUR, UROBILINOGEN, NITRITE, LEUKOCYTESUR Sepsis Labs: @LABRCNTIP (procalcitonin:4,lacticidven:4)  ) Recent Results (from the past 240 hour(s))  SARS Coronavirus 2 (CEPHEID - Performed in [facility] hospital lab), Hosp Order     Status: None   Collection Time: 03/05/19  5:10 PM  Result Value Ref Range Status   SARS Coronavirus 2 NEGATIVE NEGATIVE Final    Comment: (NOTE) If result is NEGATIVE SARS-CoV-2 target nucleic acids are NOT DETECTED. The SARS-CoV-2 RNA is generally detectable in upper and lower  respiratory specimens during the acute phase of infection. The lowest   concentration of SARS-CoV-2 viral copies this assay can detect is 250  copies / mL. A negative result does not preclude SARS-CoV-2 infection  and should not be used as the sole basis for treatment or other  patient management decisions.  A negative result may occur with  improper specimen collection / handling, submission of specimen other  than nasopharyngeal swab, presence of viral mutation(s) within the  areas targeted by this assay, and inadequate number of viral copies  (<250 copies / mL). A negative result must be combined with clinical  observations, patient history, and epidemiological information. If result is POSITIVE SARS-CoV-2 target nucleic acids are DETECTED. The SARS-CoV-2 RNA is generally detectable in upper and lower  respiratory specimens dur ing the acute phase of infection.  Positive  results are indicative of active infection with SARS-CoV-2.  Clinical  correlation with patient history and other diagnostic information is  necessary to determine patient infection status.  Positive results do  not rule out bacterial infection or co-infection with other viruses. If result is PRESUMPTIVE POSTIVE SARS-CoV-2 nucleic acids MAY BE PRESENT.   A presumptive positive result was obtained on the submitted specimen  and confirmed on repeat testing.  While 2019 novel coronavirus  (SARS-CoV-2) nucleic acids may be present in the submitted sample  additional confirmatory testing may be necessary for epidemiological  and / or clinical management purposes  to differentiate between  SARS-CoV-2 and other Sarbecovirus currently known to infect humans.  If clinically indicated additional testing with an alternate test  methodology [phone]) is advised. The SARS-CoV-2 RNA is generally  detectable in upper and lower respiratory sp ecimens during the acute  phase of infection. The expected result is Negative. Fact Sheet for Patients:  StrictlyIdeas.no Fact Sheet  for Healthcare Providers: BankingDealers.co.za This test is not yet approved or cleared by the [place] FDA and has been authorized for detection and/or diagnosis of SARS-CoV-2 by FDA under an Emergency Use Authorization (EUA).  This EUA will remain in effect (meaning this test can be used) for the duration of the COVID-19 declaration under Section 564(b)(1) of the Act, 21 U.S.C. section 360bbb-3(b)(1), unless the authorization is terminated or revoked sooner. Performed at [facility], [address], [place], [place] [address]   MRSA PCR Screening     Status: None   Collection Time: 03/05/19  5:15 PM  Result Value Ref Range Status   MRSA by PCR NEGATIVE NEGATIVE Final    Comment:        The GeneXpert MRSA Assay (FDA approved for NASAL specimens only), is one component of a comprehensive MRSA colonization surveillance program. It is not intended to diagnose MRSA infection nor to guide or monitor treatment for MRSA infections. Performed at [facility], [address], [place], [place] [address]   Culture, blood (routine x 2)     Status: None  Collection Time: 03/05/19  6:38 PM  Result Value Ref Range Status   Specimen Description RIGHT ANTECUBITAL  Final   Special Requests   Final    BOTTLES DRAWN AEROBIC AND ANAEROBIC Blood Culture adequate volume   Culture   Final    NO GROWTH 5 DAYS Performed at [facility], [address], [place], [place] [address]    Report Status 03/10/2019 FINAL  Final  Culture, blood (routine x 2)     Status: None   Collection Time: 03/05/19  6:42 PM  Result Value Ref Range Status   Specimen Description LEFT ANTECUBITAL  Final   Special Requests   Final    BOTTLES DRAWN AEROBIC AND ANAEROBIC Blood Culture adequate volume   Culture   Final    NO GROWTH 5 DAYS Performed at [facility], [address] [place], [place] [address]    Report Status 03/10/2019 FINAL  Final  SARS Coronavirus 2 (CEPHEID - Performed in [place] hospital lab), Hosp Order     Status: None   Collection Time: 03/11/19  8:55 PM  Result Value Ref Range Status   SARS Coronavirus 2 NEGATIVE NEGATIVE Final    Comment: (NOTE) If result is NEGATIVE SARS-CoV-2 target nucleic acids are NOT DETECTED. The SARS-CoV-2 RNA is generally detectable in upper and lower  respiratory specimens during the acute phase of infection. The lowest  concentration of SARS-CoV-2 viral copies this assay can detect is 250  copies / mL. A negative result does not preclude SARS-CoV-2 infection  and should not be used as the sole basis for treatment or other  patient management decisions.  A negative result may occur with  improper specimen collection / handling, submission of specimen other  than nasopharyngeal swab, presence of viral mutation(s) within the  areas targeted by this assay, and inadequate number of viral copies  (<250 copies / mL). A negative result must be combined with clinical  observations, patient history, and epidemiological information. If result is POSITIVE SARS-CoV-2 target nucleic acids are DETECTED. The SARS-CoV-2 RNA is generally detectable in upper and lower  respiratory specimens dur ing the acute phase of infection.  Positive  results are indicative of active infection with SARS-CoV-2.  Clinical  correlation with patient history and other diagnostic information is  necessary to determine patient infection status.  Positive results do  not rule out bacterial infection or co-infection with other viruses. If result is PRESUMPTIVE POSTIVE SARS-CoV-2 nucleic acids MAY BE PRESENT.   A presumptive positive result was obtained on the submitted specimen  and confirmed on repeat testing.  While 2019 novel coronavirus  (SARS-CoV-2) nucleic acids may be present in the submitted sample  additional confirmatory testing may be necessary for epidemiological  and / or clinical management purposes  to differentiate between  SARS-CoV-2 and  other Sarbecovirus currently known to infect humans.  If clinically indicated additional testing with an alternate test  methodology [phone]) is advised. The SARS-CoV-2 RNA is generally  detectable in upper and lower respiratory sp ecimens during the acute  phase of infection. The expected result is Negative. Fact Sheet for Patients:  StrictlyIdeas.no Fact Sheet for Healthcare Providers: BankingDealers.co.za This test is not yet approved or cleared by the [place] FDA and has been authorized for detection and/or diagnosis of SARS-CoV-2 by FDA under an Emergency Use Authorization (EUA).  This EUA will remain in effect (meaning this test can be used) for the duration of the COVID-19 declaration under Section 564(b)(1) of the Act,  21 U.S.C. section 360bbb-3(b)(1), unless the authorization is terminated or revoked sooner. Performed at [facility], [address], [place], [place] [address]   Aerobic/Anaerobic Culture (surgical/deep wound)     Status: None (Preliminary result)   Collection Time: 03/15/19  8:15 AM  Result Value Ref Range Status   Specimen Description ABSCESS  Final   Special Requests RETROPHARYNGEAL  Final   Gram Stain   Final    NO WBC SEEN NO ORGANISMS SEEN Performed at [facility], [address], [place], [place] [address]    Culture PENDING  Incomplete   Report Status PENDING  Incomplete      Radiology Studies: [place] Core Biopsy (lymph Nodes)  Result Date: 03/14/2019 INDICATION: 38 year old with cervical and mediastinal lymphadenopathy. Recent tooth extractions for dental abscess. Patient also has a retropharyngeal effusion. Plan for ultrasound-guided biopsy of the right cervical lymphadenopathy. EXAM: ULTRASOUND-GUIDED RIGHT SUPRACLAVICULAR LYMPH NODE BIOPSY MEDICATIONS: None. ANESTHESIA/SEDATION: Moderate (conscious) sedation was employed during this procedure. A total of Versed 2.0 mg and Fentanyl 100 mcg  was administered intravenously. Moderate Sedation Time: 15 minutes the patient's level of consciousness and vital signs were monitored continuously by radiology nursing throughout the procedure under my direct supervision. FLUOROSCOPY TIME:  None COMPLICATIONS: None immediate. PROCEDURE: Informed written consent was obtained from the patient after a thorough discussion of the procedural risks, benefits and alternatives. A timeout was performed prior to the initiation of the procedure. Right side of the neck was prepped with chlorhexidine and sterile field was created. Skin was anesthetized with 1% lidocaine. Using ultrasound guidance, 18 gauge core needle was directed into the large nodal mass in the right supraclavicular area. A total of 6 core biopsies were obtained with an 18 gauge core device. Specimens placed in saline. Bandage placed over the puncture site. FINDINGS: Large lymph nodes on both sides of the neck. Large irregular nodal mass in the right supraclavicular area was targeted for biopsy. Needle position was confirmed within this nodal mass. No evidence for bleeding or hematoma formation at the end of the procedure. Visualized right internal jugular vein is small and does not completely compress. Evidence for nonocclusive thrombus in the right internal jugular vein. Findings are similar to the recent CT. IMPRESSION: Ultrasound-guided core biopsies of the right supraclavicular nodal mass. Abnormal appearance of the right internal jugular vein. Findings are compatible with nonocclusive thrombus of unknown age. Findings are similar to the recent CT and could be secondary from the inflammatory changes in the neck and compression from the large supraclavicular nodal mass. Electronically Signed   By: Markus Daft M.D.   On: 03/14/2019 17:13     Scheduled Meds: . buPROPion  150 mg Oral Daily  . enoxaparin (LOVENOX) injection  40 mg Subcutaneous Q24H   Continuous Infusions: . sodium chloride 125 mL/hr at  03/15/19 0932  . clindamycin (CLEOCIN) IV 900 mg (03/15/19 0927)     LOS: 4 days   Time Spent in minutes   30 minutes  Emera Bussie D.O. on 03/15/2019 at 10:12 AM  Between 7am to 7pm - Please see pager noted on [email or web address]  After 7pm go to [email or web address]  And look for the night coverage person covering for me after hours  [facility] Office  [phone]

## 2019-03-15 NOTE — Anesthesia Postprocedure Evaluation (Signed)
Anesthesia Post Note  Patient: Nikiyah Fackler  Procedure(s) Performed: IRRIGATION AND DEBRIDEMENT OF RETROPHARYNGEAL ABSCESS (N/A Throat)     Patient location during evaluation: PACU Anesthesia Type: General Level of consciousness: awake and alert Pain management: pain level controlled Vital Signs Assessment: post-procedure vital signs reviewed and stable Respiratory status: spontaneous breathing, nonlabored ventilation, respiratory function stable and patient connected to nasal cannula oxygen Cardiovascular status: blood pressure returned to baseline and stable Postop Assessment: no apparent nausea or vomiting Anesthetic complications: no    Last Vitals:  Vitals:   03/15/19 0911 03/15/19 0915  BP: (!) 131/93 (!) 103/47  Pulse: 98 100  Resp:  13  Temp:    SpO2: 94% 93%    Last Pain:  Vitals:   03/15/19 0940  TempSrc:   PainSc: Asleep                 Lidia Collum

## 2019-03-16 ENCOUNTER — Encounter (HOSPITAL_COMMUNITY): Payer: Self-pay | Admitting: Otolaryngology

## 2019-03-16 ENCOUNTER — Inpatient Hospital Stay (HOSPITAL_COMMUNITY): Payer: BLUE CROSS/BLUE SHIELD

## 2019-03-16 LAB — CBC
HCT: 41.7 % (ref 36.0–46.0)
Hemoglobin: 13.5 g/dL (ref 12.0–15.0)
MCH: 28.1 pg (ref 26.0–34.0)
MCHC: 32.4 g/dL (ref 30.0–36.0)
MCV: 86.7 fL (ref 80.0–100.0)
Platelets: 293 10*3/uL (ref 150–400)
RBC: 4.81 MIL/uL (ref 3.87–5.11)
RDW: 14.9 % (ref 11.5–15.5)
WBC: 13.4 10*3/uL — ABNORMAL HIGH (ref 4.0–10.5)
nRBC: 0 % (ref 0.0–0.2)

## 2019-03-16 LAB — BASIC METABOLIC PANEL
Anion gap: 8 (ref 5–15)
BUN: 12 mg/dL (ref 6–20)
CO2: 25 mmol/L (ref 22–32)
Calcium: 9.5 mg/dL (ref 8.9–10.3)
Chloride: 105 mmol/L (ref 98–111)
Creatinine, Ser: 0.97 mg/dL (ref 0.44–1.00)
GFR calc Af Amer: >60 mL/min (ref >60)
GFR calc non Af Amer: >60 mL/min (ref >60)
Glucose, Bld: 124 mg/dL — ABNORMAL HIGH (ref 70–99)
Potassium: 4.2 mmol/L (ref 3.5–5.1)
Sodium: 138 mmol/L (ref 135–145)

## 2019-03-16 LAB — GLUCOSE, CAPILLARY
Glucose-Capillary: 166 mg/dL — ABNORMAL HIGH (ref 70–99)
Glucose-Capillary: 125 mg/dL — ABNORMAL HIGH (ref 70–99)

## 2019-03-16 LAB — MAGNESIUM: Magnesium: 2.1 mg/dL (ref 1.7–2.4)

## 2019-03-16 MED ORDER — IOHEXOL 300 MG/ML  SOLN
125.0000 mL | Freq: Once | INTRAMUSCULAR | Status: AC | PRN
  Administered 2019-03-16: 125 mL via INTRAVENOUS

## 2019-03-16 NOTE — Progress Notes (Signed)
PROGRESS NOTE    Tara Aguilar  NKN:[id number] DOB: [birth year] DOA: 03/11/2019 PCP: Medicine, [facility]   Brief Narrative:  HPI On 03/11/2019 by Dr. Tennis Must Tara Aguilar [name] 38 y.o.femalewith medical history significant ofdepression, glucose intolerance, class III obesity who was recently admitted on 05/09/2020and discharged 3 days later due to Tara Aguilar peritonsillar abscess.She was treated with clindamycin and analgesics. She underwent teeth extraction on Tuesday and has remained taking her oral clindamycin. However, the patient states that she has been progressively dyspneic in the past few days, particularly since yesterday. She has also developed swelling of the right neck, right upper chest and right breast area. She has been havingpain in the area. However she denies fever, chills, rhinorrhea but feels fatigue. She occasionally some mild productive sputum. She denies hemoptysis, wheezing or stridor she denies sick contacts or travel history.. However, she states that she has been having trouble lying down and gets orthopneic quickly. She denies chest pain, palpitations, diaphoresis, pitting edema of the lower extremities, but has felt lightheaded and has had orthopnea. Her appetite is decreased. She denies abdominal pain, nausea, emesis, diarrhea, constipation, melena or hematochezia. No dysuria, frequency or hematuria. She denies polyuria, polydipsia, polyphagia or blurred vision. She has been anxious and having some difficulty sleeping.  Interim history Admitted for dyspnea. Concern for lymphoma given CT results. IR consulted for biopsy. ENT consulted for retropharyngeal abscess.  Assessment & Plan:   Principal Problem:   Mass of mediastinum Active Problems:   Depression   Retropharyngeal abscess   Dental abscess   Glucose intolerance   Obesity, Class III, BMI 40-49.9 (morbid obesity) ([place])   Tobacco use   Difficult airway for intubation   Mediastinal  lymphadenopathy with dyspnea -?  Concern for lymphoma/malignancy - Discussed with pathology who note concern for metastatic carcinoma (unofficial read, official read still pending)  -CTA chest unremarkable for PE (poor study, no obvious central PE).  Very bulky lower right cervical and right superior mediastinal lymphadenopathy largest measuring 5.8 cm.  Appears to obstruct of the right brachiocephalic vein and superior vena cava.  Suspicious for malignancy including lymphoma or metastatic disease. -Interventional radiology consulted and appreciated, s/p ultrasound-guided right supraclavicular lymph node biopsy - results pending as noted above -Discussed with oncology, will need to obtain biopsy - will discuss prelim reads tomorrow as noted above - repeat CT abdomen done today due to bilateral adrenal nodules - consider MRI -Patient was given 1 dose of dexamethasone 8 mg in the emergency department -Continue pain control -Complaining of dyspnea with minimal exertion.   -BNP 29 -Echocardiogram: EF 60-65% -CT abd/pelvis: no acute abnormalities in the abd or pelvis. B/L adrenal masses  Recent retropharyngeal/dental abscess -Status post dental extraction -Now POD 1 s/p I&D of retropharyngeal effusion (culture pending) -Upon last admission, ENT was consulted, patient was started on clindamycin -ENT, Dr. Benjamine Mola, consulted and appreciated -S/p incision and drainage of retropharyngeal effusion/abscess -Small amount of serosanguineous fluid was obtained and sent for aerobic and anaerobic cultures  Depression -Continue Wellbutrin  Glucose intolerance/prediabetes -A1c 6.2 -Patient did receive Decadron while in the emergency department -Continue to monitor CBGs, if needed will start insulin sliding scale  Morbid obesity -BMI 86.97 -Patient to discuss lifestyle modifications with PCP  Tobacco abuse -Discussed cessation -Declined nicotine patch  Undiagnosed sleep apnea -tells me she  snores and has never had [name] sleep study  Sinus pauses/Bradycardia -continues to have bradycardia and pauses overnight -will obtain echocardiogram - EF 60-65% -potassium and magnesium WNL  DVT  Prophylaxis  lovenox  DVT prophylaxis: lovenox Code Status: full  Family Communication: none at bedside Disposition Plan: pending further w/u of lad   Consultants:   ENT  IR  Procedures: Echo 1. The left ventricle has normal systolic function, with an ejection fraction of 60-65%. The cavity size was normal. No evidence of left ventricular regional wall motion abnormalities.  Antimicrobials:  Anti-infectives (From admission, onward)   Start     Dose/Rate Route Frequency Ordered Stop   03/12/19 0200  clindamycin (CLEOCIN) IVPB 900 mg     900 mg 100 mL/hr over 30 Minutes Intravenous Every 8 hours 03/12/19 0105       Subjective: Feels ok. Nervous about diagnosis  Objective: Vitals:   03/15/19 2347 03/16/19 0350 03/16/19 0755 03/16/19 1142  BP: (!) 146/99 129/79 118/78 123/89  Pulse: (!) 107 (!) 104 97 (!) 102  Resp: 20 (!) 23 19 20   Temp: 98 F (36.7 C) 97.6 F (36.4 C)  97.8 F (36.6 C)  TempSrc: Oral Oral  Oral  SpO2: 95% 96% 96% 96%  Weight:      Height:        Intake/Output Summary (Last 24 hours) at 03/16/2019 1724 Last data filed at 03/16/2019 1447 Gross per 24 hour  Intake 1265.33 ml  Output 3950 ml  Net -2684.67 ml   Filed Weights   03/11/19 1509 03/12/19 0030  Weight: 131.5 kg 132 kg    Examination:  General exam: Appears calm and comfortable, obese  Respiratory system: Clear to auscultation. Respiratory effort normal. Cardiovascular system: S1 & S2 heard, RRR.  Gastrointestinal system: Abdomen is nondistended, soft and nontender.  Central nervous system: Alert and oriented. No focal neurological deficits. Extremities: moving all extremities equally Skin: No rashes, lesions or ulcers Psychiatry: Judgement and insight appear normal. Mood & affect  appropriate.     Data Reviewed: I have personally reviewed following labs and imaging studies  CBC: Recent Labs  Lab 03/11/19 1609 03/16/19 0809  WBC 11.5* 13.4*  NEUTROABS 8.8*  --   HGB 13.1 13.5  HCT 41.4 41.7  MCV 89.2 86.7  PLT 250 384   Basic Metabolic Panel: Recent Labs  Lab 03/11/19 1609 03/11/19 1805 03/13/19 0354 03/14/19 0446 03/16/19 0809  NA 139  --  137 135 138  K 3.9  --  3.7 4.0 4.2  CL 107  --  105 104 105  CO2 24  --  23 22 25   GLUCOSE 88  --  103* 96 124*  BUN 16  --  17 18 12   CREATININE 1.12*  --  1.01* 1.03* 0.97  CALCIUM 9.3  --  9.2 8.8* 9.5  MG  --  1.9 1.9  --  2.1  PHOS  --  3.7  --   --   --    GFR: Estimated Creatinine Clearance: 108.7 mL/min (by C-G formula based on SCr of 0.97 mg/dL). Liver Function Tests: Recent Labs  Lab 03/11/19 1609  AST 25  ALT 45*  ALKPHOS 65  BILITOT 0.4  PROT 7.8  ALBUMIN 3.9   No results for input(s): LIPASE, AMYLASE in the last 168 hours. No results for input(s): AMMONIA in the last 168 hours. Coagulation Profile: Recent Labs  Lab 03/13/19 1420  INR 1.3*   Cardiac Enzymes: Recent Labs  Lab 03/11/19 1805  TROPONINI <0.03   BNP (last 3 results) No results for input(s): PROBNP in the last 8760 hours. HbA1C: No results for input(s): HGBA1C in the last 72 hours.  CBG: Recent Labs  Lab 03/14/19 2325 03/15/19 1135 03/15/19 2102 03/16/19 0630 03/16/19 1444  GLUCAP 102* 114* 159* 166* 125*   Lipid Profile: No results for input(s): CHOL, HDL, LDLCALC, TRIG, CHOLHDL, LDLDIRECT in the last 72 hours. Thyroid Function Tests: No results for input(s): TSH, T4TOTAL, FREET4, T3FREE, THYROIDAB in the last 72 hours. Anemia Panel: No results for input(s): VITAMINB12, FOLATE, FERRITIN, TIBC, IRON, RETICCTPCT in the last 72 hours. Sepsis Labs: No results for input(s): PROCALCITON, LATICACIDVEN in the last 168 hours.  Recent Results (from the past 240 hour(s))  SARS Coronavirus 2 (CEPHEID -  Performed in [place] hospital lab), Hosp Order     Status: None   Collection Time: 03/11/19  8:55 PM  Result Value Ref Range Status   SARS Coronavirus 2 NEGATIVE NEGATIVE Final    Comment: (NOTE) If result is NEGATIVE SARS-CoV-2 target nucleic acids are NOT DETECTED. The SARS-CoV-2 RNA is generally detectable in upper and lower  respiratory specimens during the acute phase of infection. The lowest  concentration of SARS-CoV-2 viral copies this assay can detect is 250  copies / mL. Caliana Spires negative result does not preclude SARS-CoV-2 infection  and should not be used as the sole basis for treatment or other  patient management decisions.  Dhruti Ghuman negative result may occur with  improper specimen collection / handling, submission of specimen other  than nasopharyngeal swab, presence of viral mutation(s) within the  areas targeted by this assay, and inadequate number of viral copies  (<250 copies / mL). Gunhild Bautch negative result must be combined with clinical  observations, patient history, and epidemiological information. If result is POSITIVE SARS-CoV-2 target nucleic acids are DETECTED. The SARS-CoV-2 RNA is generally detectable in upper and lower  respiratory specimens dur ing the acute phase of infection.  Positive  results are indicative of active infection with SARS-CoV-2.  Clinical  correlation with patient history and other diagnostic information is  necessary to determine patient infection status.  Positive results do  not rule out bacterial infection or co-infection with other viruses. If result is PRESUMPTIVE POSTIVE SARS-CoV-2 nucleic acids MAY BE PRESENT.   Rosey Eide presumptive positive result was obtained on the submitted specimen  and confirmed on repeat testing.  While 2019 novel coronavirus  (SARS-CoV-2) nucleic acids may be present in the submitted sample  additional confirmatory testing may be necessary for epidemiological  and / or clinical management purposes  to differentiate between   SARS-CoV-2 and other Sarbecovirus currently known to infect humans.  If clinically indicated additional testing with an alternate test  methodology [phone]) is advised. The SARS-CoV-2 RNA is generally  detectable in upper and lower respiratory sp ecimens during the acute  phase of infection. The expected result is Negative. Fact Sheet for Patients:  StrictlyIdeas.no Fact Sheet for Healthcare Providers: BankingDealers.co.za This test is not yet approved or cleared by the [place] FDA and has been authorized for detection and/or diagnosis of SARS-CoV-2 by FDA under an Emergency Use Authorization (EUA).  This EUA will remain in effect (meaning this test can be used) for the duration of the COVID-19 declaration under Section 564(b)(1) of the Act, 21 U.S.C. section 360bbb-3(b)(1), unless the authorization is terminated or revoked sooner. Performed at [facility], [address], [place], [place] [address]   Aerobic/Anaerobic Culture (surgical/deep wound)     Status: None (Preliminary result)   Collection Time: 03/15/19  8:15 AM  Result Value Ref Range Status   Specimen Description ABSCESS  Final   Special Requests  RETROPHARYNGEAL  Final   Gram Stain NO WBC SEEN NO ORGANISMS SEEN   Final   Culture   Final    NO GROWTH 1 DAY Performed at [facility], [place] [address] [place], [place] [address]    Report Status PENDING  Incomplete         Radiology Studies: Ct Abdomen W Wo Contrast  Result Date: 03/16/2019 CLINICAL DATA:  Bilateral adrenal nodules. EXAM: CT ABDOMEN WITHOUT AND WITH CONTRAST TECHNIQUE: Multidetector CT imaging of the abdomen was performed following the standard protocol before and following the bolus administration of intravenous contrast. CONTRAST:  169mL OMNIPAQUE IOHEXOL 300 MG/ML  SOLN COMPARISON:  03/12/2019 FINDINGS: Lower chest: Right lower lobe collapse/consolidation. Hepatobiliary: Wedge-shaped area  of altered perfusion noted in the lateral segment left liver along the falciform ligament. No suspicious focal abnormality within the liver parenchyma. Gallbladder decompressed. No intrahepatic or extrahepatic biliary dilation. Pancreas: No focal mass lesion. No dilatation of the main duct. No intraparenchymal cyst. No peripancreatic edema. Spleen: No splenomegaly. No focal mass lesion. Adrenals/Urinary Tract: 2.1 cm right adrenal nodule again noted. Absolute attenuation of this nodule is too high to allow classification as adenoma. Lesion shows no substantial enhancement after IV contrast administration and cannot be definitively characterized based on absolute washout or relative washout characteristics. 2.5 cm left adrenal nodule also has attenuation too high on precontrast imaging to allow characterization as an adenoma. Absolute washout and relative washout characteristics of this nodule or indeterminate. Kidneys unremarkable. Stomach/Bowel: Stomach is unremarkable. No gastric wall thickening. No evidence of outlet obstruction. Duodenum is normally positioned as is the ligament of Treitz. Small bowel loops and colonic segments of the abdomen are nondilated. Vascular/Lymphatic: No abdominal aortic aneurysm. There is no gastrohepatic or hepatoduodenal ligament lymphadenopathy. No intraperitoneal or retroperitoneal lymphadenopathy. Other: No intraperitoneal free fluid. Musculoskeletal: No worrisome lytic or sclerotic osseous abnormality. IMPRESSION: 1. Bilateral adrenal nodules as noted on recent CT scan. These nodules cannot be characterized by either absolute attenuation or washout characteristics. If these represent lipid poor adenomas, MRI of the abdomen may be able to further characterize. Given the history of thoracic lymphadenopathy, close follow-up recommended. Electronically Signed   By: Misty Stanley M.D.   On: 03/16/2019 16:47        Scheduled Meds: . buPROPion  150 mg Oral Daily  . enoxaparin  (LOVENOX) injection  40 mg Subcutaneous Q24H   Continuous Infusions: . clindamycin (CLEOCIN) IV 900 mg (03/16/19 0959)     LOS: 5 days    Time spent: over 30 min    Fayrene Helper, MD [facility] Pager AMION  If 7PM-7AM, please contact night-coverage [email or web address] Password [facility] 03/16/2019, 5:24 PM

## 2019-03-16 NOTE — Progress Notes (Signed)
Subjective: No new complaint.  Reports mild sore throat. No breathing difficulty.  Objective: Vital signs in last 24 hours: Temp:  [97.6 F (36.4 C)-98.4 F (36.9 C)] 97.6 F (36.4 C) (05/20 0350) Pulse Rate:  [97-107] 97 (05/20 0755) Resp:  [15-23] 19 (05/20 0755) BP: (118-146)/(66-99) 118/78 (05/20 0755) SpO2:  [95 %-96 %] 96 % (05/20 0755)  Physical Exam: General:Awake and alert and oriented x3. No acute distress. Eyes:Pupils are equal, round, reactive to light. Extraocular motion is intact.  Ears: Examination of the ears shows normal auricles and external auditory canals bilaterally. Both tympanic membranes are intact. Face:Facial edema and erythema have resolved. Nose: Nasal examination shows normal mucosa, septum, turbinates.  Mouth:No significant purulent drainage is noted today. Neck:Obese. Palpation of the neck reveals bilateral lymphadenopathy.The trachea is midline. The thyroid is not significantly enlarged.  Neuro: Cranial nerves 2-12 are all grossly in tact. Chest:Good respiratory effort. No accessory muscle use.No stridor. Psychiatric:Intact judgment and insight. Pleasant and cooperative. Neurologic:Cranial nerves II through XII are grossly intact.  Recent Labs    03/16/19 0809  WBC 13.4*  HGB 13.5  HCT 41.7  PLT 293   Recent Labs    03/14/19 0446 03/16/19 0809  NA 135 138  K 4.0 4.2  CL 104 105  CO2 22 25  GLUCOSE 96 124*  BUN 18 12  CREATININE 1.03* 0.97  CALCIUM 8.8* 9.5    Medications:  I have reviewed the patient's current medications. Scheduled: . buPROPion  150 mg Oral Daily  . enoxaparin (LOVENOX) injection  40 mg Subcutaneous Q24H   Continuous: . clindamycin (CLEOCIN) IV 900 mg (03/16/19 0959)    Assessment/Plan: POD #1 s/p I&D of retropharyngeal effusion and biopsy of her enlarged lymph nodes. - Awaiting pathology results. - No other ENT intervention needed at this time. - Will follow.   LOS: 5 days   Khoa Opdahl W  Ailed Defibaugh 03/16/2019, 11:42 AM

## 2019-03-17 ENCOUNTER — Ambulatory Visit: Payer: BC Managed Care – PPO | Admitting: Radiation Oncology

## 2019-03-17 ENCOUNTER — Ambulatory Visit
Admit: 2019-03-17 | Discharge: 2019-03-17 | Disposition: A | Discharge status: Home / Self Care | Payer: BC Managed Care – PPO | Attending: Radiation Oncology | Admitting: Radiation Oncology

## 2019-03-17 ENCOUNTER — Ambulatory Visit
Admit: 2019-03-17 | Discharge: 2019-03-17 | Disposition: A | Discharge status: Home / Self Care | Payer: BC Managed Care – PPO | Source: Ambulatory Visit | Attending: Radiation Oncology | Admitting: Radiation Oncology

## 2019-03-17 ENCOUNTER — Encounter (HOSPITAL_COMMUNITY): Payer: Self-pay | Admitting: Oncology

## 2019-03-17 ENCOUNTER — Ambulatory Visit (INDEPENDENT_AMBULATORY_CARE_PROVIDER_SITE_OTHER): Payer: BLUE CROSS/BLUE SHIELD | Admitting: Otolaryngology

## 2019-03-17 DIAGNOSIS — J9859 Other diseases of mediastinum, not elsewhere classified: Secondary | ICD-10-CM

## 2019-03-17 DIAGNOSIS — K047 Periapical abscess without sinus: Secondary | ICD-10-CM

## 2019-03-17 DIAGNOSIS — I871 Compression of vein: Secondary | ICD-10-CM | POA: Insufficient documentation

## 2019-03-17 DIAGNOSIS — Z79899 Other long term (current) drug therapy: Secondary | ICD-10-CM | POA: Insufficient documentation

## 2019-03-17 DIAGNOSIS — Z510 Encounter for antineoplastic radiation therapy: Secondary | ICD-10-CM | POA: Insufficient documentation

## 2019-03-17 DIAGNOSIS — J390 Retropharyngeal and parapharyngeal abscess: Secondary | ICD-10-CM | POA: Insufficient documentation

## 2019-03-17 DIAGNOSIS — C801 Malignant (primary) neoplasm, unspecified: Secondary | ICD-10-CM

## 2019-03-17 DIAGNOSIS — C771 Secondary and unspecified malignant neoplasm of intrathoracic lymph nodes: Secondary | ICD-10-CM

## 2019-03-17 DIAGNOSIS — C779 Secondary and unspecified malignant neoplasm of lymph node, unspecified: Secondary | ICD-10-CM | POA: Insufficient documentation

## 2019-03-17 DIAGNOSIS — F17210 Nicotine dependence, cigarettes, uncomplicated: Secondary | ICD-10-CM | POA: Insufficient documentation

## 2019-03-17 LAB — GLUCOSE, CAPILLARY
Glucose-Capillary: 92 mg/dL (ref 70–99)
Glucose-Capillary: 97 mg/dL (ref 70–99)

## 2019-03-17 LAB — CBC
HCT: 39.4 % (ref 36.0–46.0)
Hemoglobin: 12.7 g/dL (ref 12.0–15.0)
MCH: 28.3 pg (ref 26.0–34.0)
MCHC: 32.2 g/dL (ref 30.0–36.0)
MCV: 87.9 fL (ref 80.0–100.0)
Platelets: 276 10*3/uL (ref 150–400)
RBC: 4.48 MIL/uL (ref 3.87–5.11)
RDW: 15.4 % (ref 11.5–15.5)
WBC: 10.1 10*3/uL (ref 4.0–10.5)
nRBC: 0 % (ref 0.0–0.2)

## 2019-03-17 LAB — BASIC METABOLIC PANEL
Anion gap: 8 (ref 5–15)
BUN: 16 mg/dL (ref 6–20)
CO2: 26 mmol/L (ref 22–32)
Calcium: 9.4 mg/dL (ref 8.9–10.3)
Chloride: 104 mmol/L (ref 98–111)
Creatinine, Ser: 1.12 mg/dL — ABNORMAL HIGH (ref 0.44–1.00)
GFR calc Af Amer: >60 mL/min (ref >60)
GFR calc non Af Amer: >60 mL/min (ref >60)
Glucose, Bld: 113 mg/dL — ABNORMAL HIGH (ref 70–99)
Potassium: 4.1 mmol/L (ref 3.5–5.1)
Sodium: 138 mmol/L (ref 135–145)

## 2019-03-17 LAB — MAGNESIUM: Magnesium: 1.9 mg/dL (ref 1.7–2.4)

## 2019-03-17 MED ORDER — MORPHINE SULFATE 4 MG/ML IJ SOLN
2.0000 mg | Freq: Once | INTRAMUSCULAR | Status: AC
  Administered 2019-03-17: 2 mg via INTRAVENOUS
  Filled 2019-03-17: qty 1

## 2019-03-17 MED ORDER — FUROSEMIDE 10 MG/ML IJ SOLN
20.0000 mg | Freq: Once | INTRAMUSCULAR | Status: AC
  Administered 2019-03-17: 20 mg via INTRAVENOUS
  Filled 2019-03-17: qty 2

## 2019-03-17 NOTE — Consult Note (Addendum)
[place]  [phone] [phone]  ID: Tara Aguilar DOB: 05/08/1981 MR#: [id number] TOI#:[id number] PCP: [facility], [facility]  CHIEF COMPLAINT: Newly diagnosed non-small cell cancer, squamous cell; SVC syndrome  INTERVAL HISTORY: Tara Aguilar is a 38 year old female from [place], [place].  She has a past medical history including depression and anxiety. The patient was admitted to [facility] on 03/11/2019. She had been seen with her PCP for face and neck swelling and seen in the ED at [facility], and treated for an abscessed tooth with clindamycin and IV steroids. She was seen in the ED [aph] after symptoms worsened and a CT on 03/04/2019 revealed an odontogenic abscess with overlying cellulitis. She was given Augmentin and sent home. She was admitted on 03/05/2019 and repeat CT of the neck revealed a 2.4 cm retropharyngeal fluid collection at C3 level and mild mass effect on the posterior oropharynx. No peritonsilar abscess was noted. She had cervical adenopathy including a level IIa node measuring 1.6 cm on the right and 1.4 cm on the left. There was a right sided 6.1 x 4.8 cm nodal conglomerate at level IV. There was also partially visualized adenopathy measuring 6.4 x 4.1 cm in the right paratracheal region. She did not warrant urgent oral or ENT intervention, and discharged on 03/08/2019.   She returned to the ED on 03/11/2019 complaining of shortness of breath.  CT angiography of the chest and of the neck on 03/11/2019 revealed No evidence of pulmonary embolism, but did see bulky lymphadenopathy obstructing the right brachiocephalic vein and superior vena cava, redemonstrating very bulky lower right cervical and right superior mediastinal adenopathy with the largest nodes measuring at least 5.8 cm.  No lesion was specifically seen in the lung parenchyma.  She had bilateral adrenal nodules measuring 2.1 cm on the left and 1.3 cm on the right.  She did undergo  interval extraction of multiple right maxillary teeth with residual inflammatory stranding and foci of gas overlying the right maxilla consistent with recent postsurgical change, no residual abscess or drainable fluid collection was noted, and hazy inflammatory stranding within the anterior neck inferiorly compatible with cellulitis was seen.  This was progressive compared to 03/05/2019 scan.  There was a persistent retropharyngeal effusion with mild mass-effect slightly increased in size from previous, her supraglottic airway was mildly narrowed but was patent.  Similar appearing cervical and mediastinal adenopathy was noted.    A CT of the abdomen and pelvis with contrast on 03/12/2019 revealed borderline hepatomegaly without focal parenchymal abnormality, normal anatomic variant but the left lobe of the liver crosses the midline, bilateral adrenal masses the left measuring 16 x 25 x 22 mm and the right 13 x 21 x 27 mm.  Atherosclerotic changes involving the right common iliac artery were noted, enlarged hemiazygous vein in the retroperitoneum was noted likely a collateral plaque pathway due to her SVC compression, no abnormalities were seen in the reproductive organs.  On 03/14/2019 she underwent biopsy of her right cervical adenopathy.    Final pathology returned earlier this afternoon which shows non-small cell cancer, squamous cell. There was a 2.1 cm right adrenal nodule absolute attenuation of the nodules too high to allow classification as adenoma, the lesion shows no substantial enhancement after IV contrast administration and cannot be definitively characterized based on absolute washout.  2.5 cm nodule was noted on the left adrenal gland with attenuation too high on precontrast imaging to allow characterization his adenoma absolute washout and relative washout  characteristics were indeterminant.    Medical oncology was asked see the patient to make recommendations regarding her newly diagnosed squamous  cell carcinoma.  REVIEW OF SYSTEMS: When seen today, the patient reports ongoing facial, neck, chest, and right arm swelling.  She reports occasional chest discomfort but this is very intermittent.  Denies shortness of breath.  Reports intermittent nonproductive cough.  Denies anorexia, weight loss, night sweats.  Denies fevers and chills.  A comprehensive 14 point review of systems was otherwise negative.  PAST MEDICAL HISTORY: Past Medical History:  Diagnosis Date  . Depression   . Glucose intolerance 03/11/2019  . Obesity, Class III, BMI 40-49.9 (morbid obesity) ([place]) 03/11/2019   PAST SURGICAL HISTORY: Past Surgical History:  Procedure Laterality Date  . CESAREAN SECTION    . IRRIGATION AND DEBRIDEMENT ABSCESS  03/15/2019   RETROPHARYNGEAL   . TONSILLECTOMY AND ADENOIDECTOMY N/A 03/15/2019   Procedure: IRRIGATION AND DEBRIDEMENT OF RETROPHARYNGEAL ABSCESS;  Surgeon: Leta Baptist, MD;  Location: [facility] OR;  Service: ENT;  Laterality: N/A;   FAMILY HISTORY Family History  Problem Relation Age of Onset  . Lung cancer Mother   . Hypertension Mother   . Leukemia Father   . Myelodysplastic syndrome Father    GYNECOLOGIC HISTORY: Has 1 child.  Last Pap smear was approximately 2 years ago which she reports is normal.  SOCIAL HISTORY: The patient is widowed.  Her husband died in 15-Oct-2018 due to ampullary cancer. She lives with her son who is 8 years old.  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  She smokes 1 pack of cigarettes per day for the past 25 years.  Drinks alcohol socially.  ADVANCED DIRECTIVES: Reports that she does not have any advanced directives. sHE INTENDS TO NAME HER FRIEND Tara Aguilar AS HER hcpoa  HEALTH MAINTENANCE: Social History   Tobacco Use  . Smoking status: Current Every Day Smoker    Packs/day: 1.00    Years: 25.00    Pack years: 25.00    Types: Cigarettes  . Smokeless tobacco: Never Used  Substance Use Topics  . Alcohol  use: Not on file    Comment: social   . Drug use: Not Currently   Colonoscopy: none PAP: Approximately 2 years ago which was normal Bone density: None Lipid panel: Date unknown No Known Allergies Current Facility-Administered Medications  Medication Dose Route Frequency Provider Last Rate Last Dose  . buPROPion (WELLBUTRIN XL) 24 hr tablet 150 mg  150 mg Oral Daily Reubin Milan, MD   150 mg at 03/17/19 1201  . clindamycin (CLEOCIN) IVPB 900 mg  900 mg Intravenous Q8H Reubin Milan, MD   Stopped at 03/17/19 0240  . enoxaparin (LOVENOX) injection 40 mg  40 mg Subcutaneous Q24H Brynda Greathouse Jovista, PA   40 mg at 03/16/19 2155  . hydrOXYzine (ATARAX/VISTARIL) tablet 50 mg  50 mg Oral Q6H PRN Reubin Milan, MD   50 mg at 03/16/19 2155  . ketorolac (TORADOL) 30 MG/ML injection 30 mg  30 mg Intravenous Q6H PRN Cristal Ford, DO   30 mg at 03/16/19 2155  . ondansetron (ZOFRAN) tablet 4 mg  4 mg Oral Q6H PRN Reubin Milan, MD       Or  . ondansetron [facility]) injection 4 mg  4 mg Intravenous Q6H PRN Reubin Milan, MD   4 mg at 03/15/19 0807   OBJECTIVE: Vitals:   03/17/19 1009 03/17/19 1140  BP: 116/67 139/65  Pulse:  96 (!) 102  Resp: 14 16  Temp: 97.7 F (36.5 C) 98.3 F (36.8 C)  SpO2: 97% 95%   Body mass index is 46.97 kg/m. ECOG FS:1 - Symptomatic but completely ambulatory Ocular: Sclerae unicteric, pupils equal, round and reactive to light Ear-nose-throat: Oropharynx clear, dentition fair Lymphatic: Fullness to her bilateral neck and unable to appreciate any obvious adenopathy Lungs no rales or rhonchi, good excursion bilaterally Heart regular rate and rhythm, no murmur appreciated.  The patient has swelling to her right arm.  Left arm without swelling. Abd soft, nontender, positive bowel sounds MSK no focal spinal tenderness, no joint edema Neuro: non-focal, well-oriented, appropriate affect [Breast exam shows bilateral breast edema, no  erythema, no suspicious masses]  LAB RESULTS: CMP     Component Value Date/Time   NA 138 03/17/2019 0245   K 4.1 03/17/2019 0245   CL 104 03/17/2019 0245   CO2 26 03/17/2019 0245   GLUCOSE 113 (H) 03/17/2019 0245   BUN 16 03/17/2019 0245   CREATININE 1.12 (H) 03/17/2019 0245   CALCIUM 9.4 03/17/2019 0245   PROT 7.8 03/11/2019 1609   ALBUMIN 3.9 03/11/2019 1609   AST 25 03/11/2019 1609   ALT 45 (H) 03/11/2019 1609   ALKPHOS 65 03/11/2019 1609   BILITOT 0.4 03/11/2019 1609   GFRNONAA >60 03/17/2019 0245   GFRAA >60 03/17/2019 0245   INo results found for: SPEP, UPEP Lab Results  Component Value Date   WBC 10.1 03/17/2019   NEUTROABS 8.8 (H) 03/11/2019   HGB 12.7 03/17/2019   HCT 39.4 03/17/2019   MCV 87.9 03/17/2019   PLT 276 03/17/2019   '@LASTCHEMISTRY'$ @ No results found for: LABCA2 No components found for: OYDXA128 Recent Labs  Lab 03/13/19 1420  INR 1.3*   Urinalysis No results found for: COLORURINE, APPEARANCEUR, LABSPEC, PHURINE, GLUCOSEU, HGBUR, BILIRUBINUR, KETONESUR, PROTEINUR, UROBILINOGEN, NITRITE, LEUKOCYTESUR STUDIES: Dg Orthopantogram  Result Date: 03/07/2019 CLINICAL DATA:  Right-sided facial swelling. EXAM: ORTHOPANTOGRAM/PANORAMIC COMPARISON:  None. FINDINGS: Advanced decay of the remaining right maxillary molar, including periapical lucency. Other residual dentition does not show visible decay or periodontal disease. IMPRESSION: Advanced decay and periapical lucency affecting the remaining right maxillary molar. Electronically Signed   By: Nelson Chimes M.D.   On: 03/07/2019 11:00   Dg Chest 2 View  Result Date: 03/05/2019 CLINICAL DATA:  Facial swelling and known peritonsillar abscess EXAM: CHEST - 2 VIEW COMPARISON:  None. FINDINGS: Cardiac shadows within normal limits. Lungs are well aerated bilaterally. Fullness in the right paratracheal region is noted consistent with the lymphadenopathy seen on recent CT of the neck. No focal infiltrate or effusion  is seen. No acute bony abnormality is noted. IMPRESSION: Right paratracheal lymphadenopathy similar to that seen on recent CT examination. No other focal abnormality is noted. Electronically Signed   By: Inez Catalina M.D.   On: 03/05/2019 15:58   Ct Soft Tissue Neck W Contrast  Result Date: 03/11/2019 CLINICAL DATA:  Initial evaluation for neck swelling, recent surgery for odontogenic abscess. EXAM: CT NECK WITH CONTRAST TECHNIQUE: Multidetector CT imaging of the neck was performed using the standard protocol following the bolus administration of intravenous contrast. CONTRAST:  123m OMNIPAQUE IOHEXOL 350 MG/ML SOLN COMPARISON:  Multiple previous CTs from 03/05/2019 and 03/04/2019. FINDINGS: Pharynx and larynx: Oral cavity within normal limits without mass lesion or collection. Since previous exam, there has been interval extraction of the right maxillary central and lateral incisors as well as the adjacent bicuspid. Residual inflammatory stranding overlies the adjacent  maxilla with few small foci of gas, likely postsurgical in nature. No residual abscess now seen. Persistent hazy inflammatory stranding inferiorly along the anterior neck, right slightly worse than left, but definitely progressed as compared to previous exam. Associated thickening of the latissimus musculature bilaterally. No new collection within this region. No base of tongue lesion. No discrete tonsillar or peritonsillar abscess. Nasopharynx within normal limits. There is a persistent fairly sizable retropharyngeal effusion seen throughout the retropharyngeal space, measuring up to 2.5 cm in maximal AP diameter (series 17, image 54). Lateral extension into the carotid spaces bilaterally, with hazy stranding extending towards the lateral aspects of the neck. Overall effusion is perhaps slightly increased in size from previous. No frank rim enhancement or loculation since previous exam. Supraglottic airway mildly narrowed but remains patent,  relatively stable from previous. True cords within normal limits. Subglottic airway clear. Salivary glands: Salivary glands including the parotid and submandibular glands are within normal limits. Thyroid: Thyroid normal. Lymph nodes: Scattered bilateral level II/III lymph nodes again seen, measuring up to 14-15 mm bilaterally. Mildly prominent subcentimeter nodes seen within the right supraclavicular region as well. Few posterior chain nodes noted bilaterally, measuring up to 12 mm on the left (series 14, image 48). Overall, these are relatively similar to previous. Large nodal conglomerate within the right lower neck again seen, measuring 6.4 x 5.7 cm on today's exam. Right paratracheal node partially visualized, measuring 6.1 x 5.0 cm. Vascular: Normal intravascular enhancement seen throughout the neck. Limited intracranial: Unremarkable. Visualized orbits: Visualized globes and orbital soft tissues within normal limits. Mastoids and visualized paranasal sinuses: Small left maxillary and sphenoid sinus retention cyst noted. Mild scattered mucosal thickening noted within the ethmoidal air cells. Visualized paranasal sinuses are otherwise clear. Trace right mastoid effusion noted. Left mastoid air cells clear. Middle ear cavities well pneumatized and clear bilaterally. Skeleton: No acute osseous finding. No discrete lytic or blastic osseous lesions. Upper chest: Better evaluated on concomitant CT of the chest. Other: None. IMPRESSION: 1. Interval extraction of multiple right maxillary teeth. Residual inflammatory stranding with tiny foci of gas overlying the adjacent right maxilla consistent with postsurgical changes and/or residual infection. No residual abscess or drainable fluid collection identified. Hazy inflammatory stranding within the anterior neck inferiorly compatible with associated cellulitis, progressed relative to 03/05/2019. 2. Persistent retropharyngeal effusion with mild mass effect, slightly  increased in size from previous. No new rim enhancement or loculation since previous exam. Supraglottic airway is mildly narrowed but remains patent at this time. 3. Relatively similar cervical and mediastinal lymphadenopathy including large conglomerate nodal masses in the right lower neck and superior mediastinum, again concerning for metastatic disease or lymphoproliferative disorder. Electronically Signed   By: Jeannine Boga M.D.   On: 03/11/2019 18:52   Ct Soft Tissue Neck W Contrast  Result Date: 03/05/2019 CLINICAL DATA:  Worsening facial and neck swelling with difficulty swallowing. EXAM: CT NECK WITH CONTRAST TECHNIQUE: Multidetector CT imaging of the neck was performed using the standard protocol following the bolus administration of intravenous contrast. CONTRAST:  86m OMNIPAQUE IOHEXOL 300 MG/ML  SOLN COMPARISON:  03/04/2019 FINDINGS: Pharynx and larynx: Symmetric pharyngeal soft tissues without evidence of mass. Diffuse retropharyngeal fluid has increased and now measures up to 2.4 cm in AP thickness at the C3 level with mild mass effect on the posterior oropharynx. No rim enhancement of this fluid. No peritonsillar abscess. Patent airway. Unremarkable larynx. Salivary glands: No inflammation, mass, or stone. Thyroid: Unremarkable. Lymph nodes: Unchanged bilateral cervical lymphadenopathy including level  IIa lymph nodes measuring up to 16 mm in short axis on the right and 14 mm on the left as well as a 6.1 x 4.8 cm nodal conglomerate on the right level IV. Similar appearance of partially visualized mediastinal lymphadenopathy including a 6.4 x 4.1 cm right paratracheal mass. Vascular: Major vascular structures in the neck are grossly patent although the right internal jugular vein appears compressed in the lower neck by the nodal mass and is poorly visualized near the confluence with the subclavian vein, partly due to patient body habitus. Limited intracranial: Unremarkable. Visualized  orbits: Not imaged. Mastoids and visualized paranasal sinuses: Small left maxillary sinus mucous retention cyst. Clear mastoid air cells. Skeleton: Mild lower cervical spondylosis at C6-7. Advanced facet arthrosis in the included thoracic spine. No suspicious osseous lesion. Upper chest: Clear lung apices. Other: Multiple dental caries and periapical lucencies including a prominent periapical lucency involving the right lateral maxillary incisor with disruption of the buccal cortex of the alveolar ridge with an unchanged overlying 8 x 6 mm fluid collection and similar appearance of overlying soft tissue inflammation. Fat stranding more inferiorly in the lower face, right greater than left with asymmetric thickening of the platysma on the right, is stable to slightly increased. IMPRESSION: 1. Odontogenic infection with unchanged 8 x 6 mm subperiosteal abscess associated with the right maxillary incisor. 2. Increased retropharyngeal fluid with mild mass effect. While there is no rim enhancement and a sterile effusion is possible, retropharyngeal abscess is a concern given its size and progression since yesterday. 3. Mildly increased inflammatory stranding/swelling in the lower face and neck without new fluid collection. 4. Unchanged cervical and mediastinal lymphadenopathy including large conglomerate nodal masses in the right lower neck and superior mediastinum concerning for metastatic disease or lymphoproliferative disease. Electronically Signed   By: Logan Bores M.D.   On: 03/05/2019 16:15   Ct Soft Tissue Neck W Contrast  Result Date: 03/04/2019 CLINICAL DATA:  Initial evaluation for acute anterior neck swelling, neck mass. Sore throat. EXAM: CT NECK WITH CONTRAST TECHNIQUE: Multidetector CT imaging of the neck was performed using the standard protocol following the bolus administration of intravenous contrast. CONTRAST:  15m OMNIPAQUE IOHEXOL 300 MG/ML  SOLN COMPARISON:  None available. FINDINGS: Pharynx  and larynx: Oral cavity within normal limits without discrete mass or loculated fluid collection. Prominent periapical lucency with dehiscence of the overlying alveolar ridge present at the right maxillary lateral incisor. Overlying soft tissue swelling with inflammatory stranding concerning for odontogenic infection. Ill-defined hypodense collection within this region measuring 8 x 12 x 11 mm suspicious for early/developing odontogenic abscess (series 2, image 7). Additional hazy inflammatory stranding seen involving the subcutaneous fat inferiorly within the anterior neck, just inferior to the mandible (series 2, image 53). Associated thickening of the platysmas, right greater than left. Findings suspicious for additional site of infection/cellulitis. No base of tongue lesion. Palatine tonsils symmetric and within normal limits. Nasopharynx within normal limits. Moderate retropharyngeal effusion seen diffusely throughout the retropharyngeal soft tissues. No loculated collection or retropharyngeal abscess. Lateral extension into the carotid spaces bilaterally. Mild mass effect on the hypopharynx anteriorly. Supraglottic airway remains patent at this time. No significant associated mucosal edema. Epiglottis normal. Vallecula clear. Remainder of the hypopharynx and supraglottic larynx within normal limits. True cords symmetric and grossly normal. Subglottic airway clear. Salivary glands: Salivary glands including the parotid and submandibular glands are normal. Thyroid: Thyroid within normal limits. Lymph nodes: Increased number of lymph nodes seen within the neck bilaterally. Superimposed  enlarged bilateral level II lymph nodes measure up to 16 mm on the right and 13 mm on the left. Level III nodes measure up to 11 mm bilaterally. There is an enlarged soft tissue mass at right level IV measuring 6.2 x 4.7 x 4.6 cm, likely a nodal conglomerate (series 2, image 84). An enlarged nodal conglomerate within the right  paratracheal region measures approximately 6.0 x 3.6 x 6.5 cm (series 2, image 117). Few additional scattered enlarged mediastinal lymph nodes noted. Few mildly prominent posterior chain nodes measure up to 9 mm on the right. Vascular: Normal intravascular enhancement seen throughout the neck. Limited intracranial: Unremarkable. Visualized orbits: Not included on this exam. Mastoids and visualized paranasal sinuses: Visualized paranasal sinuses are clear. Mastoid air cells and middle ear cavities are largely well pneumatized and free of fluid. Skeleton: No acute osseous finding. No discrete lytic or blastic osseous lesions. Mild cervical spondylolysis noted at C6-7. Upper chest: Remainder of the partially visualized upper chest demonstrates no other acute finding. Partially visualized lungs are clear. Other: None. IMPRESSION: 1. Hazy inflammatory stranding with swelling involving the subcutaneous fat of the lower anterior neck, just inferior to the mandible, suspicious for possible infection/cellulitis. Moderate diffuse retropharyngeal effusion suspected to be reactive. No discrete retropharyngeal abscess. 2. Prominent periapical lucency about the right maxillary lateral incisor with adjacent inflammatory stranding, concerning for acute odontogenic infection. Superimposed 8 x 12 x 11 mm hypodense collection within this region compatible with early odontogenic abscess. 3. Enlarged nodal conglomerates within the right lower neck and right mediastinum measuring up to 6 cm in diameter. Given size, these are felt to be unlikely be reactive in nature, and are most concerning for possible concomitant lymphoproliferative disorder or nodal metastases. No primary mass lesion seen within the neck. Correlation with histology may be helpful for further evaluation. Additional prominent bilateral cervical lymph nodes as above are more indeterminate, and could be reactive. Electronically Signed   By: Jeannine Boga M.D.   On:  03/04/2019 19:57   Ct Angio Chest Pe W And/or Wo Contrast  Result Date: 03/11/2019 CLINICAL DATA:  Peritonsillar abscess, neck, right chest, and right breast swelling, neck pain EXAM: CT ANGIOGRAPHY CHEST WITH CONTRAST TECHNIQUE: Multidetector CT imaging of the chest was performed using the standard protocol during bolus administration of intravenous contrast. Multiplanar CT image reconstructions and MIPs were obtained to evaluate the vascular anatomy. CONTRAST:  166m OMNIPAQUE IOHEXOL 350 MG/ML SOLN COMPARISON:  CT neck, 03/05/2019 FINDINGS: Cardiovascular: Examination for pulmonary embolism is very limited by body habitus and poor contrast bolus, main pulmonary artery HU = 151. Within this limitation, there is no obvious central pulmonary embolism. Evaluation of the lobar and more distal pulmonary arteries is nondiagnostic. There is bulky lymphadenopathy which appears to obstruct the right brachiocephalic vein and superior vena cava. Normal heart size. No pericardial effusion. Mediastinum/Nodes: Redemonstrated, very bulky lower right cervical and right superior mediastinal lymphadenopathy, largest nodes measuring at least 5.8 cm (series 6, image 27). Thyroid gland, trachea, and esophagus demonstrate no significant findings. Lungs/Pleura: Probable right basilar atelectasis. No pleural effusion or pneumothorax. Upper Abdomen: No acute abnormality. Bilateral adrenal nodules measuring approximately 2.1 cm on the left and 1.3 cm on the right. Musculoskeletal: No chest wall abnormality. No acute or significant osseous findings. Review of the MIP images confirms the above findings. IMPRESSION: 1. Examination for pulmonary embolism is very limited by body habitus and poor contrast bolus, main pulmonary artery HU = 151. Within this limitation, there is no obvious  central pulmonary embolism. Evaluation of the lobar and more distal pulmonary arteries is nondiagnostic. 2. Redemonstrated, very bulky lower right cervical  and right superior mediastinal lymphadenopathy, largest nodes measuring at least 5.8 cm (series 6, image 27). This appears to obstruct the right brachiocephalic vein and superior vena cava. Findings are highly suspicious for malignancy, including lymphoma and metastatic disease. 3. Bilateral nonspecific soft tissue adrenal nodules, concerning for metastatic disease although incompletely characterized. 4. Probable right basilar atelectasis without definite acute airspace disease. Electronically Signed   By: Eddie Candle M.D.   On: 03/11/2019 18:34   Ct Abdomen Pelvis W Contrast  Result Date: 03/12/2019 CLINICAL DATA:  37 year old with bulky lymphadenopathy in the RIGHT LOWER neck and in the RIGHT superior mediastinum. Evaluate for abdominopelvic lymphadenopathy. EXAM: CT ABDOMEN AND PELVIS WITH CONTRAST TECHNIQUE: Multidetector CT imaging of the abdomen and pelvis was performed using the standard protocol following bolus administration of intravenous contrast. CONTRAST:  127m OMNIPAQUE IOHEXOL 300 MG/ML IV. Oral contrast was also administered. COMPARISON:  No prior abdominopelvic CT. CTA chest 03/11/2019 is correlated. FINDINGS: Lower chest: Dense consolidation dependently in the POSTERIOR RIGHT LOWER LOBE. Visualized lung bases otherwise clear. Normal heart size. Hepatobiliary: Borderline hepatomegaly. No focal hepatic parenchymal abnormality. Normal anatomic variant in that the LEFT lobe of the liver crosses the midline into the LEFT UPPER QUADRANT. Gallbladder normal in appearance without calcified gallstones. No biliary ductal dilation. Pancreas: Normal in appearance without evidence of mass, ductal dilation, or inflammation. Spleen: Normal in size and appearance. Adrenals/Urinary Tract: BILATERAL adrenal masses, that on the LEFT measuring approximately 1.6 x 2.5 x 2.2 cm and that on the RIGHT measuring approximately 1.3 x 2.1 x 2.7 cm. Kidneys normal in size and appearance without focal parenchymal  abnormality. No hydronephrosis. No evidence of urinary tract calculi. Normal appearing urinary bladder. Stomach/Bowel: Stomach normal in appearance for the degree of distention. Normal-appearing small bowel. Moderate stool burden throughout the normal appearing colon. Normal appendix in the RIGHT UPPER pelvis filled with oral contrast. Vascular/Lymphatic: Minimal atherosclerosis involving the RIGHT common iliac artery. No evidence of abdominal aortic atherosclerosis or aneurysm. Normal-appearing portal venous and systemic venous systems. 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 No pathologic lymphadenopathy within the abdomen or pelvis. Reproductive: Normal-appearing uterus and ovaries without evidence of adnexal mass. Other: Edema involving the LATERAL subcutaneous tissues of the LOWER chest and upper abdomen. Musculoskeletal: Degenerative disc disease and spondylosis involving the visualized thoracic spine. Chronic L5-S1 disc protrusion with calcification in the POSTERIOR annular fibers. No acute findings. IMPRESSION: 1. No acute abnormalities involving the abdomen or pelvis. 2. Borderline hepatomegaly without focal hepatic parenchymal abnormality. 3. Bilateral adrenal masses, measured above. Given the fact that the patient may have a malignancy, a dedicated CT of the abdomen without and with contrast (adrenal protocol) is recommended in further evaluation. This recommendation follows ACR consensus guidelines: Managing Incidental Findings on Abdominal CT: White Paper of the ACR Incidental Findings Committee. J Am Coll Radiol 2010;7:754-773. Electronically Signed   By: TEvangeline DakinM.D.   On: 03/12/2019 21:49   Ct Abdomen W Wo Contrast  Result Date: 03/16/2019 CLINICAL DATA:  Bilateral adrenal nodules. EXAM: CT ABDOMEN WITHOUT AND WITH CONTRAST TECHNIQUE: Multidetector CT imaging of the abdomen was performed  following the standard protocol before and following the bolus administration of intravenous contrast. CONTRAST:  1250mOMNIPAQUE IOHEXOL 300 MG/ML  SOLN COMPARISON:  03/12/2019 FINDINGS: Lower chest: Right  lower lobe collapse/consolidation. Hepatobiliary: Wedge-shaped area of altered perfusion noted in the lateral segment left liver along the falciform ligament. No suspicious focal abnormality within the liver parenchyma. Gallbladder decompressed. No intrahepatic or extrahepatic biliary dilation. Pancreas: No focal mass lesion. No dilatation of the main duct. No intraparenchymal cyst. No peripancreatic edema. Spleen: No splenomegaly. No focal mass lesion. Adrenals/Urinary Tract: 2.1 cm right adrenal nodule again noted. Absolute attenuation of this nodule is too high to allow classification as adenoma. Lesion shows no substantial enhancement after IV contrast administration and cannot be definitively characterized based on absolute washout or relative washout characteristics. 2.5 cm left adrenal nodule also has attenuation too high on precontrast imaging to allow characterization as an adenoma. Absolute washout and relative washout characteristics of this nodule or indeterminate. Kidneys unremarkable. Stomach/Bowel: Stomach is unremarkable. No gastric wall thickening. No evidence of outlet obstruction. Duodenum is normally positioned as is the ligament of Treitz. Small bowel loops and colonic segments of the abdomen are nondilated. Vascular/Lymphatic: No abdominal aortic aneurysm. There is no gastrohepatic or hepatoduodenal ligament lymphadenopathy. No intraperitoneal or retroperitoneal lymphadenopathy. Other: No intraperitoneal free fluid. Musculoskeletal: No worrisome lytic or sclerotic osseous abnormality. IMPRESSION: 1. Bilateral adrenal nodules as noted on recent CT scan. These nodules cannot be characterized by either absolute attenuation or washout characteristics. If these represent lipid poor adenomas, MRI  of the abdomen may be able to further characterize. Given the history of thoracic lymphadenopathy, close follow-up recommended. Electronically Signed   By: Misty Stanley M.D.   On: 03/16/2019 16:47   [place] Core Biopsy (lymph Nodes)  Result Date: 03/14/2019 INDICATION: 38 year old with cervical and mediastinal lymphadenopathy. Recent tooth extractions for dental abscess. Patient also has a retropharyngeal effusion. Plan for ultrasound-guided biopsy of the right cervical lymphadenopathy. EXAM: ULTRASOUND-GUIDED RIGHT SUPRACLAVICULAR LYMPH NODE BIOPSY MEDICATIONS: None. ANESTHESIA/SEDATION: Moderate (conscious) sedation was employed during this procedure. A total of Versed 2.0 mg and Fentanyl 100 mcg was administered intravenously. Moderate Sedation Time: 15 minutes the patient's level of consciousness and vital signs were monitored continuously by radiology nursing throughout the procedure under my direct supervision. FLUOROSCOPY TIME:  None COMPLICATIONS: None immediate. PROCEDURE: Informed written consent was obtained from the patient after a thorough discussion of the procedural risks, benefits and alternatives. A timeout was performed prior to the initiation of the procedure. Right side of the neck was prepped with chlorhexidine and sterile field was created. Skin was anesthetized with 1% lidocaine. Using ultrasound guidance, 18 gauge core needle was directed into the large nodal mass in the right supraclavicular area. A total of 6 core biopsies were obtained with an 18 gauge core device. Specimens placed in saline. Bandage placed over the puncture site. FINDINGS: Large lymph nodes on both sides of the neck. Large irregular nodal mass in the right supraclavicular area was targeted for biopsy. Needle position was confirmed within this nodal mass. No evidence for bleeding or hematoma formation at the end of the procedure. Visualized right internal jugular vein is small and does not completely compress. Evidence for  nonocclusive thrombus in the right internal jugular vein. Findings are similar to the recent CT. IMPRESSION: Ultrasound-guided core biopsies of the right supraclavicular nodal mass. Abnormal appearance of the right internal jugular vein. Findings are compatible with nonocclusive thrombus of unknown age. Findings are similar to the recent CT and could be secondary from the inflammatory changes in the neck and compression from the large supraclavicular nodal mass. Electronically Signed   By: Markus Daft M.D.   On:  03/14/2019 17:13   PATHOLOGY: Diagnosis Lymph node, needle/core biopsy, right supraclavicular - NON-SMALL CELL CARCINOMA - SEE COMMENT Microscopic Comment There is no evidence of lymphoid tissue. By immunohistochemistry, the neoplastic cells are positive for cytokeratin 7, cytokeratin 5/6, MOC-31 and p63 but negative for cytokeratin 20, mucicarmine, S100, Pax-8, and GATA-3. The overall features are consistent with squamous cell carcinoma. P16 is pending and will be reported separately. Dr. Jeannie Done reviewed the case and agrees with the above diagnosis. Thressa Sheller MD Pathologist, Electronic Signature (Case signed 03/17/2019)  ASSESSMENT: 38 y.o. 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 P16 is pending.  PLAN: Recommend referral to radiation oncology for radiation to her mediastinal lymphadenopathy.  Biopsy results returned late this afternoon after the patient was seen.  Will await P16 testing.  The patient will need a PET scan which can be performed as an outpatient to complete her staging work-up.  She may also need an MRI of the brain to evaluate for brain metastases.  I have talked with the patient regarding whether she was is to pursue treatment here in [place] closer to home in [place], [place].  She is unsure of this at this time.  We will readdress this with her once a definitive plan for treatment has been formulated.  The patient does not have advanced directives and I have referred her to the chaplain so they can provide this information to her.  Thank you for this consult.  We will follow along with you.  Mikey Bussing, NP 03/17/2019 12:01 PM    ADDENDUM: I met with the patient in her hospital room and discussed her situation in detail. She understands there are many different types of cancer and each is a separate disease.  What she has is a squamous cell type of cancer.  Squamous cells are scale like cells that the body uses wherever there is an exposure to the outside world, so that the skin mouth upper neck and airways vagina and uterine cervix and anus for example are all squamous cells.  In her case this is either going to be a primary head and neck squamous cell or primary lung squamous cell.  I tend to favor the former since we do not see an obvious primary lung mass.  P16 has been associated with many cases of HPV associated head and neck squamous cell cancers and if positive that also would point in the direction of a head and neck primary.  Note the patient does have a 25-pack-year smoking history.  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  She understands that she will also need other forms of treatment, including chemotherapy.  Because she lives in [place] her first choice for treatment would be the [facility], which is right in town and also where her own father was treated for myelodysplasia.  If they do not have a medical oncologist there she would prefer to have her chemotherapy in our [facility] clinic under Dr. Delton Coombes. I will set up an appointment for late next week at of of these clinics. That information should be available tomorrow.  The patient is  interested in completing a HCPOA document and I will consult chaplain in that regard.  I personally saw this patient and performed a substantive  portion of this encounter with the listed APP documented above.   Chauncey Cruel, MD Medical Oncology and Hematology [facility] [address] [place], [place] [address] Tel. [phone]    Fax. [phone]

## 2019-03-17 NOTE — Progress Notes (Signed)
PT Cancellation Note  Patient Details Name: Tara Aguilar MRN: [id number] DOB: October 31, 1980   Cancelled Treatment:    Reason Eval/Treat Not Completed: Other (comment). Per RN, pt emergently being transferred to [facility] hospital. Will continue to f/u with pt acutely as available and appropriate.    [place] 03/17/2019, 10:00 AM

## 2019-03-17 NOTE — Progress Notes (Addendum)
PROGRESS NOTE    Tara Aguilar  RAQ:[id number] DOB: 1981-01-05 DOA: 03/11/2019 PCP: Medicine, [facility]   Brief Narrative:  HPI On 03/11/2019 by Dr. Tennis Must Tara Aguilar 38 y.o.femalewith medical history significant ofdepression, glucose intolerance, class III obesity who was recently admitted on 05/09/2020and discharged 3 days later due to Chrsitopher Wik peritonsillar abscess.She was treated with clindamycin and analgesics. She underwent teeth extraction on Tuesday and has remained taking her oral clindamycin. However, the patient states that she has been progressively dyspneic in the past few days, particularly since yesterday. She has also developed swelling of the right neck, right upper chest and right breast area. She has been havingpain in the area. However she denies fever, chills, rhinorrhea but feels fatigue. She occasionally some mild productive sputum. She denies hemoptysis, wheezing or stridor she denies sick contacts or travel history.. However, she states that she has been having trouble lying down and gets orthopneic quickly. She denies chest pain, palpitations, diaphoresis, pitting edema of the lower extremities, but has felt lightheaded and has had orthopnea. Her appetite is decreased. She denies abdominal pain, nausea, emesis, diarrhea, constipation, melena or hematochezia. No dysuria, frequency or hematuria. She denies polyuria, polydipsia, polyphagia or blurred vision. She has been anxious and having some difficulty sleeping.  Interim history Admitted for dyspnea. Concern for lymphoma given CT results. IR consulted for biopsy. ENT consulted for retropharyngeal abscess.  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  Very bulky lower right cervical and right superior mediastinal lymphadenopathy largest measuring 5.8 cm.  Appears to obstruct of the right brachiocephalic vein and superior vena cava.  Suspicious for malignancy including lymphoma or metastatic disease. -Interventional radiology consulted and appreciated, s/p ultrasound-guided right supraclavicular lymph node biopsy - results pending as noted above -Discussed with oncology this morning, they will consult -Will consult radiation oncology as well - repeat CT abdomen done today due to bilateral adrenal nodules, but unable to be characterized (see report) - consider MRI -Patient was given 1 dose of dexamethasone 8 mg in the emergency department -Continue pain control -Complaining of dyspnea with minimal exertion.   -BNP 29 -Echocardiogram: EF 60-65% -CT abd/pelvis: no acute abnormalities in the abd or pelvis. B/L adrenal masses  Recent retropharyngeal/dental abscess -Status post dental extraction -Now POD 1 s/p I&D of retropharyngeal effusion (culture pending - NG x 1 day) -Upon last admission, ENT was consulted, patient was started on clindamycin - continue clinda for now -ENT, Dr. Benjamine Mola, consulted and appreciated -S/p incision and drainage of retropharyngeal effusion/abscess -Small amount of serosanguineous fluid was obtained and sent for aerobic and anaerobic cultures  Depression -Continue Wellbutrin  Glucose intolerance/prediabetes -A1c 6.2 -Patient did receive Decadron while in the emergency department -Continue to monitor CBGs, if needed will start insulin sliding scale  Morbid obesity -BMI  86.97 -Patient to discuss lifestyle modifications with PCP  Tobacco abuse -Discussed cessation -Declined nicotine patch  Undiagnosed sleep apnea -tells me she snores and  has never had Shervon Kerwin sleep study  Sinus pauses/Bradycardia -pt with bradycardia and pauses  -will obtain echocardiogram - EF 60-65% -potassium and magnesium WNL  Addendum: Called from nursing at [facility], concern for edema to upper extremities with weeping.  Will give dose of lasix x1, follow closely.   DVT prophylaxis: lovenox Code Status: full  Family Communication: none at bedside Disposition Plan: pending further w/u of lad   Consultants:   ENT  IR  Procedures: Echo 1. The left ventricle has normal systolic function, with an ejection fraction of 60-65%. The cavity size was normal. No evidence of left ventricular regional wall motion abnormalities.  Antimicrobials:  Anti-infectives (From admission, onward)   Start     Dose/Rate Route Frequency Ordered Stop   03/12/19 0200  clindamycin (CLEOCIN) IVPB 900 mg     900 mg 100 mL/hr over 30 Minutes Intravenous Every 8 hours 03/12/19 0105       Subjective: Notes swelling in upper extremities. SOB as well.   Objective: Vitals:   03/16/19 1835 03/16/19 2024 03/16/19 2356 03/17/19 0419  BP: 138/73 117/80 122/78 128/87  Pulse:   (!) 102 93  Resp: 18 18 14 12   Temp: 98.1 F (36.7 C) 98.2 F (36.8 C) 98.1 F (36.7 C) 97.6 F (36.4 C)  TempSrc: Oral Oral Oral Oral  SpO2: 100% 99% 95% 98%  Weight:      Height:        Intake/Output Summary (Last 24 hours) at 03/17/2019 0837 Last data filed at 03/17/2019 0340 Gross per 24 hour  Intake 100 ml  Output 850 ml  Net -750 ml   Filed Weights   03/11/19 1509 03/12/19 0030  Weight: 131.5 kg 132 kg    Examination:  General: No acute distress. Obese. Cardiovascular: Heart sounds show Demia Viera regular rate, and rhythm. Lungs: Clear to auscultation bilaterally Abdomen: Soft, nontender, nondistended Neurological:  Alert and oriented 3. Moves all extremities 4. Cranial nerves II through XII grossly intact. Skin: Warm and dry. No rashes or lesions. Extremities: upper extremity edema  Psychiatric: Mood and affect are normal. Insight and judgment are appropriate.   Data Reviewed: I have personally reviewed following labs and imaging studies  CBC: Recent Labs  Lab 03/11/19 1609 03/16/19 0809 03/17/19 0245  WBC 11.5* 13.4* 10.1  NEUTROABS 8.8*  --   --   HGB 13.1 13.5 12.7  HCT 41.4 41.7 39.4  MCV 89.2 86.7 87.9  PLT 250 293 625   Basic Metabolic Panel: Recent Labs  Lab 03/11/19 1609 03/11/19 1805 03/13/19 0354 03/14/19 0446 03/16/19 0809 03/17/19 0245  NA 139  --  137 135 138 138  K 3.9  --  3.7 4.0 4.2 4.1  CL 107  --  105 104 105 104  CO2 24  --  23 22 25 26   GLUCOSE 88  --  103* 96 124* 113*  BUN 16  --  17 18 12 16   CREATININE 1.12*  --  1.01* 1.03* 0.97 1.12*  CALCIUM 9.3  --  9.2 8.8* 9.5 9.4  MG  --  1.9 1.9  --  2.1 1.9  PHOS  --  3.7  --   --   --   --    GFR: Estimated Creatinine Clearance: 94.1 mL/min (Monish Haliburton) (by C-G formula based on SCr of 1.12 mg/dL (H)). Liver Function Tests: Recent Labs  Lab 03/11/19 1609  AST 25  ALT 45*  ALKPHOS 65  BILITOT 0.4  PROT 7.8  ALBUMIN 3.9  No results for input(s): LIPASE, AMYLASE in the last 168 hours. No results for input(s): AMMONIA in the last 168 hours. Coagulation Profile: Recent Labs  Lab 03/13/19 1420  INR 1.3*   Cardiac Enzymes: Recent Labs  Lab 03/11/19 1805  TROPONINI <0.03   BNP (last 3 results) No results for input(s): PROBNP in the last 8760 hours. HbA1C: No results for input(s): HGBA1C in the last 72 hours. CBG: Recent Labs  Lab 03/14/19 2325 03/15/19 1135 03/15/19 2102 03/16/19 0630 03/16/19 1444  GLUCAP 102* 114* 159* 166* 125*   Lipid Profile: No results for input(s): CHOL, HDL, LDLCALC, TRIG, CHOLHDL, LDLDIRECT in the last 72 hours. Thyroid Function Tests: No results for input(s): TSH,  T4TOTAL, FREET4, T3FREE, THYROIDAB in the last 72 hours. Anemia Panel: No results for input(s): VITAMINB12, FOLATE, FERRITIN, TIBC, IRON, RETICCTPCT in the last 72 hours. Sepsis Labs: No results for input(s): PROCALCITON, LATICACIDVEN in the last 168 hours.  Recent Results (from the past 240 hour(s))  SARS Coronavirus 2 (CEPHEID - Performed in [facility] hospital lab), Hosp Order     Status: None   Collection Time: 03/11/19  8:55 PM  Result Value Ref Range Status   SARS Coronavirus 2 NEGATIVE NEGATIVE Final    Comment: (NOTE) If result is NEGATIVE SARS-CoV-2 target nucleic acids are NOT DETECTED. The SARS-CoV-2 RNA is generally detectable in upper and lower  respiratory specimens during the acute phase of infection. The lowest  concentration of SARS-CoV-2 viral copies this assay can detect is 250  copies / mL. Oriel Rumbold negative result does not preclude SARS-CoV-2 infection  and should not be used as the sole basis for treatment or other  patient management decisions.  Nate Perri negative result may occur with  improper specimen collection / handling, submission of specimen other  than nasopharyngeal swab, presence of viral mutation(s) within the  areas targeted by this assay, and inadequate number of viral copies  (<250 copies / mL). Oniyah Rohe negative result must be combined with clinical  observations, patient history, and epidemiological information. If result is POSITIVE SARS-CoV-2 target nucleic acids are DETECTED. The SARS-CoV-2 RNA is generally detectable in upper and lower  respiratory specimens dur ing the acute phase of infection.  Positive  results are indicative of active infection with SARS-CoV-2.  Clinical  correlation with patient history and other diagnostic information is  necessary to determine patient infection status.  Positive results do  not rule out bacterial infection or co-infection with other viruses. If result is PRESUMPTIVE POSTIVE SARS-CoV-2 nucleic acids MAY BE PRESENT.   Skyelar Swigart  presumptive positive result was obtained on the submitted specimen  and confirmed on repeat testing.  While 2019 novel coronavirus  (SARS-CoV-2) nucleic acids may be present in the submitted sample  additional confirmatory testing may be necessary for epidemiological  and / or clinical management purposes  to differentiate between  SARS-CoV-2 and other Sarbecovirus currently known to infect humans.  If clinically indicated additional testing with an alternate test  methodology [phone]) is advised. The SARS-CoV-2 RNA is generally  detectable in upper and lower respiratory sp ecimens during the acute  phase of infection. The expected result is Negative. Fact Sheet for Patients:  StrictlyIdeas.no Fact Sheet for Healthcare Providers: BankingDealers.co.za This test is not yet approved or cleared by the [place] FDA and has been authorized for detection and/or diagnosis of SARS-CoV-2 by FDA under an Emergency Use Authorization (EUA).  This EUA will remain in effect (meaning this test can be used) for the duration  of the COVID-19 declaration under Section 564(b)(1) of the Act, 21 U.S.C. section 360bbb-3(b)(1), unless the authorization is terminated or revoked sooner. Performed at [facility], [address], [place], [place] [address]   Aerobic/Anaerobic Culture (surgical/deep wound)     Status: None (Preliminary result)   Collection Time: 03/15/19  8:15 AM  Result Value Ref Range Status   Specimen Description ABSCESS  Final   Special Requests RETROPHARYNGEAL  Final   Gram Stain NO WBC SEEN NO ORGANISMS SEEN   Final   Culture   Final    NO GROWTH 1 DAY Performed at [facility], [address], [place], [place] [address]    Report Status PENDING  Incomplete         Radiology Studies: Ct Abdomen W Wo Contrast  Result Date: 03/16/2019 CLINICAL DATA:  Bilateral adrenal nodules. EXAM: CT ABDOMEN WITHOUT AND WITH CONTRAST  TECHNIQUE: Multidetector CT imaging of the abdomen was performed following the standard protocol before and following the bolus administration of intravenous contrast. CONTRAST:  136mL OMNIPAQUE IOHEXOL 300 MG/ML  SOLN COMPARISON:  03/12/2019 FINDINGS: Lower chest: Right lower lobe collapse/consolidation. Hepatobiliary: Wedge-shaped area of altered perfusion noted in the lateral segment left liver along the falciform ligament. No suspicious focal abnormality within the liver parenchyma. Gallbladder decompressed. No intrahepatic or extrahepatic biliary dilation. Pancreas: No focal mass lesion. No dilatation of the main duct. No intraparenchymal cyst. No peripancreatic edema. Spleen: No splenomegaly. No focal mass lesion. Adrenals/Urinary Tract: 2.1 cm right adrenal nodule again noted. Absolute attenuation of this nodule is too high to allow classification as adenoma. Lesion shows no substantial enhancement after IV contrast administration and cannot be definitively characterized based on absolute washout or relative washout characteristics. 2.5 cm left adrenal nodule also has attenuation too high on precontrast imaging to allow characterization as an adenoma. Absolute washout and relative washout characteristics of this nodule or indeterminate. Kidneys unremarkable. Stomach/Bowel: Stomach is unremarkable. No gastric wall thickening. No evidence of outlet obstruction. Duodenum is normally positioned as is the ligament of Treitz. Small bowel loops and colonic segments of the abdomen are nondilated. Vascular/Lymphatic: No abdominal aortic aneurysm. There is no gastrohepatic or hepatoduodenal ligament lymphadenopathy. No intraperitoneal or retroperitoneal lymphadenopathy. Other: No intraperitoneal free fluid. Musculoskeletal: No worrisome lytic or sclerotic osseous abnormality. IMPRESSION: 1. Bilateral adrenal nodules as noted on recent CT scan. These nodules cannot be characterized by either absolute attenuation or  washout characteristics. If these represent lipid poor adenomas, MRI of the abdomen may be able to further characterize. Given the history of thoracic lymphadenopathy, close follow-up recommended. Electronically Signed   By: Misty Stanley M.D.   On: 03/16/2019 16:47        Scheduled Meds: . buPROPion  150 mg Oral Daily  . enoxaparin (LOVENOX) injection  40 mg Subcutaneous Q24H   Continuous Infusions: . clindamycin (CLEOCIN) IV Stopped (03/17/19 0240)     LOS: 6 days    Time spent: over 30 min    Fayrene Helper, MD [facility] Pager AMION  If 7PM-7AM, please contact night-coverage [email or web address] Password [facility] 03/17/2019, 8:37 AM

## 2019-03-17 NOTE — Consult Note (Addendum)
Radiation Oncology         [phone]) [phone] ________________________________  Name: Tara Aguilar        MRN: [id number]  Date of Service: 03/17/2019 DOB: 1981/04/10  EO:[id number], [facility]  No ref. provider found     REFERRING PHYSICIAN: Dr. 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 Diagnoses of History of retropharyngeal abscess, Mediastinal lymphadenopathy, and Mass of mediastinum were also pertinent to this visit.   HISTORY OF PRESENT ILLNESS: Tara Aguilar is a 38 y.o. female seen at the request of Dr. Florene Glen with [facility]. The patient was admitted to [facility] on 03/11/2019. She had been seen with her PCP for face and neck swelling and seen in the ED at [facility], and treated for an abscessed tooth with clindamycin and IV steroids. She was seen in the ED after symptoms worsened and a CT on 03/04/2019 revealed an odontogenic abscess with overlying cellulitis. She was given Augmentin and sent home. She was admitted on 03/05/2019 and repeat CT of the neck revealed a 2.4 cm retropharyngeal fluid collection at C3 level and mild mass effect on the posterior oropharynx. No peritonsilar abscess was noted. She had cervical adenopathy including a level IIa node measuring 1.6 cm on the right and 1.4 cm on the left. There was a right sided 6.1 x 4.8 cm nodal conglomerate at level IV. There was also partially visualized adenopathy measuring 6.4 x 4.1 cm in the right paratracheal region. She did not warrant urgent oral or ENT intervention, and discharged on 03/08/2019. She returned to the ED on 03/11/2019 complaining of shortness of breath.  CT angiography of the chest and of the neck on 03/11/2019 revealed No evidence of pulmonary embolism, but did see bulky lymphadenopathy obstructing the right brachiocephalic vein and superior vena cava, redemonstrating very bulky lower right cervical and right superior mediastinal adenopathy with the largest  nodes measuring at least 5.8 cm.  No lesion was specifically seen in the lung parenchyma.  She had bilateral adrenal nodules measuring 2.1 cm on the left and 1.3 cm on the right.  She did undergo interval extraction of multiple right maxillary teeth with residual inflammatory stranding and foci of gas overlying the right maxilla consistent with recent postsurgical change, no residual abscess or drainable fluid collection was noted, and hazy inflammatory stranding within the anterior neck inferiorly compatible with cellulitis was seen.  This was progressive compared to 03/05/2019 scan.  There was a persistent retropharyngeal effusion with mild mass-effect slightly increased in size from previous, her supraglottic airway was mildly narrowed but was patent.  Similar appearing cervical and mediastinal adenopathy was noted.  A CT of the abdomen and pelvis with contrast on 03/12/2019 revealed borderline hepatomegaly without focal parenchymal abnormality, normal anatomic variant but the left lobe of the liver crosses the midline, bilateral adrenal masses the left measuring 16 x 25 x 22 mm and the right 13 x 21 x 27 mm.  Atherosclerotic changes involving the right common iliac artery were noted, enlarged hemiazygous vein in the retroperitoneum was noted likely a collateral plaque pathway due to her SVC compression, no abnormalities were seen in the reproductive organs.  On 03/14/2019 she underwent biopsy of her right cervical adenopathy.  Final pathology is pending at this time, however Dr. 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  She confirmed that this is not a lymphoma, and should have final results in the next day  or 2.  The patient also underwent a CT abdomen yesterday with and without contrast to better characterize the adrenal area of concern.  There was a 2.1 cm right adrenal nodule absolute attenuation of the nodules too high to allow classification as adenoma, the  lesion shows no substantial enhancement after IV contrast administration and cannot be definitively characterized based on absolute washout.  2.5 cm nodule was noted on the left adrenal gland with attenuation too high on precontrast imaging to allow characterization his adenoma absolute washout and relative washout characteristics were indeterminant.  No other new findings were identified at the time. No other new findings were identified at the time she is seen today in the simulation department after we received a request to proceed with palliative radiotherapy    PREVIOUS RADIATION THERAPY: No   PAST MEDICAL HISTORY:  Past Medical History:  Diagnosis Date   Depression    Glucose intolerance 03/11/2019   Obesity, Class III, BMI 40-49.9 (morbid obesity) ([place]) 03/11/2019       PAST SURGICAL HISTORY: Past Surgical History:  Procedure Laterality Date   CESAREAN SECTION     IRRIGATION AND DEBRIDEMENT ABSCESS  03/15/2019   RETROPHARYNGEAL    TONSILLECTOMY AND ADENOIDECTOMY N/A 03/15/2019   Procedure: IRRIGATION AND DEBRIDEMENT OF RETROPHARYNGEAL ABSCESS;  Surgeon: Leta Baptist, MD;  Location: [facility] OR;  Service: ENT;  Laterality: N/A;     FAMILY HISTORY:  Family History  Problem Relation Age of Onset   Lung cancer Mother    Hypertension Mother    Leukemia Father    Myelodysplastic syndrome Father      SOCIAL HISTORY:  reports that she has been smoking cigarettes. She has a 13.00 pack-year smoking history. She has never used smokeless tobacco. She reports previous drug use. The patient is widowed and lives in [place]. Her husband passed away in 2018/11/04 due to an ampullary cancer. He was treated at [facility]. She has a 10 year old son who's staying with a family friend. She has a sister who is coming into town from [place] to help with her son in the next week. She does not have any other family nearby. 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   ALLERGIES: Patient has no known allergies.   MEDICATIONS:  Current Facility-Administered Medications  Medication Dose Route Frequency Provider Last Rate Last Dose   buPROPion (WELLBUTRIN XL) 24 hr tablet 150 mg  150 mg Oral Daily Elodia Florence., MD   150 mg at 03/17/19 1201   clindamycin (CLEOCIN) IVPB 900 mg  900 mg Intravenous Q8H Elodia Florence., MD 100 mL/hr at 03/17/19 1205 900 mg at 03/17/19 1205   enoxaparin (LOVENOX) injection 40 mg  40 mg Subcutaneous Q24H Elodia Florence., MD   40 mg at 03/16/19 2155   hydrOXYzine (ATARAX/VISTARIL) tablet 50 mg  50 mg Oral Q6H PRN Elodia Florence., MD   50 mg at 03/17/19 1331   ketorolac (TORADOL) 30 MG/ML injection 30 mg  30 mg Intravenous Q6H PRN Elodia Florence., MD   30 mg at 03/17/19 1331   ondansetron (ZOFRAN) tablet 4 mg  4 mg Oral Q6H PRN Elodia Florence., MD       Or   ondansetron [facility]) injection 4 mg  4 mg Intravenous Q6H PRN Elodia Florence., MD   4 mg at 03/15/19 8416     REVIEW OF SYSTEMS: On review  of systems, the patient reports that she is doing fair. She denies any shortness of breath unless she is ambulating or exerting herself. She reports edema of the neck and RUE over the last few weeks. She describes seeing veins of her chest wall she has previously never seen. She denies any unintended weight loss or history of HPV disease. She denies any current chest pain or shortness of breath. She does have odynophagia that is unchanged in the last few weeks. No other complaints are verbalized.      PHYSICAL EXAM:  Wt Readings from Last 3 Encounters:  03/12/19 291 lb (132 kg)  03/05/19 290 lb (131.5 kg)  03/04/19 290 lb (131.5 kg)   Temp Readings from Last 3 Encounters:  03/17/19 98.3 F (36.8 C) (Oral)  03/08/19 97.9 F (36.6 C) (Oral)  03/04/19 98.6 F (37 C) (Oral)   BP Readings from Last 3 Encounters:  03/17/19 139/65  03/08/19 117/77  03/04/19 126/80    Pulse Readings from Last 3 Encounters:  03/17/19 (!) 102  03/08/19 88  03/04/19 86   Pain Assessment Pain Score: 0-No pain/10 In general this is a well appearing, obese caucasian female in no acute distress. She's alert and oriented x4 and appropriate throughout the examination. Cardiopulmonary assessment is negative for acute distress and she exhibits normal effort. She has 2+ pitting edema of the RUE and collateral veins visible along the anterior chest wall.    ECOG = 2  0 - Asymptomatic (Fully active, able to carry on all predisease activities without restriction)  1 - Symptomatic but completely ambulatory (Restricted in physically strenuous activity but ambulatory and able to carry out work of a light or sedentary nature. For example, light housework, office work)  2 - Symptomatic, <50% in bed during the day (Ambulatory and capable of all self care but unable to carry out any work activities. Up and about more than 50% of waking hours)  3 - Symptomatic, >50% in bed, but not bedbound (Capable of only limited self-care, confined to bed or chair 50% or more of waking hours)  4 - Bedbound (Completely disabled. Cannot carry on any self-care. Totally confined to bed or chair)  5 - Death   Eustace Pen MM, Creech RH, Tormey DC, et al. [phone]). "Toxicity and response criteria of the [facility]". [place] Oncol. 5 (6): 649-55    LABORATORY DATA:  Lab Results  Component Value Date   WBC 10.1 03/17/2019   HGB 12.7 03/17/2019   HCT 39.4 03/17/2019   MCV 87.9 03/17/2019   PLT 276 03/17/2019   Lab Results  Component Value Date   NA 138 03/17/2019   K 4.1 03/17/2019   CL 104 03/17/2019   CO2 26 03/17/2019   Lab Results  Component Value Date   ALT 45 (H) 03/11/2019   AST 25 03/11/2019   ALKPHOS 65 03/11/2019   BILITOT 0.4 03/11/2019      RADIOGRAPHY: Dg Orthopantogram  Result Date: 03/07/2019 CLINICAL DATA:  Right-sided facial swelling. EXAM:  ORTHOPANTOGRAM/PANORAMIC COMPARISON:  None. FINDINGS: Advanced decay of the remaining right maxillary molar, including periapical lucency. Other residual dentition does not show visible decay or periodontal disease. IMPRESSION: Advanced decay and periapical lucency affecting the remaining right maxillary molar. Electronically Signed   By: Nelson Chimes M.D.   On: 03/07/2019 11:00   Dg Chest 2 View  Result Date: 03/05/2019 CLINICAL DATA:  Facial swelling and known peritonsillar abscess EXAM: CHEST - 2 VIEW COMPARISON:  None. FINDINGS: Cardiac shadows within normal limits. Lungs are well aerated bilaterally. Fullness in the right paratracheal region is noted consistent with the lymphadenopathy seen on recent CT of the neck. No focal infiltrate or effusion is seen. No acute bony abnormality is noted. IMPRESSION: Right paratracheal lymphadenopathy similar to that seen on recent CT examination. No other focal abnormality is noted. Electronically Signed   By: Inez Catalina M.D.   On: 03/05/2019 15:58   Ct Soft Tissue Neck W Contrast  Result Date: 03/11/2019 CLINICAL DATA:  Initial evaluation for neck swelling, recent surgery for odontogenic abscess. EXAM: CT NECK WITH CONTRAST TECHNIQUE: Multidetector CT imaging of the neck was performed using the standard protocol following the bolus administration of intravenous contrast. CONTRAST:  12mL OMNIPAQUE IOHEXOL 350 MG/ML SOLN COMPARISON:  Multiple previous CTs from 03/05/2019 and 03/04/2019. FINDINGS: Pharynx and larynx: Oral cavity within normal limits without mass lesion or collection. Since previous exam, there has been interval extraction of the right maxillary central and lateral incisors as well as the adjacent bicuspid. Residual inflammatory stranding overlies the adjacent maxilla with few small foci of gas, likely postsurgical in nature. No residual abscess now seen. Persistent hazy inflammatory stranding inferiorly along the anterior neck, right slightly worse  than left, but definitely progressed as compared to previous exam. Associated thickening of the latissimus musculature bilaterally. No new collection within this region. No base of tongue lesion. No discrete tonsillar or peritonsillar abscess. Nasopharynx within normal limits. There is a persistent fairly sizable retropharyngeal effusion seen throughout the retropharyngeal space, measuring up to 2.5 cm in maximal AP diameter (series 17, image 54). Lateral extension into the carotid spaces bilaterally, with hazy stranding extending towards the lateral aspects of the neck. Overall effusion is perhaps slightly increased in size from previous. No frank rim enhancement or loculation since previous exam. Supraglottic airway mildly narrowed but remains patent, relatively stable from previous. True cords within normal limits. Subglottic airway clear. Salivary glands: Salivary glands including the parotid and submandibular glands are within normal limits. Thyroid: Thyroid normal. Lymph nodes: Scattered bilateral level II/III lymph nodes again seen, measuring up to 14-15 mm bilaterally. Mildly prominent subcentimeter nodes seen within the right supraclavicular region as well. Few posterior chain nodes noted bilaterally, measuring up to 12 mm on the left (series 14, image 48). Overall, these are relatively similar to previous. Large nodal conglomerate within the right lower neck again seen, measuring 6.4 x 5.7 cm on today's exam. Right paratracheal node partially visualized, measuring 6.1 x 5.0 cm. Vascular: Normal intravascular enhancement seen throughout the neck. Limited intracranial: Unremarkable. Visualized orbits: Visualized globes and orbital soft tissues within normal limits. Mastoids and visualized paranasal sinuses: Small left maxillary and sphenoid sinus retention cyst noted. Mild scattered mucosal thickening noted within the ethmoidal air cells. Visualized paranasal sinuses are otherwise clear. Trace right mastoid  effusion noted. Left mastoid air cells clear. Middle ear cavities well pneumatized and clear bilaterally. Skeleton: No acute osseous finding. No discrete lytic or blastic osseous lesions. Upper chest: Better evaluated on concomitant CT of the chest. Other: None. IMPRESSION: 1. Interval extraction of multiple right maxillary teeth. Residual inflammatory stranding with tiny foci of gas overlying the adjacent right maxilla consistent with postsurgical changes and/or residual infection. No residual abscess or drainable fluid collection identified. Hazy inflammatory stranding within the anterior neck inferiorly compatible with associated cellulitis, progressed relative to 03/05/2019. 2. Persistent retropharyngeal effusion with mild mass effect, slightly increased in size from previous. No new rim enhancement or loculation  since previous exam. Supraglottic airway is mildly narrowed but remains patent at this time. 3. Relatively similar cervical and mediastinal lymphadenopathy including large conglomerate nodal masses in the right lower neck and superior mediastinum, again concerning for metastatic disease or lymphoproliferative disorder. Electronically Signed   By: Jeannine Boga M.D.   On: 03/11/2019 18:52   Ct Soft Tissue Neck W Contrast  Result Date: 03/05/2019 CLINICAL DATA:  Worsening facial and neck swelling with difficulty swallowing. EXAM: CT NECK WITH CONTRAST TECHNIQUE: Multidetector CT imaging of the neck was performed using the standard protocol following the bolus administration of intravenous contrast. CONTRAST:  72mL OMNIPAQUE IOHEXOL 300 MG/ML  SOLN COMPARISON:  03/04/2019 FINDINGS: Pharynx and larynx: Symmetric pharyngeal soft tissues without evidence of mass. Diffuse retropharyngeal fluid has increased and now measures up to 2.4 cm in AP thickness at the C3 level with mild mass effect on the posterior oropharynx. No rim enhancement of this fluid. No peritonsillar abscess. Patent airway.  Unremarkable larynx. Salivary glands: No inflammation, mass, or stone. Thyroid: Unremarkable. Lymph nodes: Unchanged bilateral cervical lymphadenopathy including level IIa lymph nodes measuring up to 16 mm in short axis on the right and 14 mm on the left as well as a 6.1 x 4.8 cm nodal conglomerate on the right level IV. Similar appearance of partially visualized mediastinal lymphadenopathy including a 6.4 x 4.1 cm right paratracheal mass. Vascular: Major vascular structures in the neck are grossly patent although the right internal jugular vein appears compressed in the lower neck by the nodal mass and is poorly visualized near the confluence with the subclavian vein, partly due to patient body habitus. Limited intracranial: Unremarkable. Visualized orbits: Not imaged. Mastoids and visualized paranasal sinuses: Small left maxillary sinus mucous retention cyst. Clear mastoid air cells. Skeleton: Mild lower cervical spondylosis at C6-7. Advanced facet arthrosis in the included thoracic spine. No suspicious osseous lesion. Upper chest: Clear lung apices. Other: Multiple dental caries and periapical lucencies including a prominent periapical lucency involving the right lateral maxillary incisor with disruption of the buccal cortex of the alveolar ridge with an unchanged overlying 8 x 6 mm fluid collection and similar appearance of overlying soft tissue inflammation. Fat stranding more inferiorly in the lower face, right greater than left with asymmetric thickening of the platysma on the right, is stable to slightly increased. IMPRESSION: 1. Odontogenic infection with unchanged 8 x 6 mm subperiosteal abscess associated with the right maxillary incisor. 2. Increased retropharyngeal fluid with mild mass effect. While there is no rim enhancement and a sterile effusion is possible, retropharyngeal abscess is a concern given its size and progression since yesterday. 3. Mildly increased inflammatory stranding/swelling in the  lower face and neck without new fluid collection. 4. Unchanged cervical and mediastinal lymphadenopathy including large conglomerate nodal masses in the right lower neck and superior mediastinum concerning for metastatic disease or lymphoproliferative disease. Electronically Signed   By: Logan Bores M.D.   On: 03/05/2019 16:15   Ct Soft Tissue Neck W Contrast  Result Date: 03/04/2019 CLINICAL DATA:  Initial evaluation for acute anterior neck swelling, neck mass. Sore throat. EXAM: CT NECK WITH CONTRAST TECHNIQUE: Multidetector CT imaging of the neck was performed using the standard protocol following the bolus administration of intravenous contrast. CONTRAST:  62mL OMNIPAQUE IOHEXOL 300 MG/ML  SOLN COMPARISON:  None available. FINDINGS: Pharynx and larynx: Oral cavity within normal limits without discrete mass or loculated fluid collection. Prominent periapical lucency with dehiscence of the overlying alveolar ridge present at the right maxillary  lateral incisor. Overlying soft tissue swelling with inflammatory stranding concerning for odontogenic infection. Ill-defined hypodense collection within this region measuring 8 x 12 x 11 mm suspicious for early/developing odontogenic abscess (series 2, image 7). Additional hazy inflammatory stranding seen involving the subcutaneous fat inferiorly within the anterior neck, just inferior to the mandible (series 2, image 53). Associated thickening of the platysmas, right greater than left. Findings suspicious for additional site of infection/cellulitis. No base of tongue lesion. Palatine tonsils symmetric and within normal limits. Nasopharynx within normal limits. Moderate retropharyngeal effusion seen diffusely throughout the retropharyngeal soft tissues. No loculated collection or retropharyngeal abscess. Lateral extension into the carotid spaces bilaterally. Mild mass effect on the hypopharynx anteriorly. Supraglottic airway remains patent at this time. No significant  associated mucosal edema. Epiglottis normal. Vallecula clear. Remainder of the hypopharynx and supraglottic larynx within normal limits. True cords symmetric and grossly normal. Subglottic airway clear. Salivary glands: Salivary glands including the parotid and submandibular glands are normal. Thyroid: Thyroid within normal limits. Lymph nodes: Increased number of lymph nodes seen within the neck bilaterally. Superimposed enlarged bilateral level II lymph nodes measure up to 16 mm on the right and 13 mm on the left. Level III nodes measure up to 11 mm bilaterally. There is an enlarged soft tissue mass at right level IV measuring 6.2 x 4.7 x 4.6 cm, likely a nodal conglomerate (series 2, image 84). An enlarged nodal conglomerate within the right paratracheal region measures approximately 6.0 x 3.6 x 6.5 cm (series 2, image 117). Few additional scattered enlarged mediastinal lymph nodes noted. Few mildly prominent posterior chain nodes measure up to 9 mm on the right. Vascular: Normal intravascular enhancement seen throughout the neck. Limited intracranial: Unremarkable. Visualized orbits: Not included on this exam. Mastoids and visualized paranasal sinuses: Visualized paranasal sinuses are clear. Mastoid air cells and middle ear cavities are largely well pneumatized and free of fluid. Skeleton: No acute osseous finding. No discrete lytic or blastic osseous lesions. Mild cervical spondylolysis noted at C6-7. Upper chest: Remainder of the partially visualized upper chest demonstrates no other acute finding. Partially visualized lungs are clear. Other: None. IMPRESSION: 1. Hazy inflammatory stranding with swelling involving the subcutaneous fat of the lower anterior neck, just inferior to the mandible, suspicious for possible infection/cellulitis. Moderate diffuse retropharyngeal effusion suspected to be reactive. No discrete retropharyngeal abscess. 2. Prominent periapical lucency about the right maxillary lateral  incisor with adjacent inflammatory stranding, concerning for acute odontogenic infection. Superimposed 8 x 12 x 11 mm hypodense collection within this region compatible with early odontogenic abscess. 3. Enlarged nodal conglomerates within the right lower neck and right mediastinum measuring up to 6 cm in diameter. Given size, these are felt to be unlikely be reactive in nature, and are most concerning for possible concomitant lymphoproliferative disorder or nodal metastases. No primary mass lesion seen within the neck. Correlation with histology may be helpful for further evaluation. Additional prominent bilateral cervical lymph nodes as above are more indeterminate, and could be reactive. Electronically Signed   By: Jeannine Boga M.D.   On: 03/04/2019 19:57   Ct Angio Chest Pe W And/or Wo Contrast  Result Date: 03/11/2019 CLINICAL DATA:  Peritonsillar abscess, neck, right chest, and right breast swelling, neck pain EXAM: CT ANGIOGRAPHY CHEST WITH CONTRAST TECHNIQUE: Multidetector CT imaging of the chest was performed using the standard protocol during bolus administration of intravenous contrast. Multiplanar CT image reconstructions and MIPs were obtained to evaluate the vascular anatomy. CONTRAST:  173mL OMNIPAQUE IOHEXOL  350 MG/ML SOLN COMPARISON:  CT neck, 03/05/2019 FINDINGS: Cardiovascular: Examination for pulmonary embolism is very limited by body habitus and poor contrast bolus, main pulmonary artery HU = 151. Within this limitation, there is no obvious central pulmonary embolism. Evaluation of the lobar and more distal pulmonary arteries is nondiagnostic. There is bulky lymphadenopathy which appears to obstruct the right brachiocephalic vein and superior vena cava. Normal heart size. No pericardial effusion. Mediastinum/Nodes: Redemonstrated, very bulky lower right cervical and right superior mediastinal lymphadenopathy, largest nodes measuring at least 5.8 cm (series 6, image 27). Thyroid  gland, trachea, and esophagus demonstrate no significant findings. Lungs/Pleura: Probable right basilar atelectasis. No pleural effusion or pneumothorax. Upper Abdomen: No acute abnormality. Bilateral adrenal nodules measuring approximately 2.1 cm on the left and 1.3 cm on the right. Musculoskeletal: No chest wall abnormality. No acute or significant osseous findings. Review of the MIP images confirms the above findings. IMPRESSION: 1. Examination for pulmonary embolism is very limited by body habitus and poor contrast bolus, main pulmonary artery HU = 151. Within this limitation, there is no obvious central pulmonary embolism. Evaluation of the lobar and more distal pulmonary arteries is nondiagnostic. 2. Redemonstrated, very bulky lower right cervical and right superior mediastinal lymphadenopathy, largest nodes measuring at least 5.8 cm (series 6, image 27). This appears to obstruct the right brachiocephalic vein and superior vena cava. Findings are highly suspicious for malignancy, including lymphoma and metastatic disease. 3. Bilateral nonspecific soft tissue adrenal nodules, concerning for metastatic disease although incompletely characterized. 4. Probable right basilar atelectasis without definite acute airspace disease. Electronically Signed   By: Eddie Candle M.D.   On: 03/11/2019 18:34   Ct Abdomen Pelvis W Contrast  Result Date: 03/12/2019 CLINICAL DATA:  38 year old with bulky lymphadenopathy in the RIGHT LOWER neck and in the RIGHT superior mediastinum. Evaluate for abdominopelvic lymphadenopathy. EXAM: CT ABDOMEN AND PELVIS WITH CONTRAST TECHNIQUE: Multidetector CT imaging of the abdomen and pelvis was performed using the standard protocol following bolus administration of intravenous contrast. CONTRAST:  173mL OMNIPAQUE IOHEXOL 300 MG/ML IV. Oral contrast was also administered. COMPARISON:  No prior abdominopelvic CT. CTA chest 03/11/2019 is correlated. FINDINGS: Lower chest: Dense consolidation  dependently in the POSTERIOR RIGHT LOWER LOBE. Visualized lung bases otherwise clear. Normal heart size. Hepatobiliary: Borderline hepatomegaly. No focal hepatic parenchymal abnormality. Normal anatomic variant in that the LEFT lobe of the liver crosses the midline into the LEFT UPPER QUADRANT. Gallbladder normal in appearance without calcified gallstones. No biliary ductal dilation. Pancreas: Normal in appearance without evidence of mass, ductal dilation, or inflammation. Spleen: Normal in size and appearance. Adrenals/Urinary Tract: BILATERAL adrenal masses, that on the LEFT measuring approximately 1.6 x 2.5 x 2.2 cm and that on the RIGHT measuring approximately 1.3 x 2.1 x 2.7 cm. Kidneys normal in size and appearance without focal parenchymal abnormality. No hydronephrosis. No evidence of urinary tract calculi. Normal appearing urinary bladder. Stomach/Bowel: Stomach normal in appearance for the degree of distention. Normal-appearing small bowel. Moderate stool burden throughout the normal appearing colon. Normal appendix in the RIGHT UPPER pelvis filled with oral contrast. Vascular/Lymphatic: Minimal atherosclerosis involving the RIGHT common iliac artery. No evidence of abdominal aortic atherosclerosis or aneurysm. Normal-appearing portal venous and systemic venous systems. 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 No pathologic lymphadenopathy within the abdomen or pelvis. Reproductive: Normal-appearing uterus and ovaries without evidence of adnexal mass. Other: Edema involving the LATERAL subcutaneous tissues  of the LOWER chest and upper abdomen. Musculoskeletal: Degenerative disc disease and spondylosis involving the visualized thoracic spine. Chronic L5-S1 disc protrusion with calcification in the POSTERIOR annular fibers. No acute findings. IMPRESSION: 1. No acute abnormalities involving the abdomen or pelvis. 2.  Borderline hepatomegaly without focal hepatic parenchymal abnormality. 3. Bilateral adrenal masses, measured above. Given the fact that the patient may have a malignancy, a dedicated CT of the abdomen without and with contrast (adrenal protocol) is recommended in further evaluation. This recommendation follows ACR consensus guidelines: Managing Incidental Findings on Abdominal CT: White Paper of the ACR Incidental Findings Committee. J Am Coll Radiol 2010;7:754-773. Electronically Signed   By: Evangeline Dakin M.D.   On: 03/12/2019 21:49   Ct Abdomen W Wo Contrast  Result Date: 03/16/2019 CLINICAL DATA:  Bilateral adrenal nodules. EXAM: CT ABDOMEN WITHOUT AND WITH CONTRAST TECHNIQUE: Multidetector CT imaging of the abdomen was performed following the standard protocol before and following the bolus administration of intravenous contrast. CONTRAST:  116mL OMNIPAQUE IOHEXOL 300 MG/ML  SOLN COMPARISON:  03/12/2019 FINDINGS: Lower chest: Right lower lobe collapse/consolidation. Hepatobiliary: Wedge-shaped area of altered perfusion noted in the lateral segment left liver along the falciform ligament. No suspicious focal abnormality within the liver parenchyma. Gallbladder decompressed. No intrahepatic or extrahepatic biliary dilation. Pancreas: No focal mass lesion. No dilatation of the main duct. No intraparenchymal cyst. No peripancreatic edema. Spleen: No splenomegaly. No focal mass lesion. Adrenals/Urinary Tract: 2.1 cm right adrenal nodule again noted. Absolute attenuation of this nodule is too high to allow classification as adenoma. Lesion shows no substantial enhancement after IV contrast administration and cannot be definitively characterized based on absolute washout or relative washout characteristics. 2.5 cm left adrenal nodule also has attenuation too high on precontrast imaging to allow characterization as an adenoma. Absolute washout and relative washout characteristics of this nodule or indeterminate.  Kidneys unremarkable. Stomach/Bowel: Stomach is unremarkable. No gastric wall thickening. No evidence of outlet obstruction. Duodenum is normally positioned as is the ligament of Treitz. Small bowel loops and colonic segments of the abdomen are nondilated. Vascular/Lymphatic: No abdominal aortic aneurysm. There is no gastrohepatic or hepatoduodenal ligament lymphadenopathy. No intraperitoneal or retroperitoneal lymphadenopathy. Other: No intraperitoneal free fluid. Musculoskeletal: No worrisome lytic or sclerotic osseous abnormality. IMPRESSION: 1. Bilateral adrenal nodules as noted on recent CT scan. These nodules cannot be characterized by either absolute attenuation or washout characteristics. If these represent lipid poor adenomas, MRI of the abdomen may be able to further characterize. Given the history of thoracic lymphadenopathy, close follow-up recommended. Electronically Signed   By: Misty Stanley M.D.   On: 03/16/2019 16:47   [place] Core Biopsy (lymph Nodes)  Result Date: 03/14/2019 INDICATION: 38 year old with cervical and mediastinal lymphadenopathy. Recent tooth extractions for dental abscess. Patient also has a retropharyngeal effusion. Plan for ultrasound-guided biopsy of the right cervical lymphadenopathy. EXAM: ULTRASOUND-GUIDED RIGHT SUPRACLAVICULAR LYMPH NODE BIOPSY MEDICATIONS: None. ANESTHESIA/SEDATION: Moderate (conscious) sedation was employed during this procedure. A total of Versed 2.0 mg and Fentanyl 100 mcg was administered intravenously. Moderate Sedation Time: 15 minutes the patient's level of consciousness and vital signs were monitored continuously by radiology nursing throughout the procedure under my direct supervision. FLUOROSCOPY TIME:  None COMPLICATIONS: None immediate. PROCEDURE: Informed written consent was obtained from the patient after a thorough discussion of the procedural risks, benefits and alternatives. A timeout was performed prior to the initiation of the procedure.  Right side of the neck was prepped with chlorhexidine and sterile field was created. Skin was  anesthetized with 1% lidocaine. Using ultrasound guidance, 18 gauge core needle was directed into the large nodal mass in the right supraclavicular area. A total of 6 core biopsies were obtained with an 18 gauge core device. Specimens placed in saline. Bandage placed over the puncture site. FINDINGS: Large lymph nodes on both sides of the neck. Large irregular nodal mass in the right supraclavicular area was targeted for biopsy. Needle position was confirmed within this nodal mass. No evidence for bleeding or hematoma formation at the end of the procedure. Visualized right internal jugular vein is small and does not completely compress. Evidence for nonocclusive thrombus in the right internal jugular vein. Findings are similar to the recent CT. IMPRESSION: Ultrasound-guided core biopsies of the right supraclavicular nodal mass. Abnormal appearance of the right internal jugular vein. Findings are compatible with nonocclusive thrombus of unknown age. Findings are similar to the recent CT and could be secondary from the inflammatory changes in the neck and compression from the large supraclavicular nodal mass. Electronically Signed   By: Markus Daft M.D.   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 Dr. Lisbeth Renshaw has reviewed her case and we've discussed her hospitalization with Dr. Florene Glen. I've also spoken with Dr. Melina Copa in pathology, and she does not have lymphoma. The features of her pathology are more consistent with a squamous cancer. We discussed the rationale for PET scan as an outpatient and that hopefully ICH on her pathology specimen will shed more light on the origin. With her SVC syndrome, she would benefit from urgent radiotherapy. I discussed with the patient the risks, benefits, short, and long term effects of treatment and that Dr. Lisbeth Renshaw recommends treating  the mediastinal and bulky right cervical nodal sites. We reviewed this is a palliative course of therapy, and that medical oncology would weigh in on the systemic recommendations of treatment when primary site is identified. She is in agreement. She will simulate today and begin treatment later this afternoon. Written consent is obtained and placed in the chart, a copy was provided to the patient. We will continue with treatment Friday, and then resume 03/22/2019 after the [facility] Day holiday. If her condition declines prior to Tuesday, please contact our service. Hopefully she will note some improvement soon from treatment. We also discussed that it would be helpful for her to remain inpatient until mid week next week to ensure she does not develop rebound edema that could compromise her airway. 2. Retropharyngeal abscess. It remains unclear if her cancer is a primary H&N cancer, but she is aware that if this is the case and additional radiation is indicated, she would meet with Dr. Isidore Moos for any discussion of definitive H&N radiotherapy. For now she will continue antibiotics, and we appreciate ENT's recommendations as well.  3. Code Status. The patient is counseled on the risks associated with her SVC syndrome. At this time she wishes to remain full code.   In a visit lasting 70 minutes, greater than 50% of the time was spent face to face discussing and in floor time coordinating the patient's care.     Carola Rhine, PAC

## 2019-03-17 NOTE — Progress Notes (Signed)
Order received to transfer patient to [facility], unit 5E.  Report called to receiving RN, fresh set of vitals obtained.  [facility] present to transport patient.

## 2019-03-17 NOTE — Progress Notes (Signed)
Received patient from [facility]. Assessment complete and agree with prior assessment. Vitals stable, will continue to monitor.

## 2019-03-18 ENCOUNTER — Ambulatory Visit
Admit: 2019-03-18 | Discharge: 2019-03-18 | Disposition: A | Discharge status: Home / Self Care | Payer: BC Managed Care – PPO | Attending: Radiation Oncology | Admitting: Radiation Oncology

## 2019-03-18 ENCOUNTER — Ambulatory Visit: Payer: BC Managed Care – PPO | Admitting: Radiation Oncology

## 2019-03-18 LAB — GLUCOSE, CAPILLARY
Glucose-Capillary: 101 mg/dL — ABNORMAL HIGH (ref 70–99)
Glucose-Capillary: 105 mg/dL — ABNORMAL HIGH (ref 70–99)
Glucose-Capillary: 106 mg/dL — ABNORMAL HIGH (ref 70–99)
Glucose-Capillary: 130 mg/dL — ABNORMAL HIGH (ref 70–99)

## 2019-03-18 LAB — CREATININE, SERUM
Creatinine, Ser: 1.04 mg/dL — ABNORMAL HIGH (ref 0.44–1.00)
GFR calc Af Amer: >60 mL/min (ref >60)
GFR calc non Af Amer: >60 mL/min (ref >60)

## 2019-03-18 MED ORDER — HYDROMORPHONE HCL 1 MG/ML IJ SOLN
0.5000 mg | Freq: Once | INTRAMUSCULAR | Status: AC
  Administered 2019-03-18: 01:00:00 0.5 mg via INTRAVENOUS

## 2019-03-18 MED ORDER — HYDROMORPHONE HCL 1 MG/ML IJ SOLN
INTRAMUSCULAR | Status: AC
  Administered 2019-03-18: 0.5 mg via INTRAVENOUS
  Filled 2019-03-18: qty 1

## 2019-03-18 MED ORDER — CLINDAMYCIN HCL 300 MG PO CAPS
300.0000 mg | ORAL_CAPSULE | Freq: Three times a day (TID) | ORAL | Status: DC
  Administered 2019-03-18 – 2019-03-21 (×9): 300 mg via ORAL
  Filled 2019-03-18 (×10): qty 1

## 2019-03-18 MED ORDER — METHYLPREDNISOLONE SODIUM SUCC 125 MG IJ SOLR
125.0000 mg | Freq: Once | INTRAMUSCULAR | Status: AC
  Administered 2019-03-18: 125 mg via INTRAVENOUS
  Filled 2019-03-18: qty 2

## 2019-03-18 MED ORDER — HYDROMORPHONE HCL 1 MG/ML IJ SOLN
0.5000 mg | Freq: Once | INTRAMUSCULAR | Status: AC
  Administered 2019-03-18: 0.5 mg via INTRAVENOUS
  Filled 2019-03-18: qty 0.5

## 2019-03-18 NOTE — Progress Notes (Signed)
PT SCREEN:  PT order received.  Pt is IND with bed mobility and ambulated 400' in hall unassisted.  Pt states she feels comfortable mobilizing on own and RN agreeable to pt ambulating in halls IND.  No PT needs at this time and PT service will sign off.  Debe Coder PT Acute Rehabilitation Services Pager [phone] Office [phone]

## 2019-03-18 NOTE — Progress Notes (Signed)
PROGRESS NOTE    Tara Aguilar  PZW:[id number] DOB: May 22, 1981 DOA: 03/11/2019 PCP: [facility]   Brief Narrative: Patient is a 38 year old female with history of depression, obesity who presented with complaints of progressive dyspnea, swelling of the neck, chest and bilateral upper extremities.  She was recently admitted on 03/05/2019 and was discharged 3 days later after managed for peritonsillar abscess.  She was taking clindamycin at home.  She was admitted for the evaluation of dyspnea.  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  There was concern for malignancy.  She was also found to have continued retropharyngeal causing dysphagia and also found to have significant cervical metastatic lymphadenopathy.  ENT was consulted and she underwent IND of the retropharyngeal effusion .  We are consulted and she underwent biopsy of her lymphadenopathy.  Radiation oncology, oncology consulted.  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  Now has severe edema on bilateral upper extremities.  Started on urgent radiation therapy. CTA chest showed very lower right cervical and right superior mediastinal lymphadenopathy largest measuring 5.8 cm. Appears to obstruct of the right brachiocephalic vein and superior vena cava.  Underwent lymph node biopsy by IR.  CT abdomen/pelvis also showed  bilateral adrenal nodules.Oncology following.  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  Oncology following.  Planning for PET scan as an outpatient.  Also planning for MRI of the brain to evaluate for brain metastasis.  Recent retropharyngeal/dental abscess: Recent history of dental extraction.  Continue to have retropharyngeal effusion so underwent I&D by ENT.  Currently on clindamycin.  Bilateral upper extremity edema: Mild improvement today.  This is from her subarachnoid syndrome.  She was given a dose of Lasix yesterday.  Depression: Continue Wellbutrin  Glucose intolerance.  Hemoglobin A1c of 6.2.  Morbid obesity: BMI of 46.9.  Tobacco abuse: Discussed cessation.  Declined nicotine patch.  Undiagnosed sleep apnea: Has never had a sleep study.  Recommend sleep study as an outpatient.          DVT prophylaxis:Lovenox Code Status: Full Family Communication: None present at the bedside Disposition Plan: Most likely she will stay for a few more days for radiation therapy.  Discharge planning after clearance from radiation oncology   Consultants: Oncology, radiation oncology, ENT  Procedures: I&D, lymph node biopsy  Antimicrobials:  Anti-infectives (From admission, onward)   Start     Dose/Rate Route Frequency Ordered Stop   03/12/19 0200  clindamycin (CLEOCIN) IVPB 900 mg     900 mg 100 mL/hr over 30 Minutes Intravenous Every 8 hours 03/12/19 0105        Subjective: Patient seen and examined the bedside this morning.  Hemodynamically stable.  Still has significant neck swelling, upper chest, upper back and bilateral upper extremity swelling.  Denies any throat pain.  Tolerating soft diet.  Objective: Vitals:  03/17/19 1009 03/17/19 1140 03/17/19 2010 03/18/19 0358  BP: 116/67 139/65 112/90 118/74  Pulse: 96 (!) 102 93 (!) 102  Resp: 14 16 16 16   Temp:  97.7 F (36.5 C) 98.3 F (36.8 C) 97.6 F (36.4 C) (!) 97.4 F (36.3 C)  TempSrc: Oral Oral    SpO2: 97% 95% 96% 95%  Weight:      Height:        Intake/Output Summary (Last 24 hours) at 03/18/2019 1139 Last data filed at 03/18/2019 0900 Gross per 24 hour  Intake 700 ml  Output 750 ml  Net -50 ml   Filed Weights   03/11/19 1509 03/12/19 0030  Weight: 131.5 kg 132 kg    Examination:  General exam: Not in distress, morbidly obese HEENT:PERRL,Oral mucosa moist, Ear/Nose normal on gross exam, neck edema Respiratory system: Bilateral equal air entry, normal vesicular breath sounds, no wheezes or crackles  Cardiovascular system: S1 & S2 heard, RRR. No JVD, murmurs, rubs, gallops or clicks. No pedal edema. Gastrointestinal system: Abdomen is nondistended, soft and nontender. No organomegaly or masses felt. Normal bowel sounds heard. Central nervous system: Alert and oriented. No focal neurological deficits. Extremities: No edema, no clubbing ,no cyanosis, distal peripheral pulses palpable. Edema of the upper chest, upper back, bilateral upper extremities. Skin: No rashes, lesions or ulcers,no icterus ,no pallor MSK: Normal muscle bulk,tone ,power Psychiatry: Judgement and insight appear normal. Mood & affect appropriate.     Data Reviewed: I have personally reviewed following labs and imaging studies  CBC: Recent Labs  Lab 03/11/19 1609 03/16/19 0809 03/17/19 0245  WBC 11.5* 13.4* 10.1  NEUTROABS 8.8*  --   --   HGB 13.1 13.5 12.7  HCT 41.4 41.7 39.4  MCV 89.2 86.7 87.9  PLT 250 293 465   Basic Metabolic Panel: Recent Labs  Lab 03/11/19 1609 03/11/19 1805 03/13/19 0354 03/14/19 0446 03/16/19 0809 03/17/19 0245 03/18/19 0634  NA 139  --  137 135 138 138  --   K 3.9  --  3.7 4.0 4.2 4.1  --   CL 107  --  105 104 105 104  --   CO2 24  --  23 22 25 26   --   GLUCOSE 88  --  103* 96 124* 113*  --   BUN 16  --  17 18 12 16   --   CREATININE 1.12*  --  1.01* 1.03*  0.97 1.12* 1.04*  CALCIUM 9.3  --  9.2 8.8* 9.5 9.4  --   MG  --  1.9 1.9  --  2.1 1.9  --   PHOS  --  3.7  --   --   --   --   --    GFR: Estimated Creatinine Clearance: 101.4 mL/min (A) (by C-G formula based on SCr of 1.04 mg/dL (H)). Liver Function Tests: Recent Labs  Lab 03/11/19 1609  AST 25  ALT 45*  ALKPHOS 65  BILITOT 0.4  PROT 7.8  ALBUMIN 3.9   No results for input(s): LIPASE, AMYLASE in the last 168 hours. No results for input(s): AMMONIA in the last 168 hours. Coagulation Profile: Recent Labs  Lab 03/13/19 1420  INR 1.3*   Cardiac Enzymes: Recent Labs  Lab 03/11/19 1805  TROPONINI <0.03   BNP (last 3 results) No results for input(s): PROBNP in the last 8760 hours. HbA1C: No results for input(s): HGBA1C in the last 72 hours. CBG: Recent Labs  Lab 03/16/19 1444 03/16/19 2355 03/17/19  1158 03/18/19 0012 03/18/19 0741  GLUCAP 125* 97 92 130* 101*   Lipid Profile: No results for input(s): CHOL, HDL, LDLCALC, TRIG, CHOLHDL, LDLDIRECT in the last 72 hours. Thyroid Function Tests: No results for input(s): TSH, T4TOTAL, FREET4, T3FREE, THYROIDAB in the last 72 hours. Anemia Panel: No results for input(s): VITAMINB12, FOLATE, FERRITIN, TIBC, IRON, RETICCTPCT in the last 72 hours. Sepsis Labs: No results for input(s): PROCALCITON, LATICACIDVEN in the last 168 hours.  Recent Results (from the past 240 hour(s))  SARS Coronavirus 2 (CEPHEID - Performed in [place] hospital lab), Hosp Order     Status: None   Collection Time: 03/11/19  8:55 PM  Result Value Ref Range Status   SARS Coronavirus 2 NEGATIVE NEGATIVE Final    Comment: (NOTE) If result is NEGATIVE SARS-CoV-2 target nucleic acids are NOT DETECTED. The SARS-CoV-2 RNA is generally detectable in upper and lower  respiratory specimens during the acute phase of infection. The lowest  concentration of SARS-CoV-2 viral copies this assay can detect is 250  copies / mL. A negative result does not  preclude SARS-CoV-2 infection  and should not be used as the sole basis for treatment or other  patient management decisions.  A negative result may occur with  improper specimen collection / handling, submission of specimen other  than nasopharyngeal swab, presence of viral mutation(s) within the  areas targeted by this assay, and inadequate number of viral copies  (<250 copies / mL). A negative result must be combined with clinical  observations, patient history, and epidemiological information. If result is POSITIVE SARS-CoV-2 target nucleic acids are DETECTED. The SARS-CoV-2 RNA is generally detectable in upper and lower  respiratory specimens dur ing the acute phase of infection.  Positive  results are indicative of active infection with SARS-CoV-2.  Clinical  correlation with patient history and other diagnostic information is  necessary to determine patient infection status.  Positive results do  not rule out bacterial infection or co-infection with other viruses. If result is PRESUMPTIVE POSTIVE SARS-CoV-2 nucleic acids MAY BE PRESENT.   A presumptive positive result was obtained on the submitted specimen  and confirmed on repeat testing.  While 2019 novel coronavirus  (SARS-CoV-2) nucleic acids may be present in the submitted sample  additional confirmatory testing may be necessary for epidemiological  and / or clinical management purposes  to differentiate between  SARS-CoV-2 and other Sarbecovirus currently known to infect humans.  If clinically indicated additional testing with an alternate test  methodology [phone]) is advised. The SARS-CoV-2 RNA is generally  detectable in upper and lower respiratory sp ecimens during the acute  phase of infection. The expected result is Negative. Fact Sheet for Patients:  StrictlyIdeas.no Fact Sheet for Healthcare Providers: BankingDealers.co.za This test is not yet approved or cleared by  the [place] FDA and has been authorized for detection and/or diagnosis of SARS-CoV-2 by FDA under an Emergency Use Authorization (EUA).  This EUA will remain in effect (meaning this test can be used) for the duration of the COVID-19 declaration under Section 564(b)(1) of the Act, 21 U.S.C. section 360bbb-3(b)(1), unless the authorization is terminated or revoked sooner. Performed at [facility], [address], [place], [place] [address]   Aerobic/Anaerobic Culture (surgical/deep wound)     Status: None (Preliminary result)   Collection Time: 03/15/19  8:15 AM  Result Value Ref Range Status   Specimen Description ABSCESS  Final   Special Requests RETROPHARYNGEAL  Final   Gram Stain   Final  NO WBC SEEN NO ORGANISMS SEEN Performed at [facility], [place] [address] [place], [place] [address]    Culture   Final    RARE Consistent with normal respiratory flora. NO ANAEROBES ISOLATED; CULTURE IN PROGRESS FOR 5 DAYS    Report Status PENDING  Incomplete         Radiology Studies: Ct Abdomen W Wo Contrast  Result Date: 03/16/2019 CLINICAL DATA:  Bilateral adrenal nodules. EXAM: CT ABDOMEN WITHOUT AND WITH CONTRAST TECHNIQUE: Multidetector CT imaging of the abdomen was performed following the standard protocol before and following the bolus administration of intravenous contrast. CONTRAST:  145mL OMNIPAQUE IOHEXOL 300 MG/ML  SOLN COMPARISON:  03/12/2019 FINDINGS: Lower chest: Right lower lobe collapse/consolidation. Hepatobiliary: Wedge-shaped area of altered perfusion noted in the lateral segment left liver along the falciform ligament. No suspicious focal abnormality within the liver parenchyma. Gallbladder decompressed. No intrahepatic or extrahepatic biliary dilation. Pancreas: No focal mass lesion. No dilatation of the main duct. No intraparenchymal cyst. No peripancreatic edema. Spleen: No splenomegaly. No focal mass lesion. Adrenals/Urinary Tract: 2.1 cm right adrenal  nodule again noted. Absolute attenuation of this nodule is too high to allow classification as adenoma. Lesion shows no substantial enhancement after IV contrast administration and cannot be definitively characterized based on absolute washout or relative washout characteristics. 2.5 cm left adrenal nodule also has attenuation too high on precontrast imaging to allow characterization as an adenoma. Absolute washout and relative washout characteristics of this nodule or indeterminate. Kidneys unremarkable. Stomach/Bowel: Stomach is unremarkable. No gastric wall thickening. No evidence of outlet obstruction. Duodenum is normally positioned as is the ligament of Treitz. Small bowel loops and colonic segments of the abdomen are nondilated. Vascular/Lymphatic: No abdominal aortic aneurysm. There is no gastrohepatic or hepatoduodenal ligament lymphadenopathy. No intraperitoneal or retroperitoneal lymphadenopathy. Other: No intraperitoneal free fluid. Musculoskeletal: No worrisome lytic or sclerotic osseous abnormality. IMPRESSION: 1. Bilateral adrenal nodules as noted on recent CT scan. These nodules cannot be characterized by either absolute attenuation or washout characteristics. If these represent lipid poor adenomas, MRI of the abdomen may be able to further characterize. Given the history of thoracic lymphadenopathy, close follow-up recommended. Electronically Signed   By: Misty Stanley M.D.   On: 03/16/2019 16:47        Scheduled Meds:  buPROPion  150 mg Oral Daily   enoxaparin (LOVENOX) injection  40 mg Subcutaneous Q24H   Continuous Infusions:  clindamycin (CLEOCIN) IV 900 mg (03/18/19 0358)     LOS: 7 days    Time spent: 35 mins.More than 50% of that time was spent in counseling and/or coordination of care.      Shelly Coss, MD [facility] Pager [phone]  If 7PM-7AM, please contact night-coverage [email or web address] Password [facility] 03/18/2019, 11:39 AM

## 2019-03-18 NOTE — Progress Notes (Signed)
HEMATOLOGY-ONCOLOGY PROGRESS NOTE  SUBJECTIVE: Still has facial and right arm swelling. Denies chest pain. Get SOB with exertion.  She has no other complaints today.  REVIEW OF SYSTEMS:   Constitutional: Denies fevers, chills or abnormal weight loss Eyes: Denies blurriness of vision Ears, nose, mouth, throat, and face: Denies mucositis or sore throat Respiratory: Reports shortness of breath with exertion. Cardiovascular: Denies palpitation, chest discomfort.  Has facial and right arm swelling. Gastrointestinal:  Denies nausea, heartburn or change in bowel habits Skin: Denies abnormal skin rashes Lymphatics: Denies new lymphadenopathy or easy bruising Neurological:Denies numbness, tingling or new weaknesses Behavioral/Psych: Mood is stable, no new changes  Extremities: No lower extremity edema All other systems were reviewed with the patient and are negative.  I have reviewed the past medical history, past surgical history, social history and family history with the patient and they are unchanged from previous note.   PHYSICAL EXAMINATION: ECOG PERFORMANCE STATUS: 1 - Symptomatic but completely ambulatory  Vitals:   03/17/19 2010 03/18/19 0358  BP: 112/90 118/74  Pulse: 93 (!) 102  Resp: 16 16  Temp: 97.6 F (36.4 C) (!) 97.4 F (36.3 C)  SpO2: 96% 95%   Filed Weights   03/11/19 1509 03/12/19 0030  Weight: 290 lb (131.5 kg) 291 lb (132 kg)    Intake/Output from previous day: 05/21 0701 - 05/22 0700 In: 40 [P.O.:460] Out: 750 [Urine:750]  GENERAL:alert, no distress and comfortable SKIN: skin color, texture, turgor are normal, no rashes or significant lesions HEAD/NECK: Ongoing facial and neck swelling.  No palpable adenopathy. EYES: normal, Conjunctiva are pink and non-injected, sclera clear OROPHARYNX:no exudate, no erythema and lips, buccal mucosa, and tongue normal  LUNGS: clear to auscultation and percussion with normal breathing effort HEART: regular rate &  rhythm and no murmurs and no lower extremity edema.  Has right arm swelling.  No swelling in the left arm. ABDOMEN:abdomen soft, non-tender and normal bowel sounds Musculoskeletal:no cyanosis of digits and no clubbing  NEURO: alert & oriented x 3 with fluent speech, no focal motor/sensory deficits  LABORATORY DATA:  I have reviewed the data as listed CMP Latest Ref Rng & Units 03/18/2019 03/17/2019 03/16/2019  Glucose 70 - 99 mg/dL - 113(H) 124(H)  BUN 6 - 20 mg/dL - 16 12  Creatinine 0.44 - 1.00 mg/dL 1.04(H) 1.12(H) 0.97  Sodium 135 - 145 mmol/L - 138 138  Potassium 3.5 - 5.1 mmol/L - 4.1 4.2  Chloride 98 - 111 mmol/L - 104 105  CO2 22 - 32 mmol/L - 26 25  Calcium 8.9 - 10.3 mg/dL - 9.4 9.5  Total Protein 6.5 - 8.1 g/dL - - -  Total Bilirubin 0.3 - 1.2 mg/dL - - -  Alkaline Phos 38 - 126 U/L - - -  AST 15 - 41 U/L - - -  ALT 0 - 44 U/L - - -    Lab Results  Component Value Date   WBC 10.1 03/17/2019   HGB 12.7 03/17/2019   HCT 39.4 03/17/2019   MCV 87.9 03/17/2019   PLT 276 03/17/2019   NEUTROABS 8.8 (H) 03/11/2019    Dg Orthopantogram  Result Date: 03/07/2019 CLINICAL DATA:  Right-sided facial swelling. EXAM: ORTHOPANTOGRAM/PANORAMIC COMPARISON:  None. FINDINGS: Advanced decay of the remaining right maxillary molar, including periapical lucency. Other residual dentition does not show visible decay or periodontal disease. IMPRESSION: Advanced decay and periapical lucency affecting the remaining right maxillary molar. Electronically Signed   By: Nelson Chimes M.D.   On:  03/07/2019 11:00   Dg Chest 2 View  Result Date: 03/05/2019 CLINICAL DATA:  Facial swelling and known peritonsillar abscess EXAM: CHEST - 2 VIEW COMPARISON:  None. FINDINGS: Cardiac shadows within normal limits. Lungs are well aerated bilaterally. Fullness in the right paratracheal region is noted consistent with the lymphadenopathy seen on recent CT of the neck. No focal infiltrate or effusion is seen. No acute  bony abnormality is noted. IMPRESSION: Right paratracheal lymphadenopathy similar to that seen on recent CT examination. No other focal abnormality is noted. Electronically Signed   By: Inez Catalina M.D.   On: 03/05/2019 15:58   Ct Soft Tissue Neck W Contrast  Result Date: 03/11/2019 CLINICAL DATA:  Initial evaluation for neck swelling, recent surgery for odontogenic abscess. EXAM: CT NECK WITH CONTRAST TECHNIQUE: Multidetector CT imaging of the neck was performed using the standard protocol following the bolus administration of intravenous contrast. CONTRAST:  119mL OMNIPAQUE IOHEXOL 350 MG/ML SOLN COMPARISON:  Multiple previous CTs from 03/05/2019 and 03/04/2019. FINDINGS: Pharynx and larynx: Oral cavity within normal limits without mass lesion or collection. Since previous exam, there has been interval extraction of the right maxillary central and lateral incisors as well as the adjacent bicuspid. Residual inflammatory stranding overlies the adjacent maxilla with few small foci of gas, likely postsurgical in nature. No residual abscess now seen. Persistent hazy inflammatory stranding inferiorly along the anterior neck, right slightly worse than left, but definitely progressed as compared to previous exam. Associated thickening of the latissimus musculature bilaterally. No new collection within this region. No base of tongue lesion. No discrete tonsillar or peritonsillar abscess. Nasopharynx within normal limits. There is a persistent fairly sizable retropharyngeal effusion seen throughout the retropharyngeal space, measuring up to 2.5 cm in maximal AP diameter (series 17, image 54). Lateral extension into the carotid spaces bilaterally, with hazy stranding extending towards the lateral aspects of the neck. Overall effusion is perhaps slightly increased in size from previous. No frank rim enhancement or loculation since previous exam. Supraglottic airway mildly narrowed but remains patent, relatively stable  from previous. True cords within normal limits. Subglottic airway clear. Salivary glands: Salivary glands including the parotid and submandibular glands are within normal limits. Thyroid: Thyroid normal. Lymph nodes: Scattered bilateral level II/III lymph nodes again seen, measuring up to 14-15 mm bilaterally. Mildly prominent subcentimeter nodes seen within the right supraclavicular region as well. Few posterior chain nodes noted bilaterally, measuring up to 12 mm on the left (series 14, image 48). Overall, these are relatively similar to previous. Large nodal conglomerate within the right lower neck again seen, measuring 6.4 x 5.7 cm on today's exam. Right paratracheal node partially visualized, measuring 6.1 x 5.0 cm. Vascular: Normal intravascular enhancement seen throughout the neck. Limited intracranial: Unremarkable. Visualized orbits: Visualized globes and orbital soft tissues within normal limits. Mastoids and visualized paranasal sinuses: Small left maxillary and sphenoid sinus retention cyst noted. Mild scattered mucosal thickening noted within the ethmoidal air cells. Visualized paranasal sinuses are otherwise clear. Trace right mastoid effusion noted. Left mastoid air cells clear. Middle ear cavities well pneumatized and clear bilaterally. Skeleton: No acute osseous finding. No discrete lytic or blastic osseous lesions. Upper chest: Better evaluated on concomitant CT of the chest. Other: None. IMPRESSION: 1. Interval extraction of multiple right maxillary teeth. Residual inflammatory stranding with tiny foci of gas overlying the adjacent right maxilla consistent with postsurgical changes and/or residual infection. No residual abscess or drainable fluid collection identified. Hazy inflammatory stranding within the anterior neck inferiorly  compatible with associated cellulitis, progressed relative to 03/05/2019. 2. Persistent retropharyngeal effusion with mild mass effect, slightly increased in size from  previous. No new rim enhancement or loculation since previous exam. Supraglottic airway is mildly narrowed but remains patent at this time. 3. Relatively similar cervical and mediastinal lymphadenopathy including large conglomerate nodal masses in the right lower neck and superior mediastinum, again concerning for metastatic disease or lymphoproliferative disorder. Electronically Signed   By: Jeannine Boga M.D.   On: 03/11/2019 18:52   Ct Soft Tissue Neck W Contrast  Result Date: 03/05/2019 CLINICAL DATA:  Worsening facial and neck swelling with difficulty swallowing. EXAM: CT NECK WITH CONTRAST TECHNIQUE: Multidetector CT imaging of the neck was performed using the standard protocol following the bolus administration of intravenous contrast. CONTRAST:  32mL OMNIPAQUE IOHEXOL 300 MG/ML  SOLN COMPARISON:  03/04/2019 FINDINGS: Pharynx and larynx: Symmetric pharyngeal soft tissues without evidence of mass. Diffuse retropharyngeal fluid has increased and now measures up to 2.4 cm in AP thickness at the C3 level with mild mass effect on the posterior oropharynx. No rim enhancement of this fluid. No peritonsillar abscess. Patent airway. Unremarkable larynx. Salivary glands: No inflammation, mass, or stone. Thyroid: Unremarkable. Lymph nodes: Unchanged bilateral cervical lymphadenopathy including level IIa lymph nodes measuring up to 16 mm in short axis on the right and 14 mm on the left as well as a 6.1 x 4.8 cm nodal conglomerate on the right level IV. Similar appearance of partially visualized mediastinal lymphadenopathy including a 6.4 x 4.1 cm right paratracheal mass. Vascular: Major vascular structures in the neck are grossly patent although the right internal jugular vein appears compressed in the lower neck by the nodal mass and is poorly visualized near the confluence with the subclavian vein, partly due to patient body habitus. Limited intracranial: Unremarkable. Visualized orbits: Not imaged. Mastoids  and visualized paranasal sinuses: Small left maxillary sinus mucous retention cyst. Clear mastoid air cells. Skeleton: Mild lower cervical spondylosis at C6-7. Advanced facet arthrosis in the included thoracic spine. No suspicious osseous lesion. Upper chest: Clear lung apices. Other: Multiple dental caries and periapical lucencies including a prominent periapical lucency involving the right lateral maxillary incisor with disruption of the buccal cortex of the alveolar ridge with an unchanged overlying 8 x 6 mm fluid collection and similar appearance of overlying soft tissue inflammation. Fat stranding more inferiorly in the lower face, right greater than left with asymmetric thickening of the platysma on the right, is stable to slightly increased. IMPRESSION: 1. Odontogenic infection with unchanged 8 x 6 mm subperiosteal abscess associated with the right maxillary incisor. 2. Increased retropharyngeal fluid with mild mass effect. While there is no rim enhancement and a sterile effusion is possible, retropharyngeal abscess is a concern given its size and progression since yesterday. 3. Mildly increased inflammatory stranding/swelling in the lower face and neck without new fluid collection. 4. Unchanged cervical and mediastinal lymphadenopathy including large conglomerate nodal masses in the right lower neck and superior mediastinum concerning for metastatic disease or lymphoproliferative disease. Electronically Signed   By: Logan Bores M.D.   On: 03/05/2019 16:15   Ct Soft Tissue Neck W Contrast  Result Date: 03/04/2019 CLINICAL DATA:  Initial evaluation for acute anterior neck swelling, neck mass. Sore throat. EXAM: CT NECK WITH CONTRAST TECHNIQUE: Multidetector CT imaging of the neck was performed using the standard protocol following the bolus administration of intravenous contrast. CONTRAST:  27mL OMNIPAQUE IOHEXOL 300 MG/ML  SOLN COMPARISON:  None available. FINDINGS: Pharynx and  larynx: Oral cavity within  normal limits without discrete mass or loculated fluid collection. Prominent periapical lucency with dehiscence of the overlying alveolar ridge present at the right maxillary lateral incisor. Overlying soft tissue swelling with inflammatory stranding concerning for odontogenic infection. Ill-defined hypodense collection within this region measuring 8 x 12 x 11 mm suspicious for early/developing odontogenic abscess (series 2, image 7). Additional hazy inflammatory stranding seen involving the subcutaneous fat inferiorly within the anterior neck, just inferior to the mandible (series 2, image 53). Associated thickening of the platysmas, right greater than left. Findings suspicious for additional site of infection/cellulitis. No base of tongue lesion. Palatine tonsils symmetric and within normal limits. Nasopharynx within normal limits. Moderate retropharyngeal effusion seen diffusely throughout the retropharyngeal soft tissues. No loculated collection or retropharyngeal abscess. Lateral extension into the carotid spaces bilaterally. Mild mass effect on the hypopharynx anteriorly. Supraglottic airway remains patent at this time. No significant associated mucosal edema. Epiglottis normal. Vallecula clear. Remainder of the hypopharynx and supraglottic larynx within normal limits. True cords symmetric and grossly normal. Subglottic airway clear. Salivary glands: Salivary glands including the parotid and submandibular glands are normal. Thyroid: Thyroid within normal limits. Lymph nodes: Increased number of lymph nodes seen within the neck bilaterally. Superimposed enlarged bilateral level II lymph nodes measure up to 16 mm on the right and 13 mm on the left. Level III nodes measure up to 11 mm bilaterally. There is an enlarged soft tissue mass at right level IV measuring 6.2 x 4.7 x 4.6 cm, likely a nodal conglomerate (series 2, image 84). An enlarged nodal conglomerate within the right paratracheal region measures  approximately 6.0 x 3.6 x 6.5 cm (series 2, image 117). Few additional scattered enlarged mediastinal lymph nodes noted. Few mildly prominent posterior chain nodes measure up to 9 mm on the right. Vascular: Normal intravascular enhancement seen throughout the neck. Limited intracranial: Unremarkable. Visualized orbits: Not included on this exam. Mastoids and visualized paranasal sinuses: Visualized paranasal sinuses are clear. Mastoid air cells and middle ear cavities are largely well pneumatized and free of fluid. Skeleton: No acute osseous finding. No discrete lytic or blastic osseous lesions. Mild cervical spondylolysis noted at C6-7. Upper chest: Remainder of the partially visualized upper chest demonstrates no other acute finding. Partially visualized lungs are clear. Other: None. IMPRESSION: 1. Hazy inflammatory stranding with swelling involving the subcutaneous fat of the lower anterior neck, just inferior to the mandible, suspicious for possible infection/cellulitis. Moderate diffuse retropharyngeal effusion suspected to be reactive. No discrete retropharyngeal abscess. 2. Prominent periapical lucency about the right maxillary lateral incisor with adjacent inflammatory stranding, concerning for acute odontogenic infection. Superimposed 8 x 12 x 11 mm hypodense collection within this region compatible with early odontogenic abscess. 3. Enlarged nodal conglomerates within the right lower neck and right mediastinum measuring up to 6 cm in diameter. Given size, these are felt to be unlikely be reactive in nature, and are most concerning for possible concomitant lymphoproliferative disorder or nodal metastases. No primary mass lesion seen within the neck. Correlation with histology may be helpful for further evaluation. Additional prominent bilateral cervical lymph nodes as above are more indeterminate, and could be reactive. Electronically Signed   By: Jeannine Boga M.D.   On: 03/04/2019 19:57   Ct  Angio Chest Pe W And/or Wo Contrast  Result Date: 03/11/2019 CLINICAL DATA:  Peritonsillar abscess, neck, right chest, and right breast swelling, neck pain EXAM: CT ANGIOGRAPHY CHEST WITH CONTRAST TECHNIQUE: Multidetector CT imaging of the chest was  performed using the standard protocol during bolus administration of intravenous contrast. Multiplanar CT image reconstructions and MIPs were obtained to evaluate the vascular anatomy. CONTRAST:  118mL OMNIPAQUE IOHEXOL 350 MG/ML SOLN COMPARISON:  CT neck, 03/05/2019 FINDINGS: Cardiovascular: Examination for pulmonary embolism is very limited by body habitus and poor contrast bolus, main pulmonary artery HU = 151. Within this limitation, there is no obvious central pulmonary embolism. Evaluation of the lobar and more distal pulmonary arteries is nondiagnostic. There is bulky lymphadenopathy which appears to obstruct the right brachiocephalic vein and superior vena cava. Normal heart size. No pericardial effusion. Mediastinum/Nodes: Redemonstrated, very bulky lower right cervical and right superior mediastinal lymphadenopathy, largest nodes measuring at least 5.8 cm (series 6, image 27). Thyroid gland, trachea, and esophagus demonstrate no significant findings. Lungs/Pleura: Probable right basilar atelectasis. No pleural effusion or pneumothorax. Upper Abdomen: No acute abnormality. Bilateral adrenal nodules measuring approximately 2.1 cm on the left and 1.3 cm on the right. Musculoskeletal: No chest wall abnormality. No acute or significant osseous findings. Review of the MIP images confirms the above findings. IMPRESSION: 1. Examination for pulmonary embolism is very limited by body habitus and poor contrast bolus, main pulmonary artery HU = 151. Within this limitation, there is no obvious central pulmonary embolism. Evaluation of the lobar and more distal pulmonary arteries is nondiagnostic. 2. Redemonstrated, very bulky lower right cervical and right superior  mediastinal lymphadenopathy, largest nodes measuring at least 5.8 cm (series 6, image 27). This appears to obstruct the right brachiocephalic vein and superior vena cava. Findings are highly suspicious for malignancy, including lymphoma and metastatic disease. 3. Bilateral nonspecific soft tissue adrenal nodules, concerning for metastatic disease although incompletely characterized. 4. Probable right basilar atelectasis without definite acute airspace disease. Electronically Signed   By: Eddie Candle M.D.   On: 03/11/2019 18:34   Ct Abdomen Pelvis W Contrast  Result Date: 03/12/2019 CLINICAL DATA:  37 year old with bulky lymphadenopathy in the RIGHT LOWER neck and in the RIGHT superior mediastinum. Evaluate for abdominopelvic lymphadenopathy. EXAM: CT ABDOMEN AND PELVIS WITH CONTRAST TECHNIQUE: Multidetector CT imaging of the abdomen and pelvis was performed using the standard protocol following bolus administration of intravenous contrast. CONTRAST:  132mL OMNIPAQUE IOHEXOL 300 MG/ML IV. Oral contrast was also administered. COMPARISON:  No prior abdominopelvic CT. CTA chest 03/11/2019 is correlated. FINDINGS: Lower chest: Dense consolidation dependently in the POSTERIOR RIGHT LOWER LOBE. Visualized lung bases otherwise clear. Normal heart size. Hepatobiliary: Borderline hepatomegaly. No focal hepatic parenchymal abnormality. Normal anatomic variant in that the LEFT lobe of the liver crosses the midline into the LEFT UPPER QUADRANT. Gallbladder normal in appearance without calcified gallstones. No biliary ductal dilation. Pancreas: Normal in appearance without evidence of mass, ductal dilation, or inflammation. Spleen: Normal in size and appearance. Adrenals/Urinary Tract: BILATERAL adrenal masses, that on the LEFT measuring approximately 1.6 x 2.5 x 2.2 cm and that on the RIGHT measuring approximately 1.3 x 2.1 x 2.7 cm. Kidneys normal in size and appearance without focal parenchymal abnormality. No  hydronephrosis. No evidence of urinary tract calculi. Normal appearing urinary bladder. Stomach/Bowel: Stomach normal in appearance for the degree of distention. Normal-appearing small bowel. Moderate stool burden throughout the normal appearing colon. Normal appendix in the RIGHT UPPER pelvis filled with oral contrast. Vascular/Lymphatic: Minimal atherosclerosis involving the RIGHT common iliac artery. No evidence of abdominal aortic atherosclerosis or aneurysm. Normal-appearing portal venous and systemic venous systems. Enlarged hemiazygous vein in the retroperitoneum, likely a collateral pathway due to the SVC compression noted on  the prior CTA chest. No pathologic lymphadenopathy within the abdomen or pelvis. Reproductive: Normal-appearing uterus and ovaries without evidence of adnexal mass. Other: Edema involving the LATERAL subcutaneous tissues of the LOWER chest and upper abdomen. Musculoskeletal: Degenerative disc disease and spondylosis involving the visualized thoracic spine. Chronic L5-S1 disc protrusion with calcification in the POSTERIOR annular fibers. No acute findings. IMPRESSION: 1. No acute abnormalities involving the abdomen or pelvis. 2. Borderline hepatomegaly without focal hepatic parenchymal abnormality. 3. Bilateral adrenal masses, measured above. Given the fact that the patient may have a malignancy, a dedicated CT of the abdomen without and with contrast (adrenal protocol) is recommended in further evaluation. This recommendation follows ACR consensus guidelines: Managing Incidental Findings on Abdominal CT: White Paper of the ACR Incidental Findings Committee. J Am Coll Radiol 2010;7:754-773. Electronically Signed   By: Evangeline Dakin M.D.   On: 03/12/2019 21:49   Ct Abdomen W Wo Contrast  Result Date: 03/16/2019 CLINICAL DATA:  Bilateral adrenal nodules. EXAM: CT ABDOMEN WITHOUT AND WITH CONTRAST TECHNIQUE: Multidetector CT imaging of the abdomen was performed following the standard  protocol before and following the bolus administration of intravenous contrast. CONTRAST:  126mL OMNIPAQUE IOHEXOL 300 MG/ML  SOLN COMPARISON:  03/12/2019 FINDINGS: Lower chest: Right lower lobe collapse/consolidation. Hepatobiliary: Wedge-shaped area of altered perfusion noted in the lateral segment left liver along the falciform ligament. No suspicious focal abnormality within the liver parenchyma. Gallbladder decompressed. No intrahepatic or extrahepatic biliary dilation. Pancreas: No focal mass lesion. No dilatation of the main duct. No intraparenchymal cyst. No peripancreatic edema. Spleen: No splenomegaly. No focal mass lesion. Adrenals/Urinary Tract: 2.1 cm right adrenal nodule again noted. Absolute attenuation of this nodule is too high to allow classification as adenoma. Lesion shows no substantial enhancement after IV contrast administration and cannot be definitively characterized based on absolute washout or relative washout characteristics. 2.5 cm left adrenal nodule also has attenuation too high on precontrast imaging to allow characterization as an adenoma. Absolute washout and relative washout characteristics of this nodule or indeterminate. Kidneys unremarkable. Stomach/Bowel: Stomach is unremarkable. No gastric wall thickening. No evidence of outlet obstruction. Duodenum is normally positioned as is the ligament of Treitz. Small bowel loops and colonic segments of the abdomen are nondilated. Vascular/Lymphatic: No abdominal aortic aneurysm. There is no gastrohepatic or hepatoduodenal ligament lymphadenopathy. No intraperitoneal or retroperitoneal lymphadenopathy. Other: No intraperitoneal free fluid. Musculoskeletal: No worrisome lytic or sclerotic osseous abnormality. IMPRESSION: 1. Bilateral adrenal nodules as noted on recent CT scan. These nodules cannot be characterized by either absolute attenuation or washout characteristics. If these represent lipid poor adenomas, MRI of the abdomen may be  able to further characterize. Given the history of thoracic lymphadenopathy, close follow-up recommended. Electronically Signed   By: Misty Stanley M.D.   On: 03/16/2019 16:47   [place] Core Biopsy (lymph Nodes)  Result Date: 03/14/2019 INDICATION: 38 year old with cervical and mediastinal lymphadenopathy. Recent tooth extractions for dental abscess. Patient also has a retropharyngeal effusion. Plan for ultrasound-guided biopsy of the right cervical lymphadenopathy. EXAM: ULTRASOUND-GUIDED RIGHT SUPRACLAVICULAR LYMPH NODE BIOPSY MEDICATIONS: None. ANESTHESIA/SEDATION: Moderate (conscious) sedation was employed during this procedure. A total of Versed 2.0 mg and Fentanyl 100 mcg was administered intravenously. Moderate Sedation Time: 15 minutes the patient's level of consciousness and vital signs were monitored continuously by radiology nursing throughout the procedure under my direct supervision. FLUOROSCOPY TIME:  None COMPLICATIONS: None immediate. PROCEDURE: Informed written consent was obtained from the patient after a thorough discussion of the procedural risks, benefits and  alternatives. A timeout was performed prior to the initiation of the procedure. Right side of the neck was prepped with chlorhexidine and sterile field was created. Skin was anesthetized with 1% lidocaine. Using ultrasound guidance, 18 gauge core needle was directed into the large nodal mass in the right supraclavicular area. A total of 6 core biopsies were obtained with an 18 gauge core device. Specimens placed in saline. Bandage placed over the puncture site. FINDINGS: Large lymph nodes on both sides of the neck. Large irregular nodal mass in the right supraclavicular area was targeted for biopsy. Needle position was confirmed within this nodal mass. No evidence for bleeding or hematoma formation at the end of the procedure. Visualized right internal jugular vein is small and does not completely compress. Evidence for nonocclusive thrombus  in the right internal jugular vein. Findings are similar to the recent CT. IMPRESSION: Ultrasound-guided core biopsies of the right supraclavicular nodal mass. Abnormal appearance of the right internal jugular vein. Findings are compatible with nonocclusive thrombus of unknown age. Findings are similar to the recent CT and could be secondary from the inflammatory changes in the neck and compression from the large supraclavicular nodal mass. Electronically Signed   By: Markus Daft M.D.   On: 03/14/2019 17:13    ASSESSMENT: This is a 38 year old female from [place], [place] with   1.  Newly diagnosed non-small cell cancer, squamous cell.  P 16 is pending. 2.  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  PLAN: Simran appears unchanged.  We discussed her current biopsy results and that we are awaiting further information.  Recommend that she continue radiation under the direction of Dr. Lisbeth Renshaw.  For her right arm swelling, recommend that she elevate this is much as possible.  Will asked nursing to place Ace wrap for compression.  I had a discussion with the patient regarding where she wishes to receive her outpatient medical oncology care.  She was given the option of seeing medical oncology in [place] versus follow-up at the [facility] versus coming to [place].  The patient would like to pursue her medical oncology care at [facility].  I have contacted their office and made a referral.  She will be seen on June 3 at 1:00 PM.   LOS: 7 days   Mikey Bussing, DNP, AGPCNP-BC, AOCNP 03/18/19

## 2019-03-18 NOTE — Progress Notes (Signed)
[facility] Therapy Treatment Record Phone [phone]   Radiation Therapy was administered to Tara Aguilar on: 03/18/2019  2:17 PM and was treatment # 2 out of a planned course of 10 treatments.  Radiation Treatment  1). Beam photons with 6-10 energy  2). Brachytherapy None  3). Stereotactic Radiosurgery None  4). Other Radiation None     Wynona Meals, RT

## 2019-03-19 LAB — GLUCOSE, CAPILLARY
Glucose-Capillary: 123 mg/dL — ABNORMAL HIGH (ref 70–99)
Glucose-Capillary: 155 mg/dL — ABNORMAL HIGH (ref 70–99)
Glucose-Capillary: 218 mg/dL — ABNORMAL HIGH (ref 70–99)

## 2019-03-19 MED ORDER — METHYLPREDNISOLONE SODIUM SUCC 125 MG IJ SOLR
60.0000 mg | Freq: Three times a day (TID) | INTRAMUSCULAR | Status: DC
  Administered 2019-03-19 – 2019-03-22 (×9): 60 mg via INTRAVENOUS
  Filled 2019-03-19 (×9): qty 2

## 2019-03-19 MED ORDER — HYDROCODONE-ACETAMINOPHEN 10-325 MG PO TABS
1.0000 | ORAL_TABLET | ORAL | Status: DC | PRN
  Administered 2019-03-21 – 2019-03-22 (×2): 1 via ORAL
  Filled 2019-03-19 (×2): qty 1

## 2019-03-19 MED ORDER — METHYLPREDNISOLONE SODIUM SUCC 125 MG IJ SOLR: 80.0000 mg | Freq: Three times a day (TID) | INTRAMUSCULAR | Status: DC

## 2019-03-19 MED ORDER — FUROSEMIDE 10 MG/ML IJ SOLN
40.0000 mg | Freq: Two times a day (BID) | INTRAMUSCULAR | Status: DC
  Administered 2019-03-19 – 2019-03-22 (×6): 40 mg via INTRAVENOUS
  Filled 2019-03-19 (×6): qty 4

## 2019-03-19 NOTE — Progress Notes (Signed)
PROGRESS NOTE    Tara Aguilar  XKG:[id number] DOB: 01-28-1981 DOA: 03/11/2019 PCP: [facility]   Brief Narrative: Patient is a 38 year old female with history of depression, obesity who presented with complaints of progressive dyspnea, swelling of the neck, chest and bilateral upper extremities.  She was recently admitted on 03/05/2019 and was discharged 3 days later after managed for peritonsillar abscess.  She was taking clindamycin at home.  She was admitted for the evaluation of dyspnea.  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  There was concern for malignancy.  She was also found to have continued retropharyngeal causing dysphagia and also found to have significant cervical metastatic lymphadenopathy.  ENT was consulted and she underwent IND of the retropharyngeal effusion .  We are consulted and she underwent biopsy of her lymphadenopathy.  Radiation oncology, oncology consulted.  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  Now has severe edema on bilateral upper extremities.  Started on urgent radiation therapy. CTA chest showed very lower right cervical and right superior mediastinal lymphadenopathy largest measuring 5.8 cm. Appears to obstruct of the right brachiocephalic vein and superior vena cava.  Underwent lymph node biopsy by IR.  CT abdomen/pelvis also showed  bilateral adrenal nodules.Oncology following.Undergoing radiation therapy.  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  Oncology following.  Planning for PET scan as an outpatient.  Also planning for MRI of the brain to evaluate for brain metastasis.  Recent retropharyngeal/dental abscess: Recent history of dental extraction.  Continue to have retropharyngeal effusion so underwent I&D by ENT.  Currently on clindamycin.  Bilateral upper extremity edema: Mild improvement today.  This is from her subarachnoid syndrome.  She was given a dose of Lasix yesterday.Will continue lasix. She was also given a dose of solumedol last night.  Depression: Continue Wellbutrin  Glucose intolerance.  Hemoglobin A1c of 6.2.  Morbid obesity: BMI of 46.9.  Tobacco abuse: Discussed cessation.  Declined nicotine patch.  Undiagnosed sleep apnea: She never had a sleep study.  Recommend sleep study as an outpatient.          DVT prophylaxis:Lovenox Code Status: Full Family Communication: None present at the bedside Disposition Plan: Most likely she will stay for a few more days for radiation therapy.  Discharge planning after clearance from radiation oncology   Consultants: Oncology, radiation oncology, ENT  Procedures: I&D, lymph node biopsy  Antimicrobials:  Anti-infectives (From admission, onward)   Start     Dose/Rate Route Frequency Ordered Stop   03/18/19 1400  clindamycin (CLEOCIN) capsule 300 mg     300 mg Oral Every 8 hours 03/18/19 1154     03/12/19 0200  clindamycin (CLEOCIN) IVPB 900 mg  Status:  Discontinued     900 mg 100 mL/hr over 30 Minutes Intravenous Every 8 hours 03/12/19 0105 03/18/19 1154  Subjective: Patient seen and examined the bedside this morning.  Hemodynamically stable.  Still has significant neck swelling, upper chest, upper back and bilateral upper  extremity swelling.    Objective: Vitals:   03/18/19 1341 03/18/19 2200 03/19/19 0608 03/19/19 1402  BP: 127/68 127/77 138/87 (!) 145/100  Pulse: (!) 103 (!) 109 99 (!) 113  Resp: 18 20 20 12   Temp: 98.7 F (37.1 C) (!) 97.5 F (36.4 C) 98.4 F (36.9 C) 98.2 F (36.8 C)  TempSrc: Oral Oral Oral Oral  SpO2: 96% 96% 95% 95%  Weight:      Height:        Intake/Output Summary (Last 24 hours) at 03/19/2019 1410 Last data filed at 03/18/2019 1638 Gross per 24 hour  Intake 240 ml  Output 400 ml  Net -160 ml   Filed Weights   03/11/19 1509 03/12/19 0030  Weight: 131.5 kg 132 kg    Examination:   General exam: Not in distress,morbidly obese HEENT:PERRL,Oral mucosa moist, Ear/Nose normal on gross exam Respiratory system: Bilateral equal air entry, normal vesicular breath sounds, no wheezes or crackles  Cardiovascular system: S1 & S2 heard, RRR. No JVD, murmurs, rubs, gallops or clicks. Gastrointestinal system: Abdomen is nondistended, soft and nontender. No organomegaly or masses felt. Normal bowel sounds heard. Central nervous system: Alert and oriented. No focal neurological deficits. Extremities: No edema, no clubbing ,no cyanosis, distal peripheral pulses palpable. Edema of the upper chest, upper back, bilateral upper extremities. Skin: No rashes, lesions or ulcers,no icterus ,no pallor MSK: Normal muscle bulk,tone ,power Psychiatry: Judgement and insight appear normal. Mood & affect appropriate.    Data Reviewed: I have personally reviewed following labs and imaging studies  CBC: Recent Labs  Lab 03/16/19 0809 03/17/19 0245  WBC 13.4* 10.1  HGB 13.5 12.7  HCT 41.7 39.4  MCV 86.7 87.9  PLT 293 676   Basic Metabolic Panel: Recent Labs  Lab 03/13/19 0354 03/14/19 0446 03/16/19 0809 03/17/19 0245 03/18/19 0634  NA 137 135 138 138  --   K 3.7 4.0 4.2 4.1  --   CL 105 104 105 104  --   CO2 23 22 25 26   --   GLUCOSE 103* 96 124* 113*  --   BUN 17 18 12 16    --   CREATININE 1.01* 1.03* 0.97 1.12* 1.04*  CALCIUM 9.2 8.8* 9.5 9.4  --   MG 1.9  --  2.1 1.9  --    GFR: Estimated Creatinine Clearance: 101.4 mL/min (A) (by C-G formula based on SCr of 1.04 mg/dL (H)). Liver Function Tests: No results for input(s): AST, ALT, ALKPHOS, BILITOT, PROT, ALBUMIN in the last 168 hours. No results for input(s): LIPASE, AMYLASE in the last 168 hours. No results for input(s): AMMONIA in the last 168 hours. Coagulation Profile: Recent Labs  Lab 03/13/19 1420  INR 1.3*   Cardiac Enzymes: No results for input(s): CKTOTAL, CKMB, CKMBINDEX, TROPONINI in the last 168 hours. BNP (last 3 results) No results for input(s): PROBNP in the last 8760 hours. HbA1C: No results for input(s): HGBA1C in the last 72 hours. CBG: Recent Labs  Lab 03/18/19 0012 03/18/19 0741 03/18/19 1555 03/18/19 2300 03/19/19 0753  GLUCAP 130* 101* 106* 105* 155*   Lipid Profile: No results for input(s): CHOL, HDL, LDLCALC, TRIG, CHOLHDL, LDLDIRECT in the last 72 hours. Thyroid Function Tests: No results for input(s): TSH, T4TOTAL, FREET4, T3FREE, THYROIDAB in the last 72 hours. Anemia Panel: No results for input(s): VITAMINB12, FOLATE,  FERRITIN, TIBC, IRON, RETICCTPCT in the last 72 hours. Sepsis Labs: No results for input(s): PROCALCITON, LATICACIDVEN in the last 168 hours.  Recent Results (from the past 240 hour(s))  SARS Coronavirus 2 (CEPHEID - Performed in [facility] hospital lab), Hosp Order     Status: None   Collection Time: 03/11/19  8:55 PM  Result Value Ref Range Status   SARS Coronavirus 2 NEGATIVE NEGATIVE Final    Comment: (NOTE) If result is NEGATIVE SARS-CoV-2 target nucleic acids are NOT DETECTED. The SARS-CoV-2 RNA is generally detectable in upper and lower  respiratory specimens during the acute phase of infection. The lowest  concentration of SARS-CoV-2 viral copies this assay can detect is 250  copies / mL. A negative result does not preclude  SARS-CoV-2 infection  and should not be used as the sole basis for treatment or other  patient management decisions.  A negative result may occur with  improper specimen collection / handling, submission of specimen other  than nasopharyngeal swab, presence of viral mutation(s) within the  areas targeted by this assay, and inadequate number of viral copies  (<250 copies / mL). A negative result must be combined with clinical  observations, patient history, and epidemiological information. If result is POSITIVE SARS-CoV-2 target nucleic acids are DETECTED. The SARS-CoV-2 RNA is generally detectable in upper and lower  respiratory specimens dur ing the acute phase of infection.  Positive  results are indicative of active infection with SARS-CoV-2.  Clinical  correlation with patient history and other diagnostic information is  necessary to determine patient infection status.  Positive results do  not rule out bacterial infection or co-infection with other viruses. If result is PRESUMPTIVE POSTIVE SARS-CoV-2 nucleic acids MAY BE PRESENT.   A presumptive positive result was obtained on the submitted specimen  and confirmed on repeat testing.  While 2019 novel coronavirus  (SARS-CoV-2) nucleic acids may be present in the submitted sample  additional confirmatory testing may be necessary for epidemiological  and / or clinical management purposes  to differentiate between  SARS-CoV-2 and other Sarbecovirus currently known to infect humans.  If clinically indicated additional testing with an alternate test  methodology [phone]) is advised. The SARS-CoV-2 RNA is generally  detectable in upper and lower respiratory sp ecimens during the acute  phase of infection. The expected result is Negative. Fact Sheet for Patients:  StrictlyIdeas.no Fact Sheet for Healthcare Providers: BankingDealers.co.za This test is not yet approved or cleared by the  [place] FDA and has been authorized for detection and/or diagnosis of SARS-CoV-2 by FDA under an Emergency Use Authorization (EUA).  This EUA will remain in effect (meaning this test can be used) for the duration of the COVID-19 declaration under Section 564(b)(1) of the Act, 21 U.S.C. section 360bbb-3(b)(1), unless the authorization is terminated or revoked sooner. Performed at [facility], [address] [place], [place] [address]   Aerobic/Anaerobic Culture (surgical/deep wound)     Status: None (Preliminary result)   Collection Time: 03/15/19  8:15 AM  Result Value Ref Range Status   Specimen Description ABSCESS  Final   Special Requests RETROPHARYNGEAL  Final   Gram Stain   Final    NO WBC SEEN NO ORGANISMS SEEN Performed at [facility], [address], [place], [place] [address]    Culture   Final    RARE Consistent with normal respiratory flora. NO ANAEROBES ISOLATED; CULTURE IN PROGRESS FOR 5 DAYS    Report Status PENDING  Incomplete  Radiology Studies: No results found.      Scheduled Meds: . buPROPion  150 mg Oral Daily  . clindamycin  300 mg Oral Q8H  . enoxaparin (LOVENOX) injection  40 mg Subcutaneous Q24H  . furosemide  40 mg Intravenous BID   Continuous Infusions:    LOS: 8 days    Time spent: 35 mins.More than 50% of that time was spent in counseling and/or coordination of care.      Shelly Coss, MD [facility] Pager [phone]  If 7PM-7AM, please contact night-coverage [email or web address] Password [email or web address] 03/19/2019, 2:10 PM

## 2019-03-20 LAB — GLUCOSE, CAPILLARY
Glucose-Capillary: 132 mg/dL — ABNORMAL HIGH (ref 70–99)
Glucose-Capillary: 135 mg/dL — ABNORMAL HIGH (ref 70–99)
Glucose-Capillary: 138 mg/dL — ABNORMAL HIGH (ref 70–99)
Glucose-Capillary: 155 mg/dL — ABNORMAL HIGH (ref 70–99)

## 2019-03-20 LAB — AEROBIC/ANAEROBIC CULTURE W GRAM STAIN (SURGICAL/DEEP WOUND)
Culture: NORMAL
Gram Stain: NONE SEEN

## 2019-03-20 NOTE — Progress Notes (Signed)
PROGRESS NOTE    Tara Aguilar  ZHG:[id number] DOB: 1981/01/28 DOA: 03/11/2019 PCP: [facility], [facility]   Brief Narrative: Patient is a 38 year old female with history of depression, obesity who presented with complaints of progressive dyspnea, swelling of the neck, chest and bilateral upper extremities.  She was recently admitted on 03/05/2019 and was discharged 3 days later after managed for peritonsillar abscess.  She was taking clindamycin at home.  She was admitted for the evaluation of dyspnea.  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  There was concern for malignancy.  She was also found to have continued retropharyngeal causing dysphagia and also found to have significant cervical metastatic lymphadenopathy.  ENT was consulted and she underwent IND of the retropharyngeal effusion .  We are consulted and she underwent biopsy of her lymphadenopathy.  Radiation oncology, oncology consulted.  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  Now has severe edema on bilateral upper extremities.  Started on urgent radiation therapy. CTA chest showed very lower right cervical and right superior mediastinal lymphadenopathy largest measuring 5.8 cm. Appears to obstruct of the right brachiocephalic vein and superior vena cava.  Underwent lymph node biopsy by IR.  CT abdomen/pelvis also showed bilateral  adrenal nodules.Oncology following.Undergoing radiation therapy.Next therapy on Tuesday.  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  Oncology following.  Planning for PET scan as an outpatient.  Also planning for MRI of the brain to evaluate for brain metastasis.  Recent retropharyngeal/dental abscess: Recent history of dental extraction.  Continue to have retropharyngeal effusion so underwent I&D by ENT.  Currently on clindamycin.  Bilateral upper extremity edema/neck edema: Improving.  This is from her superior vena cava syndrome.  Continue Lasix and Solu-Medrol  Depression: Continue Wellbutrin  Glucose intolerance.  Hemoglobin A1c of 6.2.  Morbid obesity: BMI of 46.9.  Tobacco abuse: Discussed cessation.  Declined nicotine patch.  Undiagnosed sleep apnea: She never had a sleep study.  Recommend sleep study as an outpatient.          DVT prophylaxis:Lovenox Code Status: Full Family Communication: None present at the bedside Disposition Plan: Waiting for radiation therapy on Tuesday.  Discharge planning after clearance from radiation oncology   Consultants: Oncology, radiation oncology, ENT  Procedures: I&D, lymph node biopsy  Antimicrobials:  Anti-infectives (From admission, onward)   Start     Dose/Rate Route Frequency Ordered Stop   03/18/19 1400  clindamycin (CLEOCIN) capsule 300 mg     300 mg Oral Every 8 hours 03/18/19 1154     03/12/19 0200  clindamycin (CLEOCIN) IVPB 900 mg  Status:  Discontinued     900 mg 100 mL/hr over 30 Minutes Intravenous Every 8 hours 03/12/19 0105 03/18/19 1154      Subjective: Patient seen and examined at bedside this morning.  Hemodynamically stable.  Bilateral upper extremity swelling has  improved but there is still significant edema on the left upper extremity.  No chest pain or shortness of breath.   Objective: Vitals:    03/19/19 2204 03/20/19 0045 03/20/19 0221 03/20/19 0517  BP: 126/80 134/76  116/73  Pulse: (!) 121 (!) 111 (!) 107 (!) 105  Resp: 20 16  17   Temp: 98.4 F (36.9 C) 97.6 F (36.4 C)  (!) 97.5 F (36.4 C)  TempSrc: Oral Oral  Oral  SpO2: 96% 96%  95%  Weight:      Height:        Intake/Output Summary (Last 24 hours) at 03/20/2019 1138 Last data filed at 03/20/2019 0645 Gross per 24 hour  Intake 120 ml  Output 2000 ml  Net -1880 ml   Filed Weights   03/11/19 1509 03/12/19 0030  Weight: 131.5 kg 132 kg    Examination:   General exam: Appears calm and comfortable ,Not in distress,morbidly obese HEENT:PERRL,Oral mucosa moist, Ear/Nose normal on gross exam Respiratory system: Bilateral equal air entry, normal vesicular breath sounds, no wheezes or crackles  Cardiovascular system: S1 & S2 heard, RRR. No JVD, murmurs, rubs, gallops or clicks. Gastrointestinal system: Abdomen is nondistended, soft and nontender. No organomegaly or masses felt. Normal bowel sounds heard. Central nervous system: Alert and oriented. No focal neurological deficits. Extremities: Edema of the upper chest, upper back, bilateral upper extremities. Skin: No rashes, lesions or ulcers,no icterus ,no pallor MSK: Normal muscle bulk,tone ,power Psychiatry: Judgement and insight appear normal. Mood & affect appropriate.    Data Reviewed: I have personally reviewed following labs and imaging studies  CBC: Recent Labs  Lab 03/16/19 0809 03/17/19 0245  WBC 13.4* 10.1  HGB 13.5 12.7  HCT 41.7 39.4  MCV 86.7 87.9  PLT 293 599   Basic Metabolic Panel: Recent Labs  Lab 03/14/19 0446 03/16/19 0809 03/17/19 0245 03/18/19 0634  NA 135 138 138  --   K 4.0 4.2 4.1  --   CL 104 105 104  --   CO2 22 25 26   --   GLUCOSE 96 124* 113*  --   BUN 18 12 16   --   CREATININE 1.03* 0.97 1.12* 1.04*  CALCIUM 8.8* 9.5 9.4  --   MG  --  2.1 1.9  --    GFR: Estimated Creatinine Clearance: 101.4 mL/min (A) (by C-G  formula based on SCr of 1.04 mg/dL (H)). Liver Function Tests: No results for input(s): AST, ALT, ALKPHOS, BILITOT, PROT, ALBUMIN in the last 168 hours. No results for input(s): LIPASE, AMYLASE in the last 168 hours. No results for input(s): AMMONIA in the last 168 hours. Coagulation Profile: Recent Labs  Lab 03/13/19 1420  INR 1.3*   Cardiac Enzymes: No results for input(s): CKTOTAL, CKMB, CKMBINDEX, TROPONINI in the last 168 hours. BNP (last 3 results) No results for input(s): PROBNP in the last 8760 hours. HbA1C: No results for input(s): HGBA1C in the last 72 hours. CBG: Recent Labs  Lab 03/18/19 2300 03/19/19 0753 03/19/19 1559 03/19/19 2355 03/20/19 0741  GLUCAP 105* 155* 123* 218* 132*   Lipid Profile: No results for input(s): CHOL, HDL, LDLCALC, TRIG, CHOLHDL, LDLDIRECT in the last 72 hours. Thyroid Function Tests: No results for input(s): TSH, T4TOTAL, FREET4, T3FREE, THYROIDAB in the last 72 hours. Anemia Panel: No results for input(s): VITAMINB12, FOLATE, FERRITIN, TIBC, IRON, RETICCTPCT in the last 72 hours. Sepsis Labs: No results for input(s): PROCALCITON, LATICACIDVEN in the last 168 hours.  Recent Results (from the past  240 hour(s))  SARS Coronavirus 2 (CEPHEID - Performed in [facility] hospital lab), Hosp Order     Status: None   Collection Time: 03/11/19  8:55 PM  Result Value Ref Range Status   SARS Coronavirus 2 NEGATIVE NEGATIVE Final    Comment: (NOTE) If result is NEGATIVE SARS-CoV-2 target nucleic acids are NOT DETECTED. The SARS-CoV-2 RNA is generally detectable in upper and lower  respiratory specimens during the acute phase of infection. The lowest  concentration of SARS-CoV-2 viral copies this assay can detect is 250  copies / mL. A negative result does not preclude SARS-CoV-2 infection  and should not be used as the sole basis for treatment or other  patient management decisions.  A negative result may occur with  improper specimen  collection / handling, submission of specimen other  than nasopharyngeal swab, presence of viral mutation(s) within the  areas targeted by this assay, and inadequate number of viral copies  (<250 copies / mL). A negative result must be combined with clinical  observations, patient history, and epidemiological information. If result is POSITIVE SARS-CoV-2 target nucleic acids are DETECTED. The SARS-CoV-2 RNA is generally detectable in upper and lower  respiratory specimens dur ing the acute phase of infection.  Positive  results are indicative of active infection with SARS-CoV-2.  Clinical  correlation with patient history and other diagnostic information is  necessary to determine patient infection status.  Positive results do  not rule out bacterial infection or co-infection with other viruses. If result is PRESUMPTIVE POSTIVE SARS-CoV-2 nucleic acids MAY BE PRESENT.   A presumptive positive result was obtained on the submitted specimen  and confirmed on repeat testing.  While 2019 novel coronavirus  (SARS-CoV-2) nucleic acids may be present in the submitted sample  additional confirmatory testing may be necessary for epidemiological  and / or clinical management purposes  to differentiate between  SARS-CoV-2 and other Sarbecovirus currently known to infect humans.  If clinically indicated additional testing with an alternate test  methodology [phone]) is advised. The SARS-CoV-2 RNA is generally  detectable in upper and lower respiratory sp ecimens during the acute  phase of infection. The expected result is Negative. Fact Sheet for Patients:  StrictlyIdeas.no Fact Sheet for Healthcare Providers: BankingDealers.co.za This test is not yet approved or cleared by the [place] FDA and has been authorized for detection and/or diagnosis of SARS-CoV-2 by FDA under an Emergency Use Authorization (EUA).  This EUA will remain in effect  (meaning this test can be used) for the duration of the COVID-19 declaration under Section 564(b)(1) of the Act, 21 U.S.C. section 360bbb-3(b)(1), unless the authorization is terminated or revoked sooner. Performed at [facility], [address] [place], [place] [address]   Aerobic/Anaerobic Culture (surgical/deep wound)     Status: None   Collection Time: 03/15/19  8:15 AM  Result Value Ref Range Status   Specimen Description ABSCESS  Final   Special Requests RETROPHARYNGEAL  Final   Gram Stain NO WBC SEEN NO ORGANISMS SEEN   Final   Culture   Final    RARE Consistent with normal respiratory flora. NO ANAEROBES ISOLATED Performed at [facility], [place] [address], [place], [place] [address]    Report Status 03/20/2019 FINAL  Final         Radiology Studies: No results found.      Scheduled Meds: . buPROPion  150 mg Oral Daily  . clindamycin  300 mg Oral Q8H  . enoxaparin (LOVENOX) injection  40 mg Subcutaneous Q24H  . furosemide  40 mg Intravenous BID  . methylPREDNISolone (SOLU-MEDROL) injection  60 mg Intravenous Q8H   Continuous Infusions:    LOS: 9 days    Time spent: 35 mins.More than 50% of that time was spent in counseling and/or coordination of care.      Shelly Coss, MD [facility] Pager [phone]  If 7PM-7AM, please contact night-coverage [email or web address] Password [facility] 03/20/2019, 11:38 AM

## 2019-03-21 LAB — CBC WITH DIFFERENTIAL/PLATELET
Abs Immature Granulocytes: 0.14 10*3/uL — ABNORMAL HIGH (ref 0.00–0.07)
Basophils Absolute: 0 10*3/uL (ref 0.0–0.1)
Basophils Relative: 0 %
Eosinophils Absolute: 0 10*3/uL (ref 0.0–0.5)
Eosinophils Relative: 0 %
HCT: 43.8 % (ref 36.0–46.0)
Hemoglobin: 13.5 g/dL (ref 12.0–15.0)
Immature Granulocytes: 1 %
Lymphocytes Relative: 6 %
Lymphs Abs: 1.1 10*3/uL (ref 0.7–4.0)
MCH: 27.8 pg (ref 26.0–34.0)
MCHC: 30.8 g/dL (ref 30.0–36.0)
MCV: 90.3 fL (ref 80.0–100.0)
Monocytes Absolute: 0.7 10*3/uL (ref 0.1–1.0)
Monocytes Relative: 4 %
Neutro Abs: 15.3 10*3/uL — ABNORMAL HIGH (ref 1.7–7.7)
Neutrophils Relative %: 89 %
Platelets: 387 10*3/uL (ref 150–400)
RBC: 4.85 MIL/uL (ref 3.87–5.11)
RDW: 15.4 % (ref 11.5–15.5)
WBC: 17.3 10*3/uL — ABNORMAL HIGH (ref 4.0–10.5)
nRBC: 0 % (ref 0.0–0.2)

## 2019-03-21 LAB — GLUCOSE, CAPILLARY
Glucose-Capillary: 155 mg/dL — ABNORMAL HIGH (ref 70–99)
Glucose-Capillary: 169 mg/dL — ABNORMAL HIGH (ref 70–99)

## 2019-03-21 LAB — BASIC METABOLIC PANEL
Anion gap: 10 (ref 5–15)
BUN: 34 mg/dL — ABNORMAL HIGH (ref 6–20)
CO2: 27 mmol/L (ref 22–32)
Calcium: 10.4 mg/dL — ABNORMAL HIGH (ref 8.9–10.3)
Chloride: 101 mmol/L (ref 98–111)
Creatinine, Ser: 1.15 mg/dL — ABNORMAL HIGH (ref 0.44–1.00)
GFR calc Af Amer: >60 mL/min (ref >60)
GFR calc non Af Amer: 60 mL/min — ABNORMAL LOW (ref >60)
Glucose, Bld: 144 mg/dL — ABNORMAL HIGH (ref 70–99)
Potassium: 4.5 mmol/L (ref 3.5–5.1)
Sodium: 138 mmol/L (ref 135–145)

## 2019-03-21 MED ORDER — LIDOCAINE 5 % EX PTCH
1.0000 | MEDICATED_PATCH | CUTANEOUS | Status: DC
  Administered 2019-03-22: 1 via TRANSDERMAL
  Filled 2019-03-21: qty 1

## 2019-03-21 NOTE — Plan of Care (Signed)
Able to care for self with ADL's and no s/s of anxiety at this time.

## 2019-03-21 NOTE — Progress Notes (Signed)
PROGRESS NOTE    Tara Aguilar  IDP:[id number] DOB: January 11, 1981 DOA: 03/11/2019 PCP: [facility]   Brief Narrative: Patient is a 38 year old female with history of depression, obesity who presented with complaints of progressive dyspnea, swelling of the neck, chest and bilateral upper extremities.  She was recently admitted on 03/05/2019 and was discharged 3 days later after managed for peritonsillar abscess.  She was taking clindamycin at home.  She was admitted for the evaluation of dyspnea.  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  There was concern for malignancy.  She was also found to have continued retropharyngeal causing dysphagia and also found to have significant cervical metastatic lymphadenopathy.  ENT was consulted and she underwent IND of the retropharyngeal effusion .  We are consulted and she underwent biopsy of her lymphadenopathy.  Radiation oncology, oncology consulted.  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  Now has severe edema on bilateral upper extremities.  Started on urgent radiation therapy. CTA chest showed very lower right cervical and right superior mediastinal lymphadenopathy largest measuring 5.8 cm. Appears to obstruct of the right brachiocephalic vein and superior vena cava.  Underwent lymph node biopsy by IR.  CT abdomen/pelvis also showed bilateral  adrenal nodules.Oncology following.Undergoing radiation therapy.Next therapy on Tuesday.  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  Oncology following.  Planning for PET scan as an outpatient.  Also planning for MRI of the brain to evaluate for brain metastasis.  Recent retropharyngeal/dental abscess: Recent history of dental extraction.  Continue to have retropharyngeal effusion so underwent I&D by ENT.  She was on  clindamycin.  Completed course  Bilateral upper extremity edema/neck edema: Improving.  This is from her superior vena cava syndrome.  Continue Lasix and Solu-Medrol  Depression: Continue Wellbutrin  Glucose intolerance.  Hemoglobin A1c of 6.2.  Morbid obesity: BMI of 46.9.  Tobacco abuse: Discussed cessation.  Declined nicotine patch.  Undiagnosed sleep apnea: She never had a sleep study.  Recommend sleep study as an outpatient.  Leukocytosis: Most likely secondary to steroids.  We will continue to monitor.          DVT prophylaxis:Lovenox Code Status: Full Family Communication: None present at the bedside Disposition Plan: Waiting for radiation therapy on Tuesday.  Discharge planning after clearance from radiation oncology   Consultants: Oncology, radiation oncology, ENT  Procedures: I&D, lymph node biopsy  Antimicrobials:  Anti-infectives (From admission, onward)   Start     Dose/Rate Route Frequency Ordered Stop   03/18/19 1400  clindamycin (CLEOCIN) capsule 300 mg  Status:  Discontinued     300 mg Oral Every 8 hours 03/18/19 1154 03/21/19 1118   03/12/19 0200  clindamycin (CLEOCIN) IVPB 900 mg  Status:  Discontinued     900 mg 100 mL/hr over 30 Minutes Intravenous Every 8 hours 03/12/19 0105 03/18/19 1154  Subjective: Patient seen and examined at bedside this morning.  Hemodynamically stable.  Bilateral upper extremity edema have been  improving very slowly.  Denies any shortness of breath.  Objective: Vitals:   03/20/19 0517 03/20/19 1326 03/20/19 2030 03/21/19 0542  BP: 116/73 125/81 (!) 141/87 (!) 133/91  Pulse: (!) 105 (!) 102 (!) 110 95  Resp: 17  20 20   Temp: (!) 97.5 F (36.4 C) 97.6 F (36.4 C) 97.7 F (36.5 C) 97.6 F (36.4 C)  TempSrc: Oral  Oral Oral  SpO2: 95% 95% 96% 92%  Weight:      Height:        Intake/Output Summary (Last 24 hours) at 03/21/2019 1118 Last data filed at 03/21/2019 0500 Gross per 24 hour  Intake 240 ml  Output -  Net 240 ml   Filed Weights   03/11/19 1509 03/12/19 0030  Weight: 131.5 kg 132 kg    Examination:   General exam: Appears calm and comfortable ,Not in distress, morbidly obese  HEENT:PERRL,Oral mucosa moist, Ear/Nose normal on gross exam,neck swelling Respiratory system: Bilateral equal air entry, normal vesicular breath sounds, no wheezes or crackles  Cardiovascular system: S1 & S2 heard, RRR. No JVD, murmurs, rubs, gallops or clicks. Gastrointestinal system: Abdomen is nondistended, soft and nontender. No organomegaly or masses felt. Normal bowel sounds heard. Central nervous system: Alert and oriented. No focal neurological deficits. Extremities: Edema of the neck,upper chest, upper back, bilateral upper extremities.   Data Reviewed: I have personally reviewed following labs and imaging studies  CBC: Recent Labs  Lab 03/16/19 0809 03/17/19 0245 03/21/19 0535  WBC 13.4* 10.1 17.3*  NEUTROABS  --   --  15.3*  HGB 13.5 12.7 13.5  HCT 41.7 39.4 43.8  MCV 86.7 87.9 90.3  PLT 293 276 151   Basic Metabolic Panel: Recent Labs  Lab 03/16/19 0809 03/17/19 0245 03/18/19 0634 03/21/19 0535  NA 138 138  --  138  K 4.2 4.1  --  4.5  CL 105 104  --  101  CO2 25 26  --  27  GLUCOSE 124* 113*  --  144*  BUN 12 16  --  34*  CREATININE 0.97 1.12* 1.04* 1.15*  CALCIUM 9.5 9.4  --  10.4*  MG 2.1 1.9  --   --    GFR: Estimated Creatinine Clearance:  91.7 mL/min (A) (by C-G formula based on SCr of 1.15 mg/dL (H)). Liver Function Tests: No results for input(s): AST, ALT, ALKPHOS, BILITOT, PROT, ALBUMIN in the last 168 hours. No results for input(s): LIPASE, AMYLASE in the last 168 hours. No results for input(s): AMMONIA in the last 168 hours. Coagulation Profile: No results for input(s): INR, PROTIME in the last 168 hours. Cardiac Enzymes: No results for input(s): CKTOTAL, CKMB, CKMBINDEX, TROPONINI in the last 168 hours. BNP (last 3 results) No results for input(s): PROBNP in the last 8760 hours. HbA1C: No results for input(s): HGBA1C in the last 72 hours. CBG: Recent Labs  Lab 03/20/19 0741 03/20/19 1153 03/20/19 1618 03/20/19 2354 03/21/19 0742  GLUCAP 132* 138* 155* 135* 155*   Lipid Profile: No results for input(s): CHOL, HDL, LDLCALC, TRIG, CHOLHDL, LDLDIRECT in the last 72 hours. Thyroid Function Tests: No results for input(s): TSH, T4TOTAL, FREET4, T3FREE, THYROIDAB in the last 72 hours. Anemia Panel: No results for input(s): VITAMINB12, FOLATE, FERRITIN, TIBC, IRON, RETICCTPCT in the last 72 hours. Sepsis Labs: No results for input(s): PROCALCITON, LATICACIDVEN in the last 168 hours.  Recent  Results (from the past 240 hour(s))  SARS Coronavirus 2 (CEPHEID - Performed in [facility] hospital lab), Hosp Order     Status: None   Collection Time: 03/11/19  8:55 PM  Result Value Ref Range Status   SARS Coronavirus 2 NEGATIVE NEGATIVE Final    Comment: (NOTE) If result is NEGATIVE SARS-CoV-2 target nucleic acids are NOT DETECTED. The SARS-CoV-2 RNA is generally detectable in upper and lower  respiratory specimens during the acute phase of infection. The lowest  concentration of SARS-CoV-2 viral copies this assay can detect is 250  copies / mL. A negative result does not preclude SARS-CoV-2 infection  and should not be used as the sole basis for treatment or other  patient management decisions.  A negative result may  occur with  improper specimen collection / handling, submission of specimen other  than nasopharyngeal swab, presence of viral mutation(s) within the  areas targeted by this assay, and inadequate number of viral copies  (<250 copies / mL). A negative result must be combined with clinical  observations, patient history, and epidemiological information. If result is POSITIVE SARS-CoV-2 target nucleic acids are DETECTED. The SARS-CoV-2 RNA is generally detectable in upper and lower  respiratory specimens dur ing the acute phase of infection.  Positive  results are indicative of active infection with SARS-CoV-2.  Clinical  correlation with patient history and other diagnostic information is  necessary to determine patient infection status.  Positive results do  not rule out bacterial infection or co-infection with other viruses. If result is PRESUMPTIVE POSTIVE SARS-CoV-2 nucleic acids MAY BE PRESENT.   A presumptive positive result was obtained on the submitted specimen  and confirmed on repeat testing.  While 2019 novel coronavirus  (SARS-CoV-2) nucleic acids may be present in the submitted sample  additional confirmatory testing may be necessary for epidemiological  and / or clinical management purposes  to differentiate between  SARS-CoV-2 and other Sarbecovirus currently known to infect humans.  If clinically indicated additional testing with an alternate test  methodology [phone]) is advised. The SARS-CoV-2 RNA is generally  detectable in upper and lower respiratory sp ecimens during the acute  phase of infection. The expected result is Negative. Fact Sheet for Patients:  StrictlyIdeas.no Fact Sheet for Healthcare Providers: BankingDealers.co.za This test is not yet approved or cleared by the [place] FDA and has been authorized for detection and/or diagnosis of SARS-CoV-2 by FDA under an Emergency Use Authorization (EUA).  This  EUA will remain in effect (meaning this test can be used) for the duration of the COVID-19 declaration under Section 564(b)(1) of the Act, 21 U.S.C. section 360bbb-3(b)(1), unless the authorization is terminated or revoked sooner. Performed at [facility], [address] [place], [place] [address]   Aerobic/Anaerobic Culture (surgical/deep wound)     Status: None   Collection Time: 03/15/19  8:15 AM  Result Value Ref Range Status   Specimen Description ABSCESS  Final   Special Requests RETROPHARYNGEAL  Final   Gram Stain NO WBC SEEN NO ORGANISMS SEEN   Final   Culture   Final    RARE Consistent with normal respiratory flora. NO ANAEROBES ISOLATED Performed at [facility], [place] [address] [place], [place] [address]    Report Status 03/20/2019 FINAL  Final         Radiology Studies: No results found.      Scheduled Meds: . buPROPion  150 mg Oral Daily  . enoxaparin (LOVENOX) injection  40 mg Subcutaneous  Q24H  . furosemide  40 mg Intravenous BID  . [START ON 03/22/2019] lidocaine  1 patch Transdermal Q24H  . methylPREDNISolone (SOLU-MEDROL) injection  60 mg Intravenous Q8H   Continuous Infusions:    LOS: 10 days    Time spent: 35 mins.More than 50% of that time was spent in counseling and/or coordination of care.      Shelly Coss, MD [facility] Pager [phone]  If 7PM-7AM, please contact night-coverage [email or web address] Password [email or web address] 03/21/2019, 11:18 AM

## 2019-03-22 ENCOUNTER — Ambulatory Visit
Admit: 2019-03-22 | Discharge: 2019-03-22 | Disposition: A | Discharge status: Home / Self Care | Payer: BC Managed Care – PPO | Attending: Radiation Oncology | Admitting: Radiation Oncology

## 2019-03-22 LAB — BASIC METABOLIC PANEL
Anion gap: 9 (ref 5–15)
BUN: 40 mg/dL — ABNORMAL HIGH (ref 6–20)
CO2: 26 mmol/L (ref 22–32)
Calcium: 9.9 mg/dL (ref 8.9–10.3)
Chloride: 101 mmol/L (ref 98–111)
Creatinine, Ser: 1.18 mg/dL — ABNORMAL HIGH (ref 0.44–1.00)
GFR calc Af Amer: >60 mL/min (ref >60)
GFR calc non Af Amer: 58 mL/min — ABNORMAL LOW (ref >60)
Glucose, Bld: 160 mg/dL — ABNORMAL HIGH (ref 70–99)
Potassium: 4.5 mmol/L (ref 3.5–5.1)
Sodium: 136 mmol/L (ref 135–145)

## 2019-03-22 LAB — CBC WITH DIFFERENTIAL/PLATELET
Abs Immature Granulocytes: 0.14 10*3/uL — ABNORMAL HIGH (ref 0.00–0.07)
Basophils Absolute: 0 10*3/uL (ref 0.0–0.1)
Basophils Relative: 0 %
Eosinophils Absolute: 0 10*3/uL (ref 0.0–0.5)
Eosinophils Relative: 0 %
HCT: 43.7 % (ref 36.0–46.0)
Hemoglobin: 13.7 g/dL (ref 12.0–15.0)
Immature Granulocytes: 1 %
Lymphocytes Relative: 9 %
Lymphs Abs: 1.2 10*3/uL (ref 0.7–4.0)
MCH: 28.1 pg (ref 26.0–34.0)
MCHC: 31.4 g/dL (ref 30.0–36.0)
MCV: 89.7 fL (ref 80.0–100.0)
Monocytes Absolute: 0.6 10*3/uL (ref 0.1–1.0)
Monocytes Relative: 5 %
Neutro Abs: 11.6 10*3/uL — ABNORMAL HIGH (ref 1.7–7.7)
Neutrophils Relative %: 85 %
Platelets: 356 10*3/uL (ref 150–400)
RBC: 4.87 MIL/uL (ref 3.87–5.11)
RDW: 15.4 % (ref 11.5–15.5)
WBC: 13.6 10*3/uL — ABNORMAL HIGH (ref 4.0–10.5)
nRBC: 0 % (ref 0.0–0.2)

## 2019-03-22 LAB — GLUCOSE, CAPILLARY
Glucose-Capillary: 155 mg/dL — ABNORMAL HIGH (ref 70–99)
Glucose-Capillary: 188 mg/dL — ABNORMAL HIGH (ref 70–99)

## 2019-03-22 MED ORDER — FUROSEMIDE 40 MG PO TABS: 40.0000 mg | ORAL_TABLET | Freq: Every day | ORAL | 0 refills | Status: DC

## 2019-03-22 MED ORDER — LIDOCAINE 5 % EX PTCH: 1.0000 | MEDICATED_PATCH | CUTANEOUS | 0 refills | Status: DC

## 2019-03-22 NOTE — Plan of Care (Signed)
Encouraged patient to talk about the things that cause her to be anxious to help to get it out versus holding her feelings inside. Patient spoke about her feelings of difficulty to have to explain to her 62 year son that she has cancer, especially after his dad died of cancer in 10-30-2018. Patient does have support and feels beeter after talking about it.

## 2019-03-22 NOTE — Progress Notes (Signed)
[facility] Therapy Treatment Record Phone [phone]   Radiation Therapy was administered to Nani Skillern on: 03/22/2019  1:24 PM and was treatment # 3 out of a planned course of 10 treatments.  Radiation Treatment  1). Beam photons with 6-10 energy  2). Brachytherapy None  3). Stereotactic Radiosurgery None  4). Other Radiation None     Kanisha Duba F Leighla Chestnutt, RT (T)

## 2019-03-22 NOTE — Progress Notes (Signed)
Notified by tele, patient's HR down in 30s, patient had 2.92 sec pause. Asymptomatic.  Will notfiy on call.

## 2019-03-22 NOTE — Progress Notes (Signed)
Nutrition Brief Note RD working remotely.   Patient identified for LOS (day #11).  Wt Readings from Last 15 Encounters:  03/12/19 132 kg  03/05/19 131.5 kg  03/04/19 131.5 kg    Body mass index is 46.97 kg/m. Patient meets criteria for morbid obesity based on current BMI.   Current diet order is Soft and patient has mainly been eating 75-100% of meals for at least the past 1 week. Labs and medications reviewed.   Plan is for radiation today and then likely d/c after today's treatment. Patient had biopsy which showed squamous cell carcinoma.   No nutrition interventions warranted at this time. If nutrition issues arise, please consult RD.     Jarome Matin, MS, RD, LDN, [facility] Inpatient Clinical Dietitian Pager # [phone] After hours/weekend pager # [phone]

## 2019-03-22 NOTE — Progress Notes (Signed)
I've discussed the patient's case with oncology. It appears that from a respiratory standpoint she's been stable through the weekend. We plan to treat with XRT this afternoon at 1:15 pm. We will continue treatment as an outpt when she's medically fit for discharge per hospitalist team.     Carola Rhine, PAC

## 2019-03-22 NOTE — Progress Notes (Signed)
Nani Skillern to be D/C'd Home per MD order.  Discussed prescriptions and follow up appointments with the patient. Prescriptions given to patient, medication list explained in detail. Pt verbalized understanding.  Allergies as of 03/22/2019   No Known Allergies     Medication List    STOP taking these medications   clindamycin 300 MG capsule Commonly known as:  CLEOCIN     TAKE these medications   buPROPion 150 MG 24 hr tablet Commonly known as:  WELLBUTRIN XL Take 150 mg by mouth daily.   furosemide 40 MG tablet Commonly known as:  Lasix Take 1 tablet (40 mg total) by mouth daily for 14 days.   ibuprofen 400 MG tablet Commonly known as:  ADVIL Take 800 mg by mouth every 6 (six) hours as needed.   lidocaine 5 % Commonly known as:  LIDODERM Place 1 patch onto the skin daily. Remove & Discard patch within 12 hours or as directed by MD       Vitals:   03/21/19 1257 03/21/19 2225  BP: (!) 139/92 129/90  Pulse: 97 (!) 106  Resp: 18 20  Temp: 98.8 F (37.1 C) 98 F (36.7 C)  SpO2: 94% 96%    Skin clean, dry and intact without evidence of skin break down, no evidence of skin tears noted. IV catheter discontinued intact. Site without signs and symptoms of complications. Dressing and pressure applied. Pt denies pain at this time. No complaints noted.  An After Visit Summary was printed and given to the patient. Patient escorted via [place], and D/C home via private auto.  Lolita Rieger 03/22/2019 2:32 PM

## 2019-03-22 NOTE — Discharge Summary (Signed)
Physician Discharge Summary  Tara Aguilar DGL:[id number] DOB: 01/19/1981 DOA: 03/11/2019  PCP: [facility]  Admit date: 03/11/2019 Discharge date: 03/22/2019  Admitted From: Home Disposition:  Home  Discharge Condition:Stable CODE STATUS:FULL Diet recommendation:  Regular  Brief/Interim Summary: Patient is a 38 year old female with history of depression, obesity who presented with complaints of progressive dyspnea, swelling of the neck, chest and bilateral upper extremities.  She was recently admitted on 03/05/2019 and was discharged 3 days later after managed for peritonsillar abscess.  She was taking clindamycin at home.  She was admitted for the evaluation of dyspnea.  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  There was concern for malignancy.  She was also found to have continued retropharyngeal causing dysphagia and also found to have significant cervical metastatic lymphadenopathy.  ENT was consulted and she underwent IND of the retropharyngeal effusion .  We are consulted and she underwent biopsy of her lymphadenopathy.  Radiation oncology, oncology consulted.  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 Hospital course remarkable for persistent bilateral upper extremity edema, neck swelling which improved with IV steroids and IV Lasix. She has completed few courses of radiation therapy while as an inpatient.  Now the plan is to follow-up with oncology and radiation oncology as an outpatient. She is hemodynamically stable for discharge today.  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  Now has severe  edema on bilateral upper extremities.  Started on urgent radiation therapy. CTA chest showed very lower right cervical and right superior mediastinal lymphadenopathy largest measuring 5.8 cm. Appears to obstruct of the right brachiocephalic vein and superior vena cava.  Underwent lymph node biopsy by IR.  CT abdomen/pelvis also showed bilateral adrenal nodules.Oncology was following.Undergoing radiation therapy.  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  Oncology following.  Planning for PET scan as an outpatient.  Also planning for MRI of the brain to evaluate for brain metastasis.  Recent retropharyngeal/dental abscess: Recent history of dental extraction.  Continue to have retropharyngeal effusion so underwent I&D by ENT.  She was on  clindamycin.  Completed course  Bilateral upper extremity edema/neck edema: Improving.  This is from her superior vena cava syndrome.  Improved with  Lasix and Solu-Medrol.  She will continue Lasix at home for 1-2 weeks.  Mild acute kidney injury: Most likely secondary to IV Lasix.  Lasix changed to oral daily.  Depression: Continue Wellbutrin  Glucose intolerance.  Hemoglobin A1c of 6.2.  Morbid obesity: BMI of 46.9.  Tobacco abuse: Discussed cessation.  Declined nicotine patch.  Undiagnosed sleep apnea: She never had a sleep study.  Recommend sleep study as an outpatient.  Leukocytosis: Most likely secondary to steroids. Monitor as an outpatient.  Discharge Diagnoses:  Principal Problem:   Mass of mediastinum Active Problems:   Depression   Retropharyngeal abscess   Dental abscess   Glucose intolerance   Obesity, Class III, BMI 40-49.9 (morbid obesity) ([place])   Tobacco use   Difficult airway for intubation    Discharge Instructions  Discharge Instructions    Diet - low sodium heart healthy   Complete by:  As  directed    Discharge instructions   Complete by:  As directed    1) Take prescribed medications as instructed. 2) Follow up with your PCP in a week.Do a BMP test in a week. 3)Follow up with oncology and radiation oncology.   Increase activity slowly   Complete by:  As directed      Allergies as of 03/22/2019   No Known Allergies     Medication List    STOP taking these medications   clindamycin 300 MG capsule Commonly known as:  CLEOCIN     TAKE these medications   buPROPion 150 MG 24 hr tablet Commonly known as:  WELLBUTRIN XL Take 150 mg by mouth daily.   furosemide 40 MG tablet Commonly known as:  Lasix Take 1 tablet (40 mg total) by mouth daily for 14 days.   ibuprofen 400 MG tablet Commonly known as:  ADVIL Take 800 mg by mouth every 6 (six) hours as needed.   lidocaine 5 % Commonly known as:  LIDODERM Place 1 patch onto the skin daily. Remove & Discard patch within 12 hours or as directed by MD      Carlos, [facility]. Schedule an appointment as soon as possible for a visit in 1 week(s).   Specialty:  Internal Medicine Contact information: [address] [place] [address] [phone]          No Known Allergies  Consultations:  Oncology,radiation oncology   Procedures/Studies: Dg Orthopantogram  Result Date: 03/07/2019 CLINICAL DATA:  Right-sided facial swelling. EXAM: ORTHOPANTOGRAM/PANORAMIC COMPARISON:  None. FINDINGS: Advanced decay of the remaining right maxillary molar, including periapical lucency. Other residual dentition does not show visible decay or periodontal disease. IMPRESSION: Advanced decay and periapical lucency affecting the remaining right maxillary molar. Electronically Signed   By: Nelson Chimes M.D.   On: 03/07/2019 11:00   Dg Chest 2 View  Result Date: 03/05/2019 CLINICAL DATA:  Facial swelling and known peritonsillar abscess EXAM: CHEST - 2 VIEW COMPARISON:  None. FINDINGS: Cardiac shadows  within normal limits. Lungs are well aerated bilaterally. Fullness in the right paratracheal region is noted consistent with the lymphadenopathy seen on recent CT of the neck. No focal infiltrate or effusion is seen. No acute bony abnormality is noted. IMPRESSION: Right paratracheal lymphadenopathy similar to that seen on recent CT examination. No other focal abnormality is noted. Electronically Signed   By: Inez Catalina M.D.   On: 03/05/2019 15:58   Ct Soft Tissue Neck W Contrast  Result Date: 03/11/2019 CLINICAL DATA:  Initial evaluation for neck swelling, recent surgery for odontogenic abscess. EXAM: CT NECK WITH CONTRAST TECHNIQUE: Multidetector CT imaging of the neck was performed using the standard protocol following the bolus administration of intravenous contrast. CONTRAST:  141mL OMNIPAQUE IOHEXOL 350 MG/ML SOLN COMPARISON:  Multiple previous CTs from 03/05/2019 and 03/04/2019. FINDINGS: Pharynx and larynx: Oral cavity within normal limits without mass lesion or collection. Since previous exam, there has been interval extraction of the right maxillary central and lateral incisors as well as the adjacent bicuspid. Residual inflammatory stranding overlies the adjacent maxilla with few small foci of gas, likely postsurgical in nature. No residual abscess now seen. Persistent hazy inflammatory stranding inferiorly along the anterior neck, right slightly worse than left, but definitely progressed as compared to previous exam. Associated thickening of the latissimus musculature bilaterally. No new collection within this region. No base of tongue lesion. No discrete tonsillar or peritonsillar abscess. Nasopharynx  within normal limits. There is a persistent fairly sizable retropharyngeal effusion seen throughout the retropharyngeal space, measuring up to 2.5 cm in maximal AP diameter (series 17, image 54). Lateral extension into the carotid spaces bilaterally, with hazy stranding extending towards the lateral  aspects of the neck. Overall effusion is perhaps slightly increased in size from previous. No frank rim enhancement or loculation since previous exam. Supraglottic airway mildly narrowed but remains patent, relatively stable from previous. True cords within normal limits. Subglottic airway clear. Salivary glands: Salivary glands including the parotid and submandibular glands are within normal limits. Thyroid: Thyroid normal. Lymph nodes: Scattered bilateral level II/III lymph nodes again seen, measuring up to 14-15 mm bilaterally. Mildly prominent subcentimeter nodes seen within the right supraclavicular region as well. Few posterior chain nodes noted bilaterally, measuring up to 12 mm on the left (series 14, image 48). Overall, these are relatively similar to previous. Large nodal conglomerate within the right lower neck again seen, measuring 6.4 x 5.7 cm on today's exam. Right paratracheal node partially visualized, measuring 6.1 x 5.0 cm. Vascular: Normal intravascular enhancement seen throughout the neck. Limited intracranial: Unremarkable. Visualized orbits: Visualized globes and orbital soft tissues within normal limits. Mastoids and visualized paranasal sinuses: Small left maxillary and sphenoid sinus retention cyst noted. Mild scattered mucosal thickening noted within the ethmoidal air cells. Visualized paranasal sinuses are otherwise clear. Trace right mastoid effusion noted. Left mastoid air cells clear. Middle ear cavities well pneumatized and clear bilaterally. Skeleton: No acute osseous finding. No discrete lytic or blastic osseous lesions. Upper chest: Better evaluated on concomitant CT of the chest. Other: None. IMPRESSION: 1. Interval extraction of multiple right maxillary teeth. Residual inflammatory stranding with tiny foci of gas overlying the adjacent right maxilla consistent with postsurgical changes and/or residual infection. No residual abscess or drainable fluid collection identified. Hazy  inflammatory stranding within the anterior neck inferiorly compatible with associated cellulitis, progressed relative to 03/05/2019. 2. Persistent retropharyngeal effusion with mild mass effect, slightly increased in size from previous. No new rim enhancement or loculation since previous exam. Supraglottic airway is mildly narrowed but remains patent at this time. 3. Relatively similar cervical and mediastinal lymphadenopathy including large conglomerate nodal masses in the right lower neck and superior mediastinum, again concerning for metastatic disease or lymphoproliferative disorder. Electronically Signed   By: Jeannine Boga M.D.   On: 03/11/2019 18:52   Ct Soft Tissue Neck W Contrast  Result Date: 03/05/2019 CLINICAL DATA:  Worsening facial and neck swelling with difficulty swallowing. EXAM: CT NECK WITH CONTRAST TECHNIQUE: Multidetector CT imaging of the neck was performed using the standard protocol following the bolus administration of intravenous contrast. CONTRAST:  15mL OMNIPAQUE IOHEXOL 300 MG/ML  SOLN COMPARISON:  03/04/2019 FINDINGS: Pharynx and larynx: Symmetric pharyngeal soft tissues without evidence of mass. Diffuse retropharyngeal fluid has increased and now measures up to 2.4 cm in AP thickness at the C3 level with mild mass effect on the posterior oropharynx. No rim enhancement of this fluid. No peritonsillar abscess. Patent airway. Unremarkable larynx. Salivary glands: No inflammation, mass, or stone. Thyroid: Unremarkable. Lymph nodes: Unchanged bilateral cervical lymphadenopathy including level IIa lymph nodes measuring up to 16 mm in short axis on the right and 14 mm on the left as well as a 6.1 x 4.8 cm nodal conglomerate on the right level IV. Similar appearance of partially visualized mediastinal lymphadenopathy including a 6.4 x 4.1 cm right paratracheal mass. Vascular: Major vascular structures in the neck are grossly patent although the  right internal jugular vein appears  compressed in the lower neck by the nodal mass and is poorly visualized near the confluence with the subclavian vein, partly due to patient body habitus. Limited intracranial: Unremarkable. Visualized orbits: Not imaged. Mastoids and visualized paranasal sinuses: Small left maxillary sinus mucous retention cyst. Clear mastoid air cells. Skeleton: Mild lower cervical spondylosis at C6-7. Advanced facet arthrosis in the included thoracic spine. No suspicious osseous lesion. Upper chest: Clear lung apices. Other: Multiple dental caries and periapical lucencies including a prominent periapical lucency involving the right lateral maxillary incisor with disruption of the buccal cortex of the alveolar ridge with an unchanged overlying 8 x 6 mm fluid collection and similar appearance of overlying soft tissue inflammation. Fat stranding more inferiorly in the lower face, right greater than left with asymmetric thickening of the platysma on the right, is stable to slightly increased. IMPRESSION: 1. Odontogenic infection with unchanged 8 x 6 mm subperiosteal abscess associated with the right maxillary incisor. 2. Increased retropharyngeal fluid with mild mass effect. While there is no rim enhancement and a sterile effusion is possible, retropharyngeal abscess is a concern given its size and progression since yesterday. 3. Mildly increased inflammatory stranding/swelling in the lower face and neck without new fluid collection. 4. Unchanged cervical and mediastinal lymphadenopathy including large conglomerate nodal masses in the right lower neck and superior mediastinum concerning for metastatic disease or lymphoproliferative disease. Electronically Signed   By: Logan Bores M.D.   On: 03/05/2019 16:15   Ct Soft Tissue Neck W Contrast  Result Date: 03/04/2019 CLINICAL DATA:  Initial evaluation for acute anterior neck swelling, neck mass. Sore throat. EXAM: CT NECK WITH CONTRAST TECHNIQUE: Multidetector CT imaging of the neck  was performed using the standard protocol following the bolus administration of intravenous contrast. CONTRAST:  46mL OMNIPAQUE IOHEXOL 300 MG/ML  SOLN COMPARISON:  None available. FINDINGS: Pharynx and larynx: Oral cavity within normal limits without discrete mass or loculated fluid collection. Prominent periapical lucency with dehiscence of the overlying alveolar ridge present at the right maxillary lateral incisor. Overlying soft tissue swelling with inflammatory stranding concerning for odontogenic infection. Ill-defined hypodense collection within this region measuring 8 x 12 x 11 mm suspicious for early/developing odontogenic abscess (series 2, image 7). Additional hazy inflammatory stranding seen involving the subcutaneous fat inferiorly within the anterior neck, just inferior to the mandible (series 2, image 53). Associated thickening of the platysmas, right greater than left. Findings suspicious for additional site of infection/cellulitis. No base of tongue lesion. Palatine tonsils symmetric and within normal limits. Nasopharynx within normal limits. Moderate retropharyngeal effusion seen diffusely throughout the retropharyngeal soft tissues. No loculated collection or retropharyngeal abscess. Lateral extension into the carotid spaces bilaterally. Mild mass effect on the hypopharynx anteriorly. Supraglottic airway remains patent at this time. No significant associated mucosal edema. Epiglottis normal. Vallecula clear. Remainder of the hypopharynx and supraglottic larynx within normal limits. True cords symmetric and grossly normal. Subglottic airway clear. Salivary glands: Salivary glands including the parotid and submandibular glands are normal. Thyroid: Thyroid within normal limits. Lymph nodes: Increased number of lymph nodes seen within the neck bilaterally. Superimposed enlarged bilateral level II lymph nodes measure up to 16 mm on the right and 13 mm on the left. Level III nodes measure up to 11 mm  bilaterally. There is an enlarged soft tissue mass at right level IV measuring 6.2 x 4.7 x 4.6 cm, likely a nodal conglomerate (series 2, image 84). An enlarged nodal conglomerate within the right  paratracheal region measures approximately 6.0 x 3.6 x 6.5 cm (series 2, image 117). Few additional scattered enlarged mediastinal lymph nodes noted. Few mildly prominent posterior chain nodes measure up to 9 mm on the right. Vascular: Normal intravascular enhancement seen throughout the neck. Limited intracranial: Unremarkable. Visualized orbits: Not included on this exam. Mastoids and visualized paranasal sinuses: Visualized paranasal sinuses are clear. Mastoid air cells and middle ear cavities are largely well pneumatized and free of fluid. Skeleton: No acute osseous finding. No discrete lytic or blastic osseous lesions. Mild cervical spondylolysis noted at C6-7. Upper chest: Remainder of the partially visualized upper chest demonstrates no other acute finding. Partially visualized lungs are clear. Other: None. IMPRESSION: 1. Hazy inflammatory stranding with swelling involving the subcutaneous fat of the lower anterior neck, just inferior to the mandible, suspicious for possible infection/cellulitis. Moderate diffuse retropharyngeal effusion suspected to be reactive. No discrete retropharyngeal abscess. 2. Prominent periapical lucency about the right maxillary lateral incisor with adjacent inflammatory stranding, concerning for acute odontogenic infection. Superimposed 8 x 12 x 11 mm hypodense collection within this region compatible with early odontogenic abscess. 3. Enlarged nodal conglomerates within the right lower neck and right mediastinum measuring up to 6 cm in diameter. Given size, these are felt to be unlikely be reactive in nature, and are most concerning for possible concomitant lymphoproliferative disorder or nodal metastases. No primary mass lesion seen within the neck. Correlation with histology may be  helpful for further evaluation. Additional prominent bilateral cervical lymph nodes as above are more indeterminate, and could be reactive. Electronically Signed   By: Jeannine Boga M.D.   On: 03/04/2019 19:57   Ct Angio Chest Pe W And/or Wo Contrast  Result Date: 03/11/2019 CLINICAL DATA:  Peritonsillar abscess, neck, right chest, and right breast swelling, neck pain EXAM: CT ANGIOGRAPHY CHEST WITH CONTRAST TECHNIQUE: Multidetector CT imaging of the chest was performed using the standard protocol during bolus administration of intravenous contrast. Multiplanar CT image reconstructions and MIPs were obtained to evaluate the vascular anatomy. CONTRAST:  156mL OMNIPAQUE IOHEXOL 350 MG/ML SOLN COMPARISON:  CT neck, 03/05/2019 FINDINGS: Cardiovascular: Examination for pulmonary embolism is very limited by body habitus and poor contrast bolus, main pulmonary artery HU = 151. Within this limitation, there is no obvious central pulmonary embolism. Evaluation of the lobar and more distal pulmonary arteries is nondiagnostic. There is bulky lymphadenopathy which appears to obstruct the right brachiocephalic vein and superior vena cava. Normal heart size. No pericardial effusion. Mediastinum/Nodes: Redemonstrated, very bulky lower right cervical and right superior mediastinal lymphadenopathy, largest nodes measuring at least 5.8 cm (series 6, image 27). Thyroid gland, trachea, and esophagus demonstrate no significant findings. Lungs/Pleura: Probable right basilar atelectasis. No pleural effusion or pneumothorax. Upper Abdomen: No acute abnormality. Bilateral adrenal nodules measuring approximately 2.1 cm on the left and 1.3 cm on the right. Musculoskeletal: No chest wall abnormality. No acute or significant osseous findings. Review of the MIP images confirms the above findings. IMPRESSION: 1. Examination for pulmonary embolism is very limited by body habitus and poor contrast bolus, main pulmonary artery HU = 151.  Within this limitation, there is no obvious central pulmonary embolism. Evaluation of the lobar and more distal pulmonary arteries is nondiagnostic. 2. Redemonstrated, very bulky lower right cervical and right superior mediastinal lymphadenopathy, largest nodes measuring at least 5.8 cm (series 6, image 27). This appears to obstruct the right brachiocephalic vein and superior vena cava. Findings are highly suspicious for malignancy, including lymphoma and metastatic disease. 3. Bilateral  nonspecific soft tissue adrenal nodules, concerning for metastatic disease although incompletely characterized. 4. Probable right basilar atelectasis without definite acute airspace disease. Electronically Signed   By: Eddie Candle M.D.   On: 03/11/2019 18:34   Ct Abdomen Pelvis W Contrast  Result Date: 03/12/2019 CLINICAL DATA:  38 year old with bulky lymphadenopathy in the RIGHT LOWER neck and in the RIGHT superior mediastinum. Evaluate for abdominopelvic lymphadenopathy. EXAM: CT ABDOMEN AND PELVIS WITH CONTRAST TECHNIQUE: Multidetector CT imaging of the abdomen and pelvis was performed using the standard protocol following bolus administration of intravenous contrast. CONTRAST:  155mL OMNIPAQUE IOHEXOL 300 MG/ML IV. Oral contrast was also administered. COMPARISON:  No prior abdominopelvic CT. CTA chest 03/11/2019 is correlated. FINDINGS: Lower chest: Dense consolidation dependently in the POSTERIOR RIGHT LOWER LOBE. Visualized lung bases otherwise clear. Normal heart size. Hepatobiliary: Borderline hepatomegaly. No focal hepatic parenchymal abnormality. Normal anatomic variant in that the LEFT lobe of the liver crosses the midline into the LEFT UPPER QUADRANT. Gallbladder normal in appearance without calcified gallstones. No biliary ductal dilation. Pancreas: Normal in appearance without evidence of mass, ductal dilation, or inflammation. Spleen: Normal in size and appearance. Adrenals/Urinary Tract: BILATERAL adrenal  masses, that on the LEFT measuring approximately 1.6 x 2.5 x 2.2 cm and that on the RIGHT measuring approximately 1.3 x 2.1 x 2.7 cm. Kidneys normal in size and appearance without focal parenchymal abnormality. No hydronephrosis. No evidence of urinary tract calculi. Normal appearing urinary bladder. Stomach/Bowel: Stomach normal in appearance for the degree of distention. Normal-appearing small bowel. Moderate stool burden throughout the normal appearing colon. Normal appendix in the RIGHT UPPER pelvis filled with oral contrast. Vascular/Lymphatic: Minimal atherosclerosis involving the RIGHT common iliac artery. No evidence of abdominal aortic atherosclerosis or aneurysm. Normal-appearing portal venous and systemic venous systems. 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 No pathologic lymphadenopathy within the abdomen or pelvis. Reproductive: Normal-appearing uterus and ovaries without evidence of adnexal mass. Other: Edema involving the LATERAL subcutaneous tissues of the LOWER chest and upper abdomen. Musculoskeletal: Degenerative disc disease and spondylosis involving the visualized thoracic spine. Chronic L5-S1 disc protrusion with calcification in the POSTERIOR annular fibers. No acute findings. IMPRESSION: 1. No acute abnormalities involving the abdomen or pelvis. 2. Borderline hepatomegaly without focal hepatic parenchymal abnormality. 3. Bilateral adrenal masses, measured above. Given the fact that the patient may have a malignancy, a dedicated CT of the abdomen without and with contrast (adrenal protocol) is recommended in further evaluation. This recommendation follows ACR consensus guidelines: Managing Incidental Findings on Abdominal CT: White Paper of the ACR Incidental Findings Committee. J Am Coll Radiol 2010;7:754-773. Electronically Signed   By: Evangeline Dakin M.D.   On: 03/12/2019 21:49   Ct Abdomen  W Wo Contrast  Result Date: 03/16/2019 CLINICAL DATA:  Bilateral adrenal nodules. EXAM: CT ABDOMEN WITHOUT AND WITH CONTRAST TECHNIQUE: Multidetector CT imaging of the abdomen was performed following the standard protocol before and following the bolus administration of intravenous contrast. CONTRAST:  123mL OMNIPAQUE IOHEXOL 300 MG/ML  SOLN COMPARISON:  03/12/2019 FINDINGS: Lower chest: Right lower lobe collapse/consolidation. Hepatobiliary: Wedge-shaped area of altered perfusion noted in the lateral segment left liver along the falciform ligament. No suspicious focal abnormality within the liver parenchyma. Gallbladder decompressed. No intrahepatic or extrahepatic biliary dilation. Pancreas: No focal mass lesion. No dilatation of the main duct. No intraparenchymal cyst. No peripancreatic edema. Spleen: No splenomegaly. No focal mass lesion. Adrenals/Urinary Tract: 2.1  cm right adrenal nodule again noted. Absolute attenuation of this nodule is too high to allow classification as adenoma. Lesion shows no substantial enhancement after IV contrast administration and cannot be definitively characterized based on absolute washout or relative washout characteristics. 2.5 cm left adrenal nodule also has attenuation too high on precontrast imaging to allow characterization as an adenoma. Absolute washout and relative washout characteristics of this nodule or indeterminate. Kidneys unremarkable. Stomach/Bowel: Stomach is unremarkable. No gastric wall thickening. No evidence of outlet obstruction. Duodenum is normally positioned as is the ligament of Treitz. Small bowel loops and colonic segments of the abdomen are nondilated. Vascular/Lymphatic: No abdominal aortic aneurysm. There is no gastrohepatic or hepatoduodenal ligament lymphadenopathy. No intraperitoneal or retroperitoneal lymphadenopathy. Other: No intraperitoneal free fluid. Musculoskeletal: No worrisome lytic or sclerotic osseous abnormality. IMPRESSION: 1.  Bilateral adrenal nodules as noted on recent CT scan. These nodules cannot be characterized by either absolute attenuation or washout characteristics. If these represent lipid poor adenomas, MRI of the abdomen may be able to further characterize. Given the history of thoracic lymphadenopathy, close follow-up recommended. Electronically Signed   By: Misty Stanley M.D.   On: 03/16/2019 16:47   [place] Core Biopsy (lymph Nodes)  Result Date: 03/14/2019 INDICATION: 38 year old with cervical and mediastinal lymphadenopathy. Recent tooth extractions for dental abscess. Patient also has a retropharyngeal effusion. Plan for ultrasound-guided biopsy of the right cervical lymphadenopathy. EXAM: ULTRASOUND-GUIDED RIGHT SUPRACLAVICULAR LYMPH NODE BIOPSY MEDICATIONS: None. ANESTHESIA/SEDATION: Moderate (conscious) sedation was employed during this procedure. A total of Versed 2.0 mg and Fentanyl 100 mcg was administered intravenously. Moderate Sedation Time: 15 minutes the patient's level of consciousness and vital signs were monitored continuously by radiology nursing throughout the procedure under my direct supervision. FLUOROSCOPY TIME:  None COMPLICATIONS: None immediate. PROCEDURE: Informed written consent was obtained from the patient after a thorough discussion of the procedural risks, benefits and alternatives. A timeout was performed prior to the initiation of the procedure. Right side of the neck was prepped with chlorhexidine and sterile field was created. Skin was anesthetized with 1% lidocaine. Using ultrasound guidance, 18 gauge core needle was directed into the large nodal mass in the right supraclavicular area. A total of 6 core biopsies were obtained with an 18 gauge core device. Specimens placed in saline. Bandage placed over the puncture site. FINDINGS: Large lymph nodes on both sides of the neck. Large irregular nodal mass in the right supraclavicular area was targeted for biopsy. Needle position was confirmed  within this nodal mass. No evidence for bleeding or hematoma formation at the end of the procedure. Visualized right internal jugular vein is small and does not completely compress. Evidence for nonocclusive thrombus in the right internal jugular vein. Findings are similar to the recent CT. IMPRESSION: Ultrasound-guided core biopsies of the right supraclavicular nodal mass. Abnormal appearance of the right internal jugular vein. Findings are compatible with nonocclusive thrombus of unknown age. Findings are similar to the recent CT and could be secondary from the inflammatory changes in the neck and compression from the large supraclavicular nodal mass. Electronically Signed   By: Markus Daft M.D.   On: 03/14/2019 17:13       Subjective: Patient seen and examined the bedside this morning.  Remains comfortable.  Hemodynamically stable.  Waiting for radiation therapy today.  Stable for discharge.  Discharge Exam: Vitals:   03/21/19 1257 03/21/19 2225  BP: (!) 139/92 129/90  Pulse: 97 (!) 106  Resp: 18 20  Temp: 98.8 F (37.1 C)  98 F (36.7 C)  SpO2: 94% 96%   Vitals:   03/20/19 2030 03/21/19 0542 03/21/19 1257 03/21/19 2225  BP: (!) 141/87 (!) 133/91 (!) 139/92 129/90  Pulse: (!) 110 95 97 (!) 106  Resp: 20 20 18 20   Temp: 97.7 F (36.5 C) 97.6 F (36.4 C) 98.8 F (37.1 C) 98 F (36.7 C)  TempSrc: Oral Oral Oral Oral  SpO2: 96% 92% 94% 96%  Weight:      Height:        General: Pt is alert, awake, not in acute distress,obese Cardiovascular: RRR, S1/S2 +, no rubs, no gallops Respiratory: CTA bilaterally, no wheezing, no rhonchi Abdominal: Soft, NT, ND, bowel sounds + Extremities: Edema of the bilateral upper extremities more on the left, neck edema, upper chest edema    The results of significant diagnostics from this hospitalization (including imaging, microbiology, ancillary and laboratory) are listed below for reference.     Microbiology: Recent Results (from the past  240 hour(s))  Aerobic/Anaerobic Culture (surgical/deep wound)     Status: None   Collection Time: 03/15/19  8:15 AM  Result Value Ref Range Status   Specimen Description ABSCESS  Final   Special Requests RETROPHARYNGEAL  Final   Gram Stain NO WBC SEEN NO ORGANISMS SEEN   Final   Culture   Final    RARE Consistent with normal respiratory flora. NO ANAEROBES ISOLATED Performed at [facility], [place] [address], [place], [place] [address]    Report Status 03/20/2019 FINAL  Final     Labs: BNP (last 3 results) Recent Labs    03/12/19 0934  BNP 78.2   Basic Metabolic Panel: Recent Labs  Lab 03/16/19 0809 03/17/19 0245 03/18/19 0634 03/21/19 0535 03/22/19 0532  NA 138 138  --  138 136  K 4.2 4.1  --  4.5 4.5  CL 105 104  --  101 101  CO2 25 26  --  27 26  GLUCOSE 124* 113*  --  144* 160*  BUN 12 16  --  34* 40*  CREATININE 0.97 1.12* 1.04* 1.15* 1.18*  CALCIUM 9.5 9.4  --  10.4* 9.9  MG 2.1 1.9  --   --   --    Liver Function Tests: No results for input(s): AST, ALT, ALKPHOS, BILITOT, PROT, ALBUMIN in the last 168 hours. No results for input(s): LIPASE, AMYLASE in the last 168 hours. No results for input(s): AMMONIA in the last 168 hours. CBC: Recent Labs  Lab 03/16/19 0809 03/17/19 0245 03/21/19 0535 03/22/19 0532  WBC 13.4* 10.1 17.3* 13.6*  NEUTROABS  --   --  15.3* 11.6*  HGB 13.5 12.7 13.5 13.7  HCT 41.7 39.4 43.8 43.7  MCV 86.7 87.9 90.3 89.7  PLT 293 276 387 356   Cardiac Enzymes: No results for input(s): CKTOTAL, CKMB, CKMBINDEX, TROPONINI in the last 168 hours. BNP: Invalid input(s): POCBNP CBG: Recent Labs  Lab 03/20/19 2354 03/21/19 0742 03/21/19 1618 03/22/19 0008 03/22/19 0757  GLUCAP 135* 155* 169* 188* 155*   D-Dimer No results for input(s): DDIMER in the last 72 hours. Hgb A1c No results for input(s): HGBA1C in the last 72 hours. Lipid Profile No results for input(s): CHOL, HDL, LDLCALC, TRIG, CHOLHDL, LDLDIRECT in the last  72 hours. Thyroid function studies No results for input(s): TSH, T4TOTAL, T3FREE, THYROIDAB in the last 72 hours.  Invalid input(s): FREET3 Anemia work up No results for input(s): VITAMINB12, FOLATE, FERRITIN, TIBC, IRON, RETICCTPCT in the last  72 hours. Urinalysis No results found for: COLORURINE, APPEARANCEUR, [place], [place], [place], [place], [place], [place], PROTEINUR, UROBILINOGEN, NITRITE, LEUKOCYTESUR Sepsis Labs Invalid input(s): PROCALCITONIN,  WBC,  LACTICIDVEN Microbiology Recent Results (from the past 240 hour(s))  Aerobic/Anaerobic Culture (surgical/deep wound)     Status: None   Collection Time: 03/15/19  8:15 AM  Result Value Ref Range Status   Specimen Description ABSCESS  Final   Special Requests RETROPHARYNGEAL  Final   Gram Stain NO WBC SEEN NO ORGANISMS SEEN   Final   Culture   Final    RARE Consistent with normal respiratory flora. NO ANAEROBES ISOLATED Performed at [facility], [place] [address], [place], [place] [address]    Report Status 03/20/2019 FINAL  Final    Please note: You were cared for by a hospitalist during your hospital stay. Once you are discharged, your primary care physician will handle any further medical issues. Please note that NO REFILLS for any discharge medications will be authorized once you are discharged, as it is imperative that you return to your primary care physician (or establish a relationship with a primary care physician if you do not have one) for your post hospital discharge needs so that they can reassess your need for medications and monitor your lab values.    Time coordinating discharge: 40 minutes  SIGNED:   Shelly Coss, MD  [facility] 03/22/2019, 10:51 AM Pager [phone]  If 7PM-7AM, please contact night-coverage [email or web address] Password [email or web address]

## 2019-03-22 NOTE — Progress Notes (Addendum)
HEMATOLOGY-ONCOLOGY PROGRESS NOTE  SUBJECTIVE: Facial swelling is improving.  Right arm swelling has improved as well.  Denies chest discomfort.  Had some shortness of breath over the weekend and is on oral Lasix.  She has no other complaints this morning.  REVIEW OF SYSTEMS:   Constitutional: Denies fevers, chills or abnormal weight loss Eyes: Denies blurriness of vision Ears, nose, mouth, throat, and face: Denies mucositis or sore throat Respiratory: Reports shortness of breath with exertion. Cardiovascular: Denies palpitation, chest discomfort.  Has facial and right arm swelling; improving Gastrointestinal:  Denies nausea, heartburn or change in bowel habits Skin: Denies abnormal skin rashes Lymphatics: Denies new lymphadenopathy or easy bruising Neurological:Denies numbness, tingling or new weaknesses Behavioral/Psych: Mood is stable, no new changes  Extremities: No lower extremity edema All other systems were reviewed with the patient and are negative.  I have reviewed the past medical history, past surgical history, social history and family history with the patient and they are unchanged from previous note.   PHYSICAL EXAMINATION: ECOG PERFORMANCE STATUS: 1 - Symptomatic but completely ambulatory  Vitals:   03/21/19 1257 03/21/19 2225  BP: (!) 139/92 129/90  Pulse: 97 (!) 106  Resp: 18 20  Temp: 98.8 F (37.1 C) 98 F (36.7 C)  SpO2: 94% 96%   Filed Weights   03/11/19 1509 03/12/19 0030  Weight: 290 lb (131.5 kg) 291 lb (132 kg)    Intake/Output from previous day: 05/25 0701 - 05/26 0700 In: 640 [P.O.:640] Out: -   GENERAL:alert, no distress and comfortable SKIN: skin color, texture, turgor are normal, no rashes or significant lesions HEAD/NECK: Ongoing facial and neck swelling.  No palpable adenopathy. EYES: normal, Conjunctiva are pink and non-injected, sclera clear OROPHARYNX:no exudate, no erythema and lips, buccal mucosa, and tongue normal  LUNGS: clear to  auscultation and percussion with normal breathing effort HEART: regular rate & rhythm and no murmurs and no lower extremity edema.  Right upper extremity swelling has improved. ABDOMEN:abdomen soft, non-tender and normal bowel sounds Musculoskeletal:no cyanosis of digits and no clubbing  NEURO: alert & oriented x 3 with fluent speech, no focal motor/sensory deficits  LABORATORY DATA:  I have reviewed the data as listed CMP Latest Ref Rng & Units 03/22/2019 03/21/2019 03/18/2019  Glucose 70 - 99 mg/dL 160(H) 144(H) -  BUN 6 - 20 mg/dL 40(H) 34(H) -  Creatinine 0.44 - 1.00 mg/dL 1.18(H) 1.15(H) 1.04(H)  Sodium 135 - 145 mmol/L 136 138 -  Potassium 3.5 - 5.1 mmol/L 4.5 4.5 -  Chloride 98 - 111 mmol/L 101 101 -  CO2 22 - 32 mmol/L 26 27 -  Calcium 8.9 - 10.3 mg/dL 9.9 10.4(H) -  Total Protein 6.5 - 8.1 g/dL - - -  Total Bilirubin 0.3 - 1.2 mg/dL - - -  Alkaline Phos 38 - 126 U/L - - -  AST 15 - 41 U/L - - -  ALT 0 - 44 U/L - - -    Lab Results  Component Value Date   WBC 13.6 (H) 03/22/2019   HGB 13.7 03/22/2019   HCT 43.7 03/22/2019   MCV 89.7 03/22/2019   PLT 356 03/22/2019   NEUTROABS 11.6 (H) 03/22/2019    Dg Orthopantogram  Result Date: 03/07/2019 CLINICAL DATA:  Right-sided facial swelling. EXAM: ORTHOPANTOGRAM/PANORAMIC COMPARISON:  None. FINDINGS: Advanced decay of the remaining right maxillary molar, including periapical lucency. Other residual dentition does not show visible decay or periodontal disease. IMPRESSION: Advanced decay and periapical lucency affecting the remaining right  maxillary molar. Electronically Signed   By: Nelson Chimes M.D.   On: 03/07/2019 11:00   Dg Chest 2 View  Result Date: 03/05/2019 CLINICAL DATA:  Facial swelling and known peritonsillar abscess EXAM: CHEST - 2 VIEW COMPARISON:  None. FINDINGS: Cardiac shadows within normal limits. Lungs are well aerated bilaterally. Fullness in the right paratracheal region is noted consistent with the  lymphadenopathy seen on recent CT of the neck. No focal infiltrate or effusion is seen. No acute bony abnormality is noted. IMPRESSION: Right paratracheal lymphadenopathy similar to that seen on recent CT examination. No other focal abnormality is noted. Electronically Signed   By: Inez Catalina M.D.   On: 03/05/2019 15:58   Ct Soft Tissue Neck W Contrast  Result Date: 03/11/2019 CLINICAL DATA:  Initial evaluation for neck swelling, recent surgery for odontogenic abscess. EXAM: CT NECK WITH CONTRAST TECHNIQUE: Multidetector CT imaging of the neck was performed using the standard protocol following the bolus administration of intravenous contrast. CONTRAST:  169mL OMNIPAQUE IOHEXOL 350 MG/ML SOLN COMPARISON:  Multiple previous CTs from 03/05/2019 and 03/04/2019. FINDINGS: Pharynx and larynx: Oral cavity within normal limits without mass lesion or collection. Since previous exam, there has been interval extraction of the right maxillary central and lateral incisors as well as the adjacent bicuspid. Residual inflammatory stranding overlies the adjacent maxilla with few small foci of gas, likely postsurgical in nature. No residual abscess now seen. Persistent hazy inflammatory stranding inferiorly along the anterior neck, right slightly worse than left, but definitely progressed as compared to previous exam. Associated thickening of the latissimus musculature bilaterally. No new collection within this region. No base of tongue lesion. No discrete tonsillar or peritonsillar abscess. Nasopharynx within normal limits. There is a persistent fairly sizable retropharyngeal effusion seen throughout the retropharyngeal space, measuring up to 2.5 cm in maximal AP diameter (series 17, image 54). Lateral extension into the carotid spaces bilaterally, with hazy stranding extending towards the lateral aspects of the neck. Overall effusion is perhaps slightly increased in size from previous. No frank rim enhancement or loculation  since previous exam. Supraglottic airway mildly narrowed but remains patent, relatively stable from previous. True cords within normal limits. Subglottic airway clear. Salivary glands: Salivary glands including the parotid and submandibular glands are within normal limits. Thyroid: Thyroid normal. Lymph nodes: Scattered bilateral level II/III lymph nodes again seen, measuring up to 14-15 mm bilaterally. Mildly prominent subcentimeter nodes seen within the right supraclavicular region as well. Few posterior chain nodes noted bilaterally, measuring up to 12 mm on the left (series 14, image 48). Overall, these are relatively similar to previous. Large nodal conglomerate within the right lower neck again seen, measuring 6.4 x 5.7 cm on today's exam. Right paratracheal node partially visualized, measuring 6.1 x 5.0 cm. Vascular: Normal intravascular enhancement seen throughout the neck. Limited intracranial: Unremarkable. Visualized orbits: Visualized globes and orbital soft tissues within normal limits. Mastoids and visualized paranasal sinuses: Small left maxillary and sphenoid sinus retention cyst noted. Mild scattered mucosal thickening noted within the ethmoidal air cells. Visualized paranasal sinuses are otherwise clear. Trace right mastoid effusion noted. Left mastoid air cells clear. Middle ear cavities well pneumatized and clear bilaterally. Skeleton: No acute osseous finding. No discrete lytic or blastic osseous lesions. Upper chest: Better evaluated on concomitant CT of the chest. Other: None. IMPRESSION: 1. Interval extraction of multiple right maxillary teeth. Residual inflammatory stranding with tiny foci of gas overlying the adjacent right maxilla consistent with postsurgical changes and/or residual infection. No residual  abscess or drainable fluid collection identified. Hazy inflammatory stranding within the anterior neck inferiorly compatible with associated cellulitis, progressed relative to 03/05/2019.  2. Persistent retropharyngeal effusion with mild mass effect, slightly increased in size from previous. No new rim enhancement or loculation since previous exam. Supraglottic airway is mildly narrowed but remains patent at this time. 3. Relatively similar cervical and mediastinal lymphadenopathy including large conglomerate nodal masses in the right lower neck and superior mediastinum, again concerning for metastatic disease or lymphoproliferative disorder. Electronically Signed   By: Jeannine Boga M.D.   On: 03/11/2019 18:52   Ct Soft Tissue Neck W Contrast  Result Date: 03/05/2019 CLINICAL DATA:  Worsening facial and neck swelling with difficulty swallowing. EXAM: CT NECK WITH CONTRAST TECHNIQUE: Multidetector CT imaging of the neck was performed using the standard protocol following the bolus administration of intravenous contrast. CONTRAST:  61mL OMNIPAQUE IOHEXOL 300 MG/ML  SOLN COMPARISON:  03/04/2019 FINDINGS: Pharynx and larynx: Symmetric pharyngeal soft tissues without evidence of mass. Diffuse retropharyngeal fluid has increased and now measures up to 2.4 cm in AP thickness at the C3 level with mild mass effect on the posterior oropharynx. No rim enhancement of this fluid. No peritonsillar abscess. Patent airway. Unremarkable larynx. Salivary glands: No inflammation, mass, or stone. Thyroid: Unremarkable. Lymph nodes: Unchanged bilateral cervical lymphadenopathy including level IIa lymph nodes measuring up to 16 mm in short axis on the right and 14 mm on the left as well as a 6.1 x 4.8 cm nodal conglomerate on the right level IV. Similar appearance of partially visualized mediastinal lymphadenopathy including a 6.4 x 4.1 cm right paratracheal mass. Vascular: Major vascular structures in the neck are grossly patent although the right internal jugular vein appears compressed in the lower neck by the nodal mass and is poorly visualized near the confluence with the subclavian vein, partly due to  patient body habitus. Limited intracranial: Unremarkable. Visualized orbits: Not imaged. Mastoids and visualized paranasal sinuses: Small left maxillary sinus mucous retention cyst. Clear mastoid air cells. Skeleton: Mild lower cervical spondylosis at C6-7. Advanced facet arthrosis in the included thoracic spine. No suspicious osseous lesion. Upper chest: Clear lung apices. Other: Multiple dental caries and periapical lucencies including a prominent periapical lucency involving the right lateral maxillary incisor with disruption of the buccal cortex of the alveolar ridge with an unchanged overlying 8 x 6 mm fluid collection and similar appearance of overlying soft tissue inflammation. Fat stranding more inferiorly in the lower face, right greater than left with asymmetric thickening of the platysma on the right, is stable to slightly increased. IMPRESSION: 1. Odontogenic infection with unchanged 8 x 6 mm subperiosteal abscess associated with the right maxillary incisor. 2. Increased retropharyngeal fluid with mild mass effect. While there is no rim enhancement and a sterile effusion is possible, retropharyngeal abscess is a concern given its size and progression since yesterday. 3. Mildly increased inflammatory stranding/swelling in the lower face and neck without new fluid collection. 4. Unchanged cervical and mediastinal lymphadenopathy including large conglomerate nodal masses in the right lower neck and superior mediastinum concerning for metastatic disease or lymphoproliferative disease. Electronically Signed   By: Logan Bores M.D.   On: 03/05/2019 16:15   Ct Soft Tissue Neck W Contrast  Result Date: 03/04/2019 CLINICAL DATA:  Initial evaluation for acute anterior neck swelling, neck mass. Sore throat. EXAM: CT NECK WITH CONTRAST TECHNIQUE: Multidetector CT imaging of the neck was performed using the standard protocol following the bolus administration of intravenous contrast. CONTRAST:  24mL OMNIPAQUE  IOHEXOL 300 MG/ML  SOLN COMPARISON:  None available. FINDINGS: Pharynx and larynx: Oral cavity within normal limits without discrete mass or loculated fluid collection. Prominent periapical lucency with dehiscence of the overlying alveolar ridge present at the right maxillary lateral incisor. Overlying soft tissue swelling with inflammatory stranding concerning for odontogenic infection. Ill-defined hypodense collection within this region measuring 8 x 12 x 11 mm suspicious for early/developing odontogenic abscess (series 2, image 7). Additional hazy inflammatory stranding seen involving the subcutaneous fat inferiorly within the anterior neck, just inferior to the mandible (series 2, image 53). Associated thickening of the platysmas, right greater than left. Findings suspicious for additional site of infection/cellulitis. No base of tongue lesion. Palatine tonsils symmetric and within normal limits. Nasopharynx within normal limits. Moderate retropharyngeal effusion seen diffusely throughout the retropharyngeal soft tissues. No loculated collection or retropharyngeal abscess. Lateral extension into the carotid spaces bilaterally. Mild mass effect on the hypopharynx anteriorly. Supraglottic airway remains patent at this time. No significant associated mucosal edema. Epiglottis normal. Vallecula clear. Remainder of the hypopharynx and supraglottic larynx within normal limits. True cords symmetric and grossly normal. Subglottic airway clear. Salivary glands: Salivary glands including the parotid and submandibular glands are normal. Thyroid: Thyroid within normal limits. Lymph nodes: Increased number of lymph nodes seen within the neck bilaterally. Superimposed enlarged bilateral level II lymph nodes measure up to 16 mm on the right and 13 mm on the left. Level III nodes measure up to 11 mm bilaterally. There is an enlarged soft tissue mass at right level IV measuring 6.2 x 4.7 x 4.6 cm, likely a nodal conglomerate  (series 2, image 84). An enlarged nodal conglomerate within the right paratracheal region measures approximately 6.0 x 3.6 x 6.5 cm (series 2, image 117). Few additional scattered enlarged mediastinal lymph nodes noted. Few mildly prominent posterior chain nodes measure up to 9 mm on the right. Vascular: Normal intravascular enhancement seen throughout the neck. Limited intracranial: Unremarkable. Visualized orbits: Not included on this exam. Mastoids and visualized paranasal sinuses: Visualized paranasal sinuses are clear. Mastoid air cells and middle ear cavities are largely well pneumatized and free of fluid. Skeleton: No acute osseous finding. No discrete lytic or blastic osseous lesions. Mild cervical spondylolysis noted at C6-7. Upper chest: Remainder of the partially visualized upper chest demonstrates no other acute finding. Partially visualized lungs are clear. Other: None. IMPRESSION: 1. Hazy inflammatory stranding with swelling involving the subcutaneous fat of the lower anterior neck, just inferior to the mandible, suspicious for possible infection/cellulitis. Moderate diffuse retropharyngeal effusion suspected to be reactive. No discrete retropharyngeal abscess. 2. Prominent periapical lucency about the right maxillary lateral incisor with adjacent inflammatory stranding, concerning for acute odontogenic infection. Superimposed 8 x 12 x 11 mm hypodense collection within this region compatible with early odontogenic abscess. 3. Enlarged nodal conglomerates within the right lower neck and right mediastinum measuring up to 6 cm in diameter. Given size, these are felt to be unlikely be reactive in nature, and are most concerning for possible concomitant lymphoproliferative disorder or nodal metastases. No primary mass lesion seen within the neck. Correlation with histology may be helpful for further evaluation. Additional prominent bilateral cervical lymph nodes as above are more indeterminate, and could be  reactive. Electronically Signed   By: Jeannine Boga M.D.   On: 03/04/2019 19:57   Ct Angio Chest Pe W And/or Wo Contrast  Result Date: 03/11/2019 CLINICAL DATA:  Peritonsillar abscess, neck, right chest, and right breast swelling, neck pain  EXAM: CT ANGIOGRAPHY CHEST WITH CONTRAST TECHNIQUE: Multidetector CT imaging of the chest was performed using the standard protocol during bolus administration of intravenous contrast. Multiplanar CT image reconstructions and MIPs were obtained to evaluate the vascular anatomy. CONTRAST:  155mL OMNIPAQUE IOHEXOL 350 MG/ML SOLN COMPARISON:  CT neck, 03/05/2019 FINDINGS: Cardiovascular: Examination for pulmonary embolism is very limited by body habitus and poor contrast bolus, main pulmonary artery HU = 151. Within this limitation, there is no obvious central pulmonary embolism. Evaluation of the lobar and more distal pulmonary arteries is nondiagnostic. There is bulky lymphadenopathy which appears to obstruct the right brachiocephalic vein and superior vena cava. Normal heart size. No pericardial effusion. Mediastinum/Nodes: Redemonstrated, very bulky lower right cervical and right superior mediastinal lymphadenopathy, largest nodes measuring at least 5.8 cm (series 6, image 27). Thyroid gland, trachea, and esophagus demonstrate no significant findings. Lungs/Pleura: Probable right basilar atelectasis. No pleural effusion or pneumothorax. Upper Abdomen: No acute abnormality. Bilateral adrenal nodules measuring approximately 2.1 cm on the left and 1.3 cm on the right. Musculoskeletal: No chest wall abnormality. No acute or significant osseous findings. Review of the MIP images confirms the above findings. IMPRESSION: 1. Examination for pulmonary embolism is very limited by body habitus and poor contrast bolus, main pulmonary artery HU = 151. Within this limitation, there is no obvious central pulmonary embolism. Evaluation of the lobar and more distal pulmonary arteries  is nondiagnostic. 2. Redemonstrated, very bulky lower right cervical and right superior mediastinal lymphadenopathy, largest nodes measuring at least 5.8 cm (series 6, image 27). This appears to obstruct the right brachiocephalic vein and superior vena cava. Findings are highly suspicious for malignancy, including lymphoma and metastatic disease. 3. Bilateral nonspecific soft tissue adrenal nodules, concerning for metastatic disease although incompletely characterized. 4. Probable right basilar atelectasis without definite acute airspace disease. Electronically Signed   By: Eddie Candle M.D.   On: 03/11/2019 18:34   Ct Abdomen Pelvis W Contrast  Result Date: 03/12/2019 CLINICAL DATA:  38 year old with bulky lymphadenopathy in the RIGHT LOWER neck and in the RIGHT superior mediastinum. Evaluate for abdominopelvic lymphadenopathy. EXAM: CT ABDOMEN AND PELVIS WITH CONTRAST TECHNIQUE: Multidetector CT imaging of the abdomen and pelvis was performed using the standard protocol following bolus administration of intravenous contrast. CONTRAST:  133mL OMNIPAQUE IOHEXOL 300 MG/ML IV. Oral contrast was also administered. COMPARISON:  No prior abdominopelvic CT. CTA chest 03/11/2019 is correlated. FINDINGS: Lower chest: Dense consolidation dependently in the POSTERIOR RIGHT LOWER LOBE. Visualized lung bases otherwise clear. Normal heart size. Hepatobiliary: Borderline hepatomegaly. No focal hepatic parenchymal abnormality. Normal anatomic variant in that the LEFT lobe of the liver crosses the midline into the LEFT UPPER QUADRANT. Gallbladder normal in appearance without calcified gallstones. No biliary ductal dilation. Pancreas: Normal in appearance without evidence of mass, ductal dilation, or inflammation. Spleen: Normal in size and appearance. Adrenals/Urinary Tract: BILATERAL adrenal masses, that on the LEFT measuring approximately 1.6 x 2.5 x 2.2 cm and that on the RIGHT measuring approximately 1.3 x 2.1 x 2.7 cm.  Kidneys normal in size and appearance without focal parenchymal abnormality. No hydronephrosis. No evidence of urinary tract calculi. Normal appearing urinary bladder. Stomach/Bowel: Stomach normal in appearance for the degree of distention. Normal-appearing small bowel. Moderate stool burden throughout the normal appearing colon. Normal appendix in the RIGHT UPPER pelvis filled with oral contrast. Vascular/Lymphatic: Minimal atherosclerosis involving the RIGHT common iliac artery. No evidence of abdominal aortic atherosclerosis or aneurysm. Normal-appearing portal venous and systemic venous systems. Enlarged hemiazygous vein  in the retroperitoneum, likely a collateral pathway due to the SVC compression noted on the prior CTA chest. No pathologic lymphadenopathy within the abdomen or pelvis. Reproductive: Normal-appearing uterus and ovaries without evidence of adnexal mass. Other: Edema involving the LATERAL subcutaneous tissues of the LOWER chest and upper abdomen. Musculoskeletal: Degenerative disc disease and spondylosis involving the visualized thoracic spine. Chronic L5-S1 disc protrusion with calcification in the POSTERIOR annular fibers. No acute findings. IMPRESSION: 1. No acute abnormalities involving the abdomen or pelvis. 2. Borderline hepatomegaly without focal hepatic parenchymal abnormality. 3. Bilateral adrenal masses, measured above. Given the fact that the patient may have a malignancy, a dedicated CT of the abdomen without and with contrast (adrenal protocol) is recommended in further evaluation. This recommendation follows ACR consensus guidelines: Managing Incidental Findings on Abdominal CT: White Paper of the ACR Incidental Findings Committee. J Am Coll Radiol 2010;7:754-773. Electronically Signed   By: Evangeline Dakin M.D.   On: 03/12/2019 21:49   Ct Abdomen W Wo Contrast  Result Date: 03/16/2019 CLINICAL DATA:  Bilateral adrenal nodules. EXAM: CT ABDOMEN WITHOUT AND WITH CONTRAST  TECHNIQUE: Multidetector CT imaging of the abdomen was performed following the standard protocol before and following the bolus administration of intravenous contrast. CONTRAST:  122mL OMNIPAQUE IOHEXOL 300 MG/ML  SOLN COMPARISON:  03/12/2019 FINDINGS: Lower chest: Right lower lobe collapse/consolidation. Hepatobiliary: Wedge-shaped area of altered perfusion noted in the lateral segment left liver along the falciform ligament. No suspicious focal abnormality within the liver parenchyma. Gallbladder decompressed. No intrahepatic or extrahepatic biliary dilation. Pancreas: No focal mass lesion. No dilatation of the main duct. No intraparenchymal cyst. No peripancreatic edema. Spleen: No splenomegaly. No focal mass lesion. Adrenals/Urinary Tract: 2.1 cm right adrenal nodule again noted. Absolute attenuation of this nodule is too high to allow classification as adenoma. Lesion shows no substantial enhancement after IV contrast administration and cannot be definitively characterized based on absolute washout or relative washout characteristics. 2.5 cm left adrenal nodule also has attenuation too high on precontrast imaging to allow characterization as an adenoma. Absolute washout and relative washout characteristics of this nodule or indeterminate. Kidneys unremarkable. Stomach/Bowel: Stomach is unremarkable. No gastric wall thickening. No evidence of outlet obstruction. Duodenum is normally positioned as is the ligament of Treitz. Small bowel loops and colonic segments of the abdomen are nondilated. Vascular/Lymphatic: No abdominal aortic aneurysm. There is no gastrohepatic or hepatoduodenal ligament lymphadenopathy. No intraperitoneal or retroperitoneal lymphadenopathy. Other: No intraperitoneal free fluid. Musculoskeletal: No worrisome lytic or sclerotic osseous abnormality. IMPRESSION: 1. Bilateral adrenal nodules as noted on recent CT scan. These nodules cannot be characterized by either absolute attenuation or  washout characteristics. If these represent lipid poor adenomas, MRI of the abdomen may be able to further characterize. Given the history of thoracic lymphadenopathy, close follow-up recommended. Electronically Signed   By: Misty Stanley M.D.   On: 03/16/2019 16:47   [place] Core Biopsy (lymph Nodes)  Result Date: 03/14/2019 INDICATION: 38 year old with cervical and mediastinal lymphadenopathy. Recent tooth extractions for dental abscess. Patient also has a retropharyngeal effusion. Plan for ultrasound-guided biopsy of the right cervical lymphadenopathy. EXAM: ULTRASOUND-GUIDED RIGHT SUPRACLAVICULAR LYMPH NODE BIOPSY MEDICATIONS: None. ANESTHESIA/SEDATION: Moderate (conscious) sedation was employed during this procedure. A total of Versed 2.0 mg and Fentanyl 100 mcg was administered intravenously. Moderate Sedation Time: 15 minutes the patient's level of consciousness and vital signs were monitored continuously by radiology nursing throughout the procedure under my direct supervision. FLUOROSCOPY TIME:  None COMPLICATIONS: None immediate. PROCEDURE: Informed written consent was  obtained from the patient after a thorough discussion of the procedural risks, benefits and alternatives. A timeout was performed prior to the initiation of the procedure. Right side of the neck was prepped with chlorhexidine and sterile field was created. Skin was anesthetized with 1% lidocaine. Using ultrasound guidance, 18 gauge core needle was directed into the large nodal mass in the right supraclavicular area. A total of 6 core biopsies were obtained with an 18 gauge core device. Specimens placed in saline. Bandage placed over the puncture site. FINDINGS: Large lymph nodes on both sides of the neck. Large irregular nodal mass in the right supraclavicular area was targeted for biopsy. Needle position was confirmed within this nodal mass. No evidence for bleeding or hematoma formation at the end of the procedure. Visualized right internal  jugular vein is small and does not completely compress. Evidence for nonocclusive thrombus in the right internal jugular vein. Findings are similar to the recent CT. IMPRESSION: Ultrasound-guided core biopsies of the right supraclavicular nodal mass. Abnormal appearance of the right internal jugular vein. Findings are compatible with nonocclusive thrombus of unknown age. Findings are similar to the recent CT and could be secondary from the inflammatory changes in the neck and compression from the large supraclavicular nodal mass. Electronically Signed   By: Markus Daft M.D.   On: 03/14/2019 17:13    ASSESSMENT: This is a 38 year old female from [place], [place] with   1.  Newly diagnosed non-small cell cancer, squamous cell.  P 16 is pending. 2.  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  PLAN: Tara Aguilar's edema is improving.  She still has some shortness of breath but no major changes in her respiratory status.  She will continue radiation under the care of Dr. Lisbeth Renshaw.  Still awaiting P 16 results.  Working diagnosis is head neck cancer.  From medical oncology standpoint, she may discharge home when medically stable.  Please check with radiation oncology to be sure that she is stable from their standpoint for discharge.  The patient will follow-up at the [facility] on June 3 at 1:00 PM.  She will continue her radiation here in [place] which is due to complete on 03/31/2019.   LOS: 11 days   Mikey Bussing, DNP, AGPCNP-BC, AOCNP 03/22/19    ADDENDUM: I discussed the diagnostic situation with Tara Aguilar. I am leaning in favor of a head-and-neck primary and if the P16 is positive that would weigh further on that side. However I cannot be 100% sure whether or not this is a primary lung. In any case, there is a great deal of overlap in the treatment of those two tumors. By the time she meets with Dr Raliegh Ip at [facility] he will have all the information and will be able to develop a definitive treatment  plan.  Anticipate d/c to home soon. I am not scheduling her at the [facility] clinic but she knows we will  Be glad to see her anytime at her or her [facility] oncologist's discretion.  I personally saw this patient and performed a substantive portion of this encounter with the listed APP documented above.   Chauncey Cruel, MD Medical Oncology and Hematology [facility] [address] [place], [place] [address] Tel. [phone]    Fax. [phone]

## 2019-03-23 ENCOUNTER — Other Ambulatory Visit: Payer: Self-pay | Admitting: Radiation Oncology

## 2019-03-23 ENCOUNTER — Other Ambulatory Visit: Payer: Self-pay

## 2019-03-23 ENCOUNTER — Ambulatory Visit
Admission: RE | Admit: 2019-03-23 | Discharge: 2019-03-23 | Disposition: A | Discharge status: Home / Self Care | Payer: BC Managed Care – PPO | Source: Ambulatory Visit | Attending: Radiation Oncology | Admitting: Radiation Oncology

## 2019-03-23 ENCOUNTER — Encounter (HOSPITAL_COMMUNITY): Payer: Self-pay | Admitting: *Deleted

## 2019-03-23 ENCOUNTER — Telehealth: Payer: Self-pay | Admitting: Radiation Oncology

## 2019-03-23 ENCOUNTER — Encounter: Payer: Self-pay | Admitting: Radiation Oncology

## 2019-03-23 DIAGNOSIS — C779 Secondary and unspecified malignant neoplasm of lymph node, unspecified: Secondary | ICD-10-CM

## 2019-03-23 DIAGNOSIS — Z510 Encounter for antineoplastic radiation therapy: Secondary | ICD-10-CM | POA: Diagnosis not present

## 2019-03-23 DIAGNOSIS — F17210 Nicotine dependence, cigarettes, uncomplicated: Secondary | ICD-10-CM | POA: Diagnosis not present

## 2019-03-23 DIAGNOSIS — Z79899 Other long term (current) drug therapy: Secondary | ICD-10-CM | POA: Diagnosis not present

## 2019-03-23 DIAGNOSIS — J9859 Other diseases of mediastinum, not elsewhere classified: Secondary | ICD-10-CM | POA: Diagnosis not present

## 2019-03-23 DIAGNOSIS — C3490 Malignant neoplasm of unspecified part of unspecified bronchus or lung: Secondary | ICD-10-CM

## 2019-03-23 DIAGNOSIS — J390 Retropharyngeal and parapharyngeal abscess: Secondary | ICD-10-CM | POA: Diagnosis not present

## 2019-03-23 DIAGNOSIS — I871 Compression of vein: Secondary | ICD-10-CM | POA: Diagnosis not present

## 2019-03-23 MED ORDER — OXYCODONE HCL 5 MG PO TABS: ORAL_TABLET | ORAL | 0 refills | Status: DC

## 2019-03-23 NOTE — Telephone Encounter (Addendum)
I attempted to contact pt but her VM is full. I will ask the treatment staff today when she comes for XRT to let her know to expect the call.

## 2019-03-23 NOTE — Progress Notes (Signed)
The patient was seen via WebEx following her radiation to the chest.  She has been having pain with trying to lay flat with her arm upright for treatment as well as pain at nighttime.  She is tried Advil without success.  She reports that the swelling in her arm and hand on the right side have slightly improved, she continues to see collateral vessels in her chest wall which are stable and not progressed.  Her breathing is stable but she does get short of breath with exertion.  She is back at home, and her son is now with her as well.  She is planning on her sister coming into town to help her with transportation and caring for her son this week as well.  We discussed the rationale for oxycodone to be taken 30 minutes prior to treatment and at nighttime to help with sleep.  I suspect that she will have improvement of her symptoms in the coming weeks which will not require long-term narcotics.  She is aware of this.  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  We also discussed that she has been taking Lasix since her discharge yesterday, she was sent home with 40 mg to take daily.  She reports her normal blood pressures are in the 120s over 70s to 80s, her blood pressure in the hospital has been in the 130s over 56E up to 332 systolically.  Today in clinic her blood pressure on the right is 105/68 and on the left 112/79.  She is trying to continue to stay hydrated but feeling quite tired.  Encouraged her to try taking half of the dose of her Lasix and to begin tomorrow taking 20 mg.  This will still be daily.  We will continue to follow her blood pressures and make recommendations as well later this week once we have comparisons.  She states agreement and understanding.  I also let her know that I was ordering a PET scan, and that I would follow-up with this when results are available as well as share this with Dr. Delton Coombes.  She is in agreement.

## 2019-03-23 NOTE — Progress Notes (Signed)
Oncology navigator note:  I attempted to call the patient to introduce myself and explain my role in her care here at [facility].  I was unable to reach her and there is no voicemail to leave a message.  I will attempt again at a later time.

## 2019-03-24 ENCOUNTER — Inpatient Hospital Stay (HOSPITAL_COMMUNITY): Payer: BC Managed Care – PPO | Admitting: Hematology

## 2019-03-24 ENCOUNTER — Encounter (HOSPITAL_COMMUNITY): Payer: Self-pay | Admitting: Hematology

## 2019-03-24 ENCOUNTER — Other Ambulatory Visit: Payer: Self-pay

## 2019-03-24 ENCOUNTER — Other Ambulatory Visit: Payer: Self-pay | Admitting: Radiation Oncology

## 2019-03-24 ENCOUNTER — Ambulatory Visit
Admission: RE | Admit: 2019-03-24 | Discharge: 2019-03-24 | Disposition: A | Discharge status: Home / Self Care | Payer: BC Managed Care – PPO | Source: Ambulatory Visit | Attending: Radiation Oncology | Admitting: Radiation Oncology

## 2019-03-24 DIAGNOSIS — Z8249 Family history of ischemic heart disease and other diseases of the circulatory system: Secondary | ICD-10-CM | POA: Insufficient documentation

## 2019-03-24 DIAGNOSIS — Z832 Family history of diseases of the blood and blood-forming organs and certain disorders involving the immune mechanism: Secondary | ICD-10-CM | POA: Insufficient documentation

## 2019-03-24 DIAGNOSIS — R0789 Other chest pain: Secondary | ICD-10-CM | POA: Diagnosis not present

## 2019-03-24 DIAGNOSIS — R0602 Shortness of breath: Secondary | ICD-10-CM | POA: Insufficient documentation

## 2019-03-24 DIAGNOSIS — C779 Secondary and unspecified malignant neoplasm of lymph node, unspecified: Secondary | ICD-10-CM

## 2019-03-24 DIAGNOSIS — R5383 Other fatigue: Secondary | ICD-10-CM

## 2019-03-24 DIAGNOSIS — Z801 Family history of malignant neoplasm of trachea, bronchus and lung: Secondary | ICD-10-CM | POA: Insufficient documentation

## 2019-03-24 DIAGNOSIS — Z510 Encounter for antineoplastic radiation therapy: Secondary | ICD-10-CM | POA: Diagnosis not present

## 2019-03-24 DIAGNOSIS — R590 Localized enlarged lymph nodes: Secondary | ICD-10-CM | POA: Diagnosis not present

## 2019-03-24 DIAGNOSIS — Z806 Family history of leukemia: Secondary | ICD-10-CM | POA: Insufficient documentation

## 2019-03-24 DIAGNOSIS — F17210 Nicotine dependence, cigarettes, uncomplicated: Secondary | ICD-10-CM

## 2019-03-24 DIAGNOSIS — R599 Enlarged lymph nodes, unspecified: Secondary | ICD-10-CM | POA: Insufficient documentation

## 2019-03-24 DIAGNOSIS — C969 Malignant neoplasm of lymphoid, hematopoietic and related tissue, unspecified: Secondary | ICD-10-CM | POA: Diagnosis not present

## 2019-03-24 DIAGNOSIS — R05 Cough: Secondary | ICD-10-CM | POA: Insufficient documentation

## 2019-03-24 NOTE — Patient Instructions (Signed)
[place] at [facility] Discharge Instructions  You were seen today by Dr. Delton Coombes. He went over your history, family history and how you've been feeling lately. He would like you to have a PET scan and MRI of your brain. He will see you back after your scans for follow up.   Thank you for choosing [place] at [facility] to provide your oncology and hematology care.  To afford each patient quality time with our provider, please arrive at least 15 minutes before your scheduled appointment time.   If you have a lab appointment with the [facility] please come in thru the  Main Entrance and check in at the main information desk  You need to re-schedule your appointment should you arrive 10 or more minutes late.  We strive to give you quality time with our providers, and arriving late affects you and other patients whose appointments are after yours.  Also, if you no show three or more times for appointments you may be dismissed from the clinic at the providers discretion.     Again, thank you for choosing [facility].  Our hope is that these requests will decrease the amount of time that you wait before being seen by our physicians.       _____________________________________________________________  Should you have questions after your visit to [facility], please contact our office at [phone] between the hours of 8:00 a.m. and 4:30 p.m.  Voicemails left after 4:00 p.m. will not be returned until the following business day.  For prescription refill requests, have your pharmacy contact our office and allow 72 hours.    Cancer Center Support Programs:   > Cancer Support Group  2nd Tuesday of the month 1pm-2pm, Journey Room

## 2019-03-24 NOTE — Progress Notes (Signed)
[facility] Initial Visit:  Patient Care Team: Neale Burly, MD as PCP - General (Internal Medicine)  CHIEF COMPLAINTS/PURPOSE OF CONSULTATION: Oncology consult for newly diagnosed Non-Small Cell Carcinoma   HISTORY OF PRESENTING ILLNESS: Tara Aguilar 38 y.o. female presents today for consult regarding newly diagnosed Non-small Cell Carcinoma. She has a past medical history significant for depression, anxiety, obesity, and tobacco abuse. She developed  Facial swelling approximately 2 weeks ago and was treated by her PCP for a tooth abscess. She then presented to [facility] ER after symptoms worsened and a CT on 03/04/2019 revealed an odontogenic abscess with overlying cellulitis. She was given Augmentin and sent home.  She returned to the ED on 03/05/2019 with reports of progressing facial swelling. She was admitted and repeat CT of the neck revealed a 2.4 cm retropharyngeal fluid collection at C3 level and mild mass effect on the posterior oropharynx. No peritonsilar abscess was noted. She had cervical adenopathy including a level IIa node measuring 1.6 cm on the right and 1.4 cm on the left. There was a right sided 6.1 x 4.8 cm nodal conglomerate at level IV. There was also partially visualized adenopathy measuring 6.4 x 4.1 cm in the right paratracheal region.  She underwent interval extraction of multiple right maxillary teeth   She returned to the ED on 03/11/2019 with reports of shortness of breath.CT angiography of the chest and of neck was negative for PE. However, bulky lymphadenopathy obstructing the right brachiocephalic vein and superior vena cava, redemonstrating very bulky lower right cervical and right superior mediastinal adenopathy with the largest nodes measuring at least 5.8 cm was seen. No lesion was specifically seen in the lung parenchyma. She had bilateral adrenal nodules measuring 2.1 cm on the left and 1.3 cm on the right.   A CT of the abdomen and  pelvis with contrast on 03/12/2019 revealed borderline hepatomegaly without focal parenchymal abnormality, normal anatomic variant but the left lobe of the liver crosses the midline, bilateral adrenal masses the left measuring 16 x 25 x 22 mm and the right 13 x 21 x 27 mm. Atherosclerotic changes involving the right common iliac artery were noted, enlarged hemiazygous vein in the retroperitoneum was noted likely a collateral plaque pathway due to her SVC compression.   On 03/14/2019 she underwent biopsy of her right cervical adenopathy. Pathology suggest non-small cell cancer, squamous cell. Cells are positive for cytokeratin 7, cytokeratin 5/6, MOC-31 and p63 but negative for cytokeratin 20, mucicarmine, S100, Pax-8, and GATA-3. P16 negative. She was referred to radiation oncology to receive radiation to mediastinal lymphadenopathy.  She reports significant fatigue. She reports dyspnea on exertion. Shortness of breath is improving with radiation therapy. She continues with facial edema, this is somewhat improved. Reports dysphagia with solid foods. She was a current 1ppd smoker for 25 years. She has now stopped smoking. Reports chronic cough that is productive at times. Denies hemoptysis.  She recently lost her husband in December of 2019 secondary to [place]. She has a family history significant for MDS in her father and lung cancer in her mother. She was working as a Occupational psychologist until recently.   Review of Systems  Constitutional: Positive for fatigue.  HENT:   Positive for trouble swallowing.   Eyes: Negative.   Respiratory: Positive for chest tightness, cough and shortness of breath.   Cardiovascular: Positive for chest pain.  Gastrointestinal: Negative.   Endocrine: Negative.   Genitourinary: Negative.    Musculoskeletal: Negative.  Skin: Negative.   Neurological: Negative.   Hematological: Positive for adenopathy.  Psychiatric/Behavioral: Negative.     MEDICAL HISTORY: Past  Medical History:  Diagnosis Date  . Depression   . Glucose intolerance 03/11/2019  . Obesity, Class III, BMI 40-49.9 (morbid obesity) ([place]) 03/11/2019    SURGICAL HISTORY: Past Surgical History:  Procedure Laterality Date  . CESAREAN SECTION    . IRRIGATION AND DEBRIDEMENT ABSCESS  03/15/2019   RETROPHARYNGEAL   . TONSILLECTOMY AND ADENOIDECTOMY N/A 03/15/2019   Procedure: IRRIGATION AND DEBRIDEMENT OF RETROPHARYNGEAL ABSCESS;  Surgeon: Leta Baptist, MD;  Location: [place];  Service: ENT;  Laterality: N/A;    SOCIAL HISTORY: Social History   Socioeconomic History  . Marital status: Widowed    Spouse name: Not on file  . Number of children: 1  . Years of education: Not on file  . Highest education level: Not on file  Occupational History  . Not on file  Social Needs  . Financial resource strain: Not very hard  . Food insecurity:    Worry: Never true    Inability: Never true  . Transportation needs:    Medical: No    Non-medical: No  Tobacco Use  . Smoking status: Current Every Day Smoker    Packs/day: 1.00    Years: 25.00    Pack years: 25.00    Types: Cigarettes  . Smokeless tobacco: Never Used  Substance and Sexual Activity  . Alcohol use: Not Currently    Comment: social   . Drug use: Not Currently  . Sexual activity: Not on file  Lifestyle  . Physical activity:    Days per week: 0 days    Minutes per session: 0 min  . Stress: Only a little  Relationships  . Social connections:    Talks on phone: More than three times a week    Gets together: Twice a week    Attends religious service: Never    Active member of club or organization: No    Attends meetings of clubs or organizations: Never    Relationship status: Widowed  . Intimate partner violence:    Fear of current or ex partner: No    Emotionally abused: No    Physically abused: No    Forced sexual activity: No  Other Topics Concern  . Not on file  Social History Narrative   03/07/2019   Husband passed  away four months ago from Cancer of the [place].   Pt. Has 36 yo son at home.    FAMILY HISTORY Family History  Problem Relation Age of Onset  . Lung cancer Mother   . Hypertension Mother   . Leukemia Father   . Myelodysplastic syndrome Father     ALLERGIES:  has No Known Allergies.  MEDICATIONS:  Current Outpatient Medications  Medication Sig Dispense Refill  . buPROPion (WELLBUTRIN XL) 150 MG 24 hr tablet Take 150 mg by mouth daily.    . furosemide (LASIX) 40 MG tablet Take 1 tablet (40 mg total) by mouth daily for 14 days. (Patient taking differently: Take 20 mg by mouth daily. ) 14 tablet 0  . ibuprofen (ADVIL) 400 MG tablet Take 800 mg by mouth every 6 (six) hours as needed.    . lidocaine (LIDODERM) 5 % Place 1 patch onto the skin daily. Remove & Discard patch within 12 hours or as directed by MD 30 patch 0  . oxyCODONE (OXY IR/ROXICODONE) 5 MG immediate release tablet Take one tab po 30  minutes prior to XRT and po qHS 60 tablet 0   No current facility-administered medications for this visit.     PHYSICAL EXAMINATION:  ECOG PERFORMANCE STATUS: 1 - Symptomatic but completely ambulatory   Vitals:   03/24/19 1316  BP: 116/69  Pulse: (!) 111  Resp: 16  Temp: 97.7 F (36.5 C)  SpO2: 96%    Filed Weights   03/24/19 1316  Weight: 288 lb 1.6 oz (130.7 kg)     Physical Exam Vitals signs reviewed.  Constitutional:      Appearance: Normal appearance. She is obese.  HENT:     Head: Normocephalic.     Nose: Nose normal.     Mouth/Throat:     Mouth: Mucous membranes are moist.     Pharynx: Oropharynx is clear.  Eyes:     Extraocular Movements: Extraocular movements intact.     Conjunctiva/sclera: Conjunctivae normal.  Neck:     Musculoskeletal: Muscular tenderness present.  Cardiovascular:     Rate and Rhythm: Normal rate and regular rhythm.     Heart sounds: Normal heart sounds.  Pulmonary:     Breath sounds: Normal breath sounds.  Chest:     Chest wall:  Tenderness present.  Abdominal:     General: Bowel sounds are normal.     Palpations: Abdomen is soft.  Musculoskeletal: Normal range of motion.  Lymphadenopathy:     Cervical: Cervical adenopathy present.  Skin:    General: Skin is warm.  Neurological:     General: No focal deficit present.     Mental Status: She is alert and oriented to person, place, and time.  Psychiatric:        Mood and Affect: Mood normal.        Thought Content: Thought content normal.        Judgment: Judgment normal.      LABORATORY DATA: I have personally reviewed the data as listed:  No results displayed because visit has over 200 results.    Admission on 03/05/2019, Discharged on 03/08/2019  Component Date Value Ref Range Status  . Sodium 03/05/2019 137  135 - 145 mmol/L Final  . Potassium 03/05/2019 4.0  3.5 - 5.1 mmol/L Final  . Chloride 03/05/2019 103  98 - 111 mmol/L Final  . CO2 03/05/2019 24  22 - 32 mmol/L Final  . Glucose, Bld 03/05/2019 125* 70 - 99 mg/dL Final  . BUN 03/05/2019 19  6 - 20 mg/dL Final  . Creatinine, Ser 03/05/2019 0.81  0.44 - 1.00 mg/dL Final  . Calcium 03/05/2019 9.2  8.9 - 10.3 mg/dL Final  . GFR calc non Af Amer 03/05/2019 >60  >60 mL/min Final  . GFR calc Af Amer 03/05/2019 >60  >60 mL/min Final  . Anion gap 03/05/2019 10  5 - 15 Final   Performed at [facility], [address] [place], [place] [address]  . Lactic Acid, Venous 03/05/2019 1.1  0.5 - 1.9 mmol/L Final   Performed at [facility], [address] [place], [place] [address]  . Lactic Acid, Venous 03/05/2019 1.1  0.5 - 1.9 mmol/L Final   Performed at [facility], [address], [place], [place] [address]  . WBC 03/05/2019 12.8* 4.0 - 10.5 K/uL Final  . RBC 03/05/2019 4.68  3.87 - 5.11 MIL/uL Final  . Hemoglobin 03/05/2019 13.1  12.0 - 15.0 g/dL Final  . HCT 03/05/2019 40.9  36.0 - 46.0 % Final  . MCV 03/05/2019 87.4  80.0 - 100.0  fL Final  . MCH 03/05/2019 28.0  26.0 - 34.0 pg Final  . MCHC  03/05/2019 32.0  30.0 - 36.0 g/dL Final  . RDW 03/05/2019 14.8  11.5 - 15.5 % Final  . Platelets 03/05/2019 322  150 - 400 K/uL Final  . nRBC 03/05/2019 0.0  0.0 - 0.2 % Final  . Neutrophils Relative % 03/05/2019 75  % Final  . Neutro Abs 03/05/2019 9.6* 1.7 - 7.7 K/uL Final  . Lymphocytes Relative 03/05/2019 16  % Final  . Lymphs Abs 03/05/2019 2.1  0.7 - 4.0 K/uL Final  . Monocytes Relative 03/05/2019 6  % Final  . Monocytes Absolute 03/05/2019 0.8  0.1 - 1.0 K/uL Final  . Eosinophils Relative 03/05/2019 1  % Final  . Eosinophils Absolute 03/05/2019 0.1  0.0 - 0.5 K/uL Final  . Basophils Relative 03/05/2019 1  % Final  . Basophils Absolute 03/05/2019 0.1  0.0 - 0.1 K/uL Final  . Immature Granulocytes 03/05/2019 1  % Final  . Abs Immature Granulocytes 03/05/2019 0.15* 0.00 - 0.07 K/uL Final   Performed at [facility], [address], [place], [place] [address]  . SARS Coronavirus 2 03/05/2019 NEGATIVE  NEGATIVE Final   Comment: (NOTE) If result is NEGATIVE SARS-CoV-2 target nucleic acids are NOT DETECTED. The SARS-CoV-2 RNA is generally detectable in upper and lower  respiratory specimens during the acute phase of infection. The lowest  concentration of SARS-CoV-2 viral copies this assay can detect is 250  copies / mL. A negative result does not preclude SARS-CoV-2 infection  and should not be used as the sole basis for treatment or other  patient management decisions.  A negative result may occur with  improper specimen collection / handling, submission of specimen other  than nasopharyngeal swab, presence of viral mutation(s) within the  areas targeted by this assay, and inadequate number of viral copies  (<250 copies / mL). A negative result must be combined with clinical  observations, patient history, and epidemiological information. If result is POSITIVE SARS-CoV-2 target nucleic acids are DETECTED. The SARS-CoV-2 RNA is generally detectable in upper and lower  respiratory  specimens dur                          ing the acute phase of infection.  Positive  results are indicative of active infection with SARS-CoV-2.  Clinical  correlation with patient history and other diagnostic information is  necessary to determine patient infection status.  Positive results do  not rule out bacterial infection or co-infection with other viruses. If result is PRESUMPTIVE POSTIVE SARS-CoV-2 nucleic acids MAY BE PRESENT.   A presumptive positive result was obtained on the submitted specimen  and confirmed on repeat testing.  While 2019 novel coronavirus  (SARS-CoV-2) nucleic acids may be present in the submitted sample  additional confirmatory testing may be necessary for epidemiological  and / or clinical management purposes  to differentiate between  SARS-CoV-2 and other Sarbecovirus currently known to infect humans.  If clinically indicated additional testing with an alternate test  methodology [phone]) is advised. The SARS-CoV-2 RNA is generally  detectable in upper and lower respiratory sp                          ecimens during the acute  phase of infection. The expected result is Negative. Fact Sheet for Patients:  StrictlyIdeas.no Fact Sheet for Healthcare Providers: BankingDealers.co.za This test  is not yet approved or cleared by the [place] and has been authorized for detection and/or diagnosis of SARS-CoV-2 by FDA under an Emergency Use Authorization (EUA).  This EUA will remain in effect (meaning this test can be used) for the duration of the COVID-19 declaration under Section 564(b)(1) of the Act, 21 U.S.C. section 360bbb-3(b)(1), unless the authorization is terminated or revoked sooner. Performed at [facility], [address] [place], [place] [address]   . MRSA by PCR 03/05/2019 NEGATIVE  NEGATIVE Final   Comment:        The GeneXpert MRSA Assay (FDA approved for NASAL specimens only), is one  component of a comprehensive MRSA colonization surveillance program. It is not intended to diagnose MRSA infection nor to guide or monitor treatment for MRSA infections. Performed at [facility], [address], [place], [place] [address]   . Specimen Description 03/05/2019 RIGHT ANTECUBITAL   Final  . Special Requests 03/05/2019 BOTTLES DRAWN AEROBIC AND ANAEROBIC Blood Culture adequate volume   Final  . Culture 03/05/2019    Final                   Value:NO GROWTH 5 DAYS Performed at [facility], [address] [place], [place] [address]   . Report Status 03/05/2019 03/10/2019 FINAL   Final  . Specimen Description 03/05/2019 LEFT ANTECUBITAL   Final  . Special Requests 03/05/2019 BOTTLES DRAWN AEROBIC AND ANAEROBIC Blood Culture adequate volume   Final  . Culture 03/05/2019    Final                   Value:NO GROWTH 5 DAYS Performed at [facility], [address], [place], [place] [address]   . Report Status 03/05/2019 03/10/2019 FINAL   Final  . HIV Screen 4th Generation wRfx 03/06/2019 Non Reactive  Non Reactive Final   Comment: (NOTE) Performed At: [facility] [address] [place], [place] [id number] Rush Farmer MD [id number]   . WBC 03/06/2019 15.4* 4.0 - 10.5 K/uL Final  . RBC 03/06/2019 4.70  3.87 - 5.11 MIL/uL Final  . Hemoglobin 03/06/2019 13.0  12.0 - 15.0 g/dL Final  . HCT 03/06/2019 40.7  36.0 - 46.0 % Final  . MCV 03/06/2019 86.6  80.0 - 100.0 fL Final  . MCH 03/06/2019 27.7  26.0 - 34.0 pg Final  . MCHC 03/06/2019 31.9  30.0 - 36.0 g/dL Final  . RDW 03/06/2019 14.6  11.5 - 15.5 % Final  . Platelets 03/06/2019 345  150 - 400 K/uL Final  . nRBC 03/06/2019 0.0  0.0 - 0.2 % Final   Performed at [facility], [place] [address] [place], [place] [address]  . Sodium 03/06/2019 135  135 - 145 mmol/L Final  . Potassium 03/06/2019 3.8  3.5 - 5.1 mmol/L Final  . Chloride 03/06/2019 95* 98 - 111 mmol/L Final  . CO2 03/06/2019 28  22 - 32 mmol/L Final   . Glucose, Bld 03/06/2019 102* 70 - 99 mg/dL Final  . BUN 03/06/2019 12  6 - 20 mg/dL Final  . Creatinine, Ser 03/06/2019 0.94  0.44 - 1.00 mg/dL Final  . Calcium 03/06/2019 9.2  8.9 - 10.3 mg/dL Final  . GFR calc non Af Amer 03/06/2019 >60  >60 mL/min Final  . GFR calc Af Amer 03/06/2019 >60  >60 mL/min Final  . Anion gap 03/06/2019 12  5 - 15 Final   Performed at [facility], [place]  [address], [place], [place] [address]  . Glucose-Capillary 03/06/2019 87  70 - 99 mg/dL Final  . Glucose-Capillary 03/06/2019 95  70 - 99 mg/dL Final  . Hgb A1c MFr Bld 03/07/2019 6.2* 4.8 - 5.6 % Final   Comment: (NOTE) Pre diabetes:          5.7%-6.4% Diabetes:              >6.4% Glycemic control for   <7.0% adults with diabetes   . Mean Plasma Glucose 03/07/2019 131.24  mg/dL Final   Performed at [facility] [address] [place], [place] [address]  . WBC 03/07/2019 11.5* 4.0 - 10.5 K/uL Final  . RBC 03/07/2019 4.65  3.87 - 5.11 MIL/uL Final  . Hemoglobin 03/07/2019 13.1  12.0 - 15.0 g/dL Final  . HCT 03/07/2019 40.4  36.0 - 46.0 % Final  . MCV 03/07/2019 86.9  80.0 - 100.0 fL Final  . MCH 03/07/2019 28.2  26.0 - 34.0 pg Final  . MCHC 03/07/2019 32.4  30.0 - 36.0 g/dL Final  . RDW 03/07/2019 14.7  11.5 - 15.5 % Final  . Platelets 03/07/2019 296  150 - 400 K/uL Final  . nRBC 03/07/2019 0.0  0.0 - 0.2 % Final   Performed at [facility], [place] [address] [place], [place] [address]  . Sodium 03/07/2019 137  135 - 145 mmol/L Final  . Potassium 03/07/2019 3.9  3.5 - 5.1 mmol/L Final  . Chloride 03/07/2019 100  98 - 111 mmol/L Final  . CO2 03/07/2019 23  22 - 32 mmol/L Final  . Glucose, Bld 03/07/2019 87  70 - 99 mg/dL Final  . BUN 03/07/2019 9  6 - 20 mg/dL Final  . Creatinine, Ser 03/07/2019 0.85  0.44 - 1.00 mg/dL Final  . Calcium 03/07/2019 9.0  8.9 - 10.3 mg/dL Final  . GFR calc non Af Amer 03/07/2019 >60  >60 mL/min Final  . GFR calc Af Amer 03/07/2019 >60  >60 mL/min  Final  . Anion gap 03/07/2019 14  5 - 15 Final   Performed at [facility], [place] [address], [place], [place] [address]  . Glucose-Capillary 03/07/2019 100* 70 - 99 mg/dL Final  . Glucose-Capillary 03/07/2019 87  70 - 99 mg/dL Final  . Glucose-Capillary 03/07/2019 97  70 - 99 mg/dL Final  . Glucose-Capillary 03/07/2019 84  70 - 99 mg/dL Final  . Glucose-Capillary 03/07/2019 116* 70 - 99 mg/dL Final  Admission on 03/04/2019, Discharged on 03/04/2019  Component Date Value Ref Range Status  . Sodium 03/04/2019 137  135 - 145 mmol/L Final  . Potassium 03/04/2019 4.5  3.5 - 5.1 mmol/L Final  . Chloride 03/04/2019 103  98 - 111 mmol/L Final  . CO2 03/04/2019 24  22 - 32 mmol/L Final  . Glucose, Bld 03/04/2019 113* 70 - 99 mg/dL Final  . BUN 03/04/2019 24* 6 - 20 mg/dL Final  . Creatinine, Ser 03/04/2019 0.80  0.44 - 1.00 mg/dL Final  . Calcium 03/04/2019 9.4  8.9 - 10.3 mg/dL Final  . GFR calc non Af Amer 03/04/2019 >60  >60 mL/min Final  . GFR calc Af Amer 03/04/2019 >60  >60 mL/min Final  . Anion gap 03/04/2019 10  5 - 15 Final   Performed at [facility], [address], [place], [place] [address]  . WBC 03/04/2019 16.6* 4.0 - 10.5 K/uL Final  . RBC 03/04/2019 4.69  3.87 - 5.11 MIL/uL Final  . Hemoglobin  03/04/2019 13.0  12.0 - 15.0 g/dL Final  . HCT 03/04/2019 41.0  36.0 - 46.0 % Final  . MCV 03/04/2019 87.4  80.0 - 100.0 fL Final  . MCH 03/04/2019 27.7  26.0 - 34.0 pg Final  . MCHC 03/04/2019 31.7  30.0 - 36.0 g/dL Final  . RDW 03/04/2019 14.6  11.5 - 15.5 % Final  . Platelets 03/04/2019 349  150 - 400 K/uL Final  . nRBC 03/04/2019 0.0  0.0 - 0.2 % Final  . Neutrophils Relative % 03/04/2019 74  % Final  . Neutro Abs 03/04/2019 12.3* 1.7 - 7.7 K/uL Final  . Lymphocytes Relative 03/04/2019 18  % Final  . Lymphs Abs 03/04/2019 3.0  0.7 - 4.0 K/uL Final  . Monocytes Relative 03/04/2019 5  % Final  . Monocytes Absolute 03/04/2019 0.8  0.1 - 1.0 K/uL Final  . Eosinophils Relative  03/04/2019 0  % Final  . Eosinophils Absolute 03/04/2019 0.1  0.0 - 0.5 K/uL Final  . Basophils Relative 03/04/2019 1  % Final  . Basophils Absolute 03/04/2019 0.1  0.0 - 0.1 K/uL Final  . Immature Granulocytes 03/04/2019 2  % Final  . Abs Immature Granulocytes 03/04/2019 0.32* 0.00 - 0.07 K/uL Final   Performed at [facility], [address], [place], [place] [address]  . Preg Test, Ur 03/04/2019 NEGATIVE  NEGATIVE Final   Comment:        THE SENSITIVITY OF THIS METHODOLOGY IS >20 mIU/mL. Performed at [facility], [address], [place], [place] [address]     RADIOGRAPHIC STUDIES: I have personally reviewed the radiological images as listed and agree with the findings in the report   ASSESSMENT/PLAN Cancer Staging No matching staging information was found for the patient.   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 - 03/06/2019: CT neck: Revealed a 2.4 cm retropharyngeal fluid collection at C3 level and mild mass effect on the posterior oropharynx. No peritonsilar abscess was noted. She had cervical adenopathy including a level IIa node measuring 1.6 cm on the right and 1.4 cm on the left. There was a right sided 6.1 x 4.8 cm nodal conglomerate at level IV. There was also partially visualized adenopathy measuring 6.4 x 4.1 cm in the right paratracheal region.  - She was treated with antibiotics and underwent interval extraction of multiple right maxillary teeth. - 03/11/2019:Presented to ED with SOB; CT angiogram of the chest and of neck was negative for PE. However, bulky lymphadenopathy obstructing the right brachiocephalic vein and superior vena cava, redemonstrating very bulky lower right cervical and right superior mediastinal adenopathy with the largest nodes measuring at least 5.8 cm was seen. No lesion was specifically seen in the lung parenchyma. She had bilateral adrenal nodules  measuring 2.1 cm on the left and 1.3 cm on the right.  - 03/12/2019: CT of the abdomen and pelvis with contrast on 03/12/2019 revealed borderline hepatomegaly without focal parenchymal abnormality, normal anatomic variant but the left lobe of the liver crosses the midline, bilateral adrenal masses the left measuring 16 x 25 x 22 mm and the right 13 x 21 x 27 mm. Atherosclerotic changes involving the right common iliac artery were noted, enlarged hemiazygous vein in the retroperitoneum was noted likely a collateral plaque pathway due to her SVC compression. - 03/14/2019: Biopsy of her right cervical adenopathy. Pathology suggest non-small cell cancer, squamous cell. Cells are positive for cytokeratin  7, cytokeratin 5/6, MOC-31 and p63 but negative for cytokeratin 20, mucicarmine, S100, Pax-8, and GATA-3. P16 negative  - 03/16/2019: Evaluated by radiation oncology and is currently receiving radiation to mediastinum lymphadenopathy.  - Today, we discussed current findings. We will need to complete additional work up with PET/CT and MRI brain. Concern for lung primary with pathology consistent with squamous cells. We will also need to send [facility] testing.  Regardless, I would recommend treatment for squamous cell with Carboplatin and Paclitaxel. We will refer her to general surgery for [facility] placement.  Given her smoking history, this is more consistent of lung primary. -She will be completing radiation therapy on 03/31/2019. - She will be seen back after the PET CT scan to discuss the results.     Orders Placed This Encounter  Procedures  . MR Brain W Wo Contrast    Standing Status:   Future    Standing Expiration Date:   03/23/2020    Order Specific Question:   ** REASON FOR EXAM (FREE TEXT)    Answer:   squamous cell carcinoma of lymph node    Order Specific Question:   If indicated for the ordered procedure, I authorize the administration of contrast media per Radiology protocol    Answer:   Yes     Order Specific Question:   What is the patient's sedation requirement?    Answer:   No Sedation    Order Specific Question:   Does the patient have a pacemaker or implanted devices?    Answer:   No    Order Specific Question:   Use SRS Protocol?    Answer:   Yes    Order Specific Question:   Radiology Contrast Protocol - do NOT remove file path    Answer:   [email or web address]    Order Specific Question:   Preferred imaging location?    Answer:   [facility] (table limit-350lbs)  . NM PET Image Initial (PI) Skull Base To Thigh    Standing Status:   Future    Standing Expiration Date:   03/23/2020    Order Specific Question:   ** REASON FOR EXAM (FREE TEXT)    Answer:   squamous cell carcinoma of mediastinum    Order Specific Question:   If indicated for the ordered procedure, I authorize the administration of a radiopharmaceutical per Radiology protocol    Answer:   Yes    Order Specific Question:   Is the patient pregnant?    Answer:   No    Order Specific Question:   Radiology Contrast Protocol - do NOT remove file path    Answer:   [email or web address]    All questions were answered. The patient knows to call the clinic with any problems, questions or concerns.  This note was electronically signed.    Derek Jack, MD  03/25/2019 5:37 PM

## 2019-03-24 NOTE — Assessment & Plan Note (Addendum)
1. Non-Small Cell Carcinoma, Squamous Cell - Presented with facial edema beginning of May 2020. - 03/06/2019: CT neck: Revealed a 2.4 cm retropharyngeal fluid collection at C3 level and mild mass effect on the posterior oropharynx. No peritonsilar abscess was noted. She had cervical adenopathy including a level IIa node measuring 1.6 cm on the right and 1.4 cm on the left. There was a right sided 6.1 x 4.8 cm nodal conglomerate at level IV. There was also partially visualized adenopathy measuring 6.4 x 4.1 cm in the right paratracheal region.  - She was treated with antibiotics and underwent interval extraction of multiple right maxillary teeth. - 03/11/2019:Presented to ED with SOB; CT angiogram of the chest and of neck was negative for PE. However, bulky lymphadenopathy obstructing the right brachiocephalic vein and superior vena cava, redemonstrating very bulky lower right cervical and right superior mediastinal adenopathy with the largest nodes measuring at least 5.8 cm was seen. No lesion was specifically seen in the lung parenchyma. She had bilateral adrenal nodules measuring 2.1 cm on the left and 1.3 cm on the right.  - 03/12/2019: CT of the abdomen and pelvis with contrast on 03/12/2019 revealed borderline hepatomegaly without focal parenchymal abnormality, normal anatomic variant but the left lobe of the liver crosses the midline, bilateral adrenal masses the left measuring 16 x 25 x 22 mm and the right 13 x 21 x 27 mm. Atherosclerotic changes involving the right common iliac artery were noted, enlarged hemiazygous vein in the retroperitoneum was noted likely a collateral plaque pathway due to her SVC compression. - 03/14/2019: Biopsy of her right cervical adenopathy. Pathology suggest non-small cell cancer, squamous cell. Cells are positive for cytokeratin 7, cytokeratin 5/6, MOC-31 and p63 but negative for cytokeratin 20, mucicarmine, S100, Pax-8, and GATA-3. P16 negative  - 03/16/2019: Evaluated by  radiation oncology and is currently receiving radiation to mediastinum lymphadenopathy.  - Today, we discussed current findings. We will need to complete additional work up with PET/CT and MRI brain. Concern for lung primary with pathology consistent with squamous cells. We will also need to send [facility] One testing.  Regardless, I would recommend treatment for squamous cell with Carboplatin and Paclitaxel. We will refer her to general surgery for [facility] placement.  Given her smoking history, this is more consistent of lung primary. -She will be completing radiation therapy on 03/31/2019. - She will be seen back after the PET CT scan to discuss the results.

## 2019-03-25 ENCOUNTER — Ambulatory Visit
Admission: RE | Admit: 2019-03-25 | Discharge: 2019-03-25 | Disposition: A | Discharge status: Home / Self Care | Payer: BC Managed Care – PPO | Source: Ambulatory Visit | Attending: Radiation Oncology | Admitting: Radiation Oncology

## 2019-03-25 ENCOUNTER — Other Ambulatory Visit: Payer: Self-pay

## 2019-03-25 DIAGNOSIS — Z510 Encounter for antineoplastic radiation therapy: Secondary | ICD-10-CM | POA: Diagnosis not present

## 2019-03-25 NOTE — Progress Notes (Signed)
03/24/19 Oncology Navigator Note:  I talked with patient today following the visit with Dr. Delton Coombes.  I explained that I had been trying to reach her.  I discussed with her how I will be involved in her care.  I talked with her about any needs she may have at this time.  Her biggest concern is that she is alone and needs to make sure that her job is secured because she is raising her 40 year old son by herself.  I assured her that we will complete FMLA papers for her in a timely manner and get them returned to her employer.  I talked with her about transportation and she will be set up with [employer].  She is a [facility] resident and qualifies for their services.  I told her that I would refer her to our social worker that could assist with some support services for both her and her son as they lost her husband 6 months ago.  I offered her a referral to our chaplain and family refuses at this time. Patient was provided my contact information and advised to call me at any time if she has concerns or questions.  She verbalizes appreciation and understanding.

## 2019-03-28 ENCOUNTER — Ambulatory Visit
Admission: RE | Admit: 2019-03-28 | Discharge: 2019-03-28 | Disposition: A | Discharge status: Home / Self Care | Payer: BC Managed Care – PPO | Source: Ambulatory Visit | Attending: Radiation Oncology | Admitting: Radiation Oncology

## 2019-03-28 ENCOUNTER — Other Ambulatory Visit: Payer: Self-pay

## 2019-03-28 ENCOUNTER — Encounter: Payer: Self-pay | Admitting: Radiation Oncology

## 2019-03-28 DIAGNOSIS — C779 Secondary and unspecified malignant neoplasm of lymph node, unspecified: Secondary | ICD-10-CM | POA: Insufficient documentation

## 2019-03-28 DIAGNOSIS — J9859 Other diseases of mediastinum, not elsewhere classified: Secondary | ICD-10-CM | POA: Insufficient documentation

## 2019-03-28 DIAGNOSIS — F17210 Nicotine dependence, cigarettes, uncomplicated: Secondary | ICD-10-CM | POA: Insufficient documentation

## 2019-03-28 DIAGNOSIS — Z79899 Other long term (current) drug therapy: Secondary | ICD-10-CM | POA: Insufficient documentation

## 2019-03-28 DIAGNOSIS — Z510 Encounter for antineoplastic radiation therapy: Secondary | ICD-10-CM | POA: Diagnosis not present

## 2019-03-28 DIAGNOSIS — I871 Compression of vein: Secondary | ICD-10-CM | POA: Diagnosis not present

## 2019-03-28 DIAGNOSIS — J390 Retropharyngeal and parapharyngeal abscess: Secondary | ICD-10-CM | POA: Insufficient documentation

## 2019-03-28 MED ORDER — OXYCODONE HCL 5 MG PO TABS: ORAL_TABLET | ORAL | 0 refills | Status: DC

## 2019-03-28 NOTE — Progress Notes (Signed)
I was called to the linac to see the patient. She has had increasing edema over the weekend and on Saturday started back on Lasix 40 mg per day. She also started a steroid dose pack per Dr. Delton Coombes. She continues to have orthopnea but the staff stated she's turning blue. We checked vitals and her sitting BP was 134/100, pulse 96, pulse ox 94% RA that went up to 95%. When laying flat, her BP was 149/97, pulse was 102, and pulse ox was 96% on RA. I will reach out to Dr. Delton Coombes but fortunately her pulse ox did not drop after laying flat for about 5 minutes. We will continue XRT for her SVC. Unfortunately her RUE and neck are increased in edema. She is otherwise only SOB on exertion as was prior to her XRT beginning. She denies any chest pain, syncope or presyncopal events, and denies any numbness or tingling of her face or mouth during our assessment. I did not witness any cyanotic changes but if this is noted again staff was encouraged to keep [place] notified.\

## 2019-03-29 ENCOUNTER — Other Ambulatory Visit: Payer: Self-pay | Admitting: Radiation Oncology

## 2019-03-29 ENCOUNTER — Ambulatory Visit: Payer: BC Managed Care – PPO | Admitting: General Surgery

## 2019-03-29 ENCOUNTER — Other Ambulatory Visit (HOSPITAL_COMMUNITY): Payer: Self-pay | Admitting: *Deleted

## 2019-03-29 ENCOUNTER — Encounter: Payer: Self-pay | Admitting: General Surgery

## 2019-03-29 ENCOUNTER — Other Ambulatory Visit: Payer: Self-pay

## 2019-03-29 ENCOUNTER — Ambulatory Visit
Admission: RE | Admit: 2019-03-29 | Discharge: 2019-03-29 | Disposition: A | Discharge status: Home / Self Care | Payer: BC Managed Care – PPO | Source: Ambulatory Visit | Attending: Radiation Oncology | Admitting: Radiation Oncology

## 2019-03-29 VITALS — BP 138/85 | HR 107 | Temp 98.2°F | Resp 18 | Ht 66.0 in | Wt 297.0 lb

## 2019-03-29 DIAGNOSIS — I871 Compression of vein: Secondary | ICD-10-CM

## 2019-03-29 DIAGNOSIS — C779 Secondary and unspecified malignant neoplasm of lymph node, unspecified: Secondary | ICD-10-CM

## 2019-03-29 DIAGNOSIS — T884XXD Failed or difficult intubation, subsequent encounter: Secondary | ICD-10-CM

## 2019-03-29 DIAGNOSIS — Z510 Encounter for antineoplastic radiation therapy: Secondary | ICD-10-CM | POA: Diagnosis not present

## 2019-03-29 MED ORDER — OXYCODONE HCL 5 MG PO TABS: ORAL_TABLET | ORAL | 0 refills | Status: DC

## 2019-03-29 NOTE — Progress Notes (Signed)
Received call from Dr. Constance Haw that she is unable to perform port placement safely for patient. Orders for IR to place port per Dr. Delton Coombes.

## 2019-03-29 NOTE — Patient Instructions (Signed)
Referral to Interventional Radiology for [facility] a Catheter Placement.   Implanted [facility] Guide An implanted port is a device that is placed under the skin. It is usually placed in the chest. The device can be used to give IV medicine, to take blood, or for dialysis. You may have an implanted port if:  You need IV medicine that would be irritating to the small veins in your hands or arms.  You need IV medicines, such as antibiotics, for a long period of time.  You need IV nutrition for a long period of time.  You need dialysis. Having a port means that your health care provider will not need to use the veins in your arms for these procedures. You may have fewer limitations when using a port than you would if you used other types of long-term IVs, and you will likely be able to return to normal activities after your incision heals. An implanted port has two main parts:  Reservoir. The reservoir is the part where a needle is inserted to give medicines or draw blood. The reservoir is round. After it is placed, it appears as a small, raised area under your skin.  Catheter. The catheter is a thin, flexible tube that connects the reservoir to a vein. Medicine that is inserted into the reservoir goes into the catheter and then into the vein. How is my port accessed? To access your port:  A numbing cream may be placed on the skin over the port site.  Your health care provider will put on a mask and sterile gloves.  The skin over your port will be cleaned carefully with a germ-killing soap and allowed to dry.  Your health care provider will gently pinch the port and insert a needle into it.  Your health care provider will check for a blood return to make sure the port is in the vein and is not clogged.  If your port needs to remain accessed to get medicine continuously (constant infusion), your health care provider will place a clear bandage (dressing) over the needle site. The dressing and  needle will need to be changed every week, or as told by your health care provider. What is flushing? Flushing helps keep the port from getting clogged. Follow instructions from your health care provider about how and when to flush the port. Ports are usually flushed with saline solution or a medicine called heparin. The need for flushing will depend on how the port is used:  If the port is only used from time to time to give medicines or draw blood, the port may need to be flushed: ? Before and after medicines have been given. ? Before and after blood has been drawn. ? As part of routine maintenance. Flushing may be recommended every 4-6 weeks.  If a constant infusion is running, the port may not need to be flushed.  Throw away any syringes in a disposal container that is meant for sharp items (sharps container). You can buy a sharps container from a pharmacy, or you can make one by using an empty hard plastic bottle with a cover. How long will my port stay implanted? The port can stay in for as long as your health care provider thinks it is needed. When it is time for the port to come out, a surgery will be done to remove it. The surgery will be similar to the procedure that was done to put the port in. Follow these instructions at home:  Flush your port as told by your health care provider.  If you need an infusion over several days, follow instructions from your health care provider about how to take care of your port site. Make sure you: ? Wash your hands with soap and water before you change your dressing. If soap and water are not available, use alcohol-based hand sanitizer. ? Change your dressing as told by your health care provider. ? Place any used dressings or infusion bags into a plastic bag. Throw that bag in the trash. ? Keep the dressing that covers the needle clean and dry. Do not get it wet. ? Do not use scissors or sharp objects near the tube. ? Keep the tube clamped,  unless it is being used.  Check your port site every day for signs of infection. Check for: ? Redness, swelling, or pain. ? Fluid or blood. ? Pus or a bad smell.  Protect the skin around the port site. ? Avoid wearing bra straps that rub or irritate the site. ? Protect the skin around your port from seat belts. Place a soft pad over your chest if needed.  Bathe or shower as told by your health care provider. The site may get wet as long as you are not actively receiving an infusion.  Return to your normal activities as told by your health care provider. Ask your health care provider what activities are safe for you.  Carry a medical alert card or wear a medical alert bracelet at all times. This will let health care providers know that you have an implanted port in case of an emergency. Get help right away if:  You have redness, swelling, or pain at the port site.  You have fluid or blood coming from your port site.  You have pus or a bad smell coming from the port site.  You have a fever. Summary  Implanted ports are usually placed in the chest for long-term IV access.  Follow instructions from your health care provider about flushing the port and changing bandages (dressings).  Take care of the area around your port by avoiding clothing that puts pressure on the area, and by watching for signs of infection.  Protect the skin around your port from seat belts. Place a soft pad over your chest if needed.  Get help right away if you have a fever or you have redness, swelling, pain, drainage, or a bad smell at the port site. This information is not intended to replace advice given to you by your health care provider. Make sure you discuss any questions you have with your health care provider. Document Released: 10/13/2005 Document Revised: 11/15/2016 Document Reviewed: 11/15/2016 Elsevier Interactive Patient Education  2019 [employer].

## 2019-03-29 NOTE — Progress Notes (Signed)
[facility] History and Physical  Reason for Referral: Port a catheter placement  Referring Physician:  Dr. Delton Coombes   Chief Complaint    Pre-op Exam      Tara Aguilar is a 38 y.o. female.  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 The patient had a history of an abscessed tooth and received treatment antibiotics and steroids, and ultimately was note improving and underwent a CT that demonstrated retropharyngeal fluid and cervical adenopathy that promoted admission due to worsening shortness of breath and swelling of her neck. She ultimately had her retropharyngeal fluid collection drained by Dr. Benjamine Mola and had a biopsy by IR which demonstrated squamous cell cancer that is thought to be a lung primary due to her smoking history.  Despite her radiation while inpatient for her SVC syndrome, she has continued to have severe orthopnea, and has called into the Cancer clinic several times. She has been treated with steroids, but says her swelling in her neck feels worse and she is unable to lay back for extended periods of time.  She is able to lay flat for radiation for about 10 minutes, but not longer. She says that she has been told by Anesthesia after her procedures at [facility] that she was a difficult airway.     Past Medical History:  Diagnosis Date   Depression    Glucose intolerance 03/11/2019   Obesity, Class III, BMI 40-49.9 (morbid obesity) ([place]) 03/11/2019    Past Surgical History:  Procedure Laterality Date   CESAREAN SECTION     IRRIGATION AND DEBRIDEMENT ABSCESS  03/15/2019   RETROPHARYNGEAL    TONSILLECTOMY AND ADENOIDECTOMY N/A 03/15/2019   Procedure: IRRIGATION AND DEBRIDEMENT OF RETROPHARYNGEAL ABSCESS;  Surgeon: Leta Baptist, MD;  Location: [facility] OR;  Service: ENT;  Laterality:  N/A;    Family History  Problem Relation Age of Onset   Lung cancer Mother    Hypertension Mother    Leukemia Father    Myelodysplastic syndrome Father     Social History   Tobacco Use   Smoking status: Current Every Day Smoker    Packs/day: 1.00    Years: 25.00    Pack years: 25.00    Types: Cigarettes   Smokeless tobacco: Never Used  Substance Use Topics   Alcohol use: Not Currently    Comment: social    Drug use: Not Currently    Medications: I have reviewed the patient's current medications. Allergies as of 03/29/2019   No Known Allergies     Medication List       Accurate as of March 29, 2019 12:03 PM. If you have any questions, ask your nurse or doctor.        buPROPion 150 MG 24 hr tablet Commonly known as:  WELLBUTRIN XL Take 150 mg by mouth daily.   furosemide 40 MG tablet Commonly known as:  Lasix Take 1 tablet (40 mg total) by mouth daily for 14 days. What changed:  how much to take   ibuprofen 400 MG tablet Commonly known as:  ADVIL Take 800 mg by mouth every 6 (six) hours as needed.   lidocaine 5 % Commonly known as:  LIDODERM Place 1 patch onto the skin daily. Remove & Discard patch within 12 hours or as directed by MD   oxyCODONE  5 MG immediate release tablet Commonly known as:  Oxy IR/ROXICODONE Take one tab po 30 minutes prior to XRT and po qHS        ROS:  A comprehensive review of systems was negative except for: Eyes: positive for blurred vision Respiratory: positive for SOB, orthopnea Musculoskeletal: positive for neck pain, stiff joints and shoulder pain, swelling Neurological: positive for dizziness  Blood pressure 138/85, pulse (!) 107, temperature 98.2 F (36.8 C), temperature source Temporal, resp. rate 18, height 5\' 6"  (1.676 m), weight 297 lb (134.7 kg), SpO2 93 %. Physical Exam Vitals signs reviewed.  Constitutional:      Appearance: She is obese. She is ill-appearing.  HENT:     Head: Normocephalic.      Nose: Nose normal.  Eyes:     Pupils: Pupils are equal, round, and reactive to light.  Neck:     Musculoskeletal: Edema and pain with movement present.     Comments: Severely swollen neck and upper torso, unable to identify clavicles, neck with tense skin and large mass effect Cardiovascular:     Rate and Rhythm: Regular rhythm. Tachycardia present.  Pulmonary:     Effort: Pulmonary effort is normal.     Breath sounds: Normal breath sounds.  Abdominal:     General: There is no distension.     Palpations: Abdomen is soft.     Tenderness: There is no abdominal tenderness.  Skin:    General: Skin is warm and dry.  Neurological:     General: No focal deficit present.     Mental Status: She is alert and oriented to person, place, and time.  Psychiatric:        Mood and Affect: Mood normal.        Behavior: Behavior normal.        Thought Content: Thought content normal.        Judgment: Judgment normal.    Results: CT chest / neck from 5/15 reviewed- bulky nodes, compression of right brachiocephalic/ SVC; subclavian on the left at least 8cm from the skin to under the clavicle, neck region not as swollen in the soft tissue 5/15 as I would image an image today would be   Assessment & Plan:  Tara Aguilar is a 38 y.o. female with metastatic non small cell lung cancer based on her pathology and history of smoking. She has received radiation for treatment of SVC syndrome but remains quite symptomatic and has a very large and edematous neck and edematous upper body. I am unable to palpable clavicles and measurements from CT 5/15 were 8cm to the vessels under the clavicle.  Her neck appears more swollen today than even in the CT from prior.  She reports being scheduled for more imaging.  She also reports being a difficult airway. Given the patient's size, the patient's swelling and depth of her vessels, the neck edema and symptoms of orthopnea and inability to lay flat coupled with reports of a  difficult airway, I do not think she is an appropriate candidate for a port a [facility] where there are limited resource with regards to 1 anesthesiologist. I worry that she would need intubation for her procedure and that this would lead to difficulty with extubation.  I am also afraid that technically my kits would allow for a depth of 8cm to the vessel with the accessing needle and syringe.    -For Safety reason, I have spoken to Amy at the [facility] and recommended referral  to IR for port placement. They may even say that she needs to start treatment with PIV or PICC given the extent of her swelling and symptoms.  This procedure being done at [facility] will give access to more anesthesia and more providers to aid in placement safely and care afterwards if there are any issues.  -I explained this to the patient and she expressed understanding.   All questions were answered to the satisfaction of the patient.  Future Appointments  Date Time Provider [place]  03/31/2019  8:30 AM [facility] LINAC 4 [facility]RAD[facility] None  03/31/2019 11:00 AM [facility]-IR 1 [facility]-IR [facility]  04/04/2019 11:00 AM [facility] PET CT 1 [facility]-NM [place]  04/05/2019  8:00 AM [facility]-MR 1 AP-MRI [place] [facility]  04/06/2019  8:00 AM AP-ACAPA LAB AP-ACAPA None  04/06/2019  8:30 AM Derek Jack, MD AP-ACAPA None  04/06/2019  9:00 AM AP-ACAPA CHEMO CLASS AP-ACAPA None  04/06/2019  9:00 AM AP-ACAPA CHAIR 1 AP-ACAPA None     Virl Cagey 03/29/2019, 12:03 PM

## 2019-03-30 ENCOUNTER — Other Ambulatory Visit: Payer: Self-pay | Admitting: Student

## 2019-03-30 ENCOUNTER — Encounter (HOSPITAL_COMMUNITY): Payer: Self-pay | Admitting: *Deleted

## 2019-03-30 ENCOUNTER — Other Ambulatory Visit: Payer: Self-pay

## 2019-03-30 ENCOUNTER — Ambulatory Visit (HOSPITAL_COMMUNITY): Payer: BC Managed Care – PPO | Admitting: Hematology

## 2019-03-30 ENCOUNTER — Other Ambulatory Visit: Payer: Self-pay | Admitting: Radiology

## 2019-03-30 ENCOUNTER — Ambulatory Visit
Admission: RE | Admit: 2019-03-30 | Discharge: 2019-03-30 | Disposition: A | Discharge status: Home / Self Care | Payer: BC Managed Care – PPO | Source: Ambulatory Visit | Attending: Radiation Oncology | Admitting: Radiation Oncology

## 2019-03-30 ENCOUNTER — Encounter: Payer: Self-pay | Admitting: General Practice

## 2019-03-30 DIAGNOSIS — Z510 Encounter for antineoplastic radiation therapy: Secondary | ICD-10-CM | POA: Diagnosis not present

## 2019-03-30 NOTE — Progress Notes (Signed)
Patient's sister called stating that patient's pain has progressively been getting worse and the swelling in her neck is getting worse instead of better. I asked if patient was c/o any SOB or difficulty breathing and she denies that complaint.   I spoke with Dr. Delton Coombes and since the medrol dose pack that was given 3 days ago hasn't started to help he wants her to go to the ER for evaluation and scans as needed.    I returned call to patient's sister and advised of the above, she verbalizes understanding and will call patient immediately.

## 2019-03-30 NOTE — Progress Notes (Signed)
[facility] CSW Progress Notes  Call to patient at request of providers - brief assessment for parenting and support needs.  Patient has been in significant pain, difficulty sleeping.  "I am still taking it all in", understandable shock and distress due to recent diagnosis of cancer herself and death of husband/father of son last year from cancer also.  States she has support from sister and friends "who check on me every day."  Concerned about son and impact of death of father and her diagnosis.  Will send information packet of parenting resources and follow up w her in two weeks to discuss.  Edwyna Shell, LCSW Clinical Social Worker Phone:  [phone]

## 2019-03-31 ENCOUNTER — Encounter: Payer: Self-pay | Admitting: Radiation Oncology

## 2019-03-31 ENCOUNTER — Other Ambulatory Visit (HOSPITAL_COMMUNITY): Payer: Self-pay | Admitting: Hematology

## 2019-03-31 ENCOUNTER — Ambulatory Visit (HOSPITAL_COMMUNITY)
Admission: RE | Admit: 2019-03-31 | Discharge: 2019-03-31 | Disposition: A | Discharge status: Home / Self Care | Payer: BC Managed Care – PPO | Source: Ambulatory Visit | Attending: Hematology | Admitting: Hematology

## 2019-03-31 ENCOUNTER — Ambulatory Visit
Admission: RE | Admit: 2019-03-31 | Discharge: 2019-03-31 | Disposition: A | Discharge status: Home / Self Care | Payer: BC Managed Care – PPO | Source: Ambulatory Visit | Attending: Radiation Oncology | Admitting: Radiation Oncology

## 2019-03-31 ENCOUNTER — Other Ambulatory Visit: Payer: Self-pay

## 2019-03-31 DIAGNOSIS — C779 Secondary and unspecified malignant neoplasm of lymph node, unspecified: Secondary | ICD-10-CM | POA: Diagnosis not present

## 2019-03-31 DIAGNOSIS — R0602 Shortness of breath: Secondary | ICD-10-CM | POA: Diagnosis not present

## 2019-03-31 DIAGNOSIS — C801 Malignant (primary) neoplasm, unspecified: Secondary | ICD-10-CM | POA: Diagnosis not present

## 2019-03-31 DIAGNOSIS — R590 Localized enlarged lymph nodes: Secondary | ICD-10-CM | POA: Insufficient documentation

## 2019-03-31 DIAGNOSIS — Z510 Encounter for antineoplastic radiation therapy: Secondary | ICD-10-CM | POA: Diagnosis not present

## 2019-03-31 DIAGNOSIS — I871 Compression of vein: Secondary | ICD-10-CM | POA: Diagnosis not present

## 2019-03-31 LAB — PROTIME-INR
INR: 1.2 (ref 0.8–1.2)
Prothrombin Time: 14.9 seconds (ref 11.4–15.2)

## 2019-03-31 LAB — CBC WITH DIFFERENTIAL/PLATELET
Abs Immature Granulocytes: 0.17 10*3/uL — ABNORMAL HIGH (ref 0.00–0.07)
Basophils Absolute: 0 10*3/uL (ref 0.0–0.1)
Basophils Relative: 0 %
Eosinophils Absolute: 0.1 10*3/uL (ref 0.0–0.5)
Eosinophils Relative: 1 %
HCT: 41.3 % (ref 36.0–46.0)
Hemoglobin: 12.8 g/dL (ref 12.0–15.0)
Immature Granulocytes: 1 %
Lymphocytes Relative: 3 %
Lymphs Abs: 0.5 10*3/uL — ABNORMAL LOW (ref 0.7–4.0)
MCH: 27.9 pg (ref 26.0–34.0)
MCHC: 31 g/dL (ref 30.0–36.0)
MCV: 90.2 fL (ref 80.0–100.0)
Monocytes Absolute: 0.8 10*3/uL (ref 0.1–1.0)
Monocytes Relative: 6 %
Neutro Abs: 12.4 10*3/uL — ABNORMAL HIGH (ref 1.7–7.7)
Neutrophils Relative %: 89 %
Platelets: 266 10*3/uL (ref 150–400)
RBC: 4.58 MIL/uL (ref 3.87–5.11)
RDW: 15.4 % (ref 11.5–15.5)
WBC: 13.9 10*3/uL — ABNORMAL HIGH (ref 4.0–10.5)
nRBC: 0 % (ref 0.0–0.2)

## 2019-03-31 LAB — BASIC METABOLIC PANEL
Anion gap: 11 (ref 5–15)
BUN: 23 mg/dL — ABNORMAL HIGH (ref 6–20)
CO2: 27 mmol/L (ref 22–32)
Calcium: 9.6 mg/dL (ref 8.9–10.3)
Chloride: 103 mmol/L (ref 98–111)
Creatinine, Ser: 1.03 mg/dL — ABNORMAL HIGH (ref 0.44–1.00)
GFR calc Af Amer: >60 mL/min (ref >60)
GFR calc non Af Amer: >60 mL/min (ref >60)
Glucose, Bld: 119 mg/dL — ABNORMAL HIGH (ref 70–99)
Potassium: 3.7 mmol/L (ref 3.5–5.1)
Sodium: 141 mmol/L (ref 135–145)

## 2019-03-31 LAB — PREGNANCY, URINE: Preg Test, Ur: NEGATIVE

## 2019-03-31 MED ORDER — HEPARIN SOD (PORK) LOCK FLUSH 100 UNIT/ML IV SOLN
INTRAVENOUS | Status: AC
  Filled 2019-03-31: qty 5

## 2019-03-31 MED ORDER — HEPARIN SOD (PORK) LOCK FLUSH 100 UNIT/ML IV SOLN
INTRAVENOUS | Status: AC
  Administered 2019-03-31: 400 [IU]
  Filled 2019-03-31: qty 10

## 2019-03-31 MED ORDER — LIDOCAINE HCL 1 % IJ SOLN
INTRAMUSCULAR | Status: AC
  Filled 2019-03-31: qty 20

## 2019-03-31 MED ORDER — LIDOCAINE HCL (PF) 1 % IJ SOLN
INTRAMUSCULAR | Status: DC | PRN
  Administered 2019-03-31: 10 mL

## 2019-03-31 MED ORDER — SODIUM CHLORIDE 0.9 % IV SOLN: INTRAVENOUS | Status: DC

## 2019-03-31 MED ORDER — LIDOCAINE-EPINEPHRINE (PF) 1 %-1:200000 IJ SOLN
INTRAMUSCULAR | Status: AC
  Filled 2019-03-31: qty 30

## 2019-03-31 MED ORDER — CEFAZOLIN SODIUM-DEXTROSE 2-4 GM/100ML-% IV SOLN: 2.0000 g | INTRAVENOUS | Status: DC

## 2019-03-31 NOTE — H&P (Signed)
Patient Status: [facility] - Out-pt  Assessment and Plan: Patient in need of venous access for initiation of chemotherapy.  Patient with extensive neck lymphadenopathy causing SVC syndrome.  She has undergone XRT for treatment of her SVC, but remains symptomatic with significant edema in neck and arms bilaterally.   Dr. Annamaria Boots has reviewed case and recommends proceeding with PICC placement for treatment initiation.  Spoke with Dr. Delton Coombes to confirm PICC is acceptable.  He agrees.   Proceed with PICC placement in IR today.   ______________________________________________________________________   History of Present Illness: Tara Aguilar is a 38 y.o. female with history of metastatic squamous cell carcinoma suspected from lung.  She has a large degree of swelling in her neck and arms due to SVC syndrome which has not been relieved by radiation treatment.  She presents for Port-A-Cath vs. PICC placement today.  She has been NPO.  She denies fever, but reports shortness of breath, cough, and difficulty lying flat. She does not take blood thinners.   Allergies and medications reviewed.   Review of Systems: A 12 point ROS discussed and pertinent positives are indicated in the HPI above.  All other systems are negative.  Review of Systems  Constitutional: Negative for fatigue and fever.  Respiratory: Positive for shortness of breath.   Cardiovascular: Negative for chest pain.  Gastrointestinal: Negative for abdominal pain, nausea and vomiting.  Psychiatric/Behavioral: Negative for behavioral problems and confusion.    Vital Signs: BP 107/67   Pulse (!) 106   Temp (!) 97.5 F (36.4 C) (Oral)   Resp 18   Ht 5\' 6"  (1.676 m)   Wt 290 lb (131.5 kg)   LMP 03/26/2019 (Exact Date)   SpO2 98%   BMI 46.81 kg/m   Physical Exam Constitutional:      Appearance: Normal appearance.  HENT:     Mouth/Throat:     Mouth: Mucous membranes are moist.     Pharynx: Oropharynx is clear.   Cardiovascular:     Rate and Rhythm: Normal rate and regular rhythm.  Pulmonary:     Effort: Pulmonary effort is normal. No respiratory distress.     Breath sounds: Normal breath sounds.  Musculoskeletal:        General: Swelling (bilateral upper extremity swelling and edema) present.  Lymphadenopathy:     Cervical: Cervical adenopathy (severe edema of the neck and chest) present.  Skin:    General: Skin is warm and dry.  Neurological:     General: No focal deficit present.     Mental Status: She is alert and oriented to person, place, and time. Mental status is at baseline.  Psychiatric:        Mood and Affect: Mood normal.        Behavior: Behavior normal.        Thought Content: Thought content normal.        Judgment: Judgment normal.      Imaging reviewed.   Labs:  COAGS: Recent Labs    03/13/19 1420 03/31/19 1001  INR 1.3* 1.2    BMP: Recent Labs    03/17/19 0245 03/18/19 0634 03/21/19 0535 03/22/19 0532 03/31/19 1001  NA 138  --  138 136 141  K 4.1  --  4.5 4.5 3.7  CL 104  --  101 101 103  CO2 26  --  27 26 27   GLUCOSE 113*  --  144* 160* 119*  BUN 16  --  34* 40* 23*  CALCIUM 9.4  --  10.4* 9.9 9.6  CREATININE 1.12* 1.04* 1.15* 1.18* 1.03*  GFRNONAA >60 >60 60* 58* >60  GFRAA >60 >60 >60 >60 >60       Electronically Signed: Docia Barrier, PA 03/31/2019, 11:22 AM   I spent a total of 15 minutes in face to face in clinical consultation, greater than 50% of which was counseling/coordinating care for venous access.

## 2019-03-31 NOTE — Procedures (Signed)
SCCA of the nodes, symptomatic SVC syndrome  S/p LUE DL POWER PICC  Tip svcra  no comp Stable ebl 0 Ready for use

## 2019-04-01 ENCOUNTER — Emergency Department (HOSPITAL_COMMUNITY): Payer: BC Managed Care – PPO

## 2019-04-01 ENCOUNTER — Emergency Department (HOSPITAL_COMMUNITY)
Admission: EM | Admit: 2019-04-01 | Discharge: 2019-04-01 | Disposition: A | Discharge status: Home / Self Care | Payer: BC Managed Care – PPO | Attending: Emergency Medicine | Admitting: Emergency Medicine

## 2019-04-01 ENCOUNTER — Encounter (HOSPITAL_COMMUNITY): Payer: Self-pay

## 2019-04-01 ENCOUNTER — Other Ambulatory Visit: Payer: Self-pay

## 2019-04-01 ENCOUNTER — Telehealth: Payer: Self-pay | Admitting: Hematology

## 2019-04-01 DIAGNOSIS — Z79899 Other long term (current) drug therapy: Secondary | ICD-10-CM | POA: Diagnosis not present

## 2019-04-01 DIAGNOSIS — F17210 Nicotine dependence, cigarettes, uncomplicated: Secondary | ICD-10-CM | POA: Insufficient documentation

## 2019-04-01 DIAGNOSIS — R0602 Shortness of breath: Secondary | ICD-10-CM | POA: Insufficient documentation

## 2019-04-01 DIAGNOSIS — R609 Edema, unspecified: Secondary | ICD-10-CM | POA: Insufficient documentation

## 2019-04-01 DIAGNOSIS — Z85118 Personal history of other malignant neoplasm of bronchus and lung: Secondary | ICD-10-CM | POA: Insufficient documentation

## 2019-04-01 LAB — CBC WITH DIFFERENTIAL/PLATELET
Abs Immature Granulocytes: 0.08 10*3/uL — ABNORMAL HIGH (ref 0.00–0.07)
Basophils Absolute: 0 10*3/uL (ref 0.0–0.1)
Basophils Relative: 0 %
Eosinophils Absolute: 0.1 10*3/uL (ref 0.0–0.5)
Eosinophils Relative: 0 %
HCT: 39.9 % (ref 36.0–46.0)
Hemoglobin: 12.8 g/dL (ref 12.0–15.0)
Immature Granulocytes: 1 %
Lymphocytes Relative: 4 %
Lymphs Abs: 0.5 10*3/uL — ABNORMAL LOW (ref 0.7–4.0)
MCH: 28.5 pg (ref 26.0–34.0)
MCHC: 32.1 g/dL (ref 30.0–36.0)
MCV: 88.9 fL (ref 80.0–100.0)
Monocytes Absolute: 0.7 10*3/uL (ref 0.1–1.0)
Monocytes Relative: 6 %
Neutro Abs: 11 10*3/uL — ABNORMAL HIGH (ref 1.7–7.7)
Neutrophils Relative %: 89 %
Platelets: 217 10*3/uL (ref 150–400)
RBC: 4.49 MIL/uL (ref 3.87–5.11)
RDW: 15.3 % (ref 11.5–15.5)
WBC: 12.4 10*3/uL — ABNORMAL HIGH (ref 4.0–10.5)
nRBC: 0 % (ref 0.0–0.2)

## 2019-04-01 LAB — BASIC METABOLIC PANEL
Anion gap: 9 (ref 5–15)
BUN: 18 mg/dL (ref 6–20)
CO2: 27 mmol/L (ref 22–32)
Calcium: 9.1 mg/dL (ref 8.9–10.3)
Chloride: 102 mmol/L (ref 98–111)
Creatinine, Ser: 0.99 mg/dL (ref 0.44–1.00)
GFR calc Af Amer: >60 mL/min (ref >60)
GFR calc non Af Amer: >60 mL/min (ref >60)
Glucose, Bld: 119 mg/dL — ABNORMAL HIGH (ref 70–99)
Potassium: 3.8 mmol/L (ref 3.5–5.1)
Sodium: 138 mmol/L (ref 135–145)

## 2019-04-01 MED ORDER — IOHEXOL 350 MG/ML SOLN
100.0000 mL | Freq: Once | INTRAVENOUS | Status: AC | PRN
  Administered 2019-04-01: 100 mL via INTRAVENOUS

## 2019-04-01 NOTE — Discharge Instructions (Signed)
See your Oncologist as scheduled

## 2019-04-01 NOTE — ED Notes (Signed)
Pt ambulated to the restroom with out difficulty.

## 2019-04-01 NOTE — ED Provider Notes (Signed)
[place] EMERGENCY DEPARTMENT Provider Note   CSN: [id number] Arrival date & time: 04/01/19  1431    History   Chief Complaint Chief Complaint  Patient presents with   Shortness of Breath    HPI Tara Aguilar is a 38 y.o. female.     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  She is undergone urgent radiation to shrink tumor size and has a new PICC line in her left upper extremity for plans to initiate chemotherapy in the coming week.  She presents the ER today with increasing complaints of shortness of breath worse with exertion.  Denies significant orthopnea.  No fevers or chills.  No productive cough.  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   Glucose intolerance 03/11/2019   Obesity, Class III, BMI 40-49.9 (morbid obesity) ([place]) 03/11/2019   Tobacco use 03/11/2019   Dental abscess 03/07/2019   Retropharyngeal abscess 03/05/2019   Borderline diabetes 03/05/2019   Depression     Past Surgical History:  Procedure Laterality Date   CESAREAN SECTION     IRRIGATION AND DEBRIDEMENT ABSCESS  03/15/2019   RETROPHARYNGEAL    TONSILLECTOMY AND ADENOIDECTOMY N/A 03/15/2019   Procedure: IRRIGATION AND DEBRIDEMENT OF RETROPHARYNGEAL ABSCESS;  Surgeon: Leta Baptist, MD;  Location: [facility] OR;  Service: ENT;  Laterality: N/A;     OB History   No obstetric history on file.      Home Medications    Prior to Admission medications   Medication Sig Start Date End Date Taking? Authorizing Provider  buPROPion (WELLBUTRIN XL) 150  MG 24 hr tablet Take 150 mg by mouth daily.   Yes [provider]  furosemide (LASIX) 40 MG tablet Take 1 tablet (40 mg total) by mouth daily for 14 days. 03/22/19 04/05/19 Yes Shelly Coss, MD  ibuprofen (ADVIL) 400 MG tablet Take 800 mg by mouth every 6 (six) hours as needed (pain).    Yes [provider]  methylPREDNISolone (MEDROL DOSEPAK) 4 MG TBPK tablet Take 4 mg by mouth See admin instructions. 6 day tapered dose   Yes [provider]  oxyCODONE (OXY IR/ROXICODONE) 5 MG immediate release tablet Take one tab po 30 minutes prior to XRT and po qHS Patient taking differently: Take 5 mg by mouth 2 (two) times a day.  03/29/19  Yes Hayden Pedro, PA-C    Family History Family History  Problem Relation Age of Onset   Lung cancer Mother    Hypertension Mother    Leukemia Father    Myelodysplastic syndrome Father     Social History Social History   Tobacco Use   Smoking status: Current Every Day Smoker    Packs/day: 1.00    Years: 25.00    Pack years: 25.00    Types: Cigarettes   Smokeless tobacco: Never Used  Substance Use Topics   Alcohol use: Not Currently    Comment: social    Drug use: Not Currently     Allergies   Patient has no known allergies.   Review of Systems Review of Systems  All other systems reviewed and are negative.    Physical Exam Updated Vital Signs BP (!) 109/94    Pulse (!) 107    Temp 98.2 F (36.8 C) (Oral)    Resp 10    LMP 03/26/2019 (Exact Date)    SpO2 96%   Physical Exam Vitals signs and nursing note reviewed.  Constitutional:      General: She is not in acute distress.    Appearance: She is well-developed.  HENT:     Head: Normocephalic and atraumatic.  Neck:     Musculoskeletal: Normal range of motion.     Comments: Anterior neck swelling consistent with her baseline.  No stridor Cardiovascular:     Rate and Rhythm: Normal rate and regular rhythm.     Heart sounds: Normal heart  sounds.  Pulmonary:     Effort: Pulmonary effort is normal.     Breath sounds: Normal breath sounds.  Abdominal:     General: There is no distension.     Palpations: Abdomen is soft.     Tenderness: There is no abdominal tenderness.  Musculoskeletal: Normal range of motion.     Comments: Edema of her bilateral upper extremities  Skin:    General: Skin is warm and dry.  Neurological:     Mental Status: She is alert and oriented to person, place, and time.  Psychiatric:        Judgment: Judgment normal.      ED Treatments / Results  Labs (all labs ordered are listed, but only abnormal results are displayed) Labs Reviewed  CBC WITH DIFFERENTIAL/PLATELET - Abnormal; Notable for the following components:      Result Value   WBC 12.4 (*)    Neutro Abs 11.0 (*)    Lymphs Abs 0.5 (*)    Abs Immature Granulocytes 0.08 (*)    All other components within normal limits  BASIC METABOLIC PANEL - Abnormal; Notable for the following components:   Glucose, Bld 119 (*)    All other components within normal limits    EKG EKG Interpretation  Date/Time:  Friday April 01 2019 14:43:34 EDT Ventricular Rate:  116 PR Interval:  112 QRS Duration: 76 QT Interval:  310 QTC Calculation: 430 R Axis:   82 Text Interpretation:  Sinus tachycardia Low voltage QRS Cannot rule out Anterior infarct , age undetermined Abnormal ECG No old tracing to compare Confirmed by Jola Schmidt [phone]) on 04/01/2019 6:44:29 PM   Radiology Dg Chest 2 View  Result Date: 04/01/2019 CLINICAL DATA:  Shortness of breath. Upper extremity swelling. SVC syndrome. EXAM: CHEST - 2 VIEW COMPARISON:  CT chest dated Mar 11, 2019. Chest x-ray dated Mar 05, 2019. FINDINGS: New left upper extremity PICC line with tip in the proximal right atrium. Normal heart size. Right paratracheal lymphadenopathy appears slightly decreased in size. Unchanged elevation of the right hemidiaphragm with right lower lobe atelectasis. No focal  consolidation, pleural effusion, or pneumothorax. No acute osseous abnormality. IMPRESSION: 1.  No active cardiopulmonary disease. 2. Right paratracheal lymphadenopathy appears slightly decreased in size. Electronically Signed   By: Titus Dubin M.D.   On: 04/01/2019 17:10   Irpicc Placement Left >5 Yrs Inc Img Guide  Result Date: 03/31/2019 INDICATION: Squamous cell carcinoma node access chemotherapy, SVC syndrome EXAM: ULTRASOUND AND FLUOROSCOPIC GUIDED PICC LINE INSERTION MEDICATIONS: 1% lidocaine local CONTRAST:  None FLUOROSCOPY TIME:  Forty-eight seconds (13 mGy) COMPLICATIONS: None immediate. TECHNIQUE: The procedure, risks, benefits, and alternatives were explained to the patient  and informed written consent was obtained. A timeout was performed prior to the initiation of the procedure. The left upper extremity was prepped with chlorhexidine in a sterile fashion, and a sterile drape was applied covering the operative field. Maximum barrier sterile technique with sterile gowns and gloves were used for the procedure. A timeout was performed prior to the initiation of the procedure. Local anesthesia was provided with 1% lidocaine. Under direct ultrasound guidance, the left basilic vein was accessed with a micropuncture kit after the overlying soft tissues were anesthetized with 1% lidocaine. An ultrasound image was saved for documentation purposes. A guidewire was advanced to the level of the superior caval-atrial junction for measurement purposes and the PICC line was cut to length. A peel-away sheath was placed and a 46 cm, 5 [place], dual lumen was inserted to level of the superior caval-atrial junction. A post procedure spot fluoroscopic was obtained. The catheter easily aspirated and flushed and was sutured in place. A dressing was placed. The patient tolerated the procedure well without immediate post procedural complication. FINDINGS: After catheter placement, the tip lies within the superior  cavoatrial junction. The catheter aspirates and flushes normally and is ready for immediate use. IMPRESSION: Successful ultrasound and fluoroscopic guided placement of a left basilic vein approach, 46 cm, 5 French, dual lumen PICC with tip at the superior caval-atrial junction. The PICC line is ready for immediate use. Electronically Signed   By: Jerilynn Mages.  Shick M.D.   On: 03/31/2019 12:08    Procedures Procedures (including critical care time)  Medications Ordered in ED Medications  iohexol (OMNIPAQUE) 350 MG/ML injection 100 mL (100 mLs Intravenous Contrast Given 04/01/19 1805)     Initial Impression / Assessment and Plan / ED Course  I have reviewed the triage vital signs and the nursing notes.  Pertinent labs & imaging results that were available during my care of the patient were reviewed by me and considered in my medical decision making (see chart for details).        Plan for CT angios chest to evaluate for pulmonary embolism or other pathology to explain her complaints of shortness of breath.  Her labs are otherwise without significant abnormality.  She has no hypoxia or increased work of breathing at this time.  I suspect of her CT angios is normal the patient can be ambulated around the emergency department to make sure she maintains her O2 saturation.  If her CT angiogram is negative for acute pathology and she is able to maintain her O2 saturation I feels that the patient is stable for discharge to follow-up with her oncology and primary care teams.  Care transferred to [place]  Final Clinical Impressions(s) / ED Diagnoses   Final diagnoses:  None    ED Discharge Orders    None       Jola Schmidt, MD 04/01/19 [phone]

## 2019-04-01 NOTE — ED Provider Notes (Signed)
Pt's care assumed by me at 7pm.   Ct scan pending.  Ct shows no PE.  Pt able to ambulate to bathroom with minimal assistance.   Pt counseled on results.  Pt advised to follow up with her Oncologist as scheduled.    Tara Aguilar 04/01/19 2035    Jola Schmidt, MD 04/04/19 [phone]

## 2019-04-01 NOTE — ED Triage Notes (Signed)
Pt reports increased SOB and generalized swelling "all over" for the past few weeks. Pt has PICC line in left upper arm to start chemo soon, unknown what kind of cancer pt has. Pt seems fatigued in triage.

## 2019-04-04 ENCOUNTER — Other Ambulatory Visit: Payer: Self-pay

## 2019-04-04 ENCOUNTER — Ambulatory Visit (HOSPITAL_COMMUNITY)
Admission: RE | Admit: 2019-04-04 | Discharge: 2019-04-04 | Disposition: A | Discharge status: Home / Self Care | Payer: BC Managed Care – PPO | Source: Ambulatory Visit | Attending: Hematology | Admitting: Hematology

## 2019-04-04 ENCOUNTER — Encounter (HOSPITAL_COMMUNITY): Payer: BC Managed Care – PPO

## 2019-04-04 DIAGNOSIS — C779 Secondary and unspecified malignant neoplasm of lymph node, unspecified: Secondary | ICD-10-CM | POA: Diagnosis not present

## 2019-04-04 MED ORDER — FLUDEOXYGLUCOSE F - 18 (FDG) INJECTION
15.8000 | Freq: Once | INTRAVENOUS | Status: AC | PRN
  Administered 2019-04-04: 15.8 via INTRAVENOUS

## 2019-04-05 ENCOUNTER — Other Ambulatory Visit (HOSPITAL_COMMUNITY): Payer: Self-pay | Admitting: Emergency Medicine

## 2019-04-05 ENCOUNTER — Ambulatory Visit (HOSPITAL_COMMUNITY)
Admission: RE | Admit: 2019-04-05 | Discharge: 2019-04-05 | Disposition: A | Discharge status: Home / Self Care | Payer: BC Managed Care – PPO | Source: Ambulatory Visit | Attending: Hematology | Admitting: Hematology

## 2019-04-05 ENCOUNTER — Other Ambulatory Visit: Payer: Self-pay

## 2019-04-05 DIAGNOSIS — C3490 Malignant neoplasm of unspecified part of unspecified bronchus or lung: Secondary | ICD-10-CM | POA: Insufficient documentation

## 2019-04-05 DIAGNOSIS — Z7189 Other specified counseling: Secondary | ICD-10-CM | POA: Insufficient documentation

## 2019-04-05 DIAGNOSIS — C779 Secondary and unspecified malignant neoplasm of lymph node, unspecified: Secondary | ICD-10-CM

## 2019-04-05 NOTE — Progress Notes (Signed)
START ON PATHWAY REGIMEN - Non-Small Cell Lung     A cycle is every 21 days:     Pembrolizumab      Paclitaxel      Carboplatin   **Always confirm dose/schedule in your pharmacy ordering system**  Patient Characteristics: Stage IV Metastatic, Squamous, PS = 0, 1, First Line, PD-L1 Expression Positive 1-49% (TPS) / Negative / Not Tested / Awaiting Test Results and Immunotherapy Candidate AJCC T Category: TX Current Disease Status: Distant Metastases AJCC N Category: NX AJCC M Category: M1c AJCC 8 Stage Grouping: IVB Histology: Squamous Cell Line of therapy: First Line ECOG Performance Status: 0 PD-L1 Expression Status: Awaiting Test Results Immunotherapy Candidate Status: Candidate for Immunotherapy Intent of Therapy: Non-Curative / Palliative Intent, Discussed with Patient

## 2019-04-06 ENCOUNTER — Ambulatory Visit (HOSPITAL_COMMUNITY): Payer: BC Managed Care – PPO

## 2019-04-06 ENCOUNTER — Inpatient Hospital Stay (HOSPITAL_COMMUNITY): Payer: BC Managed Care – PPO

## 2019-04-06 ENCOUNTER — Encounter (HOSPITAL_COMMUNITY): Payer: Self-pay | Admitting: Hematology

## 2019-04-06 ENCOUNTER — Inpatient Hospital Stay (HOSPITAL_COMMUNITY): Payer: BC Managed Care – PPO | Attending: Hematology | Admitting: Hematology

## 2019-04-06 DIAGNOSIS — Z7189 Other specified counseling: Secondary | ICD-10-CM

## 2019-04-06 DIAGNOSIS — Z8249 Family history of ischemic heart disease and other diseases of the circulatory system: Secondary | ICD-10-CM | POA: Insufficient documentation

## 2019-04-06 DIAGNOSIS — Z8269 Family history of other diseases of the musculoskeletal system and connective tissue: Secondary | ICD-10-CM | POA: Diagnosis not present

## 2019-04-06 DIAGNOSIS — F17210 Nicotine dependence, cigarettes, uncomplicated: Secondary | ICD-10-CM | POA: Insufficient documentation

## 2019-04-06 DIAGNOSIS — E7439 Other disorders of intestinal carbohydrate absorption: Secondary | ICD-10-CM | POA: Diagnosis not present

## 2019-04-06 DIAGNOSIS — Z923 Personal history of irradiation: Secondary | ICD-10-CM | POA: Insufficient documentation

## 2019-04-06 DIAGNOSIS — C779 Secondary and unspecified malignant neoplasm of lymph node, unspecified: Secondary | ICD-10-CM

## 2019-04-06 DIAGNOSIS — Z5189 Encounter for other specified aftercare: Secondary | ICD-10-CM | POA: Diagnosis not present

## 2019-04-06 DIAGNOSIS — R591 Generalized enlarged lymph nodes: Secondary | ICD-10-CM

## 2019-04-06 DIAGNOSIS — Z806 Family history of leukemia: Secondary | ICD-10-CM

## 2019-04-06 DIAGNOSIS — M25511 Pain in right shoulder: Secondary | ICD-10-CM | POA: Diagnosis not present

## 2019-04-06 DIAGNOSIS — R221 Localized swelling, mass and lump, neck: Secondary | ICD-10-CM | POA: Diagnosis not present

## 2019-04-06 DIAGNOSIS — Z5112 Encounter for antineoplastic immunotherapy: Secondary | ICD-10-CM | POA: Diagnosis present

## 2019-04-06 DIAGNOSIS — Z801 Family history of malignant neoplasm of trachea, bronchus and lung: Secondary | ICD-10-CM | POA: Diagnosis not present

## 2019-04-06 DIAGNOSIS — C3490 Malignant neoplasm of unspecified part of unspecified bronchus or lung: Secondary | ICD-10-CM | POA: Diagnosis present

## 2019-04-06 DIAGNOSIS — Z5111 Encounter for antineoplastic chemotherapy: Secondary | ICD-10-CM | POA: Insufficient documentation

## 2019-04-06 DIAGNOSIS — Z452 Encounter for adjustment and management of vascular access device: Secondary | ICD-10-CM | POA: Diagnosis not present

## 2019-04-06 DIAGNOSIS — B370 Candidal stomatitis: Secondary | ICD-10-CM | POA: Diagnosis not present

## 2019-04-06 DIAGNOSIS — R0602 Shortness of breath: Secondary | ICD-10-CM | POA: Insufficient documentation

## 2019-04-06 DIAGNOSIS — R1310 Dysphagia, unspecified: Secondary | ICD-10-CM | POA: Insufficient documentation

## 2019-04-06 LAB — CBC WITH DIFFERENTIAL/PLATELET
Abs Immature Granulocytes: 0.03 10*3/uL (ref 0.00–0.07)
Basophils Absolute: 0 10*3/uL (ref 0.0–0.1)
Basophils Relative: 0 %
Eosinophils Absolute: 0.2 10*3/uL (ref 0.0–0.5)
Eosinophils Relative: 2 %
HCT: 36.8 % (ref 36.0–46.0)
Hemoglobin: 11.8 g/dL — ABNORMAL LOW (ref 12.0–15.0)
Immature Granulocytes: 0 %
Lymphocytes Relative: 5 %
Lymphs Abs: 0.4 10*3/uL — ABNORMAL LOW (ref 0.7–4.0)
MCH: 28.6 pg (ref 26.0–34.0)
MCHC: 32.1 g/dL (ref 30.0–36.0)
MCV: 89.1 fL (ref 80.0–100.0)
Monocytes Absolute: 0.7 10*3/uL (ref 0.1–1.0)
Monocytes Relative: 10 %
Neutro Abs: 6.3 10*3/uL (ref 1.7–7.7)
Neutrophils Relative %: 83 %
Platelets: 179 10*3/uL (ref 150–400)
RBC: 4.13 MIL/uL (ref 3.87–5.11)
RDW: 14.6 % (ref 11.5–15.5)
WBC: 7.6 10*3/uL (ref 4.0–10.5)
nRBC: 0 % (ref 0.0–0.2)

## 2019-04-06 LAB — COMPREHENSIVE METABOLIC PANEL
ALT: 31 U/L (ref 0–44)
AST: 18 U/L (ref 15–41)
Albumin: 3 g/dL — ABNORMAL LOW (ref 3.5–5.0)
Alkaline Phosphatase: 80 U/L (ref 38–126)
Anion gap: 17 — ABNORMAL HIGH (ref 5–15)
BUN: 15 mg/dL (ref 6–20)
CO2: 25 mmol/L (ref 22–32)
Calcium: 9.1 mg/dL (ref 8.9–10.3)
Chloride: 95 mmol/L — ABNORMAL LOW (ref 98–111)
Creatinine, Ser: 1.03 mg/dL — ABNORMAL HIGH (ref 0.44–1.00)
GFR calc Af Amer: >60 mL/min (ref >60)
GFR calc non Af Amer: >60 mL/min (ref >60)
Glucose, Bld: 115 mg/dL — ABNORMAL HIGH (ref 70–99)
Potassium: 3.5 mmol/L (ref 3.5–5.1)
Sodium: 137 mmol/L (ref 135–145)
Total Bilirubin: 0.8 mg/dL (ref 0.3–1.2)
Total Protein: 7.3 g/dL (ref 6.5–8.1)

## 2019-04-06 LAB — MAGNESIUM: Magnesium: 2.1 mg/dL (ref 1.7–2.4)

## 2019-04-06 MED ORDER — PROCHLORPERAZINE MALEATE 10 MG PO TABS: 10.0000 mg | ORAL_TABLET | Freq: Four times a day (QID) | ORAL | 1 refills | Status: DC | PRN

## 2019-04-06 MED ORDER — FLUCONAZOLE 100 MG PO TABS: 100.0000 mg | ORAL_TABLET | Freq: Every day | ORAL | 0 refills | Status: DC

## 2019-04-06 MED ORDER — FUROSEMIDE 20 MG PO TABS: 20.0000 mg | ORAL_TABLET | Freq: Once | ORAL | 3 refills | Status: DC | PRN

## 2019-04-06 NOTE — Patient Instructions (Addendum)
[place] at [facility] Discharge Instructions  You were seen today by Dr. Delton Coombes. He went over your recent lab and scan results. He will see you back as scheduled for labs and follow up.   Thank you for choosing [place] at [facility] to provide your oncology and hematology care.  To afford each patient quality time with our provider, please arrive at least 15 minutes before your scheduled appointment time.   If you have a lab appointment with the [facility] please come in thru the  Main Entrance and check in at the main information desk  You need to re-schedule your appointment should you arrive 10 or more minutes late.  We strive to give you quality time with our providers, and arriving late affects you and other patients whose appointments are after yours.  Also, if you no show three or more times for appointments you may be dismissed from the clinic at the providers discretion.     Again, thank you for choosing [facility].  Our hope is that these requests will decrease the amount of time that you wait before being seen by our physicians.       _____________________________________________________________  Should you have questions after your visit to [facility], please contact our office at [phone] between the hours of 8:00 a.m. and 4:30 p.m.  Voicemails left after 4:00 p.m. will not be returned until the following business day.  For prescription refill requests, have your pharmacy contact our office and allow 72 hours.    Cancer Center Support Programs:   > Cancer Support Group  2nd Tuesday of the month 1pm-2pm, Journey Room

## 2019-04-06 NOTE — Assessment & Plan Note (Signed)
1.  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  Patient is a current active smoker. - 03/06/2019: CT neck: Revealed a 2.4 cm retropharyngeal fluid collection at C3 level and mild mass effect on the posterior oropharynx. No peritonsilar abscess was noted. She had cervical adenopathy including a level IIa node measuring 1.6 cm on the right and 1.4 cm on the left. There was a right sided 6.1 x 4.8 cm nodal conglomerate at level IV. There was also partially visualized adenopathy measuring 6.4 x 4.1 cm in the right paratracheal region.  - 03/14/2019: Biopsy of her right cervical adenopathy. Pathology suggest non-small cell cancer, squamous cell. Cells are positive for cytokeratin 7, cytokeratin 5/6, MOC-31 and p63 but negative for cytokeratin 20, mucicarmine, S100, Pax-8, and GATA-3. P16 negative -Completed palliative XRT on 03/31/2019. - PET CT scan on 04/04/2019 shows markedly hypermetabolic nodal disease in the right supraclavicular region, right thoracic inlet/high right paratracheal station.  Lymphadenopathy in the right neck and both axilla show low-level FDG accumulation.  Markedly hypermetabolic bilateral adrenal nodules concerning for metastasis. -Even though there is no clear lung primary, this is mostly consistent with metastatic lung cancer. -She is scheduled for MRI of the brain tomorrow. -I have discussed palliative chemotherapy with carboplatin, paclitaxel and pembrolizumab every 21 days for 4-6 cycles depending on response followed by maintenance pembrolizumab.  We talked about side effects in detail.  Treatment intent in the palliative setting was also discussed. -She had PICC line placed by IR.  We will plan to start her chemotherapy on Friday. -She does have oral thrush.  We will call in Diflucan for 5 days.  2.  Neck swelling: -She is taking Lasix 20 to 40 mg daily.  We have given a refill. -She is still not able to lie  flat.  3.  Right shoulder pain: -She takes oxycodone 5 mg as needed, mostly at bedtime.

## 2019-04-06 NOTE — Patient Instructions (Signed)
[facility] Chemotherapy Teaching    You have been diagnosed with Stage IV (metastatic) non-small cell lung cancer.  You will be treated every 21 days with paclitaxel (Taxol), carboplatin, and pembrolizumab (Keytruda).  You will receive all three medications x 4 cycles, then remain on Keytruda ONLY every 3 weeks for maintenance therapy.  The intent of this treatment is palliative - which means it is meant to help control your cancer and alleviate any symptoms you may be having related to your cancer.  You will see the doctor regularly throughout treatment.  We monitor your lab work prior to every treatment. The doctor monitors your response to treatment by the way you are feeling, your blood work, and scans periodically.  There will be wait times while you are here for treatment.  It will take about 30 minutes to 1 hour for your lab work to result.  Then there will be wait times while pharmacy mixes your medications.   Medications you will receive in the clinic prior to your chemotherapy medications:  Aloxi:  ALOXI is used in adults to help prevent the nausea and vomiting that happens with certain chemotherapies.  Aloxi is a long acting medication, and will remain in your system for 24-36 hours.   Emend:  This is an anti-nausea medication that is used with Aloxi to help prevent nausea and vomiting caused by chemotherapy.  Dexamethasone:  This is a steroid given prior to chemotherapy to help prevent allergic reactions; it may also help prevent and control nausea and diarrhea.   Pepcid:  This medication is a histamine blocker that helps prevent and allergic reaction to your chemotherapy.   Benadryl:  This is a histamine blocker (different from the Pepcid) that helps prevent allergic/infusion reactions to your chemotherapy. This medication may cause dizziness/drowsiness.   Neulasta - this medication is not chemo but being given because you have had chemo. This medication works by  boosting your bone marrow's supply of white blood cells. White blood cells are what protect our bodies against infection. The medication is given in the form of a subcutaneous injection. It is given in the fatty tissue of the back of your arm.  The major side effect of this medication is bone or muscle pain. The drug of choice to relieve or lessen the pain is Aleve or Ibuprofen. If a physician has ever told you not to take Aleve or Ibuprofen - then don't take it. You should then take Tylenol/acetaminophen. Take either medication as the bottle directs you to.  The level of pain you experience as a result of this injection can range from none, to mild or moderate, or severe. Please let us know if you develop moderate or severe bone pain.   You can take Claritin 10 mg over the counter for a few days after receiving neulasta to help with the bone aches and pains.  Carboplatin (Paraplatin, CBDCA)  About This Drug Carboplatin is used to treat cancer. It is given in the vein (IV).  Possible Side Effects . Bone marrow suppression. This is a decrease in the number of white blood cells, red blood cells, and platelets. This may raise your risk of infection, make you tired and weak (fatigue), and raise your risk of bleeding. . Nausea and vomiting (throwing up) . Weakness . Changes in your liver function . Changes in your kidney function . Electrolyte changes . Pain  Note: Each of the side effects above was reported in 20% or greater of patients  treated with carboplatin. Not all possible side effects are included above.  Warnings and Precautions . Severe bone marrow suppression . Allergic reactions, including anaphylaxis are rare but may happen in some patients. Signs of allergic reaction to this drug may be swelling of the face, feeling like your tongue or throat are swelling, trouble breathing, rash, itching, fever, chills, feeling dizzy, and/or feeling that your heart is beating in a fast or not  normal way. If this happens, do not take another dose of this drug. You should get urgent medical treatment. . Severe nausea and vomiting . Effects on the nerves are called peripheral neuropathy. This risk is increased if you are over the age of 63 or if you have received other medicine with risk of peripheral neuropathy. You may feel numbness, tingling, or pain in your hands and feet. It may be hard for you to button your clothes, open jars, or walk as usual. The effect on the nerves may get worse with more doses of the drug. These effects get better in some people after the drug is stopped but it does not get better in all people. [name] Blurred vision, loss of vision or other changes in eyesight . Decreased hearing . Skin and tissue irritation including redness, pain, warmth, or swelling at the IV site if the drug leaks out of the vein and into nearby tissue. . Severe changes in your kidney function, which can cause kidney failure . Severe changes in your liver function, which can cause liver failure  Note: Some of the side effects above are very rare. If you have concerns and/or questions, please discuss them with your medical team.  Important Information . This drug may be present in the saliva, tears, sweat, urine, stool, vomit, semen, and vaginal secretions. Talk to your doctor and/or your nurse about the necessary precautions to take during this time.  Treating Side Effects . Manage tiredness by pacing your activities for the day. . Be sure to include periods of rest between energy-draining activities. . To decrease the risk of infection, wash your hands regularly. . Avoid close contact with people who have a cold, the flu, or other infections. . Take your temperature as your doctor or nurse tells you, and whenever you feel like you may have a fever. . To help decrease the risk of bleeding, use a soft toothbrush. Check with your nurse before using dental floss. . Be very careful  when using knives or tools. . Use an electric shaver instead of a razor. . Drink plenty of fluids (a minimum of eight glasses per day is recommended). . If you throw up or have loose bowel movements, you should drink more fluids so that you do not become dehydrated (lack of water in the body from losing too much fluid). . To help with nausea and vomiting, eat small, frequent meals instead of three large meals a day. Choose foods and drinks that are at room temperature. Ask your nurse or doctor about other helpful tips and medicine that is available to help stop or lessen these symptoms. . If you have numbness and tingling in your hands and feet, be careful when cooking, walking, and handling sharp objects and hot liquids. [name] Keeping your pain under control is important to your well-being. Please tell your doctor or nurse if you are experiencing pain.  Food and Drug Interactions . There are no known interactions of carboplatin with food. . This drug may interact with other medicines. Tell your doctor and  pharmacist about all the prescription and over-the-counter medicines and dietary supplements (vitamins, minerals, herbs and others) that you are taking at this time. Also, check with your doctor or pharmacist before starting any new prescription or over-the-counter medicines, or dietary supplements to make sure that there are no interactions.  When to Call the Doctor Call your doctor or nurse if you have any of these symptoms and/or any new or unusual symptoms: . Fever of 100.4 F (38 C) or higher . Chills . Tiredness that interferes with your daily activities . Feeling dizzy or lightheaded . Easy bleeding or bruising . Nausea that stops you from eating or drinking and/or is not relieved by prescribed medicines . Throwing up more than 3 times a day . Blurred vision or other changes in eyesight . Decrease in hearing or ringing in the ear . Signs of allergic reaction: swelling of the  face, feeling like your tongue or throat are swelling, trouble breathing, rash, itching, fever, chills, feeling dizzy, and/or feeling that your heart is beating in a fast or not normal way. If this happens, call 911 for emergency care. . While you are getting this drug, please tell your nurse right away if you have any pain, redness, or swelling at the site of the IV infusion . Signs of possible liver problems: dark urine, pale bowel movements, bad stomach pain, feeling very tired and weak, unusual itching, or yellowing of the eyes or skin . Decreased urine, or very dark urine . Numbness, tingling, or pain in your hands and feet . Pain that does not go away or is not relieved by prescribed medicine . If you think you may be pregnant  Reproduction Warnings . Pregnancy warning: This drug may have harmful effects on the unborn baby. Women of child bearing potential should use effective methods of birth control during your cancer treatment. Let your doctor know right away if you think you may be pregnant. . Breastfeeding warning: It is not known if this drug passes into breast milk. For this reason, women should not breastfeed during treatment because this drug could enter the breast milk and cause harm to a breastfeeding baby. . Fertility warning: Human fertility studies have not been done with this drug. Talk with your doctor or nurse if you plan to have children. Ask for information on sperm or egg banking.  Paclitaxel (Taxol)  Paclitaxel is a drug used to treat cancer. It is given in the vein (IV).  This will take 1 hour to infuse.  The first time this drug is given it will take longer to infuse because it is increased slowly to monitor for reactions.  The nurse will be in the room with you for the first 15 minutes.   Possible Side Effects . Hair loss. Hair loss is often temporary, although with certain medicine, hair loss can sometimes be permanent. Hair loss may happen suddenly or  gradually. If you lose hair, you may lose it from your head, face, armpits, pubic area, chest, and/or legs. You may also notice your hair getting thin. . Swelling of your legs, ankles and/or feet (edema) . Flushing . Nausea and throwing up (vomiting) . Loose bowel movements (diarrhea) . Bone marrow depression. This is a decrease in the number of white blood cells, red blood cells, and platelets. This may raise your risk of infection, make you tired and weak (fatigue), and raise your risk of bleeding. . Effects on the nerves are called peripheral neuropathy. You may feel numbness, tingling,  or pain in your hands and feet. It may be hard for you to button your clothes, open jars, or walk as usual. The effect on the nerves may get worse with more doses of the drug. These effects get better in some people after the drug is stopped but it does not get better in all people. . Changes in your liver function . Bone, joint and muscle pain . Abnormal EKG . Allergic reaction: Allergic reactions, including anaphylaxis are rare but may happen in some patients. Signs of allergic reaction to this drug may be swelling of the face, feeling like your tongue or throat are swelling, trouble breathing, rash, itching, fever, chills, feeling dizzy, and/or feeling that your heart is beating in a fast or not normal way. If this happens, do not take another dose of this drug. You should get urgent medical treatment. . Infection . Changes in your kidney function.  Note: Each of the side effects above was reported in 20% or greater of patients treated with paclitaxel. Not all possible side effects are included above.  Warnings and Precautions . Severe bone marrow depression  Side Effects . To help with hair loss, wash with a mild shampoo and avoid washing your hair every day. . Avoid rubbing your scalp, instead, pat your hair or scalp dry . Avoid coloring your hair . Limit your use of hair spray, electric curlers, blow  dryers, and curling irons. . If you are interested in getting a wig, talk to your nurse. You can also call the [facility] at 800-ACS-2345 to find out information about the "Look Good, Feel Better" program close to where you live. It is a free program where women getting chemotherapy can learn about wigs, turbans and scarves as well as makeup techniques and skin and nail care. . Ask your doctor or nurse about medicines that are available to help stop or lessen diarrhea and/or nausea. . To help with nausea and vomiting, eat small, frequent meals instead of three large meals a day. Choose foods and drinks that are at room temperature. Ask your nurse or doctor about other helpful tips and medicine that is available to help or stop lessen these symptoms. . If you get diarrhea, eat low-fiber foods that are high in protein and calories and avoid foods that can irritate your digestive tracts or lead to cramping. Ask your nurse or doctor about medicine that can lessen or stop your diarrhea. . Mouth care is very important. Your mouth care should consist of routine, gentle cleaning of your teeth or dentures and rinsing your mouth with a mixture of 1/2 teaspoon of salt in 8 ounces of water or  teaspoon of baking soda in 8 ounces of water. This should be done at least after each meal and at bedtime. . If you have mouth sores, avoid mouthwash that has alcohol. Also avoid alcohol and smoking because they can bother your mouth and throat. . Drink plenty of fluids (a minimum of eight glasses per day is recommended). . Take your temperature as your doctor or nurse tells you, and whenever you feel like you may have a fever. . Talk to your doctor or nurse about precautions you can take to avoid infections and bleeding. . Be careful when cooking, walking, and handling sharp objects and hot liquids.  Food and Drug Interactions . There are no known interactions of paclitaxel with food. . This drug may interact  with other medicines. Tell your doctor and pharmacist about all the  medicines and dietary supplements (vitamins, minerals, herbs and others) that you are taking at this time. . The safety and use of dietary supplements and alternative diets are often not known. Using these might affect your cancer or interfere with your treatment. Until more is known, you should not use dietary supplements or alternative diets without your cancer doctor's help.  When to Call the Doctor Call your doctor or nurse if you have any of the following symptoms and/or any new or unusual symptoms: . Fever of 100.5 F (38 C) or above . Chills . Redness, pain, warmth, or swelling at the IV site during the infusion . Signs of allergic reaction: swelling of the face, feeling like your tongue or throat are swelling, trouble breathing, rash, itching, fever, chills, feeling dizzy, and/or feeling that your heart is beating in a fast or not normal way . Feeling that your heart is beating in a fast or not normal way (palpitations) . Weight gain of 5 pounds in one week (fluid retention) . Decreased urine or very dark urine . Signs of liver problems: dark urine, pale bowel movements, bad stomach pain, feeling very tired and weak, unusual itching, or yellowing of the eyes or skin . Heavy menstrual period that lasts longer than normal . Easy bruising or bleeding . Nausea that stops you from eating or drinking, and/or that is not relieved by prescribed medicines. . Loose bowel movements (diarrhea) more than 4 times a day or diarrhea with weakness or lightheadedness . Pain in your mouth or throat that makes it hard to eat or drink . Lasting loss of appetite or rapid weight loss of five pounds in a week . Signs of peripheral neuropathy: numbness, tingling, or decreased feeling in fingers or toes; trouble walking or changes in the way you walk; or feeling clumsy when buttoning clothes, opening jars, or other routine activities . Joint and  muscle pain that is not relieved by prescribed medicines . Extreme fatigue that interferes with normal activities . While you are getting this drug, please tell your nurse right away if you have any pain, redness, or swelling at the site of the IV infusion. . If you think you are pregnant.  Reproduction Warnings . Pregnancy warning: This drug may have harmful effects on the unborn child, it is recommended that effective methods of birth control should be used during your cancer treatment. Let your doctor know right away if you think you may be pregnant. . Breast feeding warning: Women should not breast feed during treatment because this drug could enter the breast milk and cause harm to a breast feeding baby.  Pembrolizumab Beryle Flock)  About This Drug Pembrolizumab is used to treat cancer. It is given in the vein (IV).  Possible Side Effects . Nausea . Diarrhea (loose bowel movements) . Constipation (not able to move bowels) . Pain in your abdomen . Tiredness . Fever . Decreased appetite (decreased hunger) . Muscle and bone pain . Trouble breathing . Cough . Rash . Itching  Note: Each of the side effects above was reported in 20% or greater of patients treated with pembrolizumab. Your side effects may be different if you are receiving pembrolizumab in combination with another chemotherapy agent. Not all possible side effects are included above.  Warnings and Precautions . This drug works with your immune system and can cause inflammation in any of your organs and tissues and can change how they work. This may put you at risk for developing serious medical problems, which  can be life-threatening. . Inflammation in the colon (colitis), which can be life-threatening - symptoms are loose bowel movements (diarrhea) stomach cramping, and sometimes blood in the bowel movements . Changes in liver function . Changes in kidney function . Inflammation (swelling) of the lungs, which can  be life-threatening. You may have a dry cough or trouble breathing. . This drug may affect some of your hormone glands (especially the thyroid, adrenals, pituitary and pancreas). . Blood sugar levels may change, and you may develop diabetes. If you already have diabetes, changes may need to be made to your diabetes medication. . Severe allergic skin reaction, which can be life-threatening. You may develop blisters on your skin that are filled with fluid or a severe red rash all over your body that may be painful. . Increased risk of organ rejection in patients who have received donor organs . Increased risk of complications in patients who will undergo a stem cell transplant after receiving pembrolizumab. . While you are getting this drug in your vein (IV), you may have a reaction to the drug. Your nurse will check you closely for these signs: fever or shaking chills, flushing, facial swelling, feeling dizzy, headache, trouble breathing, rash, itching, chest tightness, or chest pain. These reactions may occur after your infusion. If this happens, call 911 for emergency care.  Note: Some of the side effects above are very rare. If you have concerns and/or questions, please discuss them with your medical team.  Important Information . This drug may be present in the saliva, tears, sweat, urine, stool, vomit, semen, and vaginal secretions. Talk to your doctor and/or your nurse about the necessary precautions to take during this time.  Treating Side Effects . Drink plenty of fluids (a minimum of eight glasses per day is recommended). . If you throw up or have loose bowel movements, you should drink more fluids so that you do not become dehydrated (lack of water in the body from losing too much fluid). . To help with nausea and vomiting, eat small, frequent meals instead of three large meals a day. Choose foods and drinks that are at room temperature. Ask your nurse or doctor about other  helpful tips and medicine that is available to help stop or lessen these symptoms. . If you have diarrhea, eat low-fiber foods that are high in protein and calories and avoid foods that can irritate your digestive tracts or lead to cramping. . If you are not able to move your bowels, check with your doctor or nurse before you use any enemas, laxatives, or suppositories. . Ask your doctor or nurse about medicines that are available to help stop or lessen constipation or diarrhea. . To help with decreased appetite, eat small, frequent meals. Eat foods high in calories and protein, such as meat, poultry, fish, dry beans, tofu, eggs, nuts, milk, yogurt, cheese, ice cream, pudding, and nutritional supplements. . Consider using sauces and spices to increase taste. Daily exercise, with your doctor's approval, may increase your appetite. . Manage tiredness by pacing your activities for the day. Be sure to include periods of rest between energy-draining activities. [name] Keeping your pain under control is important to your wellbeing. Please tell your doctor or nurse if you are experiencing pain. . If you have diabetes, keep good control of your blood sugar level. Tell your nurse or your doctor if your glucose levels are higher or lower than normal. . If you get a rash do not put anything on it unless your  doctor or nurse says you may. Keep the area around the rash clean and dry. Ask your doctor for medicine if your rash bothers you. . Moisturize your skin several times a day . Avoid sun exposure and apply sunscreen routinely when outdoors . Infusion reactions may happen after your infusion. If this happens, call 911 for emergency care.  Food and Drug Interactions . There are no known interactions of pembrolizumab with food. . This drug may interact with other medicines. Tell your doctor and pharmacist about all the prescription and over-the-counter medicines and dietary supplements (vitamins,  minerals, herbs and others) that you are taking at this time. Also, check with your doctor or pharmacist before starting any new prescription or over-the-counter medicines, or dietary supplements to make sure that there are no interactions.  When to Call the Doctor Call your doctor or nurse if you have any of the following symptoms and/or any new or unusual symptoms: . Fever of 100.4 F (38 C) or higher . Chills . Headache that does not go away . Tiredness that interferes with your daily activities . Feeling dizzy or lightheaded . Wheezing or trouble breathing . Chest pain . Dry cough . Coughing up yellow, green or bloody mucus . Nausea that stops you from eating or drinking, and/or that is not relieved by prescribed medicines . Lasting loss of appetite or rapid weight loss of five pounds in a week . Diarrhea, 4 times in one day or diarrhea with lack of strength or a feeling of being dizzy . Blood in your stool . No bowel movement for 3 days or when you feel uncomfortable . Extreme weakness that interferes with normal activities . Decreased urine . Abnormal blood sugar . Unusual thirst, passing urine often, headache, sweating, shakiness, irritability . A new rash and/or itching . Rash that is not relieved by prescribed medicines . Pain that does not go away or is not relieved by prescribed medicines, especially in the upper right abdomen . Flu-like symptoms: fever, headache, muscle and joint aches, and fatigue (low energy, feeling weak) . Signs of liver problems: dark urine, pale bowel movements, bad stomach pain, feeling very tired and weak, unusual itching, or yellowing of the eyes or skin . Signs of infusion reactions such as fever or shaking chills, flushing, facial swelling, feeling dizzy, headache, trouble breathing, rash, itching, chest tightness, or chest pain. If this happens, call 911 for emergency care. . If you think you are pregnant  Reproduction Warnings .  Pregnancy warning: This drug may have harmful effects on the unborn baby. Women of childbearing potential should use effective methods of birth control during your cancer treatment and for at least 4 months after treatment. Let your doctor know right away if you think you may be pregnant. . Breast feeding warning: It is not known if this drug passes into breast milk. For this reason, women should not breastfeed during treatment and for 4 months after treatment because this drug could enter the breast milk and cause harm to a breastfeeding baby. . Fertility warning: Human fertility studies have not been done with this drug. Talk with your doctor or nurse if you plan to have children.  SELF CARE ACTIVITIES WHILE ON CHEMOTHERAPY:  Hydration Increase your fluid intake 48 hours prior to treatment and drink at least 8 to 12 cups (64 ounces) of water/decaffeinated beverages per day after treatment. You can still have your cup of coffee or soda but these beverages do not count as part of your  8 to 12 cups that you need to drink daily. No alcohol intake.  Medications Continue taking your normal prescription medication as prescribed.  If you start any new herbal or new supplements please let us know first to make sure it is safe.  Mouth Care Have teeth cleaned professionally before starting treatment. Keep dentures and partial plates clean. Use soft toothbrush and do not use mouthwashes that contain alcohol. Biotene is a good mouthwash that is available at most pharmacies or may be ordered by calling [phone]. Use warm salt water gargles (1 teaspoon salt per 1 quart warm water) before and after meals and at bedtime. Or you may rinse with 2 tablespoons of three-percent hydrogen peroxide mixed in eight ounces of water. If you are still having problems with your mouth or sores in your mouth please call the clinic. If you need dental work, please let the doctor know before you go for your appointment so  that we can coordinate the best possible time for you in regards to your chemo regimen. You need to also let your dentist know that you are actively taking chemo. We may need to do labs prior to your dental appointment.  Skin Care Always use sunscreen that has not expired and with SPF (Sun Protection Factor) of 50 or higher. Wear hats to protect your head from the sun. Remember to use sunscreen on your hands, ears, face, & feet.  Use good moisturizing lotions such as udder cream, eucerin, or even Vaseline. Some chemotherapies can cause dry skin, color changes in your skin and nails.    . Avoid long, hot showers or baths. . Use gentle, fragrance-free soaps and laundry detergent. . Use moisturizers, preferably creams or ointments rather than lotions because the thicker consistency is better at preventing skin dehydration. Apply the cream or ointment within 15 minutes of showering. Reapply moisturizer at night, and moisturize your hands every time after you wash them.  Hair Loss (if your doctor says your hair will fall out)  . If your doctor says that your hair is likely to fall out, decide before you begin chemo whether you want to wear a wig. You may want to shop before treatment to match your hair color. . Hats, turbans, and scarves can also camouflage hair loss, although some people prefer to leave their heads uncovered. If you go bare-headed outdoors, be sure to use sunscreen on your scalp. . Cut your hair short. It eases the inconvenience of shedding lots of hair, but it also can reduce the emotional impact of watching your hair fall out. . Don't perm or color your hair during chemotherapy. Those chemical treatments are already damaging to hair and can enhance hair loss. Once your chemo treatments are done and your hair has grown back, it's OK to resume dyeing or perming hair.  With chemotherapy, hair loss is almost always temporary. But when it grows back, it may be a different color or texture.  In older adults who still had hair color before chemotherapy, the new growth may be completely Gammage.  Often, new hair is very fine and soft.  Infection Prevention Please wash your hands for at least 30 seconds using warm soapy water. Handwashing is the #1 way to prevent the spread of germs. Stay away from sick people or people who are getting over a cold. If you develop respiratory systems such as green/yellow mucus production or productive cough or persistent cough let us know and we will see if you need an  antibiotic. It is a good idea to keep a pair of gloves on when going into grocery stores/[employer] to decrease your risk of coming into contact with germs on the carts, etc. Carry alcohol hand gel with you at all times and use it frequently if out in public. If your temperature reaches 100.5 or higher please call the clinic and let us know.  If it is after hours or on the weekend please go to the ER if your temperature is over 100.5.  Please have your own personal thermometer at home to use.    Sex and bodily fluids If you are going to have sex, a condom must be used to protect the person that isn't taking chemotherapy. Chemo can decrease your libido (sex drive). For a few days after chemotherapy, chemotherapy can be excreted through your bodily fluids.  When using the toilet please close the lid and flush the toilet twice.  Do this for a few day after you have had chemotherapy.   Effects of chemotherapy on your sex life Some changes are simple and won't last long. They won't affect your sex life permanently.  Sometimes you may feel: . too tired . not strong enough to be very active . sick or sore  . not in the mood . anxious or low Your anxiety might not seem related to sex. For example, you may be worried about the cancer and how your treatment is going. Or you may be worried about money, or about how you family are coping with your illness. These things can cause stress, which can affect your  interest in sex. It's important to talk to your partner about how you feel. Remember - the changes to your sex life don't usually last long. There's usually no medical reason to stop having sex during chemo. The drugs won't have any long term physical effects on your performance or enjoyment of sex. Cancer can't be passed on to your partner during sex  Contraception It's important to use reliable contraception during treatment. Avoid getting pregnant while you or your partner are having chemotherapy. This is because the drugs may harm the baby. Sometimes chemotherapy drugs can leave a man or woman infertile.  This means you would not be able to have children in the future. You might want to talk to someone about permanent infertility. It can be very difficult to learn that you may no longer be able to have children. Some people find counselling helpful. There might be ways to preserve your fertility, although this is easier for men than for women. You may want to speak to a fertility expert. You can talk about sperm banking or harvesting your eggs. You can also ask about other fertility options, such as donor eggs. If you have or have had breast cancer, your doctor might advise you not to take the contraceptive pill. This is because the hormones in it might affect the cancer.  It is not known for sure whether or not chemotherapy drugs can be passed on through semen or secretions from the vagina. Because of this some doctors advise people to use a barrier method if you have sex during treatment. This applies to vaginal, anal or oral sex. Generally, doctors advise a barrier method only for the time you are actually having the treatment and for about a week after your treatment. Advice like this can be worrying, but this does not mean that you have to avoid being intimate with your partner. You can still have close  contact with your partner and continue to enjoy sex.  Animals If you have cats or birds we  just ask that you not change the litter or change the cage.  Please have someone else do this for you while you are on chemotherapy.   Food Safety During and After Cancer Treatment Food safety is important for people both during and after cancer treatment. Cancer and cancer treatments, such as chemotherapy, radiation therapy, and stem cell/bone marrow transplantation, often weaken the immune system. This makes it harder for your body to protect itself from foodborne illness, also called food poisoning. Foodborne illness is caused by eating food that contains harmful bacteria, parasites, or viruses.  Foods to avoid Some foods have a higher risk of becoming tainted with bacteria. These include: [name] Unwashed fresh fruit and vegetables, especially leafy vegetables that can hide dirt and other contaminants . Raw sprouts, such as alfalfa sprouts . Raw or undercooked beef, especially ground beef, or other raw or undercooked meat and poultry . Fatty, fried, or spicy foods immediately before or after treatment.  These can sit heavy on your stomach and make you feel nauseous. . Raw or undercooked shellfish, such as oysters. . Sushi and sashimi, which often contain raw fish.  . Unpasteurized beverages, such as unpasteurized fruit juices, raw milk, raw yogurt, or cider . Undercooked eggs, such as soft boiled, over easy, and poached; raw, unpasteurized eggs; or foods made with raw egg, such as homemade raw cookie dough and homemade mayonnaise  Simple steps for food safety  Shop smart. . Do not buy food stored or displayed in an unclean area. . Do not buy bruised or damaged fruits or vegetables. . Do not buy cans that have cracks, dents, or bulges. . Pick up foods that can spoil at the end of your shopping trip and store them in a cooler on the way home.  Prepare and clean up foods carefully. . Rinse all fresh fruits and vegetables under running water, and dry them with a clean towel or paper  towel. . Clean the top of cans before opening them. . After preparing food, wash your hands for 20 seconds with hot water and soap. Pay special attention to areas between fingers and under nails. . Clean your utensils and dishes with hot water and soap. [name] Disinfect your kitchen and cutting boards using 1 teaspoon of liquid, unscented bleach mixed into 1 quart of water.    Dispose of old food. . Eat canned and packaged food before its expiration date (the "use by" or "best before" date). . Consume refrigerated leftovers within 3 to 4 days. After that time, throw out the food. Even if the food does not smell or look spoiled, it still may be unsafe. Some bacteria, such as Listeria, can grow even on foods stored in the refrigerator if they are kept for too long.  Take precautions when eating out. . At restaurants, avoid buffets and salad bars where food sits out for a long time and comes in contact with many people. Food can become contaminated when someone with a virus, often a norovirus, or another "bug" handles it. . Put any leftover food in a "to-go" container yourself, rather than having the server do it. And, refrigerate leftovers as soon as you get home. . Choose restaurants that are clean and that are willing to prepare your food as you order it cooked.   MEDICATIONS:  Compazine/Prochlorperazine 10mg  tablet. Take 1 tablet every 6 hours as needed for nausea/vomiting. (This can make you sleepy)   EMLA cream. Apply a quarter size amount to port site 1 hour prior to chemo. Do not rub in. Cover with plastic wrap.   Over-the-Counter Meds:  Colace - 100 mg capsules - take 2 capsules daily.  If this doesn't help then you can increase to 2 capsules twice daily.  Call us if this does not help your bowels move.   Imodium 2mg  capsule.  Take 2 capsules after the 1st loose stool and then 1 capsule every 2 hours until you go a total of 12 hours without having a loose stool. Call the [facility] if loose stools continue. If diarrhea occurs at bedtime, take 2 capsules at bedtime. Then take 2 capsules every 4 hours until morning. Call [facility].    Diarrhea Sheet   If you are having loose stools/diarrhea, please purchase Imodium and begin taking as outlined:  At the first sign of poorly formed or loose stools you should begin taking Imodium (loperamide) 2 mg capsules.  Take two tablets (4mg ) followed by one tablet (2mg ) every 2 hours - DO NOT EXCEED 8 tablets in 24 hours.  If it is bedtime and you are having loose stools, take 2 tablets at bedtime, then 2 tablets every 4 hours until morning.   Always call the [facility] if you are having loose stools/diarrhea that you can't get under control.  Loose stools/diarrhea leads to dehydration (loss of water) in your body.  We have other options of trying to get the loose stools/diarrhea to stop but you must let us know!   Constipation Sheet  Colace - 100 mg capsules - take 2 capsules daily.  If this doesn't help then you can increase to 2 capsules twice daily.  Please call if the above does not work for you.   Do not go more than 2 days without a bowel movement.  It is very important that you do not become constipated.  It will make you feel sick to your stomach (nausea) and can cause abdominal pain and vomiting.   Nausea Sheet   Compazine/Prochlorperazine 10mg  tablet. Take 1 tablet every 6 hours as needed for nausea/vomiting. (This can make you sleepy)  If you are having persistent nausea (nausea that does not stop) please call the [facility] and let us know the amount of nausea that you are experiencing.  If you begin to vomit, you need to call the [facility] and if it is the weekend and you have vomited more than one time and can't get it to stop-go to the Emergency  Room.  Persistent nausea/vomiting can lead to dehydration (loss of fluid in your body) and will make you feel terrible.   Ice chips, sips of clear liquids, foods that are @ room temperature, crackers, and toast tend to be better tolerated.   SYMPTOMS TO REPORT AS SOON AS POSSIBLE AFTER TREATMENT:   FEVER GREATER THAN 100.5 F  CHILLS WITH OR WITHOUT FEVER  NAUSEA AND VOMITING THAT IS NOT CONTROLLED WITH YOUR NAUSEA MEDICATION  UNUSUAL SHORTNESS OF BREATH  UNUSUAL BRUISING OR BLEEDING  TENDERNESS IN MOUTH AND THROAT WITH OR WITHOUT PRESENCE OF ULCERS  URINARY PROBLEMS  BOWEL PROBLEMS  UNUSUAL RASH      Wear comfortable clothing and clothing appropriate for easy access to any Portacath or PICC line. Let us know if there is anything that we can do to make your  therapy better!    What to do if you need assistance after hours or on the weekends: CALL [phone].  HOLD on the line, do not hang up.  You will hear multiple messages but at the end you will be connected with a nurse triage line.  They will contact the doctor if necessary.  Most of the time they will be able to assist you.  Do not call the hospital operator.      I have been informed and understand all of the instructions given to me and have received a copy. I have been instructed to call the clinic [phone] or my family physician as soon as possible for continued medical care, if indicated. I do not have any more questions at this time but understand that I may call the [facility] or the Patient Navigator at [phone] during office hours should I have questions or need assistance in obtaining follow-up care.

## 2019-04-06 NOTE — Progress Notes (Signed)
[place], [place] [address]   CLINIC:  Medical Oncology/Hematology  PCP:  Glenda Chroman, MD [place] [address] [phone]   REASON FOR VISIT:  Follow-up for metastatic squamous cell carcinoma.   BRIEF ONCOLOGIC HISTORY:    Squamous cell lung cancer, unspecified laterality ([place])   04/05/2019 Initial Diagnosis    Squamous cell lung cancer, unspecified laterality ([place])    04/08/2019 -  Chemotherapy    The patient had palonosetron (ALOXI) injection 0.25 mg, 0.25 mg, Intravenous,  Once, 0 of 4 cycles pegfilgrastim (NEULASTA ONPRO KIT) injection 6 mg, 6 mg, Subcutaneous, Once, 0 of 4 cycles CARBOplatin (PARAPLATIN) in sodium chloride 0.9 % 100 mL chemo infusion, , Intravenous,  Once, 0 of 4 cycles PACLitaxel (TAXOL) 492 mg in sodium chloride 0.9 % 500 mL chemo infusion (> '80mg'$ /m2), 200 mg/m2, Intravenous,  Once, 0 of 4 cycles pembrolizumab (KEYTRUDA) 200 mg in sodium chloride 0.9 % 50 mL chemo infusion, 200 mg, Intravenous, Once, 0 of 5 cycles fosaprepitant (EMEND) 150 mg, dexamethasone (DECADRON) 12 mg in sodium chloride 0.9 % 145 mL IVPB, , Intravenous,  Once, 0 of 4 cycles  for chemotherapy treatment.       CANCER STAGING: Cancer Staging No matching staging information was found for the patient.   INTERVAL HISTORY:  [name] 38 y.o. female seen for follow-up of metastatic squamous cell carcinoma.  Continues to have neck swelling.  Finished her radiation therapy on 03/31/2019.  Appetite is 25% and energy levels are low.  She is taking Lasix, took her last pill yesterday.  We have called in a steroid Dosepak for her neck swelling over the weekend which did not help much.  She has trouble swallowing and pain at times.  She does report occasional right shoulder pain, particularly when lying down.  She takes oxycodone 5 mg tablet as needed.  She did complete her PET CT scan.  MRI is scheduled for tomorrow.  She did have a PICC line  placed.    REVIEW OF SYSTEMS:  Review of Systems  HENT:   Positive for trouble swallowing.   Respiratory: Positive for shortness of breath.   All other systems reviewed and are negative.    PAST MEDICAL/SURGICAL HISTORY:  Past Medical History:  Diagnosis Date  . Depression   . Glucose intolerance 03/11/2019  . Obesity, Class III, BMI 40-49.9 (morbid obesity) ([place]) 03/11/2019   Past Surgical History:  Procedure Laterality Date  . CESAREAN SECTION    . IRRIGATION AND DEBRIDEMENT ABSCESS  03/15/2019   RETROPHARYNGEAL   . TONSILLECTOMY AND ADENOIDECTOMY N/A 03/15/2019   Procedure: IRRIGATION AND DEBRIDEMENT OF RETROPHARYNGEAL ABSCESS;  Surgeon: Leta Baptist, MD;  Location: [facility];  Service: ENT;  Laterality: N/A;     SOCIAL HISTORY:  Social History   Socioeconomic History  . Marital status: Widowed    Spouse name: Not on file  . Number of children: 1  . Years of education: Not on file  . Highest education level: Not on file  Occupational History  . Not on file  Social Needs  . Financial resource strain: Not very hard  . Food insecurity:    Worry: Never true    Inability: Never true  . Transportation needs:    Medical: No    Non-medical: No  Tobacco Use  . Smoking status: Current Every Day Smoker    Packs/day: 1.00    Years: 25.00    Pack years:  25.00    Types: Cigarettes  . Smokeless tobacco: Never Used  Substance and Sexual Activity  . Alcohol use: Not Currently    Comment: social   . Drug use: Not Currently  . Sexual activity: Not on file  Lifestyle  . Physical activity:    Days per week: 0 days    Minutes per session: 0 min  . Stress: Only a little  Relationships  . Social connections:    Talks on phone: More than three times a week    Gets together: Twice a week    Attends religious service: Never    Active member of club or organization: No    Attends meetings of clubs or organizations: Never    Relationship status: Widowed  . Intimate partner  violence:    Fear of current or ex partner: No    Emotionally abused: No    Physically abused: No    Forced sexual activity: No  Other Topics Concern  . Not on file  Social History Narrative   2019-03-28   Husband passed away four months ago from Cancer of the [place].   Pt. Has 12 yo son at home.    FAMILY HISTORY:  Family History  Problem Relation Age of Onset  . Lung cancer Mother   . Hypertension Mother   . Leukemia Father   . Myelodysplastic syndrome Father     CURRENT MEDICATIONS:  Outpatient Encounter Medications as of 04/06/2019  Medication Sig Note  . buPROPion (WELLBUTRIN XL) 150 MG 24 hr tablet Take 150 mg by mouth daily.   [name] ibuprofen (ADVIL) 400 MG tablet Take 800 mg by mouth every 6 (six) hours as needed (pain).    [name] oxyCODONE (OXY IR/ROXICODONE) 5 MG immediate release tablet Take one tab po 30 minutes prior to XRT and po qHS (Patient taking differently: Take 5 mg by mouth 2 (two) times a day. ) 04/01/2019: LF @ [facility] on 03-30-19 # 60 DS 30  . [DISCONTINUED] methylPREDNISolone (MEDROL DOSEPAK) 4 MG TBPK tablet Take 4 mg by mouth See admin instructions. 6 day tapered dose 04/01/2019: Completed regimen today  . fluconazole (DIFLUCAN) 100 MG tablet Take 1 tablet (100 mg total) by mouth daily.   . furosemide (LASIX) 20 MG tablet Take 1 tablet (20 mg total) by mouth once as needed for up to 1 dose (Take '20mg'$  once daily as needed).   . [DISCONTINUED] furosemide (LASIX) 40 MG tablet Take 1 tablet (40 mg total) by mouth daily for 14 days.    No facility-administered encounter medications on file as of 04/06/2019.     ALLERGIES:  No Known Allergies   PHYSICAL EXAM:  ECOG Performance status: 1  Vitals:   04/06/19 0800  BP: (!) 101/59  Pulse: (!) 104  Resp: 18  Temp: 97.9 F (36.6 C)  SpO2: 97%   Filed Weights   04/06/19 0800  Weight: 285 lb 2 oz (129.3 kg)    Physical Exam Vitals signs reviewed.  Constitutional:      Appearance: Normal appearance.  Neck:      Comments: Diffuse swelling at the submental region. Cardiovascular:     Rate and Rhythm: Normal rate and regular rhythm.     Heart sounds: Normal heart sounds.  Pulmonary:     Effort: Pulmonary effort is normal.     Breath sounds: Normal breath sounds.  Abdominal:     General: There is no distension.     Palpations: Abdomen is soft. There is no mass.  Lymphadenopathy:     Cervical: Cervical adenopathy present.  Skin:    General: Skin is warm.  Neurological:     General: No focal deficit present.     Mental Status: She is alert and oriented to person, place, and time.  Psychiatric:        Mood and Affect: Mood normal.        Behavior: Behavior normal.      LABORATORY DATA:  I have reviewed the labs as listed.  CBC    Component Value Date/Time   WBC 7.6 04/06/2019 0806   RBC 4.13 04/06/2019 0806   HGB 11.8 (L) 04/06/2019 0806   HCT 36.8 04/06/2019 0806   PLT 179 04/06/2019 0806   MCV 89.1 04/06/2019 0806   MCH 28.6 04/06/2019 0806   MCHC 32.1 04/06/2019 0806   RDW 14.6 04/06/2019 0806   LYMPHSABS 0.4 (L) 04/06/2019 0806   MONOABS 0.7 04/06/2019 0806   EOSABS 0.2 04/06/2019 0806   BASOSABS 0.0 04/06/2019 0806   CMP Latest Ref Rng & Units 04/06/2019 04/01/2019 03/31/2019  Glucose 70 - 99 mg/dL 115(H) 119(H) 119(H)  BUN 6 - 20 mg/dL 15 18 23(H)  Creatinine 0.44 - 1.00 mg/dL 1.03(H) 0.99 1.03(H)  Sodium 135 - 145 mmol/L 137 138 141  Potassium 3.5 - 5.1 mmol/L 3.5 3.8 3.7  Chloride 98 - 111 mmol/L 95(L) 102 103  CO2 22 - 32 mmol/L '25 27 27  '$ Calcium 8.9 - 10.3 mg/dL 9.1 9.1 9.6  Total Protein 6.5 - 8.1 g/dL 7.3 - -  Total Bilirubin 0.3 - 1.2 mg/dL 0.8 - -  Alkaline Phos 38 - 126 U/L 80 - -  AST 15 - 41 U/L 18 - -  ALT 0 - 44 U/L 31 - -       DIAGNOSTIC IMAGING:  I have independently reviewed the scans and discussed with the patient.   I have reviewed Venita Lick LPN's note and agree with the documentation.  I personally performed a face-to-face visit, made  revisions and my assessment and plan is as follows.    ASSESSMENT & PLAN:   Squamous cell lung cancer, unspecified laterality ([place]) 1.  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  Patient is a current active smoker. - 03/06/2019: CT neck: Revealed a 2.4 cm retropharyngeal fluid collection at C3 level and mild mass effect on the posterior oropharynx. No peritonsilar abscess was noted. She had cervical adenopathy including a level IIa node measuring 1.6 cm on the right and 1.4 cm on the left. There was a right sided 6.1 x 4.8 cm nodal conglomerate at level IV. There was also partially visualized adenopathy measuring 6.4 x 4.1 cm in the right paratracheal region.  - 03/14/2019: Biopsy of her right cervical adenopathy. Pathology suggest non-small cell cancer, squamous cell. Cells are positive for cytokeratin 7, cytokeratin 5/6, MOC-31 and p63 but negative for cytokeratin 20, mucicarmine, S100, Pax-8, and GATA-3. P16 negative -Completed palliative XRT on 03/31/2019. - PET CT scan on 04/04/2019 shows markedly hypermetabolic nodal disease in the right supraclavicular region, right thoracic inlet/high right paratracheal station.  Lymphadenopathy in the right neck and both axilla show low-level FDG accumulation.  Markedly hypermetabolic bilateral adrenal nodules concerning for metastasis. -Even though there is no clear lung primary, this is mostly consistent with metastatic lung cancer. -She is scheduled for MRI of the brain tomorrow. -I have discussed palliative chemotherapy with carboplatin, paclitaxel and pembrolizumab every 21 days for  4-6 cycles depending on response followed by maintenance pembrolizumab.  We talked about side effects in detail.  Treatment intent in the palliative setting was also discussed. -She had PICC line placed by IR.  We will plan to start her chemotherapy on Friday. -She does have oral thrush.  We will call in  Diflucan for 5 days.  2.  Neck swelling: -She is taking Lasix 20 to 40 mg daily.  We have given a refill. -She is still not able to lie flat.  3.  Right shoulder pain: -She takes oxycodone 5 mg as needed, mostly at bedtime.      Total time spent is 40 minutes with more than 50% of the time spent face-to-face discussing treatment plan, scan results and coordination of care.    Orders placed this encounter:  No orders of the defined types were placed in this encounter.     Derek Jack, MD [place] [phone]

## 2019-04-07 ENCOUNTER — Other Ambulatory Visit (HOSPITAL_COMMUNITY): Payer: Self-pay | Admitting: Hematology

## 2019-04-07 ENCOUNTER — Ambulatory Visit (HOSPITAL_COMMUNITY)
Admission: RE | Admit: 2019-04-07 | Discharge: 2019-04-07 | Disposition: A | Discharge status: Home / Self Care | Payer: BC Managed Care – PPO | Source: Ambulatory Visit | Attending: Hematology | Admitting: Hematology

## 2019-04-07 ENCOUNTER — Encounter (HOSPITAL_COMMUNITY): Payer: Self-pay

## 2019-04-07 ENCOUNTER — Other Ambulatory Visit: Payer: Self-pay

## 2019-04-07 DIAGNOSIS — C779 Secondary and unspecified malignant neoplasm of lymph node, unspecified: Secondary | ICD-10-CM

## 2019-04-07 MED ORDER — GADOBUTROL 1 MMOL/ML IV SOLN
10.0000 mL | Freq: Once | INTRAVENOUS | Status: AC | PRN
  Administered 2019-04-07: 10 mL via INTRAVENOUS

## 2019-04-08 ENCOUNTER — Encounter (HOSPITAL_COMMUNITY): Payer: Self-pay | Admitting: Nurse Practitioner

## 2019-04-08 ENCOUNTER — Encounter (HOSPITAL_COMMUNITY): Payer: Self-pay | Admitting: *Deleted

## 2019-04-08 ENCOUNTER — Encounter (HOSPITAL_COMMUNITY): Payer: Self-pay

## 2019-04-08 ENCOUNTER — Inpatient Hospital Stay (HOSPITAL_COMMUNITY): Payer: BC Managed Care – PPO

## 2019-04-08 VITALS — BP 112/42 | HR 98 | Temp 97.6°F | Resp 20 | Wt 285.0 lb

## 2019-04-08 DIAGNOSIS — C3490 Malignant neoplasm of unspecified part of unspecified bronchus or lung: Secondary | ICD-10-CM

## 2019-04-08 MED ORDER — SODIUM CHLORIDE 0.9 % IV SOLN
Freq: Once | INTRAVENOUS | Status: AC
  Administered 2019-04-08: 10:00:00 via INTRAVENOUS
  Filled 2019-04-08: qty 5

## 2019-04-08 MED ORDER — PALONOSETRON HCL INJECTION 0.25 MG/5ML
0.2500 mg | Freq: Once | INTRAVENOUS | Status: AC
  Administered 2019-04-08: 0.25 mg via INTRAVENOUS
  Filled 2019-04-08: qty 5

## 2019-04-08 MED ORDER — SODIUM CHLORIDE 0.9 % IV SOLN
200.0000 mg/m2 | Freq: Once | INTRAVENOUS | Status: AC
  Administered 2019-04-08: 492 mg via INTRAVENOUS
  Filled 2019-04-08: qty 82

## 2019-04-08 MED ORDER — DIPHENHYDRAMINE HCL 50 MG/ML IJ SOLN
50.0000 mg | Freq: Once | INTRAMUSCULAR | Status: AC
  Administered 2019-04-08: 50 mg via INTRAVENOUS
  Filled 2019-04-08: qty 1

## 2019-04-08 MED ORDER — SODIUM CHLORIDE 0.9 % IV SOLN
200.0000 mg | Freq: Once | INTRAVENOUS | Status: AC
  Administered 2019-04-08: 200 mg via INTRAVENOUS
  Filled 2019-04-08: qty 8

## 2019-04-08 MED ORDER — SODIUM CHLORIDE 0.9 % IV SOLN
900.0000 mg | Freq: Once | INTRAVENOUS | Status: AC
  Administered 2019-04-08: 900 mg via INTRAVENOUS
  Filled 2019-04-08: qty 90

## 2019-04-08 MED ORDER — PEGFILGRASTIM 6 MG/0.6ML ~~LOC~~ PSKT
6.0000 mg | PREFILLED_SYRINGE | Freq: Once | SUBCUTANEOUS | Status: DC
  Filled 2019-04-08: qty 0.6

## 2019-04-08 MED ORDER — SODIUM CHLORIDE 0.9 % IV SOLN
Freq: Once | INTRAVENOUS | Status: AC
  Administered 2019-04-08: 09:00:00 via INTRAVENOUS

## 2019-04-08 MED ORDER — FAMOTIDINE IN NACL 20-0.9 MG/50ML-% IV SOLN
20.0000 mg | Freq: Once | INTRAVENOUS | Status: AC
  Administered 2019-04-08: 20 mg via INTRAVENOUS
  Filled 2019-04-08: qty 50

## 2019-04-08 MED ORDER — SODIUM CHLORIDE 0.9% FLUSH
10.0000 mL | INTRAVENOUS | Status: DC | PRN
  Administered 2019-04-08: 10 mL
  Filled 2019-04-08: qty 10

## 2019-04-08 MED ORDER — HEPARIN SOD (PORK) LOCK FLUSH 100 UNIT/ML IV SOLN
250.0000 [IU] | Freq: Once | INTRAVENOUS | Status: AC | PRN
  Administered 2019-04-08: 250 [IU]

## 2019-04-08 NOTE — Progress Notes (Addendum)
I contacted Tara Simpson, PA for radiation oncology to inquire about treatment for the radiation burns.  Patient was complaining of the burns and didn't know what to put on them.  I received orders from Tara Aguilar, [place] to place neosporin with pain relief if the burns are open/burning.  Aquaphor, cocoa butter, or aloe if they are dry/cracked.  Hydrocortisone OTC for itching.    I relayed the information to the patient in writing and she verbalizes understanding.    Here are the pictures of patient's neck, requested by Tara Simpson, PA.

## 2019-04-08 NOTE — Progress Notes (Signed)
04/08/19  New start Taxol, dose confirmed with Randi Lockamy,NP.  Henreitta Leber, PharmD

## 2019-04-08 NOTE — Progress Notes (Signed)
RN at bedside to increase Paclitaxel. Pt complains of feeling a surge of heat come over her body. RNester NP at the bedside. Plan of care discussed. VO received to keep rate the same at this time. 167 ml/hr.

## 2019-04-08 NOTE — Progress Notes (Signed)
Labs drawn yesterday, meet parameters for treatment today. Proceed as planned. Teaching and consent done by RN this morning.   HR 113, NP auscultated HR on exam this morning. HR is regular, proceed with treatment today as planned per NP .

## 2019-04-08 NOTE — Progress Notes (Signed)
Chemotherapy/Immunotherapy education packet given and discussed with pt in detail. Discussed   diagnosis and staging, and tx regimen. Reviewed chemotherapy/immunotherapy medications and side   effects, as well as pre-medications. Instructed on how to manage side effects at home, and when to   call the clinic. Importance of fever/chills discussed with pt. Discussed precautions to implement   at home after receiving tx, as well as self care strategies. Phone numbers provided for clinic   during regular working hours, also how to reach the clinic after hours and on weekends. Pt provided   the opportunity to ask questions - all questions answered to her satisfaction.

## 2019-04-08 NOTE — Progress Notes (Signed)
Treatment given per orders. Patient tolerated it well without problems. Vitals stable and discharged home from clinic via wheelchair Follow up as scheduled.  

## 2019-04-08 NOTE — Patient Instructions (Signed)

## 2019-04-11 ENCOUNTER — Telehealth (HOSPITAL_COMMUNITY): Payer: Self-pay | Admitting: Hematology

## 2019-04-11 ENCOUNTER — Other Ambulatory Visit: Payer: Self-pay

## 2019-04-11 ENCOUNTER — Other Ambulatory Visit (HOSPITAL_COMMUNITY): Payer: Self-pay | Admitting: *Deleted

## 2019-04-11 ENCOUNTER — Inpatient Hospital Stay (HOSPITAL_COMMUNITY): Payer: BC Managed Care – PPO

## 2019-04-11 VITALS — BP 110/62 | HR 96 | Temp 98.0°F | Resp 20

## 2019-04-11 DIAGNOSIS — C3490 Malignant neoplasm of unspecified part of unspecified bronchus or lung: Secondary | ICD-10-CM

## 2019-04-11 DIAGNOSIS — C779 Secondary and unspecified malignant neoplasm of lymph node, unspecified: Secondary | ICD-10-CM

## 2019-04-11 MED ORDER — SODIUM CHLORIDE 0.9% FLUSH
10.0000 mL | Freq: Once | INTRAVENOUS | Status: AC
  Administered 2019-04-11: 10 mL via INTRAVENOUS

## 2019-04-11 MED ORDER — PEGFILGRASTIM INJECTION 6 MG/0.6ML ~~LOC~~
6.0000 mg | PREFILLED_SYRINGE | Freq: Once | SUBCUTANEOUS | Status: AC
  Administered 2019-04-11: 6 mg via SUBCUTANEOUS

## 2019-04-11 MED ORDER — HEPARIN SOD (PORK) LOCK FLUSH 100 UNIT/ML IV SOLN
500.0000 [IU] | Freq: Once | INTRAVENOUS | Status: AC
  Administered 2019-04-11: 500 [IU] via INTRAVENOUS

## 2019-04-11 NOTE — Telephone Encounter (Signed)
Enrolled pt into the [employer] 1st step for neulasta.  CONFIRMATION NUMBER: [id number] MEMBER ID: [id number] RX BIN NUMBER: [id number]

## 2019-04-11 NOTE — Patient Instructions (Signed)
[place] at [facility]  Discharge Instructions:  Neulasta injection received today. PICC line flushed and dressing changed. _______________________________________________________________  Thank you for choosing [place] at [facility] to provide your oncology and hematology care.  To afford each patient quality time with our providers, please arrive at least 15 minutes before your scheduled appointment.  You need to re-schedule your appointment if you arrive 10 or more minutes late.  We strive to give you quality time with our providers, and arriving late affects you and other patients whose appointments are after yours.  Also, if you no show three or more times for appointments you may be dismissed from the clinic.  Again, thank you for choosing [place] at [facility] hope is that these requests will allow you access to exceptional care and in a timely manner. _______________________________________________________________  If you have questions after your visit, please contact our office at [phone] between the hours of 8:30 a.m. and 5:00 p.m. Voicemails left after 4:30 p.m. will not be returned until the following business day. _______________________________________________________________  For prescription refill requests, have your pharmacy contact our office. _______________________________________________________________  Recommendations made by the consultant and any test results will be sent to your referring physician. _______________________________________________________________

## 2019-04-11 NOTE — Progress Notes (Signed)
Patient presents today for Neulasta injection. 24hr chemo assessment- patient states she has been very tired the last few days. She states she doesn't have an appetite and hasn't been eating much. She denies nausea or vomiting. I made her aware to please let us know if this continues and we can bring her in for IV fluids. She verbalized understanding. Patient seems very flat affected but interacts when I converse about her care and her son. Neulasta injection given without incident or complaint. VSS. Pt discharged via wheelchair to short stay entrance. I reiterated to patient to please let us know of any concerns that arise.

## 2019-04-11 NOTE — Progress Notes (Signed)
Orders  Placed for home health to come out and teach patient/family how to flush picc line and change dressing.

## 2019-04-13 ENCOUNTER — Encounter: Payer: Self-pay | Admitting: General Practice

## 2019-04-13 NOTE — Progress Notes (Signed)
[place] CSW Progress Notes'  Scheduled outreach call to patient to discuss psychosocial needs/concerns.  No answer, left VM requesting call back.  Edwyna Shell, LCSW Clinical Social Worker Phone:  [phone]

## 2019-04-14 ENCOUNTER — Other Ambulatory Visit: Payer: Self-pay

## 2019-04-14 ENCOUNTER — Inpatient Hospital Stay (HOSPITAL_COMMUNITY): Payer: BC Managed Care – PPO

## 2019-04-14 ENCOUNTER — Other Ambulatory Visit (HOSPITAL_COMMUNITY): Payer: Self-pay | Admitting: Hematology

## 2019-04-14 ENCOUNTER — Encounter (HOSPITAL_COMMUNITY): Payer: BC Managed Care – PPO

## 2019-04-14 DIAGNOSIS — C3490 Malignant neoplasm of unspecified part of unspecified bronchus or lung: Secondary | ICD-10-CM | POA: Diagnosis not present

## 2019-04-14 MED ORDER — NYSTATIN 100000 UNIT/GM EX POWD: Freq: Four times a day (QID) | CUTANEOUS | 3 refills | Status: DC

## 2019-04-14 MED ORDER — HEPARIN SOD (PORK) LOCK FLUSH 100 UNIT/ML IV SOLN
250.0000 [IU] | Freq: Once | INTRAVENOUS | Status: AC
  Administered 2019-04-14: 250 [IU] via INTRAVENOUS

## 2019-04-14 MED ORDER — SODIUM CHLORIDE 0.9% FLUSH
10.0000 mL | Freq: Once | INTRAVENOUS | Status: AC
  Administered 2019-04-14: 10 mL via INTRAVENOUS

## 2019-04-14 NOTE — Patient Instructions (Signed)
[facility] at [facility] _______________________________________________________________  Thank you for choosing [facility] at [facility] to provide your oncology and hematology care.  To afford each patient quality time with our providers, please arrive at least 15 minutes before your scheduled appointment.  You need to re-schedule your appointment if you arrive 10 or more minutes late.  We strive to give you quality time with our providers, and arriving late affects you and other patients whose appointments are after yours.  Also, if you no show three or more times for appointments you may be dismissed from the clinic.  Again, thank you for choosing [facility] at [facility]. Our hope is that these requests will allow you access to exceptional care and in a timely manner. _______________________________________________________________  If you have questions after your visit, please contact our office at [phone] between the hours of 8:30 a.m. and 5:00 p.m. Voicemails left after 4:30 p.m. will not be returned until the following business day. _______________________________________________________________  For prescription refill requests, have your pharmacy contact our office. _______________________________________________________________  Recommendations made by the consultant and any test results will be sent to your referring physician. _______________________________________________________________ 

## 2019-04-14 NOTE — Progress Notes (Signed)
Pt presents today for PICC dressing change. She reports that she has been having an odor coming from her neck over the last few days. She states that it smells so bad that it makes her nauseated. She has been taking her Compazine frequently. She states that she washes and keeps it clean, but where it is swollen in the fold, it hurts and smells. I could see that it looked damp in the fold. Harriet Pho, NP looked at patients neck and said that it looked like yeast. She called in a RX for Nystatin powder and explained to patient how to care for it. Pt verbalized understanding. Discharged via wheelchair by staff in satisfactory condition.

## 2019-04-18 ENCOUNTER — Encounter (HOSPITAL_COMMUNITY): Payer: BC Managed Care – PPO

## 2019-04-19 ENCOUNTER — Inpatient Hospital Stay (HOSPITAL_COMMUNITY): Payer: BC Managed Care – PPO

## 2019-04-19 ENCOUNTER — Other Ambulatory Visit: Payer: Self-pay

## 2019-04-19 ENCOUNTER — Encounter (HOSPITAL_COMMUNITY): Payer: Self-pay

## 2019-04-19 ENCOUNTER — Other Ambulatory Visit (HOSPITAL_COMMUNITY): Payer: Self-pay | Admitting: Nurse Practitioner

## 2019-04-19 VITALS — BP 109/67 | HR 112 | Temp 98.3°F | Resp 18

## 2019-04-19 DIAGNOSIS — C3490 Malignant neoplasm of unspecified part of unspecified bronchus or lung: Secondary | ICD-10-CM | POA: Diagnosis not present

## 2019-04-19 MED ORDER — SODIUM CHLORIDE 0.9% FLUSH
10.0000 mL | Freq: Once | INTRAVENOUS | Status: AC
  Administered 2019-04-19: 10 mL via INTRAVENOUS

## 2019-04-19 MED ORDER — OXYCODONE HCL 5 MG PO TABS: 5.0000 mg | ORAL_TABLET | Freq: Two times a day (BID) | ORAL | 0 refills | Status: DC

## 2019-04-19 MED ORDER — NYSTATIN 100000 UNIT/GM EX OINT: 1.0000 "application " | TOPICAL_OINTMENT | Freq: Three times a day (TID) | CUTANEOUS | 3 refills | Status: DC

## 2019-04-19 MED ORDER — HEPARIN SOD (PORK) LOCK FLUSH 100 UNIT/ML IV SOLN
500.0000 [IU] | Freq: Once | INTRAVENOUS | Status: AC
  Administered 2019-04-19: 500 [IU] via INTRAVENOUS

## 2019-04-19 NOTE — Progress Notes (Signed)
Patient for picc line flush and dressing change.  Good blood return noted with both lines.  Dressing changed per policy with no complaints of pain with flush or dressing change.  VSs with discharge and left by wheelchair with no s/s of distress noted.

## 2019-04-20 ENCOUNTER — Other Ambulatory Visit (HOSPITAL_COMMUNITY): Payer: Self-pay | Admitting: *Deleted

## 2019-04-20 ENCOUNTER — Other Ambulatory Visit (HOSPITAL_COMMUNITY): Payer: Self-pay | Admitting: Hematology

## 2019-04-20 DIAGNOSIS — C3490 Malignant neoplasm of unspecified part of unspecified bronchus or lung: Secondary | ICD-10-CM

## 2019-04-20 MED ORDER — FLUCONAZOLE 200 MG PO TABS: 200.0000 mg | ORAL_TABLET | Freq: Every day | ORAL | 3 refills | Status: AC

## 2019-04-20 MED ORDER — OXYCODONE HCL 5 MG PO TABS: 5.0000 mg | ORAL_TABLET | Freq: Two times a day (BID) | ORAL | 0 refills | Status: DC

## 2019-04-21 ENCOUNTER — Encounter (HOSPITAL_COMMUNITY): Payer: BC Managed Care – PPO

## 2019-04-21 ENCOUNTER — Other Ambulatory Visit (HOSPITAL_COMMUNITY): Payer: Self-pay | Admitting: Hematology

## 2019-04-21 MED ORDER — OXYCODONE HCL 5 MG PO TABS: 5.0000 mg | ORAL_TABLET | ORAL | 0 refills | Status: DC | PRN

## 2019-04-22 ENCOUNTER — Other Ambulatory Visit: Payer: Self-pay

## 2019-04-22 ENCOUNTER — Encounter (HOSPITAL_COMMUNITY): Payer: Self-pay

## 2019-04-22 ENCOUNTER — Inpatient Hospital Stay (HOSPITAL_COMMUNITY): Payer: BC Managed Care – PPO

## 2019-04-22 DIAGNOSIS — C3490 Malignant neoplasm of unspecified part of unspecified bronchus or lung: Secondary | ICD-10-CM | POA: Diagnosis not present

## 2019-04-22 MED ORDER — SODIUM CHLORIDE 0.9% FLUSH
3.0000 mL | INTRAVENOUS | Status: DC | PRN
  Administered 2019-04-22: 3 mL via INTRAVENOUS
  Filled 2019-04-22: qty 10

## 2019-04-22 MED ORDER — HEPARIN SOD (PORK) LOCK FLUSH 100 UNIT/ML IV SOLN
500.0000 [IU] | Freq: Once | INTRAVENOUS | Status: AC
  Administered 2019-04-22: 250 [IU] via INTRAVENOUS

## 2019-04-22 NOTE — Patient Instructions (Signed)
[place] at [facility] Discharge Instructions  PICC line flushed per protocol today. Follow-up as scheduled. Call clinic for any questions or concerns   Thank you for choosing [place] at [facility] to provide your oncology and hematology care.  To afford each patient quality time with our provider, please arrive at least 15 minutes before your scheduled appointment time.   If you have a lab appointment with the [facility] please come in thru the  Main Entrance and check in at the main information desk  You need to re-schedule your appointment should you arrive 10 or more minutes late.  We strive to give you quality time with our providers, and arriving late affects you and other patients whose appointments are after yours.  Also, if you no show three or more times for appointments you may be dismissed from the clinic at the providers discretion.     Again, thank you for choosing [facility].  Our hope is that these requests will decrease the amount of time that you wait before being seen by our physicians.       _____________________________________________________________  Should you have questions after your visit to [facility], please contact our office at [phone] between the hours of 8:00 a.m. and 4:30 p.m.  Voicemails left after 4:00 p.m. will not be returned until the following business day.  For prescription refill requests, have your pharmacy contact our office and allow 72 hours.    Cancer Center Support Programs:   > Cancer Support Group  2nd Tuesday of the month 1pm-2pm, Journey Room

## 2019-04-22 NOTE — Progress Notes (Signed)
Tara Aguilar tolerated PICC line flushed well without complaints or incident. VSS Pt discharged via wheelchair in satisfactory condition

## 2019-04-25 ENCOUNTER — Other Ambulatory Visit: Payer: Self-pay

## 2019-04-25 ENCOUNTER — Inpatient Hospital Stay (HOSPITAL_COMMUNITY): Payer: BC Managed Care – PPO

## 2019-04-25 DIAGNOSIS — C3490 Malignant neoplasm of unspecified part of unspecified bronchus or lung: Secondary | ICD-10-CM | POA: Diagnosis not present

## 2019-04-25 MED ORDER — HEPARIN SOD (PORK) LOCK FLUSH 100 UNIT/ML IV SOLN
250.0000 [IU] | Freq: Once | INTRAVENOUS | Status: AC
  Administered 2019-04-25: 250 [IU] via INTRAVENOUS

## 2019-04-25 MED ORDER — SODIUM CHLORIDE 0.9% FLUSH
3.0000 mL | INTRAVENOUS | Status: DC | PRN
  Administered 2019-04-25: 3 mL via INTRAVENOUS
  Filled 2019-04-25: qty 10

## 2019-04-25 NOTE — Patient Instructions (Signed)
[place] at [facility] Discharge Instructions  PICC line flushed per protocol with dressing and cap changed as well. Follow-up as scheduled. Call clinic for any questions or concerns   Thank you for choosing [place] at [facility] to provide your oncology and hematology care.  To afford each patient quality time with our provider, please arrive at least 15 minutes before your scheduled appointment time.   If you have a lab appointment with the [facility] please come in thru the  Main Entrance and check in at the main information desk  You need to re-schedule your appointment should you arrive 10 or more minutes late.  We strive to give you quality time with our providers, and arriving late affects you and other patients whose appointments are after yours.  Also, if you no show three or more times for appointments you may be dismissed from the clinic at the providers discretion.     Again, thank you for choosing [facility].  Our hope is that these requests will decrease the amount of time that you wait before being seen by our physicians.       _____________________________________________________________  Should you have questions after your visit to [facility], please contact our office at [phone] between the hours of 8:00 a.m. and 4:30 p.m.  Voicemails left after 4:00 p.m. will not be returned until the following business day.  For prescription refill requests, have your pharmacy contact our office and allow 72 hours.    Cancer Center Support Programs:   > Cancer Support Group  2nd Tuesday of the month 1pm-2pm, Journey Room

## 2019-04-25 NOTE — Progress Notes (Signed)
Tara Aguilar tolerated PICC line flush with dressing change well without complaints or incident.PICC line flushed easily per protocol and no drainage or redness noted at site. VSS Pt does have some edema in both arms and hands today and she did take one of her Lasix which is ordered prn.Pt denies any pain and no redness noted to these areas Pt will continue to take her Lasix as needed and we will reevaluate the end of the week. VSS Pt discharged via wheelchair in satisfactory condition

## 2019-04-28 ENCOUNTER — Other Ambulatory Visit: Payer: Self-pay

## 2019-04-28 ENCOUNTER — Encounter (HOSPITAL_COMMUNITY): Payer: Self-pay

## 2019-04-28 ENCOUNTER — Inpatient Hospital Stay (HOSPITAL_COMMUNITY): Payer: BC Managed Care – PPO | Attending: Hematology

## 2019-04-28 VITALS — BP 129/84 | HR 108 | Temp 97.7°F | Resp 18

## 2019-04-28 DIAGNOSIS — Z832 Family history of diseases of the blood and blood-forming organs and certain disorders involving the immune mechanism: Secondary | ICD-10-CM | POA: Diagnosis not present

## 2019-04-28 DIAGNOSIS — C3490 Malignant neoplasm of unspecified part of unspecified bronchus or lung: Secondary | ICD-10-CM | POA: Diagnosis present

## 2019-04-28 DIAGNOSIS — Z8249 Family history of ischemic heart disease and other diseases of the circulatory system: Secondary | ICD-10-CM | POA: Diagnosis not present

## 2019-04-28 DIAGNOSIS — R531 Weakness: Secondary | ICD-10-CM | POA: Insufficient documentation

## 2019-04-28 DIAGNOSIS — M7989 Other specified soft tissue disorders: Secondary | ICD-10-CM | POA: Diagnosis not present

## 2019-04-28 DIAGNOSIS — R5383 Other fatigue: Secondary | ICD-10-CM | POA: Insufficient documentation

## 2019-04-28 DIAGNOSIS — Z452 Encounter for adjustment and management of vascular access device: Secondary | ICD-10-CM | POA: Diagnosis not present

## 2019-04-28 DIAGNOSIS — M549 Dorsalgia, unspecified: Secondary | ICD-10-CM | POA: Diagnosis not present

## 2019-04-28 DIAGNOSIS — C7971 Secondary malignant neoplasm of right adrenal gland: Secondary | ICD-10-CM | POA: Diagnosis not present

## 2019-04-28 DIAGNOSIS — Z5112 Encounter for antineoplastic immunotherapy: Secondary | ICD-10-CM | POA: Insufficient documentation

## 2019-04-28 DIAGNOSIS — R590 Localized enlarged lymph nodes: Secondary | ICD-10-CM | POA: Insufficient documentation

## 2019-04-28 DIAGNOSIS — I82612 Acute embolism and thrombosis of superficial veins of left upper extremity: Secondary | ICD-10-CM | POA: Insufficient documentation

## 2019-04-28 DIAGNOSIS — Z5111 Encounter for antineoplastic chemotherapy: Secondary | ICD-10-CM | POA: Insufficient documentation

## 2019-04-28 DIAGNOSIS — Z79899 Other long term (current) drug therapy: Secondary | ICD-10-CM | POA: Diagnosis not present

## 2019-04-28 DIAGNOSIS — F17210 Nicotine dependence, cigarettes, uncomplicated: Secondary | ICD-10-CM | POA: Insufficient documentation

## 2019-04-28 DIAGNOSIS — R110 Nausea: Secondary | ICD-10-CM | POA: Diagnosis not present

## 2019-04-28 DIAGNOSIS — M25511 Pain in right shoulder: Secondary | ICD-10-CM | POA: Diagnosis not present

## 2019-04-28 DIAGNOSIS — Z7901 Long term (current) use of anticoagulants: Secondary | ICD-10-CM | POA: Insufficient documentation

## 2019-04-28 DIAGNOSIS — Z801 Family history of malignant neoplasm of trachea, bronchus and lung: Secondary | ICD-10-CM | POA: Insufficient documentation

## 2019-04-28 DIAGNOSIS — Z806 Family history of leukemia: Secondary | ICD-10-CM | POA: Diagnosis not present

## 2019-04-28 DIAGNOSIS — R0602 Shortness of breath: Secondary | ICD-10-CM | POA: Insufficient documentation

## 2019-04-28 DIAGNOSIS — Z5189 Encounter for other specified aftercare: Secondary | ICD-10-CM | POA: Diagnosis not present

## 2019-04-28 MED ORDER — HEPARIN SOD (PORK) LOCK FLUSH 100 UNIT/ML IV SOLN
500.0000 [IU] | Freq: Once | INTRAVENOUS | Status: AC
  Administered 2019-04-28: 300 [IU] via INTRAVENOUS

## 2019-04-28 MED ORDER — SODIUM CHLORIDE 0.9% FLUSH
10.0000 mL | INTRAVENOUS | Status: DC | PRN
  Administered 2019-04-28: 10 mL via INTRAVENOUS
  Filled 2019-04-28: qty 10

## 2019-04-28 NOTE — Progress Notes (Signed)
Tara Aguilar presented for PICC line flush. PICC line located left arm Good blood return present. PICC line flushed with 40ml NS and 300U/68ml Heparin in each lumen Procedure without incident. Patient tolerated procedure well.  Vitals stable and discharged home from clinic ambulatory. Follow up as scheduled.

## 2019-05-02 ENCOUNTER — Inpatient Hospital Stay (HOSPITAL_BASED_OUTPATIENT_CLINIC_OR_DEPARTMENT_OTHER): Payer: BC Managed Care – PPO | Admitting: Hematology

## 2019-05-02 ENCOUNTER — Inpatient Hospital Stay (HOSPITAL_COMMUNITY): Payer: BC Managed Care – PPO

## 2019-05-02 ENCOUNTER — Ambulatory Visit (HOSPITAL_COMMUNITY): Payer: BC Managed Care – PPO

## 2019-05-02 ENCOUNTER — Other Ambulatory Visit: Payer: Self-pay

## 2019-05-02 ENCOUNTER — Encounter (HOSPITAL_COMMUNITY): Payer: Self-pay | Admitting: Hematology

## 2019-05-02 VITALS — BP 134/79 | HR 101 | Temp 98.6°F | Resp 16

## 2019-05-02 VITALS — BP 123/72 | HR 108 | Temp 96.9°F | Resp 16 | Wt 275.5 lb

## 2019-05-02 DIAGNOSIS — Z8249 Family history of ischemic heart disease and other diseases of the circulatory system: Secondary | ICD-10-CM

## 2019-05-02 DIAGNOSIS — I82612 Acute embolism and thrombosis of superficial veins of left upper extremity: Secondary | ICD-10-CM

## 2019-05-02 DIAGNOSIS — M7989 Other specified soft tissue disorders: Secondary | ICD-10-CM

## 2019-05-02 DIAGNOSIS — C3490 Malignant neoplasm of unspecified part of unspecified bronchus or lung: Secondary | ICD-10-CM

## 2019-05-02 DIAGNOSIS — C7971 Secondary malignant neoplasm of right adrenal gland: Secondary | ICD-10-CM

## 2019-05-02 DIAGNOSIS — M549 Dorsalgia, unspecified: Secondary | ICD-10-CM

## 2019-05-02 DIAGNOSIS — Z832 Family history of diseases of the blood and blood-forming organs and certain disorders involving the immune mechanism: Secondary | ICD-10-CM

## 2019-05-02 DIAGNOSIS — M25511 Pain in right shoulder: Secondary | ICD-10-CM

## 2019-05-02 DIAGNOSIS — Z7901 Long term (current) use of anticoagulants: Secondary | ICD-10-CM

## 2019-05-02 DIAGNOSIS — Z806 Family history of leukemia: Secondary | ICD-10-CM

## 2019-05-02 DIAGNOSIS — R110 Nausea: Secondary | ICD-10-CM

## 2019-05-02 DIAGNOSIS — R590 Localized enlarged lymph nodes: Secondary | ICD-10-CM

## 2019-05-02 DIAGNOSIS — Z79899 Other long term (current) drug therapy: Secondary | ICD-10-CM

## 2019-05-02 DIAGNOSIS — R531 Weakness: Secondary | ICD-10-CM

## 2019-05-02 DIAGNOSIS — F17210 Nicotine dependence, cigarettes, uncomplicated: Secondary | ICD-10-CM

## 2019-05-02 DIAGNOSIS — R5383 Other fatigue: Secondary | ICD-10-CM

## 2019-05-02 DIAGNOSIS — Z801 Family history of malignant neoplasm of trachea, bronchus and lung: Secondary | ICD-10-CM

## 2019-05-02 LAB — CBC WITH DIFFERENTIAL/PLATELET
Abs Immature Granulocytes: 0.08 10*3/uL — ABNORMAL HIGH (ref 0.00–0.07)
Basophils Absolute: 0.1 10*3/uL (ref 0.0–0.1)
Basophils Relative: 1 %
Eosinophils Absolute: 0.1 10*3/uL (ref 0.0–0.5)
Eosinophils Relative: 1 %
HCT: 37.1 % (ref 36.0–46.0)
Hemoglobin: 11.7 g/dL — ABNORMAL LOW (ref 12.0–15.0)
Immature Granulocytes: 1 %
Lymphocytes Relative: 10 %
Lymphs Abs: 0.7 10*3/uL (ref 0.7–4.0)
MCH: 29 pg (ref 26.0–34.0)
MCHC: 31.5 g/dL (ref 30.0–36.0)
MCV: 91.8 fL (ref 80.0–100.0)
Monocytes Absolute: 0.6 10*3/uL (ref 0.1–1.0)
Monocytes Relative: 9 %
Neutro Abs: 5.6 10*3/uL (ref 1.7–7.7)
Neutrophils Relative %: 78 %
Platelets: 327 10*3/uL (ref 150–400)
RBC: 4.04 MIL/uL (ref 3.87–5.11)
RDW: 16.7 % — ABNORMAL HIGH (ref 11.5–15.5)
WBC: 7.1 10*3/uL (ref 4.0–10.5)
nRBC: 0 % (ref 0.0–0.2)

## 2019-05-02 LAB — COMPREHENSIVE METABOLIC PANEL
ALT: 37 U/L (ref 0–44)
AST: 26 U/L (ref 15–41)
Albumin: 3.5 g/dL (ref 3.5–5.0)
Alkaline Phosphatase: 65 U/L (ref 38–126)
Anion gap: 11 (ref 5–15)
BUN: 12 mg/dL (ref 6–20)
CO2: 25 mmol/L (ref 22–32)
Calcium: 9.2 mg/dL (ref 8.9–10.3)
Chloride: 101 mmol/L (ref 98–111)
Creatinine, Ser: 0.97 mg/dL (ref 0.44–1.00)
GFR calc Af Amer: >60 mL/min (ref >60)
GFR calc non Af Amer: >60 mL/min (ref >60)
Glucose, Bld: 111 mg/dL — ABNORMAL HIGH (ref 70–99)
Potassium: 3.6 mmol/L (ref 3.5–5.1)
Sodium: 137 mmol/L (ref 135–145)
Total Bilirubin: 0.5 mg/dL (ref 0.3–1.2)
Total Protein: 7.3 g/dL (ref 6.5–8.1)

## 2019-05-02 LAB — TSH: TSH: 2.031 u[IU]/mL (ref 0.350–4.500)

## 2019-05-02 MED ORDER — FAMOTIDINE IN NACL 20-0.9 MG/50ML-% IV SOLN
INTRAVENOUS | Status: AC
  Filled 2019-05-02: qty 50

## 2019-05-02 MED ORDER — SODIUM CHLORIDE 0.9 % IV SOLN
200.0000 mg/m2 | Freq: Once | INTRAVENOUS | Status: AC
  Administered 2019-05-02: 492 mg via INTRAVENOUS
  Filled 2019-05-02: qty 82

## 2019-05-02 MED ORDER — SODIUM CHLORIDE 0.9 % IV SOLN
200.0000 mg | Freq: Once | INTRAVENOUS | Status: AC
  Administered 2019-05-02: 200 mg via INTRAVENOUS
  Filled 2019-05-02: qty 8

## 2019-05-02 MED ORDER — FAMOTIDINE IN NACL 20-0.9 MG/50ML-% IV SOLN
20.0000 mg | Freq: Once | INTRAVENOUS | Status: AC
  Administered 2019-05-02: 20 mg via INTRAVENOUS

## 2019-05-02 MED ORDER — PALONOSETRON HCL INJECTION 0.25 MG/5ML
INTRAVENOUS | Status: AC
  Filled 2019-05-02: qty 5

## 2019-05-02 MED ORDER — HEPARIN SOD (PORK) LOCK FLUSH 100 UNIT/ML IV SOLN
250.0000 [IU] | Freq: Once | INTRAVENOUS | Status: AC | PRN
  Administered 2019-05-02: 250 [IU]

## 2019-05-02 MED ORDER — SODIUM CHLORIDE 0.9% FLUSH
10.0000 mL | INTRAVENOUS | Status: DC | PRN
  Administered 2019-05-02: 10 mL
  Filled 2019-05-02: qty 10

## 2019-05-02 MED ORDER — DIPHENHYDRAMINE HCL 50 MG/ML IJ SOLN
50.0000 mg | Freq: Once | INTRAMUSCULAR | Status: AC
  Administered 2019-05-02: 50 mg via INTRAVENOUS

## 2019-05-02 MED ORDER — SODIUM CHLORIDE 0.9 % IV SOLN
Freq: Once | INTRAVENOUS | Status: AC
  Administered 2019-05-02: 11:00:00 via INTRAVENOUS

## 2019-05-02 MED ORDER — DIPHENHYDRAMINE HCL 50 MG/ML IJ SOLN
INTRAMUSCULAR | Status: AC
  Filled 2019-05-02: qty 1

## 2019-05-02 MED ORDER — PEGFILGRASTIM 6 MG/0.6ML ~~LOC~~ PSKT
6.0000 mg | PREFILLED_SYRINGE | Freq: Once | SUBCUTANEOUS | Status: DC
  Filled 2019-05-02: qty 0.6

## 2019-05-02 MED ORDER — SODIUM CHLORIDE 0.9 % IV SOLN
Freq: Once | INTRAVENOUS | Status: AC
  Administered 2019-05-02: 11:00:00 via INTRAVENOUS
  Filled 2019-05-02: qty 5

## 2019-05-02 MED ORDER — PALONOSETRON HCL INJECTION 0.25 MG/5ML
0.2500 mg | Freq: Once | INTRAVENOUS | Status: AC
  Administered 2019-05-02: 0.25 mg via INTRAVENOUS

## 2019-05-02 MED ORDER — SODIUM CHLORIDE 0.9 % IV SOLN
900.0000 mg | Freq: Once | INTRAVENOUS | Status: AC
  Administered 2019-05-02: 900 mg via INTRAVENOUS
  Filled 2019-05-02: qty 90

## 2019-05-02 NOTE — Patient Instructions (Addendum)
[facility] at [facility] Discharge Instructions  You were seen today by Dr. Katragadda. He went over your recent lab results. He will see you back in 3 weeks for labs and follow up.   Thank you for choosing [facility] at [facility] to provide your oncology and hematology care.  To afford each patient quality time with our provider, please arrive at least 15 minutes before your scheduled appointment time.   If you have a lab appointment with the Cancer Center please come in thru the  Main Entrance and check in at the main information desk  You need to re-schedule your appointment should you arrive 10 or more minutes late.  We strive to give you quality time with our providers, and arriving late affects you and other patients whose appointments are after yours.  Also, if you no show three or more times for appointments you may be dismissed from the clinic at the providers discretion.     Again, thank you for choosing [facility].  Our hope is that these requests will decrease the amount of time that you wait before being seen by our physicians.       _____________________________________________________________  Should you have questions after your visit to [facility], please contact our office at [phone] between the hours of 8:00 a.m. and 4:30 p.m.  Voicemails left after 4:00 p.m. will not be returned until the following business day.  For prescription refill requests, have your pharmacy contact our office and allow 72 hours.    Cancer Center Support Programs:   > Cancer Support Group  2nd Tuesday of the month 1pm-2pm, Journey Room    

## 2019-05-02 NOTE — Assessment & Plan Note (Signed)
1.  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  Patient is a current active smoker. - 03/06/2019: CT neck: Revealed a 2.4 cm retropharyngeal fluid collection at C3 level and mild mass effect on the posterior oropharynx. No peritonsilar abscess was noted. She had cervical adenopathy including a level IIa node measuring 1.6 cm on the right and 1.4 cm on the left. There was a right sided 6.1 x 4.8 cm nodal conglomerate at level IV. There was also partially visualized adenopathy measuring 6.4 x 4.1 cm in the right paratracheal region.  - 03/14/2019: Biopsy of her right cervical adenopathy. Pathology suggest non-small cell cancer, squamous cell. Cells are positive for cytokeratin 7, cytokeratin 5/6, MOC-31 and p63 but negative for cytokeratin 20, mucicarmine, S100, Pax-8, and GATA-3. P16 negative -Completed palliative XRT on 03/31/2019. - PET CT scan on 04/04/2019 shows markedly hypermetabolic nodal disease in the right supraclavicular region, right thoracic inlet/high right paratracheal station.  Lymphadenopathy in the right neck and both axilla show low-level FDG accumulation.  Markedly hypermetabolic bilateral adrenal nodules concerning for metastasis. -Even though there is no clear lung primary, this is mostly consistent with metastatic lung cancer. -MRI of the brain is negative. -Cycle 1 of carboplatin, Taxol and pembrolizumab on 04/08/2019.  Mild nausea with no vomiting. -Overall swelling in the upper trunk has diminished. -I have reviewed her blood work.  She may proceed with cycle 2 without any dose modifications.  She will be reevaluated in 3 weeks.  2.  Neck swelling: -She took Lasix 40 mg over the weekend. -Swelling has improved after first cycle. -She developed swelling in the left upper arm.  There is PICC line in the arm.  Hence I recommended doing a Doppler.  3.  Right shoulder pain: -She takes oxycodone 5 mg as needed, mostly  at bedtime.

## 2019-05-02 NOTE — Patient Instructions (Signed)

## 2019-05-02 NOTE — Progress Notes (Signed)
[address] [place], [place] [address]   CLINIC:  Medical Oncology/Hematology  PCP:  Glenda Chroman, MD [place] [address] [phone]   REASON FOR VISIT:  Follow-up for metastatic squamous cell carcinoma.   BRIEF ONCOLOGIC HISTORY:  Oncology History  Squamous cell lung cancer, unspecified laterality ([place])  04/05/2019 Initial Diagnosis   Squamous cell lung cancer, unspecified laterality ([place])   04/08/2019 -  Chemotherapy   The patient had palonosetron (ALOXI) injection 0.25 mg, 0.25 mg, Intravenous,  Once, 2 of 4 cycles Administration: 0.25 mg (04/08/2019), 0.25 mg (05/02/2019) pegfilgrastim (NEULASTA) injection 6 mg, 6 mg, Subcutaneous, Once, 1 of 1 cycle Administration: 6 mg (04/11/2019) pegfilgrastim (NEULASTA ONPRO KIT) injection 6 mg, 6 mg, Subcutaneous, Once, 2 of 4 cycles CARBOplatin (PARAPLATIN) 900 mg in sodium chloride 0.9 % 500 mL chemo infusion, 900 mg (100 % of original dose 900 mg), Intravenous,  Once, 2 of 4 cycles Dose modification:   (original dose 900 mg, Cycle 1),   (original dose 900 mg, Cycle 2) Administration: 900 mg (04/08/2019), 900 mg (05/02/2019) PACLitaxel (TAXOL) 492 mg in sodium chloride 0.9 % 500 mL chemo infusion (> 101m/m2), 200 mg/m2 = 492 mg, Intravenous,  Once, 2 of 4 cycles Administration: 492 mg (04/08/2019), 492 mg (05/02/2019) pembrolizumab (KEYTRUDA) 200 mg in sodium chloride 0.9 % 50 mL chemo infusion, 200 mg, Intravenous, Once, 2 of 5 cycles Administration: 200 mg (04/08/2019), 200 mg (05/02/2019) fosaprepitant (EMEND) 150 mg, dexamethasone (DECADRON) 12 mg in sodium chloride 0.9 % 145 mL IVPB, , Intravenous,  Once, 2 of 4 cycles Administration:  (04/08/2019),  (05/02/2019)  for chemotherapy treatment.       CANCER STAGING: Cancer Staging No matching staging information was found for the patient.   INTERVAL HISTORY:  Ms. [id number]. female seen for follow-up and cycle 2 of chemotherapy.  She received cycle  1 of chemotherapy on 04/08/2019.  Overall felt better in terms of tightness in the chest and neck region.  Denied any vomiting although had minor nausea.  Denied any diarrhea or constipation.  Reported some more swelling of the left arm for the last few days.  Denies any worsening of her dyspnea on exertion.  Appetite is 25%.  Energy levels are 25%.  Mild depression is stable.  Denies any ER visits or hospitalizations.  No tingling or numbness in the extremities reported.    REVIEW OF SYSTEMS:  Review of Systems  HENT:   Negative for trouble swallowing.   Respiratory: Positive for shortness of breath.   Gastrointestinal: Positive for nausea.  Psychiatric/Behavioral: Positive for depression.  All other systems reviewed and are negative.    PAST MEDICAL/SURGICAL HISTORY:  Past Medical History:  Diagnosis Date   Depression    Glucose intolerance 03/11/2019   Obesity, Class III, BMI 40-49.9 (morbid obesity) ([facility] 03/11/2019   Past Surgical History:  Procedure Laterality Date   CESAREAN SECTION     IRRIGATION AND DEBRIDEMENT ABSCESS  03/15/2019   RETROPHARYNGEAL    TONSILLECTOMY AND ADENOIDECTOMY N/A 03/15/2019   Procedure: IRRIGATION AND DEBRIDEMENT OF RETROPHARYNGEAL ABSCESS;  Surgeon: TLeta Baptist MD;  Location: [facility];  Service: ENT;  Laterality: N/A;     SOCIAL HISTORY:  Social History   Socioeconomic History   Marital status: Widowed    Spouse name: Not on file   Number of children: 1   Years of education: Not on file   Highest education level: Not on file  Occupational History   Not on file  Social Needs   Financial resource strain: Not very hard   Food insecurity    Worry: Never true    Inability: Never true   Transportation needs    Medical: No    Non-medical: No  Tobacco Use   Smoking status: Current Every Day Smoker    Packs/day: 1.00    Years: 25.00    Pack years: 25.00    Types: Cigarettes   Smokeless tobacco: Never Used  Substance and Sexual  Activity   Alcohol use: Not Currently    Comment: social    Drug use: Not Currently   Sexual activity: Not on file  Lifestyle   Physical activity    Days per week: 0 days    Minutes per session: 0 min   Stress: Only a little  Relationships   Social connections    Talks on phone: More than three times a week    Gets together: Twice a week    Attends religious service: Never    Active member of club or organization: No    Attends meetings of clubs or organizations: Never    Relationship status: Widowed   Intimate partner violence    Fear of current or ex partner: No    Emotionally abused: No    Physically abused: No    Forced sexual activity: No  Other Topics Concern   Not on file  Social History Narrative   04-03-2019   Husband passed away four months ago from Cancer of the [place]mpula.   Pt. Has 1 yo son at home.    FAMILY HISTORY:  Family History  Problem Relation Age of Onset   Lung cancer Mother    Hypertension Mother    Leukemia Father    Myelodysplastic syndrome Father     CURRENT MEDICATIONS:  Outpatient Encounter Medications as of 05/02/2019  Medication Sig   nystatin (MYCOSTATIN/NYSTOP) powder Apply topically 4 (four) times daily.   buPROPion (WELLBUTRIN XL) 150 MG 24 hr tablet Take 150 mg by mouth daily.   CARBOPLATIN IV Inject into the vein every 21 ( twenty-one) days. X 4 cycles   furosemide (LASIX) 20 MG tablet Take 1 tablet (20 mg total) by mouth once as needed for up to 1 dose (Take 57m once daily as needed).   ibuprofen (ADVIL) 400 MG tablet Take 800 mg by mouth every 6 (six) hours as needed (pain).    nystatin ointment (MYCOSTATIN) Apply 1 application topically 3 (three) times daily. (Patient not taking: Reported on 05/02/2019)   oxyCODONE (OXY IR/ROXICODONE) 5 MG immediate release tablet Take 1 tablet (5 mg total) by mouth every 4 (four) hours as needed for severe pain.   PACLitaxel (TAXOL IV) Inject into the vein every 21 ( twenty-one)  days. X 4 cycles   PEMBROLIZUMAB IV Inject into the vein every 21 ( twenty-one) days.   prochlorperazine (COMPAZINE) 10 MG tablet Take 1 tablet (10 mg total) by mouth every 6 (six) hours as needed (Nausea or vomiting).   [DISCONTINUED] fluconazole (DIFLUCAN) 100 MG tablet Take 1 tablet (100 mg total) by mouth daily. (Patient not taking: Reported on 05/02/2019)   [DISCONTINUED] nystatin (MYCOSTATIN/NYSTOP) powder Apply topically 4 (four) times daily. (Patient not taking: Reported on 05/02/2019)   [DISCONTINUED] oxyCODONE (OXY IR/ROXICODONE) 5 MG immediate release tablet Take 1 tablet (5 mg total) by mouth 2 (two) times a day. (Patient not taking: Reported on 05/02/2019)   No facility-administered encounter medications on file as  of 05/02/2019.     ALLERGIES:  No Known Allergies   PHYSICAL EXAM:  ECOG Performance status: 1  Vitals:   05/02/19 0910  BP: 123/72  Pulse: (!) 108  Resp: 16  Temp: (!) 96.9 F (36.1 C)  SpO2: 96%   Filed Weights   05/02/19 0910  Weight: 275 lb 8 oz (125 kg)    Physical Exam Vitals signs reviewed.  Constitutional:      Appearance: Normal appearance.  Neck:     Comments: Swelling in the submental region has improved. Cardiovascular:     Rate and Rhythm: Normal rate and regular rhythm.     Heart sounds: Normal heart sounds.  Pulmonary:     Effort: Pulmonary effort is normal.     Breath sounds: Normal breath sounds.  Abdominal:     General: There is no distension.     Palpations: Abdomen is soft. There is no mass.  Lymphadenopathy:     Cervical: Cervical adenopathy present.  Skin:    General: Skin is warm.  Neurological:     General: No focal deficit present.     Mental Status: She is alert and oriented to person, place, and time.  Psychiatric:        Mood and Affect: Mood normal.        Behavior: Behavior normal.    Left upper extremity swelling present.  LABORATORY DATA:  I have reviewed the labs as listed.  CBC    Component Value  Date/Time   WBC 7.1 05/02/2019 0918   RBC 4.04 05/02/2019 0918   HGB 11.7 (L) 05/02/2019 0918   HCT 37.1 05/02/2019 0918   PLT 327 05/02/2019 0918   MCV 91.8 05/02/2019 0918   MCH 29.0 05/02/2019 0918   MCHC 31.5 05/02/2019 0918   RDW 16.7 (H) 05/02/2019 0918   LYMPHSABS 0.7 05/02/2019 0918   MONOABS 0.6 05/02/2019 0918   EOSABS 0.1 05/02/2019 0918   BASOSABS 0.1 05/02/2019 0918   CMP Latest Ref Rng & Units 05/02/2019 04/06/2019 04/01/2019  Glucose 70 - 99 mg/dL 111(H) 115(H) 119(H)  BUN 6 - 20 mg/dL _0 Creatinine 0.44 - 1.00 mg/dL 0.97 1.03(H) 0.99  Sodium 135 - 145 mmol/L 137 137 138  Potassium 3.5 - 5.1 mmol/L 3.6 3.5 3.8  Chloride 98 - 111 mmol/L 101 95(L) 102  CO2 22 - 32 mmol/L _1 Calcium 8.9 - 10.3 mg/dL 9.2 9.1 9.1  Total Protein 6.5 - 8.1 g/dL 7.3 7.3 -  Total Bilirubin 0.3 - 1.2 mg/dL 0.5 0.8 -  Alkaline Phos 38 - 126 U/L 65 80 -  AST 15 - 41 U/L 26 18 -  ALT 0 - 44 U/L 37 31 -       DIAGNOSTIC IMAGING:  I have independently reviewed the scans and discussed with the patient.   I have reviewed Venita Lick LPN's note and agree with the documentation.  I personally performed a face-to-face visit, made revisions and my assessment and plan is as follows.    ASSESSMENT & PLAN:   Squamous cell lung cancer, unspecified laterality ([place]) 1.  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  Patient is a current active smoker. - 03/06/2019: CT neck: Revealed a 2.4 cm retropharyngeal fluid collection at C3 level and mild mass effect on the posterior oropharynx. No peritonsilar abscess was noted. She had cervical adenopathy including a level IIa node measuring 1.6 cm on the  right and 1.4 cm on the left. There was a right sided 6.1 x 4.8 cm nodal conglomerate at level IV. There was also partially visualized adenopathy measuring 6.4 x 4.1 cm in the right paratracheal region.  - 03/14/2019: Biopsy of  her right cervical adenopathy. Pathology suggest non-small cell cancer, squamous cell. Cells are positive for cytokeratin 7, cytokeratin 5/6, MOC-31 and p63 but negative for cytokeratin 20, mucicarmine, S100, Pax-8, and GATA-3. P16 negative -Completed palliative XRT on 03/31/2019. - PET CT scan on 04/04/2019 shows markedly hypermetabolic nodal disease in the right supraclavicular region, right thoracic inlet/high right paratracheal station.  Lymphadenopathy in the right neck and both axilla show low-level FDG accumulation.  Markedly hypermetabolic bilateral adrenal nodules concerning for metastasis. -Even though there is no clear lung primary, this is mostly consistent with metastatic lung cancer. -MRI of the brain is negative. -Cycle 1 of carboplatin, Taxol and pembrolizumab on 04/08/2019.  Mild nausea with no vomiting. -Overall swelling in the upper trunk has diminished. -I have reviewed her blood work.  She may proceed with cycle 2 without any dose modifications.  She will be reevaluated in 3 weeks.  2.  Neck swelling: -She took Lasix 40 mg over the weekend. -Swelling has improved after first cycle. -She developed swelling in the left upper arm.  There is PICC line in the arm.  Hence I recommended doing a Doppler.  3.  Right shoulder pain: -She takes oxycodone 5 mg as needed, mostly at bedtime.      Total time spent is 25 minutes with more than 50% of the time spent face-to-face discussing treatment plan, scan results and coordination of care.    Orders placed this encounter:  Orders Placed This Encounter  Procedures   US Venous Img Upper Uni Left      Derek Jack, [place] [phone]

## 2019-05-02 NOTE — Progress Notes (Signed)
05/02/19  Ok to proceed without urine pregnancy results today.  T.O. Dr Katragadda/Catherine Page RN/Verle Brillhart Ronnald Ramp, PharmD

## 2019-05-02 NOTE — Progress Notes (Signed)
Proceed with treatment today per MD, labs reviewed at office visit.   Dressing change to PICC line done today.  Held neulasta onpro due to ultrasound to be done tomorrow. Will come back for udenyca on Wednesday.   Treatment given per orders. Patient tolerated it well without problems. Vitals stable and discharged home from clinic via wheelchair. Follow up as scheduled.

## 2019-05-03 ENCOUNTER — Ambulatory Visit (HOSPITAL_COMMUNITY)
Admission: RE | Admit: 2019-05-03 | Discharge: 2019-05-03 | Disposition: A | Discharge status: Home / Self Care | Payer: BC Managed Care – PPO | Source: Ambulatory Visit | Attending: Hematology | Admitting: Hematology

## 2019-05-03 ENCOUNTER — Inpatient Hospital Stay (HOSPITAL_BASED_OUTPATIENT_CLINIC_OR_DEPARTMENT_OTHER): Payer: BC Managed Care – PPO | Admitting: Hematology

## 2019-05-03 ENCOUNTER — Encounter (HOSPITAL_COMMUNITY): Payer: Self-pay | Admitting: Hematology

## 2019-05-03 DIAGNOSIS — R590 Localized enlarged lymph nodes: Secondary | ICD-10-CM | POA: Diagnosis not present

## 2019-05-03 DIAGNOSIS — C3490 Malignant neoplasm of unspecified part of unspecified bronchus or lung: Secondary | ICD-10-CM

## 2019-05-03 DIAGNOSIS — C7971 Secondary malignant neoplasm of right adrenal gland: Secondary | ICD-10-CM | POA: Diagnosis not present

## 2019-05-03 DIAGNOSIS — F17210 Nicotine dependence, cigarettes, uncomplicated: Secondary | ICD-10-CM

## 2019-05-03 DIAGNOSIS — Z801 Family history of malignant neoplasm of trachea, bronchus and lung: Secondary | ICD-10-CM

## 2019-05-03 DIAGNOSIS — R531 Weakness: Secondary | ICD-10-CM

## 2019-05-03 DIAGNOSIS — Z832 Family history of diseases of the blood and blood-forming organs and certain disorders involving the immune mechanism: Secondary | ICD-10-CM

## 2019-05-03 DIAGNOSIS — I82612 Acute embolism and thrombosis of superficial veins of left upper extremity: Secondary | ICD-10-CM | POA: Diagnosis not present

## 2019-05-03 DIAGNOSIS — M549 Dorsalgia, unspecified: Secondary | ICD-10-CM

## 2019-05-03 DIAGNOSIS — R5383 Other fatigue: Secondary | ICD-10-CM

## 2019-05-03 DIAGNOSIS — M25511 Pain in right shoulder: Secondary | ICD-10-CM

## 2019-05-03 DIAGNOSIS — Z8249 Family history of ischemic heart disease and other diseases of the circulatory system: Secondary | ICD-10-CM

## 2019-05-03 DIAGNOSIS — Z79899 Other long term (current) drug therapy: Secondary | ICD-10-CM

## 2019-05-03 DIAGNOSIS — Z7901 Long term (current) use of anticoagulants: Secondary | ICD-10-CM

## 2019-05-03 DIAGNOSIS — R110 Nausea: Secondary | ICD-10-CM

## 2019-05-03 DIAGNOSIS — Z806 Family history of leukemia: Secondary | ICD-10-CM

## 2019-05-03 DIAGNOSIS — M7989 Other specified soft tissue disorders: Secondary | ICD-10-CM

## 2019-05-03 MED ORDER — ELIQUIS 5 MG VTE STARTER PACK: ORAL_TABLET | ORAL | 0 refills | Status: DC

## 2019-05-03 NOTE — Progress Notes (Signed)
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  CURRENT THERAPY: carboplatin, Taxol and pembrolizumab   BRIEF ONCOLOGIC HISTORY:  Oncology History  Squamous cell lung cancer, unspecified laterality ([place])  04/05/2019 Initial Diagnosis   Squamous cell lung cancer, unspecified laterality ([place])   04/08/2019 -  Chemotherapy   The patient had palonosetron (ALOXI) injection 0.25 mg, 0.25 mg, Intravenous,  Once, 2 of 4 cycles Administration: 0.25 mg (04/08/2019), 0.25 mg (05/02/2019) pegfilgrastim (NEULASTA) injection 6 mg, 6 mg, Subcutaneous, Once, 1 of 1 cycle Administration: 6 mg (04/11/2019) pegfilgrastim (NEULASTA ONPRO KIT) injection 6 mg, 6 mg, Subcutaneous, Once, 2 of 4 cycles CARBOplatin (PARAPLATIN) 900 mg in sodium chloride 0.9 % 500 mL chemo infusion, 900 mg (100 % of original dose 900 mg), Intravenous,  Once, 2 of 4 cycles Dose modification:   (original dose 900 mg, Cycle 1),   (original dose 900 mg, Cycle 2) Administration: 900 mg (04/08/2019), 900 mg (05/02/2019) PACLitaxel (TAXOL) 492 mg in sodium chloride 0.9 % 500 mL chemo infusion (> 102m/m2), 200 mg/m2 = 492 mg, Intravenous,  Once, 2 of 4 cycles Administration: 492 mg (04/08/2019), 492 mg (05/02/2019) pembrolizumab (KEYTRUDA) 200 mg in sodium chloride 0.9 % 50 mL chemo infusion, 200 mg, Intravenous, Once, 2 of 5 cycles Administration: 200 mg (04/08/2019), 200 mg (05/02/2019) fosaprepitant (EMEND) 150 mg, dexamethasone (DECADRON) 12 mg in sodium chloride 0.9 % 145 mL IVPB, , Intravenous,  Once, 2 of 4 cycles Administration:  (04/08/2019),  (05/02/2019)  for chemotherapy treatment.        INTERVAL HISTORY:  Ms. [id number]. female presents today for follow-up of Doppler study of left upper extremity.  She completed cycle 2 of  carbotaxol pembrolizumab yesterday.  She does report fatigue today.  She presented with left upper extremity swelling yesterday there was concern for possible thrombus.  She is here to discuss results of Doppler study.   REVIEW OF SYSTEMS:  Review of Systems  Constitutional: Positive for fatigue.  HENT:  Negative.   Eyes: Negative.   Respiratory: Negative.   Cardiovascular: Negative.   Gastrointestinal: Negative.   Endocrine: Negative.   Genitourinary: Negative.    Musculoskeletal: Positive for back pain.  Skin: Negative.   Neurological: Positive for extremity weakness.  Hematological: Negative.   Psychiatric/Behavioral: Negative.      PAST MEDICAL/SURGICAL HISTORY:  Past Medical History:  Diagnosis Date  . Depression   . Glucose intolerance 03/11/2019  . Obesity, Class III, BMI 40-49.9 (morbid obesity) ([facility] 03/11/2019   Past Surgical History:  Procedure Laterality Date  . CESAREAN SECTION    . IRRIGATION AND DEBRIDEMENT ABSCESS  03/15/2019   RETROPHARYNGEAL   . TONSILLECTOMY AND ADENOIDECTOMY N/A 03/15/2019   Procedure: IRRIGATION AND DEBRIDEMENT OF RETROPHARYNGEAL ABSCESS;  Surgeon: TLeta Baptist MD;  [facility];  Service: ENT;  Laterality: N/A;     SOCIAL HISTORY:  Social History   Socioeconomic History  . Marital status: Widowed    Spouse name: Not on file  . Number of children: 1  . Years of education: Not on file  . Highest education level: Not on file  Occupational History  . Not on file  Social Needs  . Financial resource strain: Not very hard  . Food insecurity    Worry: Never  true    Inability: Never true  . Transportation needs    Medical: No    Non-medical: No  Tobacco Use  . Smoking status: Current Every Day Smoker    Packs/day: 1.00    Years: 25.00    Pack years: 25.00    Types: Cigarettes  . Smokeless tobacco: Never Used  Substance and Sexual Activity  . Alcohol use: Not Currently    Comment: social   . Drug use: Not Currently  .  Sexual activity: Not on file  Lifestyle  . Physical activity    Days per week: 0 days    Minutes per session: 0 min  . Stress: Only a little  Relationships  . Social connections    Talks on phone: More than three times a week    Gets together: Twice a week    Attends religious service: Never    Active member of club or organization: No    Attends meetings of clubs or organizations: Never    Relationship status: Widowed  . Intimate partner violence    Fear of current or ex partner: No    Emotionally abused: No    Physically abused: No    Forced sexual activity: No  Other Topics Concern  . Not on file  Social History Narrative   03/07/2019   Husband passed away four months ago from Cancer of the [place].   Pt. Has 12 yo son at home.    FAMILY HISTORY:  Family History  Problem Relation Age of Onset  . Lung cancer Mother   . Hypertension Mother   . Leukemia Father   . Myelodysplastic syndrome Father     CURRENT MEDICATIONS:  Outpatient Encounter Medications as of 05/03/2019  Medication Sig  . buPROPion (WELLBUTRIN XL) 150 MG 24 hr tablet Take 150 mg by mouth daily.  . CARBOPLATIN IV Inject into the vein every 21 ( twenty-one) days. X 4 cycles  . furosemide (LASIX) 20 MG tablet Take 1 tablet (20 mg total) by mouth once as needed for up to 1 dose (Take 20mg once daily as needed).  . ibuprofen (ADVIL) 400 MG tablet Take 800 mg by mouth every 6 (six) hours as needed (pain).   . nystatin (MYCOSTATIN/NYSTOP) powder Apply topically 4 (four) times daily.  . oxyCODONE (OXY IR/ROXICODONE) 5 MG immediate release tablet Take 1 tablet (5 mg total) by mouth every 4 (four) hours as needed for severe pain.  . PACLitaxel (TAXOL IV) Inject into the vein every 21 ( twenty-one) days. X 4 cycles  . PEMBROLIZUMAB IV Inject into the vein every 21 ( twenty-one) days.  . prochlorperazine (COMPAZINE) 10 MG tablet Take 1 tablet (10 mg total) by mouth every 6 (six) hours as needed (Nausea or vomiting).   . [DISCONTINUED] nystatin ointment (MYCOSTATIN) Apply 1 application topically 3 (three) times daily.  . Eliquis DVT/PE Starter Pack (ELIQUIS STARTER PACK) 5 MG TABS Take as directed on package: start with two-5mg tablets twice daily for 7 days. On day 8, switch to one-5mg tablet twice daily.   No facility-administered encounter medications on file as of 05/03/2019.     ALLERGIES:  No Known Allergies   PHYSICAL EXAM:  ECOG Performance status: 2  Vitals:   05/03/19 1218  BP: 129/90  Pulse: 99  Resp: 18  Temp: 97.9 F (36.6 C)  SpO2: 96%   There were no vitals filed for this visit.  Physical Exam Constitutional:      Appearance: She is obese.       Comments: Facial edema   HENT:     Nose: Nose normal.     Mouth/Throat:     Mouth: Mucous membranes are moist.     Pharynx: Oropharynx is clear.  Eyes:     Extraocular Movements: Extraocular movements intact.     Conjunctiva/sclera: Conjunctivae normal.  Neck:     Musculoskeletal: Normal range of motion.  Cardiovascular:     Rate and Rhythm: Normal rate and regular rhythm.     Pulses: Normal pulses.     Heart sounds: Normal heart sounds.  Pulmonary:     Effort: Pulmonary effort is normal.     Breath sounds: Normal breath sounds.  Abdominal:     General: Bowel sounds are normal.  Musculoskeletal: Normal range of motion.  Skin:    General: Skin is warm and dry.  Neurological:     General: No focal deficit present.     Mental Status: She is alert and oriented to person, place, and time.  Psychiatric:        Mood and Affect: Mood normal.        Behavior: Behavior normal.        Thought Content: Thought content normal.        Judgment: Judgment normal.      LABORATORY DATA:  I have reviewed the labs as listed.  CBC    Component Value Date/Time   WBC 7.1 05/02/2019 0918   RBC 4.04 05/02/2019 0918   HGB 11.7 (L) 05/02/2019 0918   HCT 37.1 05/02/2019 0918   PLT 327 05/02/2019 0918   MCV 91.8 05/02/2019 0918   MCH  29.0 05/02/2019 0918   MCHC 31.5 05/02/2019 0918   RDW 16.7 (H) 05/02/2019 0918   LYMPHSABS 0.7 05/02/2019 0918   MONOABS 0.6 05/02/2019 0918   EOSABS 0.1 05/02/2019 0918   BASOSABS 0.1 05/02/2019 0918   CMP Latest Ref Rng & Units 05/02/2019 04/06/2019 04/01/2019  Glucose 70 - 99 mg/dL 111(H) 115(H) 119(H)  BUN 6 - 20 mg/dL _0 Creatinine 0.44 - 1.00 mg/dL 0.97 1.03(H) 0.99  Sodium 135 - 145 mmol/L 137 137 138  Potassium 3.5 - 5.1 mmol/L 3.6 3.5 3.8  Chloride 98 - 111 mmol/L 101 95(L) 102  CO2 22 - 32 mmol/L _1 Calcium 8.9 - 10.3 mg/dL 9.2 9.1 9.1  Total Protein 6.5 - 8.1 g/dL 7.3 7.3 -  Total Bilirubin 0.3 - 1.2 mg/dL 0.5 0.8 -  Alkaline Phos 38 - 126 U/L 65 80 -  AST 15 - 41 U/L 26 18 -  ALT 0 - 44 U/L 37 31 -      ASSESSMENT & PLAN:   Squamous cell lung cancer, unspecified laterality ([place]) 1.  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  Patient is a current active smoker. - 03/06/2019: CT neck: Revealed a 2.4 cm retropharyngeal fluid collection at C3 level and mild mass effect on the posterior oropharynx. No peritonsilar abscess was noted. She had cervical adenopathy including a level IIa node measuring 1.6 cm on the right and 1.4 cm on the left. There was a right sided 6.1 x 4.8 cm nodal conglomerate at level IV. There was also partially visualized adenopathy measuring 6.4 x 4.1 cm in the right paratracheal region.  - 03/14/2019: Biopsy of her right cervical adenopathy. Pathology suggest non-small cell cancer, squamous cell. Cells are positive for cytokeratin 7, cytokeratin 5/6, MOC-31 and p63 but negative for  cytokeratin 20, mucicarmine, S100, Pax-8, and GATA-3. P16 negative -Completed palliative XRT on 03/31/2019. - PET CT scan on 04/04/2019 shows markedly hypermetabolic nodal disease in the right supraclavicular region, right thoracic inlet/high right paratracheal station.  Lymphadenopathy in the right  neck and both axilla show low-level FDG accumulation.  Markedly hypermetabolic bilateral adrenal nodules concerning for metastasis. -Even though there is no clear lung primary, this is mostly consistent with metastatic lung cancer. -MRI of the brain is negative. -Cycle 1 of carboplatin, Taxol and pembrolizumab on 04/08/2019.  Mild nausea with no vomiting. -Overall swelling in the upper trunk has diminished. -I have reviewed her blood work.  She may proceed with cycle 2 without any dose modifications.  -She will return to clinic in 3 weeks for cycle 3.  2. Thrombus: - Left upper extremity Doppler reveals a nonocclusive superficial thrombus around the PICC line in the left basilic vein. - Patient was started on Eliquis 10 mg twice daily x1 week then transition to 5 mg twice daily.  3.  Neck swelling: -She took Lasix 40 mg over the weekend. -Swelling has improved after first cycle.  4.  Right shoulder pain: -She takes oxycodone 5 mg as needed, mostly at bedtime.           Kim R Nester, FNP [facility] [phone]   

## 2019-05-03 NOTE — Assessment & Plan Note (Signed)
1.  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  Patient is a current active smoker. - 03/06/2019: CT neck: Revealed a 2.4 cm retropharyngeal fluid collection at C3 level and mild mass effect on the posterior oropharynx. No peritonsilar abscess was noted. She had cervical adenopathy including a level IIa node measuring 1.6 cm on the right and 1.4 cm on the left. There was a right sided 6.1 x 4.8 cm nodal conglomerate at level IV. There was also partially visualized adenopathy measuring 6.4 x 4.1 cm in the right paratracheal region.  - 03/14/2019: Biopsy of her right cervical adenopathy. Pathology suggest non-small cell cancer, squamous cell. Cells are positive for cytokeratin 7, cytokeratin 5/6, MOC-31 and p63 but negative for cytokeratin 20, mucicarmine, S100, Pax-8, and GATA-3. P16 negative -Completed palliative XRT on 03/31/2019. - PET CT scan on 04/04/2019 shows markedly hypermetabolic nodal disease in the right supraclavicular region, right thoracic inlet/high right paratracheal station.  Lymphadenopathy in the right neck and both axilla show low-level FDG accumulation.  Markedly hypermetabolic bilateral adrenal nodules concerning for metastasis. -Even though there is no clear lung primary, this is mostly consistent with metastatic lung cancer. -MRI of the brain is negative. -Cycle 1 of carboplatin, Taxol and pembrolizumab on 04/08/2019.  Mild nausea with no vomiting. -Overall swelling in the upper trunk has diminished. -I have reviewed her blood work.  She may proceed with cycle 2 without any dose modifications.  -She will return to clinic in 3 weeks for cycle 3.  2. Thrombus: - Left upper extremity Doppler reveals a nonocclusive superficial thrombus around the PICC line in the left basilic vein. - Patient was started on Eliquis 10 mg twice daily x1 week then transition to 5 mg twice daily.  3.  Neck swelling: -She took Lasix 40 mg  over the weekend. -Swelling has improved after first cycle.  4.  Right shoulder pain: -She takes oxycodone 5 mg as needed, mostly at bedtime.

## 2019-05-04 ENCOUNTER — Inpatient Hospital Stay (HOSPITAL_COMMUNITY): Payer: BC Managed Care – PPO

## 2019-05-04 ENCOUNTER — Other Ambulatory Visit: Payer: Self-pay

## 2019-05-04 ENCOUNTER — Encounter (HOSPITAL_COMMUNITY): Payer: Self-pay

## 2019-05-04 VITALS — BP 129/86 | HR 100 | Temp 97.5°F | Resp 18

## 2019-05-04 DIAGNOSIS — C3490 Malignant neoplasm of unspecified part of unspecified bronchus or lung: Secondary | ICD-10-CM | POA: Diagnosis not present

## 2019-05-04 MED ORDER — PEGFILGRASTIM INJECTION 6 MG/0.6ML ~~LOC~~
6.0000 mg | PREFILLED_SYRINGE | Freq: Once | SUBCUTANEOUS | Status: AC
  Administered 2019-05-04: 6 mg via SUBCUTANEOUS
  Filled 2019-05-04: qty 0.6

## 2019-05-04 NOTE — Progress Notes (Signed)
Patient tolerated injection with no complaints voiced.  Site clean and dry with no bruising or swelling noted at site.  Band aid applied.  Vss with discharge and left ambulatory with no s/s of distress noted.  

## 2019-05-06 ENCOUNTER — Other Ambulatory Visit: Payer: Self-pay

## 2019-05-06 ENCOUNTER — Inpatient Hospital Stay (HOSPITAL_COMMUNITY): Payer: BC Managed Care – PPO

## 2019-05-06 ENCOUNTER — Encounter (HOSPITAL_COMMUNITY): Payer: Self-pay

## 2019-05-06 VITALS — BP 128/79 | HR 102 | Temp 97.5°F | Resp 188

## 2019-05-06 DIAGNOSIS — C3490 Malignant neoplasm of unspecified part of unspecified bronchus or lung: Secondary | ICD-10-CM | POA: Diagnosis not present

## 2019-05-06 MED ORDER — HEPARIN SOD (PORK) LOCK FLUSH 100 UNIT/ML IV SOLN
500.0000 [IU] | Freq: Once | INTRAVENOUS | Status: AC
  Administered 2019-05-06: 500 [IU] via INTRAVENOUS

## 2019-05-06 MED ORDER — SODIUM CHLORIDE 0.9% FLUSH
10.0000 mL | Freq: Once | INTRAVENOUS | Status: AC
  Administered 2019-05-06: 10 mL via INTRAVENOUS

## 2019-05-06 NOTE — Progress Notes (Signed)
Picc line flushed per policy.  Good blood return noted with both lines and denied pain with flush.  Patient stated no new symptoms.  PRN arm discomfort.  No finger swelling noted and good capillary refill.  Reviewed when to call and go to the emergency room with understanding verbalized.  Dressing intact.  Vss with discharge and left by wheelchair.

## 2019-05-10 ENCOUNTER — Encounter (HOSPITAL_COMMUNITY): Payer: Self-pay

## 2019-05-10 ENCOUNTER — Inpatient Hospital Stay (HOSPITAL_COMMUNITY): Payer: BC Managed Care – PPO

## 2019-05-10 ENCOUNTER — Other Ambulatory Visit: Payer: Self-pay

## 2019-05-10 DIAGNOSIS — C3490 Malignant neoplasm of unspecified part of unspecified bronchus or lung: Secondary | ICD-10-CM | POA: Diagnosis not present

## 2019-05-10 MED ORDER — HEPARIN SOD (PORK) LOCK FLUSH 100 UNIT/ML IV SOLN
250.0000 [IU] | Freq: Once | INTRAVENOUS | Status: AC
  Administered 2019-05-10: 250 [IU] via INTRAVENOUS

## 2019-05-10 MED ORDER — SODIUM CHLORIDE 0.9% FLUSH
3.0000 mL | INTRAVENOUS | Status: DC | PRN
  Administered 2019-05-10: 3 mL via INTRAVENOUS
  Filled 2019-05-10: qty 10

## 2019-05-10 NOTE — Progress Notes (Signed)
Nani Skillern tolerated PICC line flush with dressing change well without complaints or incident.PICC line site clean and dry without redness or drainage noted. PICC line, both lumens, flushed easily per protocol with dressing and cap changed as well. VSS Pt discharged via wheelchair in satisfactory condition

## 2019-05-10 NOTE — Patient Instructions (Signed)
[place] at [facility] Discharge Instructions  PICC line flush with dressing change today. Follow-up as scheduled. Call clinic for any questions or concerns   Thank you for choosing [place] at [facility] to provide your oncology and hematology care.  To afford each patient quality time with our provider, please arrive at least 15 minutes before your scheduled appointment time.   If you have a lab appointment with the [place] [facility] please come in thru the  Main Entrance and check in at the main information desk  You need to re-schedule your appointment should you arrive 10 or more minutes late.  We strive to give you quality time with our providers, and arriving late affects you and other patients whose appointments are after yours.  Also, if you no show three or more times for appointments you may be dismissed from the clinic at the providers discretion.     Again, thank you for choosing [facility].  Our hope is that these requests will decrease the amount of time that you wait before being seen by our physicians.       _____________________________________________________________  Should you have questions after your visit to [facility], please contact our office at [phone] between the hours of 8:00 a.m. and 4:30 p.m.  Voicemails left after 4:00 p.m. will not be returned until the following business day.  For prescription refill requests, have your pharmacy contact our office and allow 72 hours.    Cancer Center Support Programs:   > Cancer Support Group  2nd Tuesday of the month 1pm-2pm, Journey Room

## 2019-05-13 ENCOUNTER — Inpatient Hospital Stay (HOSPITAL_COMMUNITY): Payer: BC Managed Care – PPO

## 2019-05-13 ENCOUNTER — Encounter (HOSPITAL_COMMUNITY): Payer: Self-pay

## 2019-05-13 ENCOUNTER — Other Ambulatory Visit: Payer: Self-pay

## 2019-05-13 DIAGNOSIS — C3490 Malignant neoplasm of unspecified part of unspecified bronchus or lung: Secondary | ICD-10-CM | POA: Diagnosis not present

## 2019-05-13 MED ORDER — SODIUM CHLORIDE 0.9% FLUSH
3.0000 mL | INTRAVENOUS | Status: DC | PRN
  Administered 2019-05-13: 3 mL via INTRAVENOUS
  Filled 2019-05-13: qty 10

## 2019-05-13 MED ORDER — HEPARIN SOD (PORK) LOCK FLUSH 100 UNIT/ML IV SOLN
250.0000 [IU] | Freq: Once | INTRAVENOUS | Status: AC
  Administered 2019-05-13: 250 [IU] via INTRAVENOUS

## 2019-05-13 NOTE — Progress Notes (Signed)
Tara Aguilar tolerated PICC line flush well without complaints or incident. PICC line,both lumens, flushed easily per protocol with dressing noted to be clean,dry and intact. VSS upon discharge. Pt discharged via wheelchair in satisfactory condtion

## 2019-05-13 NOTE — Patient Instructions (Signed)
[place] at [facility] Discharge Instructions  PICC line flushed per protocol. Follow-up as scheduled. Call clinic for any questions or concerns   Thank you for choosing [place] at [facility] to provide your oncology and hematology care.  To afford each patient quality time with our provider, please arrive at least 15 minutes before your scheduled appointment time.   If you have a lab appointment with the [place] please come in thru the  Main Entrance and check in at the main information desk  You need to re-schedule your appointment should you arrive 10 or more minutes late.  We strive to give you quality time with our providers, and arriving late affects you and other patients whose appointments are after yours.  Also, if you no show three or more times for appointments you may be dismissed from the clinic at the providers discretion.     Again, thank you for choosing [facility].  Our hope is that these requests will decrease the amount of time that you wait before being seen by our physicians.       _____________________________________________________________  Should you have questions after your visit to [facility], please contact our office at [phone] between the hours of 8:00 a.m. and 4:30 p.m.  Voicemails left after 4:00 p.m. will not be returned until the following business day.  For prescription refill requests, have your pharmacy contact our office and allow 72 hours.    Cancer Center Support Programs:   > Cancer Support Group  2nd Tuesday of the month 1pm-2pm, Journey Room

## 2019-05-16 ENCOUNTER — Encounter (HOSPITAL_COMMUNITY): Payer: Self-pay | Admitting: Hematology

## 2019-05-16 ENCOUNTER — Telehealth: Payer: Self-pay | Admitting: Radiation Oncology

## 2019-05-16 DIAGNOSIS — I871 Compression of vein: Secondary | ICD-10-CM

## 2019-05-16 DIAGNOSIS — C779 Secondary and unspecified malignant neoplasm of lymph node, unspecified: Secondary | ICD-10-CM

## 2019-05-16 NOTE — Telephone Encounter (Addendum)
  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  Interval Since Last Radiation:  7 weeks   03/17/19-03/31/19:  30 Gy in 10 fractions to the RUL target  Narrative:  The patient was contacted today for routine follow-up. During treatment she did very well with radiotherapy and did not have significant desquamation. She reports she has had continued fluctuation in her RUE. Initially following radiation this improved, but when she was dx with a picc line induced DVT, she states this worsened. She's been on Eliquis and had to increase lasix over the weekend with the hopes of improving her edema, but is still struggling with this and pain as a result.  Impression/Plan: 1. 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 The patient has been doing well since completion of radiotherapy. We discussed that we would be happy to continue to follow her as needed, but she will also continue to follow up with Dr. Worthy Keeler in medical oncology.  2. Right neck and upper extremity edema. Given the multifactorial issues, I think she should be evaluated by PT to see if it's an option to consider light compression for her RUE. She's anticoagulated and has had fluctuation in her symptoms. We appreciate PT's eval and reccomendations.     Carola Rhine, PAC

## 2019-05-17 ENCOUNTER — Inpatient Hospital Stay (HOSPITAL_COMMUNITY): Payer: BC Managed Care – PPO

## 2019-05-17 ENCOUNTER — Other Ambulatory Visit: Payer: Self-pay

## 2019-05-17 DIAGNOSIS — C3490 Malignant neoplasm of unspecified part of unspecified bronchus or lung: Secondary | ICD-10-CM | POA: Diagnosis not present

## 2019-05-17 MED ORDER — HEPARIN SOD (PORK) LOCK FLUSH 100 UNIT/ML IV SOLN
250.0000 [IU] | Freq: Once | INTRAVENOUS | Status: AC
  Administered 2019-05-17: 250 [IU] via INTRAVENOUS

## 2019-05-17 MED ORDER — SODIUM CHLORIDE 0.9% FLUSH
10.0000 mL | Freq: Once | INTRAVENOUS | Status: AC
  Administered 2019-05-17: 10 mL via INTRAVENOUS

## 2019-05-17 NOTE — Patient Instructions (Signed)
[place] at [facility]  Discharge Instructions:  PICC line flushed and dressing changed. _______________________________________________________________  Thank you for choosing [place] at [facility] to provide your oncology and hematology care.  To afford each patient quality time with our providers, please arrive at least 15 minutes before your scheduled appointment.  You need to re-schedule your appointment if you arrive 10 or more minutes late.  We strive to give you quality time with our providers, and arriving late affects you and other patients whose appointments are after yours.  Also, if you no show three or more times for appointments you may be dismissed from the clinic.  Again, thank you for choosing [place] at [place] hope is that these requests will allow you access to exceptional care and in a timely manner. _______________________________________________________________  If you have questions after your visit, please contact our office at [phone] between the hours of 8:30 a.m. and 5:00 p.m. Voicemails left after 4:30 p.m. will not be returned until the following business day. _______________________________________________________________  For prescription refill requests, have your pharmacy contact our office. _______________________________________________________________  Recommendations made by the consultant and any test results will be sent to your referring physician. _______________________________________________________________

## 2019-05-18 ENCOUNTER — Ambulatory Visit: Payer: BC Managed Care – PPO | Attending: Radiation Oncology | Admitting: Physical Therapy

## 2019-05-18 DIAGNOSIS — R293 Abnormal posture: Secondary | ICD-10-CM | POA: Diagnosis present

## 2019-05-18 DIAGNOSIS — I890 Lymphedema, not elsewhere classified: Secondary | ICD-10-CM

## 2019-05-18 DIAGNOSIS — M25611 Stiffness of right shoulder, not elsewhere classified: Secondary | ICD-10-CM

## 2019-05-18 NOTE — Therapy (Signed)
[place], [place], [address] Phone: [phone]   Fax:  [phone]  Physical Therapy Evaluation  Patient Details  Name: Tara Aguilar MRN: [id number] Date of Birth: July 29, 1981 Referring Provider (PT): Shona Simpson    Encounter Date: 05/18/2019  PT End of Session - 05/18/19 1414    Visit Number  1    Number of Visits  9    Date for PT Re-Evaluation  06/17/19    PT Start Time  1240    PT Stop Time  1315    PT Time Calculation (min)  35 min    Activity Tolerance  Patient tolerated treatment well    Behavior During Therapy  [facility] for tasks assessed/performed       Past Medical History:  Diagnosis Date  . Depression   . Glucose intolerance 03/11/2019  . Obesity, Class III, BMI 40-49.9 (morbid obesity) ([place]) 03/11/2019    Past Surgical History:  Procedure Laterality Date  . CESAREAN SECTION    . IRRIGATION AND DEBRIDEMENT ABSCESS  03/15/2019   RETROPHARYNGEAL   . TONSILLECTOMY AND ADENOIDECTOMY N/A 03/15/2019   Procedure: IRRIGATION AND DEBRIDEMENT OF RETROPHARYNGEAL ABSCESS;  Surgeon: Leta Baptist, MD;  Location: [facility];  Service: ENT;  Laterality: N/A;    There were no vitals filed for this visit.   Subjective Assessment - 05/18/19 1342    Subjective  I started with radiation because the lymph nodes were on my vena cava were causing problems. Pt reports that all her swelling is chest up. Her swelling had gotten better ( possibly with Eliquis?? and also had chemo this week)   and now it is getting worse since Saturday. She says that all her swelling is above her waist, in her chest and breasts , back and side as well as her neck.  She says her breasts feels hard and she doesn't often wear a bra because it is too uncomfortable.    Pertinent History  Symptoms began in May 2020 with a dental infection that resulted in 3 teeth extraction and another surgery for retropharygeal abcess.  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  She has a PICC line in left arm and had a superficial blood clot and had to start Eliquis.  She reports that her swelling had gotten better and now is worse.  She has lots of fatigue from chemo.    Currently in Pain?  No/denies         [facility] PT Assessment - 05/18/19 0001      Assessment   Medical Diagnosis  lung cancer     Referring Provider (PT)  Shona Simpson     Onset Date/Surgical Date  03/22/19    Hand Dominance  Right      Restrictions   Weight Bearing Restrictions  No      Balance Screen   Has the patient fallen in the past 6 months  No    Has the patient had a decrease in activity level because of a fear of falling?   No    Is the patient reluctant to leave their home because of a fear of falling?   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 Pt with PICC at left upper arm that is full with edema surrounding the site.      Skin Integrity  darkened skin at chest both breasts       Sensation   Light Touch  Not tested      Coordination   Gross Motor Movements are Fluid and Coordinated  Yes   pt moves slowly      Posture/Postural Control   Posture/Postural  Control  Postural limitations    Postural Limitations  Rounded Shoulders;Forward head    Posture Comments  pt obese with overhanging anterior abdomen and diffuse swelling in upper body       ROM / Strength   AROM / PROM / Strength  AROM      AROM   Overall AROM Comments  left shoulder not tested due to PICC line     AROM Assessment Site  Shoulder    Right/Left Shoulder  Right    Right Shoulder Extension  50 Degrees    Right Shoulder Flexion  120 Degrees    Right Shoulder ABduction  145 Degrees      Palpation   Palpation comment  pt has pitting edema in right hand and forearm. right breast is very firm. Did not fully assess left side        LYMPHEDEMA/ONCOLOGY QUESTIONNAIRE - 05/18/19 1404      Type   Cancer Type  lung cancer with SVC syndrome       Treatment   Active Chemotherapy Treatment  Yes    Past Radiation Treatment  Yes    Body Site  anterior chest       What other symptoms do you have   Are you Having Heaviness or Tightness  Yes    Are you having pitting edema  Yes    Body Site  forearms and hands, firmness in both breasts and anterior neck     Is it Hard or Difficult finding clothes that fit  Yes    Do you have infections  No    Stemmer Sign  Yes      Lymphedema Stage   Stage  STAGE 2 SPONTANEOUSLY IRREVERSIBLE      Right Upper Extremity Lymphedema   10 cm Proximal to Olecranon Process  41 cm    Olecranon Process  31 cm    15 cm Proximal to Ulnar Styloid Process  31 cm    Just Proximal to Ulnar Styloid Process  20 cm    Across Hand at [employer]  21.5 cm    At [place] of 2nd Digit  7.5 cm      Left Upper Extremity Lymphedema   10 cm Proximal to Olecranon Process  40 cm   right over PICC line   Olecranon Process  31.5 cm    15 cm Proximal to Ulnar Styloid Process  30.5 cm    Just Proximal to Ulnar Styloid Process  19 cm    Across Hand at [employer]  22 cm    At [place] of 2nd Digit  7 cm      Head and Neck   4 cm superior to sternal notch around  neck  51 cm    6 cm superior to sternal notch around neck  51.5 cm    8 cm superior to sternal notch around neck  52.3 cm                      Objective measurements completed on examination: See above findings.      [facility] Adult PT Treatment/Exercise - 05/18/19 0001      Self-Care   Self-Care  Other Self-Care Comments    Other Self-Care Comments   briefly explained MLD and began showing pt circles on inguinal nodes in staniding ( she said she had difficulty lying flat due to shortness of breath and she usually sleeps in a chair.  Tried to teach pt diaphragmatic breathing, but pt was unable to do this                   PT Long Term Goals - 05/18/19 1422      PT LONG TERM GOAL #1   Title  Pt will have decrease of circumference of neck at 8 cm proximal to sternal notch by 2 cm    Baseline  51 cm on 05/18/2019    Time  4    Period  Weeks    Status  New      PT LONG TERM GOAL #2   Title  Pt will be independent in diaphragmatic breathing and self MLD to help control lymphedema at home.    Time  4    Period  Weeks      PT LONG TERM GOAL #3   Title  Pt will report the discomfort she feels from swelling had decreased by 25%      PT LONG TERM GOAL #4   Title  Pt will be indepenedent in a HEP for remedial exercise of right shoulder and neck AROM    Time  4    Period  Weeks    Status  New             Plan - 05/18/19 1414    Clinical Impression Statement  Pt presents with swelling in  upper body including neck, both breasts, back and both arms. She appears to be fatigued from chemo and uncomfortable due to the swelling.  She would benefit from a trial of manual lymph drainage and kinesiotaping a I do not think she can tolerate compression at this time.  If she responds to treatment, she may benefit from a compression bra in a few weeks if she can tolerate it.  Pt lives in [place] and is having trouble with transportation. [facility] would be more convenient  for her and she would lke to get her treatment there.    Personal Factors and Comorbidities  Comorbidity 3+    Comorbidities  recent radiation, recent dental absess, recent radiation to chest, ongoing chemo    Examination-Activity Limitations  Dressing;Bathing    Stability/Clinical Decision Making  Evolving/Moderate complexity    Clinical Decision Making  Moderate    Rehab Potential  Good    PT Frequency  2x / week    PT Duration  4 weeks    PT Treatment/Interventions  ADLs/Self Care Home Management;DME Instruction;Therapeutic activities;Therapeutic exercise;Patient/family education;Orthotic Fit/Training;Manual techniques;Manual lymph drainage;Passive range of motion;Taping    PT Next Visit Plan  Manual lymph drainage , teach self MLD including diaphragmatic breathing, remedial exercise for neck ROM and right shoulder ROM, kinesiotape, compression when pt is ready    Consulted and Agree with Plan of Care  Patient       Patient will benefit from skilled therapeutic intervention in order to improve the following deficits and impairments:  Decreased skin integrity, Impaired perceived functional ability, Increased edema, Decreased range of motion, Decreased knowledge of precautions, Decreased activity tolerance, Decreased knowledge of use of DME, Decreased strength, Increased fascial restricitons, Impaired flexibility, Impaired UE functional use  Visit Diagnosis: 1. Lymphedema, not elsewhere classified   2. Abnormal posture   3. Stiffness of right shoulder, not elsewhere classified        Problem List Patient Active Problem List   Diagnosis Date Noted  . Squamous cell lung cancer, unspecified laterality ([place]) 04/05/2019  . Goals of care, counseling/discussion 04/05/2019  . 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 Glucose intolerance 03/11/2019  . Obesity, Class  III, BMI 40-49.9 (morbid obesity) ([place]) 03/11/2019  . Tobacco use 03/11/2019  . Dental abscess 03/07/2019  . 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 Depression    Donato Heinz. Owens Shark PT  Norwood Levo 05/18/2019, 2:29 PM  [place], [place], [address] Phone: [phone]   Fax:  [phone]  Name: Tara Aguilar MRN: [id number] Date of Birth: [birth year]

## 2019-05-20 ENCOUNTER — Encounter (HOSPITAL_COMMUNITY): Payer: BC Managed Care – PPO

## 2019-05-20 NOTE — Telephone Encounter (Signed)
Bryson Ha, I'm sorry I'm just seeing this. I'm on vacation but can call her Monday. Thanks, Inez Catalina

## 2019-05-23 ENCOUNTER — Other Ambulatory Visit (HOSPITAL_COMMUNITY): Payer: BC Managed Care – PPO

## 2019-05-23 ENCOUNTER — Inpatient Hospital Stay (HOSPITAL_BASED_OUTPATIENT_CLINIC_OR_DEPARTMENT_OTHER): Payer: BC Managed Care – PPO | Admitting: Hematology

## 2019-05-23 ENCOUNTER — Inpatient Hospital Stay (HOSPITAL_COMMUNITY): Payer: BC Managed Care – PPO

## 2019-05-23 ENCOUNTER — Encounter (HOSPITAL_COMMUNITY): Payer: Self-pay | Admitting: Hematology

## 2019-05-23 ENCOUNTER — Other Ambulatory Visit: Payer: Self-pay

## 2019-05-23 VITALS — BP 99/61 | HR 114 | Temp 97.5°F | Resp 18 | Wt 273.0 lb

## 2019-05-23 VITALS — BP 125/78 | HR 102 | Temp 97.7°F | Resp 18

## 2019-05-23 DIAGNOSIS — C3490 Malignant neoplasm of unspecified part of unspecified bronchus or lung: Secondary | ICD-10-CM | POA: Diagnosis not present

## 2019-05-23 DIAGNOSIS — F17210 Nicotine dependence, cigarettes, uncomplicated: Secondary | ICD-10-CM

## 2019-05-23 DIAGNOSIS — Z5111 Encounter for antineoplastic chemotherapy: Secondary | ICD-10-CM

## 2019-05-23 DIAGNOSIS — C7971 Secondary malignant neoplasm of right adrenal gland: Secondary | ICD-10-CM

## 2019-05-23 DIAGNOSIS — Z806 Family history of leukemia: Secondary | ICD-10-CM

## 2019-05-23 DIAGNOSIS — Z801 Family history of malignant neoplasm of trachea, bronchus and lung: Secondary | ICD-10-CM

## 2019-05-23 DIAGNOSIS — Z7901 Long term (current) use of anticoagulants: Secondary | ICD-10-CM

## 2019-05-23 DIAGNOSIS — Z79899 Other long term (current) drug therapy: Secondary | ICD-10-CM

## 2019-05-23 DIAGNOSIS — R531 Weakness: Secondary | ICD-10-CM

## 2019-05-23 DIAGNOSIS — M25511 Pain in right shoulder: Secondary | ICD-10-CM

## 2019-05-23 DIAGNOSIS — I82612 Acute embolism and thrombosis of superficial veins of left upper extremity: Secondary | ICD-10-CM

## 2019-05-23 DIAGNOSIS — R0602 Shortness of breath: Secondary | ICD-10-CM

## 2019-05-23 DIAGNOSIS — Z5112 Encounter for antineoplastic immunotherapy: Secondary | ICD-10-CM

## 2019-05-23 DIAGNOSIS — M7989 Other specified soft tissue disorders: Secondary | ICD-10-CM

## 2019-05-23 DIAGNOSIS — Z832 Family history of diseases of the blood and blood-forming organs and certain disorders involving the immune mechanism: Secondary | ICD-10-CM

## 2019-05-23 DIAGNOSIS — R5383 Other fatigue: Secondary | ICD-10-CM

## 2019-05-23 DIAGNOSIS — Z8249 Family history of ischemic heart disease and other diseases of the circulatory system: Secondary | ICD-10-CM

## 2019-05-23 DIAGNOSIS — R110 Nausea: Secondary | ICD-10-CM

## 2019-05-23 DIAGNOSIS — M549 Dorsalgia, unspecified: Secondary | ICD-10-CM

## 2019-05-23 DIAGNOSIS — R590 Localized enlarged lymph nodes: Secondary | ICD-10-CM

## 2019-05-23 LAB — COMPREHENSIVE METABOLIC PANEL
ALT: 39 U/L (ref 0–44)
AST: 29 U/L (ref 15–41)
Albumin: 3.5 g/dL (ref 3.5–5.0)
Alkaline Phosphatase: 70 U/L (ref 38–126)
Anion gap: 9 (ref 5–15)
BUN: 14 mg/dL (ref 6–20)
CO2: 26 mmol/L (ref 22–32)
Calcium: 9.2 mg/dL (ref 8.9–10.3)
Chloride: 101 mmol/L (ref 98–111)
Creatinine, Ser: 0.79 mg/dL (ref 0.44–1.00)
GFR calc Af Amer: >60 mL/min (ref >60)
GFR calc non Af Amer: >60 mL/min (ref >60)
Glucose, Bld: 101 mg/dL — ABNORMAL HIGH (ref 70–99)
Potassium: 3.3 mmol/L — ABNORMAL LOW (ref 3.5–5.1)
Sodium: 136 mmol/L (ref 135–145)
Total Bilirubin: 0.4 mg/dL (ref 0.3–1.2)
Total Protein: 7.1 g/dL (ref 6.5–8.1)

## 2019-05-23 LAB — CBC WITH DIFFERENTIAL/PLATELET
Abs Immature Granulocytes: 0.03 10*3/uL (ref 0.00–0.07)
Basophils Absolute: 0 10*3/uL (ref 0.0–0.1)
Basophils Relative: 0 %
Eosinophils Absolute: 0 10*3/uL (ref 0.0–0.5)
Eosinophils Relative: 0 %
HCT: 37.8 % (ref 36.0–46.0)
Hemoglobin: 12.2 g/dL (ref 12.0–15.0)
Immature Granulocytes: 0 %
Lymphocytes Relative: 10 %
Lymphs Abs: 0.7 10*3/uL (ref 0.7–4.0)
MCH: 29.4 pg (ref 26.0–34.0)
MCHC: 32.3 g/dL (ref 30.0–36.0)
MCV: 91.1 fL (ref 80.0–100.0)
Monocytes Absolute: 0.7 10*3/uL (ref 0.1–1.0)
Monocytes Relative: 10 %
Neutro Abs: 5.5 10*3/uL (ref 1.7–7.7)
Neutrophils Relative %: 80 %
Platelets: 163 10*3/uL (ref 150–400)
RBC: 4.15 MIL/uL (ref 3.87–5.11)
RDW: 17 % — ABNORMAL HIGH (ref 11.5–15.5)
WBC: 7 10*3/uL (ref 4.0–10.5)
nRBC: 0 % (ref 0.0–0.2)

## 2019-05-23 MED ORDER — SODIUM CHLORIDE 0.9 % IV SOLN
200.0000 mg/m2 | Freq: Once | INTRAVENOUS | Status: AC
  Administered 2019-05-23: 492 mg via INTRAVENOUS
  Filled 2019-05-23: qty 82

## 2019-05-23 MED ORDER — SODIUM CHLORIDE 0.9% FLUSH
10.0000 mL | INTRAVENOUS | Status: DC | PRN
  Administered 2019-05-23: 3 mL
  Filled 2019-05-23: qty 10

## 2019-05-23 MED ORDER — SODIUM CHLORIDE 0.9 % IV SOLN
200.0000 mg | Freq: Once | INTRAVENOUS | Status: AC
  Administered 2019-05-23: 200 mg via INTRAVENOUS
  Filled 2019-05-23: qty 8

## 2019-05-23 MED ORDER — SODIUM CHLORIDE 0.9 % IV SOLN
Freq: Once | INTRAVENOUS | Status: AC
  Administered 2019-05-23: 10:00:00 via INTRAVENOUS

## 2019-05-23 MED ORDER — FAMOTIDINE IN NACL 20-0.9 MG/50ML-% IV SOLN
20.0000 mg | Freq: Once | INTRAVENOUS | Status: AC
  Administered 2019-05-23: 20 mg via INTRAVENOUS
  Filled 2019-05-23: qty 50

## 2019-05-23 MED ORDER — SODIUM CHLORIDE 0.9 % IV SOLN
900.0000 mg | Freq: Once | INTRAVENOUS | Status: AC
  Administered 2019-05-23: 900 mg via INTRAVENOUS
  Filled 2019-05-23: qty 90

## 2019-05-23 MED ORDER — HEPARIN SOD (PORK) LOCK FLUSH 100 UNIT/ML IV SOLN
500.0000 [IU] | Freq: Once | INTRAVENOUS | Status: AC | PRN
  Administered 2019-05-23: 500 [IU]

## 2019-05-23 MED ORDER — SODIUM CHLORIDE 0.9 % IV SOLN
Freq: Once | INTRAVENOUS | Status: AC
  Administered 2019-05-23: 11:00:00 via INTRAVENOUS
  Filled 2019-05-23: qty 5

## 2019-05-23 MED ORDER — PEGFILGRASTIM 6 MG/0.6ML ~~LOC~~ PSKT
6.0000 mg | PREFILLED_SYRINGE | Freq: Once | SUBCUTANEOUS | Status: DC
  Filled 2019-05-23: qty 0.6

## 2019-05-23 MED ORDER — DIPHENHYDRAMINE HCL 50 MG/ML IJ SOLN
50.0000 mg | Freq: Once | INTRAMUSCULAR | Status: AC
  Administered 2019-05-23: 50 mg via INTRAVENOUS
  Filled 2019-05-23: qty 1

## 2019-05-23 MED ORDER — PALONOSETRON HCL INJECTION 0.25 MG/5ML
0.2500 mg | Freq: Once | INTRAVENOUS | Status: AC
  Administered 2019-05-23: 0.25 mg via INTRAVENOUS
  Filled 2019-05-23: qty 5

## 2019-05-23 NOTE — Progress Notes (Signed)
[id number] Labs reviewed with and pt seen by Dr. Delton Coombes and pt approved for chemo tx today per MD                            Nani Skillern tolerated chemo tx well without complaints or incident. VSS upon discharge. Pt discharged via wheelchair in satisfactory condition

## 2019-05-23 NOTE — Assessment & Plan Note (Signed)
1.  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  Patient is a current active smoker. - 03/06/2019: CT neck: Revealed a 2.4 cm retropharyngeal fluid collection at C3 level and mild mass effect on the posterior oropharynx. No peritonsilar abscess was noted. She had cervical adenopathy including a level IIa node measuring 1.6 cm on the right and 1.4 cm on the left. There was a right sided 6.1 x 4.8 cm nodal conglomerate at level IV. There was also partially visualized adenopathy measuring 6.4 x 4.1 cm in the right paratracheal region.  - 03/14/2019: Biopsy of her right cervical adenopathy. Pathology suggest non-small cell cancer, squamous cell. Cells are positive for cytokeratin 7, cytokeratin 5/6, MOC-31 and p63 but negative for cytokeratin 20, mucicarmine, S100, Pax-8, and GATA-3. P16 negative -Completed palliative XRT on 03/31/2019. - PET CT scan on 04/04/2019 shows markedly hypermetabolic nodal disease in the right supraclavicular region, right thoracic inlet/high right paratracheal station.  Lymphadenopathy in the right neck and both axilla show low-level FDG accumulation.  Markedly hypermetabolic bilateral adrenal nodules concerning for metastasis. - Even though there is no clear lung primary, this is consistent with metastatic lung cancer. -2 cycles of carboplatin, paclitaxel and pembrolizumab on 04/08/2019 and 05/02/2019. -She has noticed improvement in the neck swelling and upper extremity swelling for 1 week after cycle 2.  Then the swelling started getting worse again.  She also experienced body pains lasting 3 to 4 days after each Neulasta injection. -She met with the physical therapy department and will start it next week. -We have reviewed her blood work today.  She may proceed with cycle 3 without any dose changes. -I plan to repeat a PET CT scan or CT neck, CAP prior to next visit.  2.  Left basilic vein thrombus: -Ultrasound on  05/03/2019 shows positive for nonocclusive thrombus around the PICC line in the left basilic vein, considered superficial thrombus.  No evidence of DVT. -Because of her malignancy, we have started her on full dose anticoagulation. -She is currently taking Eliquis.  3.  Right shoulder pain: -She takes oxycodone 5 mg as needed, mostly at bedtime.

## 2019-05-23 NOTE — Progress Notes (Signed)
Tara Aguilar, [place] [address]   CLINIC:  Medical Oncology/Hematology  PCP:  Glenda Chroman, MD [place] [address] [phone]   REASON FOR VISIT:  Follow-up for squamous cell lung cancer to the adrenals  CURRENT THERAPY: carboplatin, Taxol and pembrolizumab   BRIEF ONCOLOGIC HISTORY:  Oncology History  Squamous cell lung cancer, unspecified laterality ([place])  04/05/2019 Initial Diagnosis   Squamous cell lung cancer, unspecified laterality ([place])   04/08/2019 -  Chemotherapy   The patient had palonosetron (ALOXI) injection 0.25 mg, 0.25 mg, Intravenous,  Once, 3 of 4 cycles Administration: 0.25 mg (04/08/2019), 0.25 mg (05/02/2019), 0.25 mg (05/23/2019) pegfilgrastim (NEULASTA) injection 6 mg, 6 mg, Subcutaneous, Once, 2 of 2 cycles Administration: 6 mg (04/11/2019), 6 mg (05/04/2019) pegfilgrastim (NEULASTA ONPRO KIT) injection 6 mg, 6 mg, Subcutaneous, Once, 3 of 4 cycles CARBOplatin (PARAPLATIN) 900 mg in sodium chloride 0.9 % 500 mL chemo infusion, 900 mg (100 % of original dose 900 mg), Intravenous,  Once, 3 of 4 cycles Dose modification:   (original dose 900 mg, Cycle 1),   (original dose 900 mg, Cycle 2),   (original dose 900 mg, Cycle 3) Administration: 900 mg (04/08/2019), 900 mg (05/02/2019), 900 mg (05/23/2019) PACLitaxel (TAXOL) 492 mg in sodium chloride 0.9 % 500 mL chemo infusion (> '80mg'$ /m2), 200 mg/m2 = 492 mg, Intravenous,  Once, 3 of 4 cycles Administration: 492 mg (04/08/2019), 492 mg (05/02/2019), 492 mg (05/23/2019) pembrolizumab (KEYTRUDA) 200 mg in sodium chloride 0.9 % 50 mL chemo infusion, 200 mg, Intravenous, Once, 3 of 5 cycles Administration: 200 mg (04/08/2019), 200 mg (05/02/2019), 200 mg (05/23/2019) fosaprepitant (EMEND) 150 mg, dexamethasone (DECADRON) 12 mg in sodium chloride 0.9 % 145 mL IVPB, , Intravenous,  Once, 3 of 4 cycles Administration:  (04/08/2019),  (05/02/2019),  (05/23/2019)  for chemotherapy treatment.         INTERVAL HISTORY:  Tara Aguilar 38 y.o. female seen for follow-up and cycle 3 of chemotherapy.  She has noticed that the neck swelling has come back in the last 7 to 10 days.  She is currently taking Lasix daily.  Appetite is 75%.  Energy levels are low.  Shortness of breath on exertion is stable.  She is continuing Eliquis without any bleeding issues.  She is taking oxycodone as needed for her pains.  Denies any fevers or chills.  Denies any ER visits or hospitalizations.  Denies any tingling or numbness in extremities.   REVIEW OF SYSTEMS:  Review of Systems  Constitutional: Negative for fatigue.  Eyes: Negative.   Respiratory: Positive for shortness of breath.   Endocrine: Negative.   Genitourinary: Negative.    Musculoskeletal: Negative for back pain.  Skin: Negative.   Hematological: Negative.   Psychiatric/Behavioral: Negative.   All other systems reviewed and are negative.    PAST MEDICAL/SURGICAL HISTORY:  Past Medical History:  Diagnosis Date  . Depression   . Glucose intolerance 03/11/2019  . Obesity, Class III, BMI 40-49.9 (morbid obesity) ([place]) 03/11/2019   Past Surgical History:  Procedure Laterality Date  . CESAREAN SECTION    . IRRIGATION AND DEBRIDEMENT ABSCESS  03/15/2019   RETROPHARYNGEAL   . TONSILLECTOMY AND ADENOIDECTOMY N/A 03/15/2019   Procedure: IRRIGATION AND DEBRIDEMENT OF RETROPHARYNGEAL ABSCESS;  Surgeon: Leta Baptist, MD;  Location: [facility];  Service: ENT;  Laterality: N/A;     SOCIAL HISTORY:  Social History   Socioeconomic History  . Marital status: Widowed  Spouse name: Not on file  . Number of children: 1  . Years of education: Not on file  . Highest education level: Not on file  Occupational History  . Not on file  Social Needs  . Financial resource strain: Not very hard  . Food insecurity    Worry: Never true    Inability: Never true  . Transportation needs    Medical: No    Non-medical: No  Tobacco Use  . Smoking status: Current  Every Day Smoker    Packs/day: 1.00    Years: 25.00    Pack years: 25.00    Types: Cigarettes  . Smokeless tobacco: Never Used  Substance and Sexual Activity  . Alcohol use: Not Currently    Comment: social   . Drug use: Not Currently  . Sexual activity: Not on file  Lifestyle  . Physical activity    Days per week: 0 days    Minutes per session: 0 min  . Stress: Only a little  Relationships  . Social connections    Talks on phone: More than three times a week    Gets together: Twice a week    Attends religious service: Never    Active member of club or organization: No    Attends meetings of clubs or organizations: Never    Relationship status: Widowed  . Intimate partner violence    Fear of current or ex partner: No    Emotionally abused: No    Physically abused: No    Forced sexual activity: No  Other Topics Concern  . Not on file  Social History Narrative   Mar 12, 2019   Husband passed away four months ago from Cancer of the [place].   Pt. Has 40 yo son at home.    FAMILY HISTORY:  Family History  Problem Relation Age of Onset  . Lung cancer Mother   . Hypertension Mother   . Leukemia Father   . Myelodysplastic syndrome Father     CURRENT MEDICATIONS:  Outpatient Encounter Medications as of 05/23/2019  Medication Sig  . buPROPion (WELLBUTRIN XL) 150 MG 24 hr tablet Take 150 mg by mouth daily.  [name] CARBOPLATIN IV Inject into the vein every 21 ( twenty-one) days. X 4 cycles  . Eliquis DVT/PE Starter Pack (ELIQUIS STARTER PACK) 5 MG TABS Take as directed on package: start with two-'5mg'$  tablets twice daily for 7 days. On day 8, switch to one-'5mg'$  tablet twice daily.  . furosemide (LASIX) 20 MG tablet Take 1 tablet (20 mg total) by mouth once as needed for up to 1 dose (Take '20mg'$  once daily as needed).  [name] ibuprofen (ADVIL) 400 MG tablet Take 800 mg by mouth every 6 (six) hours as needed (pain).   . nystatin (MYCOSTATIN/NYSTOP) powder Apply topically 4 (four) times daily.   [name] oxyCODONE (OXY IR/ROXICODONE) 5 MG immediate release tablet Take 1 tablet (5 mg total) by mouth every 4 (four) hours as needed for severe pain.  [name] PACLitaxel (TAXOL IV) Inject into the vein every 21 ( twenty-one) days. X 4 cycles  . PEMBROLIZUMAB IV Inject into the vein every 21 ( twenty-one) days.  . prochlorperazine (COMPAZINE) 10 MG tablet Take 1 tablet (10 mg total) by mouth every 6 (six) hours as needed (Nausea or vomiting).   No facility-administered encounter medications on file as of 05/23/2019.     ALLERGIES:  No Known Allergies   PHYSICAL EXAM:  ECOG Performance status: 2  Vitals:   05/23/19 0800  BP: 99/61  Pulse: (!) 114  Resp: 18  Temp: (!) 97.5 F (36.4 C)  SpO2: 97%   Filed Weights   05/23/19 0800  Weight: 273 lb (123.8 kg)    Physical Exam Constitutional:      Appearance: She is obese.     Comments: Facial edema   HENT:     Mouth/Throat:     Mouth: Mucous membranes are moist.     Pharynx: Oropharynx is clear.  Eyes:     Extraocular Movements: Extraocular movements intact.     Conjunctiva/sclera: Conjunctivae normal.  Neck:     Musculoskeletal: Normal range of motion.  Cardiovascular:     Rate and Rhythm: Normal rate and regular rhythm.     Heart sounds: Normal heart sounds.  Pulmonary:     Effort: Pulmonary effort is normal.     Breath sounds: Normal breath sounds.  Abdominal:     General: Bowel sounds are normal. There is no distension.     Palpations: There is no mass.  Musculoskeletal: Normal range of motion.  Skin:    General: Skin is warm.  Neurological:     General: No focal deficit present.     Mental Status: She is alert and oriented to person, place, and time.  Psychiatric:        Mood and Affect: Mood normal.        Behavior: Behavior normal.        Thought Content: Thought content normal.        Judgment: Judgment normal.      LABORATORY DATA:  I have reviewed the labs as listed.  CBC    Component Value Date/Time    WBC 7.0 05/23/2019 0834   RBC 4.15 05/23/2019 0834   HGB 12.2 05/23/2019 0834   HCT 37.8 05/23/2019 0834   PLT 163 05/23/2019 0834   MCV 91.1 05/23/2019 0834   MCH 29.4 05/23/2019 0834   MCHC 32.3 05/23/2019 0834   RDW 17.0 (H) 05/23/2019 0834   LYMPHSABS 0.7 05/23/2019 0834   MONOABS 0.7 05/23/2019 0834   EOSABS 0.0 05/23/2019 0834   BASOSABS 0.0 05/23/2019 0834   CMP Latest Ref Rng & Units 05/23/2019 05/02/2019 04/06/2019  Glucose 70 - 99 mg/dL 101(H) 111(H) 115(H)  BUN 6 - 20 mg/dL '14 12 15  '$ Creatinine 0.44 - 1.00 mg/dL 0.79 0.97 1.03(H)  Sodium 135 - 145 mmol/L 136 137 137  Potassium 3.5 - 5.1 mmol/L 3.3(L) 3.6 3.5  Chloride 98 - 111 mmol/L 101 101 95(L)  CO2 22 - 32 mmol/L '26 25 25  '$ Calcium 8.9 - 10.3 mg/dL 9.2 9.2 9.1  Total Protein 6.5 - 8.1 g/dL 7.1 7.3 7.3  Total Bilirubin 0.3 - 1.2 mg/dL 0.4 0.5 0.8  Alkaline Phos 38 - 126 U/L 70 65 80  AST 15 - 41 U/L '29 26 18  '$ ALT 0 - 44 U/L 39 37 31      ASSESSMENT & PLAN:   Squamous cell lung cancer, unspecified laterality ([place]) 1.  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  Patient is a current active smoker. - 03/06/2019: CT neck: Revealed a 2.4 cm retropharyngeal fluid collection at C3 level and mild mass effect on the posterior oropharynx. No peritonsilar abscess was noted. She had cervical adenopathy including a level IIa node measuring 1.6 cm on the right and 1.4 cm on the left. There was a right sided 6.1 x 4.8 cm nodal conglomerate at  level IV. There was also partially visualized adenopathy measuring 6.4 x 4.1 cm in the right paratracheal region.  - 03/14/2019: Biopsy of her right cervical adenopathy. Pathology suggest non-small cell cancer, squamous cell. Cells are positive for cytokeratin 7, cytokeratin 5/6, MOC-31 and p63 but negative for cytokeratin 20, mucicarmine, S100, Pax-8, and GATA-3. P16 negative -Completed palliative XRT on 03/31/2019. - PET CT  scan on 04/04/2019 shows markedly hypermetabolic nodal disease in the right supraclavicular region, right thoracic inlet/high right paratracheal station.  Lymphadenopathy in the right neck and both axilla show low-level FDG accumulation.  Markedly hypermetabolic bilateral adrenal nodules concerning for metastasis. - Even though there is no clear lung primary, this is consistent with metastatic lung cancer. -2 cycles of carboplatin, paclitaxel and pembrolizumab on 04/08/2019 and 05/02/2019. -She has noticed improvement in the neck swelling and upper extremity swelling for 1 week after cycle 2.  Then the swelling started getting worse again.  She also experienced body pains lasting 3 to 4 days after each Neulasta injection. -She met with the physical therapy department and will start it next week. -We have reviewed her blood work today.  She may proceed with cycle 3 without any dose changes. -I plan to repeat a PET CT scan or CT neck, CAP prior to next visit.  2.  Left basilic vein thrombus: -Ultrasound on 05/03/2019 shows positive for nonocclusive thrombus around the PICC line in the left basilic vein, considered superficial thrombus.  No evidence of DVT. -Because of her malignancy, we have started her on full dose anticoagulation. -She is currently taking Eliquis.  3.  Right shoulder pain: -She takes oxycodone 5 mg as needed, mostly at bedtime.       Total time spent is 25 minutes with more than 50% of the time spent face-to-face discussing treatment plan, counseling and coordination of care.    Derek Jack, MD [place] [phone]

## 2019-05-23 NOTE — Patient Instructions (Addendum)
[facility] Discharge Instructions for Patients Receiving Chemotherapy   Beginning January 23rd 2017 lab work for the [facility] will be done in the  Main lab at [facility] on 1st floor. If you have a lab appointment with the [place] please come in thru the  Main Entrance and check in at the main information desk   Today you received the following chemotherapy agents Keytruda,Taxol and Carboplatin. Follow-up as scheduled. Call clinic for any questions or concerns  To help prevent nausea and vomiting after your treatment, we encourage you to take your nausea medication   If you develop nausea and vomiting, or diarrhea that is not controlled by your medication, call the clinic.  The clinic phone number is [phone]. Office hours are Monday-Friday 8:30am-5:00pm.  BELOW ARE SYMPTOMS THAT SHOULD BE REPORTED IMMEDIATELY:  *FEVER GREATER THAN 101.0 F  *CHILLS WITH OR WITHOUT FEVER  NAUSEA AND VOMITING THAT IS NOT CONTROLLED WITH YOUR NAUSEA MEDICATION  *UNUSUAL SHORTNESS OF BREATH  *UNUSUAL BRUISING OR BLEEDING  TENDERNESS IN MOUTH AND THROAT WITH OR WITHOUT PRESENCE OF ULCERS  *URINARY PROBLEMS  *BOWEL PROBLEMS  UNUSUAL RASH Items with * indicate a potential emergency and should be followed up as soon as possible. If you have an emergency after office hours please contact your primary care physician or go to the nearest emergency department.  Please call the clinic during office hours if you have any questions or concerns.   You may also contact the Patient Navigator at [phone] should you have any questions or need assistance in obtaining follow up care.      Resources For Cancer Patients and their Caregivers ? American Cancer Society: Can assist with transportation, wigs, general needs, runs Look Good Feel Better.        8103580024 ? Cancer Care: Provides financial assistance, online support groups, medication/co-pay assistance.   1-800-813-HOPE (212) 394-7499) ? [facility] Assists [place] cancer patients and their families through emotional , educational and financial support.  516-270-6622 ? [place] DSS Where to apply for food stamps, Medicaid and utility assistance. 773-406-0904 ? [employer]: Transportation to medical appointments. 930-483-5727 ? Social Security Administration: May apply for disability if have a Stage IV cancer. 930 240 3946 984-020-4437 ? [employer], Disability and Transit Services: Assists with nutrition, care and transit needs. (215)038-6539

## 2019-05-23 NOTE — Patient Instructions (Signed)
[facility] Discharge Instructions  You were seen today by Dr. Katragadda. He went over your recent lab results. He will see you back in 3 weeks for labs and follow up.   Thank you for choosing [facility] at [facility] to provide your oncology and hematology care.  To afford each patient quality time with our provider, please arrive at least 15 minutes before your scheduled appointment time.   If you have a lab appointment with the Cancer Center please come in thru the  Main Entrance and check in at the main information desk  You need to re-schedule your appointment should you arrive 10 or more minutes late.  We strive to give you quality time with our providers, and arriving late affects you and other patients whose appointments are after yours.  Also, if you no show three or more times for appointments you may be dismissed from the clinic at the providers discretion.     Again, thank you for choosing [facility].  Our hope is that these requests will decrease the amount of time that you wait before being seen by our physicians.       _____________________________________________________________  Should you have questions after your visit to [facility], please contact our office at [phone] between the hours of 8:00 a.m. and 4:30 p.m.  Voicemails left after 4:00 p.m. will not be returned until the following business day.  For prescription refill requests, have your pharmacy contact our office and allow 72 hours.    Cancer Center Support Programs:   > Cancer Support Group  2nd Tuesday of the month 1pm-2pm, Journey Room    

## 2019-05-24 ENCOUNTER — Other Ambulatory Visit (HOSPITAL_COMMUNITY): Payer: Self-pay | Admitting: Hematology

## 2019-05-24 ENCOUNTER — Telehealth (HOSPITAL_COMMUNITY): Payer: Self-pay | Admitting: Internal Medicine

## 2019-05-24 NOTE — Telephone Encounter (Signed)
pt has chemo on this day and never gets out before 4... we rescheduled the appt for 8/19 per patient's request  05/24/19

## 2019-05-25 ENCOUNTER — Inpatient Hospital Stay (HOSPITAL_COMMUNITY): Payer: BC Managed Care – PPO

## 2019-05-25 ENCOUNTER — Other Ambulatory Visit: Payer: Self-pay

## 2019-05-25 ENCOUNTER — Encounter (HOSPITAL_COMMUNITY): Payer: Self-pay

## 2019-05-25 ENCOUNTER — Ambulatory Visit (HOSPITAL_COMMUNITY): Payer: BC Managed Care – PPO | Attending: Radiation Oncology | Admitting: Physical Therapy

## 2019-05-25 VITALS — BP 115/74 | HR 100 | Temp 98.4°F | Resp 18

## 2019-05-25 DIAGNOSIS — M25611 Stiffness of right shoulder, not elsewhere classified: Secondary | ICD-10-CM

## 2019-05-25 DIAGNOSIS — I890 Lymphedema, not elsewhere classified: Secondary | ICD-10-CM | POA: Diagnosis present

## 2019-05-25 DIAGNOSIS — C3490 Malignant neoplasm of unspecified part of unspecified bronchus or lung: Secondary | ICD-10-CM | POA: Diagnosis not present

## 2019-05-25 DIAGNOSIS — R293 Abnormal posture: Secondary | ICD-10-CM | POA: Diagnosis present

## 2019-05-25 MED ORDER — PEGFILGRASTIM-CBQV 6 MG/0.6ML ~~LOC~~ SOSY
6.0000 mg | PREFILLED_SYRINGE | Freq: Once | SUBCUTANEOUS | Status: AC
  Administered 2019-05-25: 6 mg via SUBCUTANEOUS
  Filled 2019-05-25: qty 0.6

## 2019-05-25 MED ORDER — HEPARIN SOD (PORK) LOCK FLUSH 100 UNIT/ML IV SOLN
500.0000 [IU] | Freq: Once | INTRAVENOUS | Status: AC
  Administered 2019-05-25: 500 [IU] via INTRAVENOUS

## 2019-05-25 MED ORDER — SODIUM CHLORIDE 0.9% FLUSH
10.0000 mL | Freq: Once | INTRAVENOUS | Status: AC
  Administered 2019-05-25: 10 mL via INTRAVENOUS

## 2019-05-25 NOTE — Progress Notes (Signed)
Picc line flushed per policy.   Good blood return noted and no complaints of pain with flush.  Insertion site clean and dry with no drainage or redness noted.  Patient tolerated injection with no complaints voiced.  Site clean and dry with no bruising or swelling noted at site.  Band aid applied.  Vss with discharge and left by wheelchair with no s/s of distress noted.

## 2019-05-25 NOTE — Therapy (Signed)
[place], [place], [address] Phone: [phone]   Fax:  [phone]  Physical Therapy Treatment  Patient Details  Name: Tara Aguilar MRN: [id number] Date of Birth: 1981-01-20 Referring Provider (PT): Shona Simpson    Encounter Date: 05/25/2019  PT End of Session - 05/25/19 1331    Visit Number  2    Number of Visits  9    Date for PT Re-Evaluation  06/17/19    PT Start Time  1106    PT Stop Time  1205    PT Time Calculation (min)  59 min    Activity Tolerance  Patient tolerated treatment well    Behavior During Therapy  [facility] for tasks assessed/performed       Past Medical History:  Diagnosis Date  . Depression   . Glucose intolerance 03/11/2019  . Obesity, Class III, BMI 40-49.9 (morbid obesity) ([place]) 03/11/2019    Past Surgical History:  Procedure Laterality Date  . CESAREAN SECTION    . IRRIGATION AND DEBRIDEMENT ABSCESS  03/15/2019   RETROPHARYNGEAL   . TONSILLECTOMY AND ADENOIDECTOMY N/A 03/15/2019   Procedure: IRRIGATION AND DEBRIDEMENT OF RETROPHARYNGEAL ABSCESS;  Surgeon: Leta Baptist, MD;  Location: Center;  Service: ENT;  Laterality: N/A;    There were no vitals filed for this visit.  Subjective Assessment - 05/25/19 1327    Subjective  pt states she is out of breath today; just worn out from chemo treatments.  Reports she is trying ot walk more.    Currently in Pain?  No/denies                       [facility] Adult PT Treatment/Exercise - 05/25/19 0001      Neck Exercises: Seated   Other Seated Exercise  cervical rotation, flexion, lateral flexion, extension all HEP instruction    Other Seated Exercise  shoulder flexion/abduction stretches for HEP      Manual Therapy   Manual Therapy  Manual Lymphatic Drainage (MLD)    Manual therapy comments  completed seperately from all other skilled interventions    Manual Lymphatic Drainage (MLD)  completed anterior only, semireclined to neck,  chest and bilateral UE's.  Axillary, inguinal, deep and superficial abdominal nodes stimulated.               PT Education - 05/25/19 1331    Education Details  POC and goals moving forward.  bilateral shoulder and Cervical AROM given for HEP.    Person(s) Educated  Patient    Methods  Explanation    Comprehension  Verbalized understanding          PT Long Term Goals - 05/18/19 1422      PT LONG TERM GOAL #1   Title  Pt will have decrease of circumference of neck at 8 cm proximal to sternal notch by 2 cm    Baseline  51 cm on 05/18/2019    Time  4    Period  Weeks    Status  New      PT LONG TERM GOAL #2   Title  Pt will be independent in diaphragmatic breathing and self MLD to help control lymphedema at home.    Time  4    Period  Weeks      PT LONG TERM GOAL #3   Title  Pt will report the discomfort she feels from swelling had decreased by 25%  PT LONG TERM GOAL #4   Title  Pt will be indepenedent in a HEP for remedial exercise of right shoulder and neck AROM    Time  4    Period  Weeks    Status  New            Plan - 05/25/19 1332    Clinical Impression Statement  reviewed POC moving forward and initiated AROM for shoulders/neck and manual lymph drainage.  Pt unble to tolerate supine, however able to complete reclined positioning to complete MLD to anterior aspect.  Pt with induration grossly distributed among chest, neck axillary and UE's.  Pt without any discomfort or issues with manual.  Instructed to complete AROM for neck and UE's 2X daily.    Personal Factors and Comorbidities  Comorbidity 3+    Comorbidities  recent radiation, recent dental absess, recent radiation to chest, ongoing chemo    Examination-Activity Limitations  Dressing;Bathing    Stability/Clinical Decision Making  Evolving/Moderate complexity    Rehab Potential  Good    PT Frequency  2x / week    PT Duration  4 weeks    PT Treatment/Interventions  ADLs/Self Care Home  Management;DME Instruction;Therapeutic activities;Therapeutic exercise;Patient/family education;Orthotic Fit/Training;Manual techniques;Manual lymph drainage;Passive range of motion;Taping    PT Next Visit Plan  continue with Manual lymph drainage.  Teach self MLD including diaphragmatic breathing, remedial exercise for neck ROM and right shoulder ROM, kinesiotape, compression when pt is ready    PT Home Exercise Plan  7/29:  AROM all motions cervical in seated, bil UE flexion/abduction AROM    Consulted and Agree with Plan of Care  Patient       Patient will benefit from skilled therapeutic intervention in order to improve the following deficits and impairments:  Decreased skin integrity, Impaired perceived functional ability, Increased edema, Decreased range of motion, Decreased knowledge of precautions, Decreased activity tolerance, Decreased knowledge of use of DME, Decreased strength, Increased fascial restricitons, Impaired flexibility, Impaired UE functional use  Visit Diagnosis: 1. Stiffness of right shoulder, not elsewhere classified   2. Lymphedema, not elsewhere classified   3. Abnormal posture        Problem List Patient Active Problem List   Diagnosis Date Noted  . Squamous cell lung cancer, unspecified laterality ([place]) 04/05/2019  . Goals of care, counseling/discussion 04/05/2019  . 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 Glucose intolerance 03/11/2019  . Obesity, Class III, BMI 40-49.9 (morbid obesity) ([place]) 03/11/2019  . Tobacco use 03/11/2019  . Dental abscess 03/07/2019  . 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 Depression    Teena Irani, PTA/CLT [phone]  Teena Irani 05/25/2019, 1:36 PM  [place] [address] [place], [place], [address] Phone:  [phone]   Fax:  [phone]  Name: Tara Aguilar MRN: [id number] Date of Birth: 12-01-1980

## 2019-05-26 ENCOUNTER — Other Ambulatory Visit (HOSPITAL_COMMUNITY): Payer: Self-pay | Admitting: Hematology

## 2019-05-26 MED ORDER — OXYCODONE HCL 5 MG PO TABS: 5.0000 mg | ORAL_TABLET | ORAL | 0 refills | Status: DC | PRN

## 2019-05-26 MED ORDER — ELIQUIS 5 MG VTE STARTER PACK: ORAL_TABLET | ORAL | 0 refills | Status: DC

## 2019-05-27 ENCOUNTER — Ambulatory Visit (HOSPITAL_COMMUNITY): Payer: BC Managed Care – PPO | Admitting: Physical Therapy

## 2019-05-27 NOTE — Telephone Encounter (Signed)
PT called re missed appointment.  PT states that she is feeling ill from the chemo and will be here next week.  Rayetta Humphrey, [place] CLT [phone]

## 2019-05-30 ENCOUNTER — Telehealth (HOSPITAL_COMMUNITY): Payer: Self-pay | Admitting: Hematology

## 2019-05-30 ENCOUNTER — Other Ambulatory Visit: Payer: Self-pay

## 2019-05-30 ENCOUNTER — Inpatient Hospital Stay (HOSPITAL_COMMUNITY): Payer: BC Managed Care – PPO | Attending: Hematology

## 2019-05-30 DIAGNOSIS — Z806 Family history of leukemia: Secondary | ICD-10-CM | POA: Insufficient documentation

## 2019-05-30 DIAGNOSIS — Z5189 Encounter for other specified aftercare: Secondary | ICD-10-CM | POA: Insufficient documentation

## 2019-05-30 DIAGNOSIS — Z86718 Personal history of other venous thrombosis and embolism: Secondary | ICD-10-CM | POA: Diagnosis not present

## 2019-05-30 DIAGNOSIS — M25511 Pain in right shoulder: Secondary | ICD-10-CM | POA: Diagnosis not present

## 2019-05-30 DIAGNOSIS — C7971 Secondary malignant neoplasm of right adrenal gland: Secondary | ICD-10-CM | POA: Insufficient documentation

## 2019-05-30 DIAGNOSIS — R0602 Shortness of breath: Secondary | ICD-10-CM | POA: Insufficient documentation

## 2019-05-30 DIAGNOSIS — Z5111 Encounter for antineoplastic chemotherapy: Secondary | ICD-10-CM | POA: Diagnosis present

## 2019-05-30 DIAGNOSIS — Z452 Encounter for adjustment and management of vascular access device: Secondary | ICD-10-CM | POA: Insufficient documentation

## 2019-05-30 DIAGNOSIS — R079 Chest pain, unspecified: Secondary | ICD-10-CM | POA: Insufficient documentation

## 2019-05-30 DIAGNOSIS — F17210 Nicotine dependence, cigarettes, uncomplicated: Secondary | ICD-10-CM | POA: Insufficient documentation

## 2019-05-30 DIAGNOSIS — Z5112 Encounter for antineoplastic immunotherapy: Secondary | ICD-10-CM | POA: Diagnosis present

## 2019-05-30 DIAGNOSIS — Z801 Family history of malignant neoplasm of trachea, bronchus and lung: Secondary | ICD-10-CM | POA: Insufficient documentation

## 2019-05-30 DIAGNOSIS — Z7901 Long term (current) use of anticoagulants: Secondary | ICD-10-CM | POA: Diagnosis not present

## 2019-05-30 DIAGNOSIS — R112 Nausea with vomiting, unspecified: Secondary | ICD-10-CM | POA: Diagnosis not present

## 2019-05-30 DIAGNOSIS — C3490 Malignant neoplasm of unspecified part of unspecified bronchus or lung: Secondary | ICD-10-CM | POA: Diagnosis not present

## 2019-05-30 MED ORDER — SODIUM CHLORIDE 0.9% FLUSH
10.0000 mL | Freq: Once | INTRAVENOUS | Status: AC
  Administered 2019-05-30: 10 mL

## 2019-05-30 MED ORDER — HEPARIN SOD (PORK) LOCK FLUSH 100 UNIT/ML IV SOLN
500.0000 [IU] | Freq: Once | INTRAVENOUS | Status: AC
  Administered 2019-05-30: 500 [IU] via INTRAVENOUS

## 2019-05-30 NOTE — Progress Notes (Signed)
Pt presents today for PICC flush and dressing change. VSS. Pt has no complaints of any changes since her last visit. MAR reviewed.    No complaints at this time. Discharged from clinic ambulatory. F/U with [facility] as scheduled.

## 2019-05-30 NOTE — Patient Instructions (Signed)
[facility]  Discharge Instructions:   _______________________________________________________________  Thank you for choosing [facility] at [facility] to provide your oncology and hematology care.  To afford each patient quality time with our providers, please arrive at least 15 minutes before your scheduled appointment.  You need to re-schedule your appointment if you arrive 10 or more minutes late.  We strive to give you quality time with our providers, and arriving late affects you and other patients whose appointments are after yours.  Also, if you no show three or more times for appointments you may be dismissed from the clinic.  Again, thank you for choosing [facility] at [facility]. Our hope is that these requests will allow you access to exceptional care and in a timely manner. _______________________________________________________________  If you have questions after your visit, please contact our office at [phone] between the hours of 8:30 a.m. and 5:00 p.m. Voicemails left after 4:30 p.m. will not be returned until the following business day. _______________________________________________________________  For prescription refill requests, have your pharmacy contact our office. _______________________________________________________________  Recommendations made by the consultant and any test results will be sent to your referring physician. _______________________________________________________________ 

## 2019-05-30 NOTE — Telephone Encounter (Signed)
PET DENIED BY [employer]. WILL GIVE DENIAL TO NP/RENEE TO REVIEW

## 2019-05-31 ENCOUNTER — Telehealth: Payer: Self-pay | Admitting: *Deleted

## 2019-05-31 ENCOUNTER — Other Ambulatory Visit (HOSPITAL_COMMUNITY): Payer: Self-pay | Admitting: Hematology

## 2019-05-31 DIAGNOSIS — C3490 Malignant neoplasm of unspecified part of unspecified bronchus or lung: Secondary | ICD-10-CM

## 2019-05-31 NOTE — Telephone Encounter (Signed)
On 05-31-19 faxed medical records to [facility] [employer]

## 2019-05-31 NOTE — Progress Notes (Signed)
  Radiation Oncology         [phone] ________________________________  Name: Tara Aguilar MRN: [id number]  Date: 03/31/2019  DOB: 10/15/1981  End of Treatment Note  Diagnosis:   38 y.o. 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 She is having more difficulty with her breathing and swallowing. Edema present in neck/upper extremities. She is taking Lasix 40mg  daily.  Plan: The patient has completed radiation treatment. The patient will return to radiation oncology clinic for routine followup in one month. I advised them to call or return sooner if they have any questions or concerns related to their recovery or treatment.  ------------------------------------------------  Jodelle Gross, MD, PhD  This document serves as a record of services personally performed by Kyung Rudd, MD. It was created on his behalf by Rae Lips, a trained medical scribe. The creation of this record is based on the scribe's personal observations and the provider's statements to them. This document has been checked and approved by the attending provider.

## 2019-06-01 ENCOUNTER — Encounter: Payer: Self-pay | Admitting: General Practice

## 2019-06-01 ENCOUNTER — Ambulatory Visit (HOSPITAL_COMMUNITY): Payer: BC Managed Care – PPO | Attending: Internal Medicine | Admitting: Physical Therapy

## 2019-06-01 ENCOUNTER — Encounter (HOSPITAL_COMMUNITY): Payer: Self-pay | Admitting: Physical Therapy

## 2019-06-01 ENCOUNTER — Other Ambulatory Visit: Payer: Self-pay

## 2019-06-01 DIAGNOSIS — I890 Lymphedema, not elsewhere classified: Secondary | ICD-10-CM | POA: Diagnosis present

## 2019-06-01 DIAGNOSIS — M25611 Stiffness of right shoulder, not elsewhere classified: Secondary | ICD-10-CM

## 2019-06-01 DIAGNOSIS — R293 Abnormal posture: Secondary | ICD-10-CM

## 2019-06-01 NOTE — Progress Notes (Signed)
[facility] CSW Progress Notes  CAll to patient to assess for needs and to discuss option of referral to [place] food/essential items distribution program.  Left VM w my contact information  Edwyna Shell, [place] Worker Phone:  [phone] Cell:  [phone]

## 2019-06-01 NOTE — Therapy (Signed)
[place], [place], [address] Phone: [phone]   Fax:  [phone]  Physical Therapy Treatment  Patient Details  Name: Tara Aguilar MRN: [id number] Date of Birth: 06/16/1981 Referring Provider (PT): Shona Simpson    Encounter Date: 06/01/2019  PT End of Session - 06/01/19 1215    Visit Number  3    Number of Visits  9    Date for PT Re-Evaluation  06/17/19    PT Start Time  1115    PT Stop Time  1205    PT Time Calculation (min)  50 min    Activity Tolerance  Patient tolerated treatment well    Behavior During Therapy  [facility] for tasks assessed/performed       Past Medical History:  Diagnosis Date  . Depression   . Glucose intolerance 03/11/2019  . Obesity, Class III, BMI 40-49.9 (morbid obesity) ([place]) 03/11/2019    Past Surgical History:  Procedure Laterality Date  . CESAREAN SECTION    . IRRIGATION AND DEBRIDEMENT ABSCESS  03/15/2019   RETROPHARYNGEAL   . TONSILLECTOMY AND ADENOIDECTOMY N/A 03/15/2019   Procedure: IRRIGATION AND DEBRIDEMENT OF RETROPHARYNGEAL ABSCESS;  Surgeon: Leta Baptist, MD;  Location: [place];  Service: ENT;  Laterality: N/A;    There were no vitals filed for this visit.  Subjective Assessment - 06/01/19 1117    Subjective  PT states that she is not feeling well due to the chemo.    Pertinent History  cancer    Patient Stated Goals  less swelling better abilty to move    Currently in Pain?  No/denies              [facility] Adult PT Treatment/Exercise - 06/01/19 0001      Self-Care   Self-Care  Other Self-Care Comments    Other Self-Care Comments   Explained that we would begin working on pt's chest and neck lymphedema first went over 1-8 steps on self massage and gave pt sheet.       Exercises   Exercises  --   shoulder flexion/abduction, cervical rom x 10      Manual Therapy   Manual Therapy  Manual Lymphatic Drainage (MLD)    Manual therapy comments  completed seperately  from all other skilled interventions    Manual Lymphatic Drainage (MLD)  completed anterior only, semireclined to include supraclavicular, deep and superfical abdominal followed by chest and neck                  PT Long Term Goals - 06/01/19 1218      PT LONG TERM GOAL #1   Title  Pt will have decrease of circumference of neck at 8 cm proximal to sternal notch by 2 cm    Baseline  51 cm on 05/18/2019    Time  4    Period  Weeks    Status  On-going      PT LONG TERM GOAL #2   Title  Pt will be independent in diaphragmatic breathing and self MLD to help control lymphedema at home.    Time  4    Period  Weeks    Status  On-going      PT LONG TERM GOAL #3   Title  Pt will report the discomfort she feels from swelling had decreased by 25%    Status  On-going      PT LONG TERM GOAL #4   Title  Pt  will be indepenedent in a HEP for remedial exercise of right shoulder and neck AROM    Time  4    Period  Weeks    Status  On-going            Plan - 06/01/19 1215    Clinical Impression Statement  Stressed the importance of completing ROM exercises.  Instructed pt in self massage for chest area completing 1-8.  Pt has significant induration in B breasts as well as in B axillary area.  She will need total decongestive techniques to include posterior aspect, however, today's treatment time did not allow for posterior aspect to be completed.    Personal Factors and Comorbidities  Comorbidity 3+    Comorbidities  recent radiation, recent dental absess, recent radiation to chest, ongoing chemo    Examination-Activity Limitations  Dressing;Bathing    Stability/Clinical Decision Making  Evolving/Moderate complexity    Rehab Potential  Good    PT Frequency  2x / week    PT Duration  4 weeks    PT Treatment/Interventions  ADLs/Self Care Home Management;DME Instruction;Therapeutic activities;Therapeutic exercise;Patient/family education;Orthotic Fit/Training;Manual techniques;Manual  lymph drainage;Passive range of motion;Taping    PT Next Visit Plan  Try neck compression, instruct in manual for cervical area.    PT Home Exercise Plan  7/29:  AROM all motions cervical in seated, bil UE flexion/abduction AROM    Consulted and Agree with Plan of Care  Patient       Patient will benefit from skilled therapeutic intervention in order to improve the following deficits and impairments:  Decreased skin integrity, Impaired perceived functional ability, Increased edema, Decreased range of motion, Decreased knowledge of precautions, Decreased activity tolerance, Decreased knowledge of use of DME, Decreased strength, Increased fascial restricitons, Impaired flexibility, Impaired UE functional use  Visit Diagnosis: 1. Stiffness of right shoulder, not elsewhere classified   2. Lymphedema, not elsewhere classified   3. Abnormal posture        Problem List Patient Active Problem List   Diagnosis Date Noted  . Squamous cell lung cancer, unspecified laterality ([place]) 04/05/2019  . Goals of care, counseling/discussion 04/05/2019  . 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 Glucose intolerance 03/11/2019  . Obesity, Class III, BMI 40-49.9 (morbid obesity) ([place]) 03/11/2019  . Tobacco use 03/11/2019  . Dental abscess 03/07/2019  . 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 Depression    Rayetta Humphrey, [place] CLT [phone] 06/01/2019, 12:21 PM  [place], [place], [address] Phone: [phone]   Fax:  [phone]  Name: Bonnell Placzek MRN: [id number] Date of Birth: 04/07/1981

## 2019-06-02 ENCOUNTER — Inpatient Hospital Stay (HOSPITAL_COMMUNITY): Payer: BC Managed Care – PPO

## 2019-06-02 ENCOUNTER — Telehealth (HOSPITAL_COMMUNITY): Payer: Self-pay | Admitting: Hematology

## 2019-06-02 DIAGNOSIS — Z452 Encounter for adjustment and management of vascular access device: Secondary | ICD-10-CM | POA: Diagnosis not present

## 2019-06-02 MED ORDER — HEPARIN SOD (PORK) LOCK FLUSH 100 UNIT/ML IV SOLN
500.0000 [IU] | Freq: Once | INTRAVENOUS | Status: AC
  Administered 2019-06-02: 500 [IU] via INTRAVENOUS

## 2019-06-02 MED ORDER — SODIUM CHLORIDE 0.9% FLUSH
20.0000 mL | Freq: Once | INTRAVENOUS | Status: AC
  Administered 2019-06-02: 14:00:00 20 mL via INTRAVENOUS

## 2019-06-02 NOTE — Telephone Encounter (Signed)
Provider changed scans to CT CAP AND NECK after PET was denied.  CT scans were approved by bcbs

## 2019-06-03 ENCOUNTER — Other Ambulatory Visit: Payer: Self-pay

## 2019-06-03 ENCOUNTER — Ambulatory Visit (HOSPITAL_COMMUNITY): Payer: BC Managed Care – PPO | Admitting: Physical Therapy

## 2019-06-03 ENCOUNTER — Encounter (HOSPITAL_COMMUNITY): Payer: Self-pay | Admitting: Physical Therapy

## 2019-06-03 DIAGNOSIS — R293 Abnormal posture: Secondary | ICD-10-CM

## 2019-06-03 DIAGNOSIS — M25611 Stiffness of right shoulder, not elsewhere classified: Secondary | ICD-10-CM | POA: Diagnosis not present

## 2019-06-03 DIAGNOSIS — I890 Lymphedema, not elsewhere classified: Secondary | ICD-10-CM

## 2019-06-03 NOTE — Therapy (Signed)
[facility] [address] [place], [place], [address] Phone: [phone]   Fax:  [phone]  Physical Therapy Treatment  Patient Details  Name: Tara Aguilar MRN: [id number] Date of Birth: 1981-09-17 Referring Provider (PT): Shona Simpson    Encounter Date: 06/03/2019  PT End of Session - 06/03/19 1218    Visit Number  4    Number of Visits  9    Date for PT Re-Evaluation  06/17/19    Authorization - Visit Number  4    Authorization - Number of Visits  9    PT Start Time  3846    PT Stop Time  1145    PT Time Calculation (min)  90 min    Activity Tolerance  Patient tolerated treatment well    Behavior During Therapy  [facility] for tasks assessed/performed       Past Medical History:  Diagnosis Date  . Depression   . Glucose intolerance 03/11/2019  . Obesity, Class III, BMI 40-49.9 (morbid obesity) ([place]) 03/11/2019    Past Surgical History:  Procedure Laterality Date  . CESAREAN SECTION    . IRRIGATION AND DEBRIDEMENT ABSCESS  03/15/2019   RETROPHARYNGEAL   . TONSILLECTOMY AND ADENOIDECTOMY N/A 03/15/2019   Procedure: IRRIGATION AND DEBRIDEMENT OF RETROPHARYNGEAL ABSCESS;  Surgeon: Leta Baptist, MD;  Location: [facility];  Service: ENT;  Laterality: N/A;    There were no vitals filed for this visit.  Subjective Assessment - 06/03/19 1214    Subjective  PT states that she has not felt much diffenence since starting therapy at this time. But states the first treatment was evaluation only, the second was more exercises so she feels that she has only had one treatment to date.    Pertinent History  cancer    Patient Stated Goals  less swelling better abilty to move    Currently in Pain?  No/denies          [facility] Adult PT Treatment/Exercise - 06/03/19 0001      Manual Therapy   Manual Therapy  Manual Lymphatic Drainage (MLD)    Manual therapy comments  completed seperately from all other skilled interventions    Manual Lymphatic Drainage  (MLD)  To include chest and neck, supraclavicular, deep and superfical abdominal, and B UE both anterior and posteriorly routing fluid using axillary-inguinal anastomosis.                   PT Long Term Goals - 06/03/19 1225      PT LONG TERM GOAL #1   Title  Pt will have decrease of circumference of neck at 8 cm proximal to sternal notch by 2 cm    Baseline  51 cm on 05/18/2019    Time  4    Period  Weeks    Status  On-going      PT LONG TERM GOAL #2   Title  Pt will be independent in diaphragmatic breathing and self MLD to help control lymphedema at home.    Time  4    Period  Weeks    Status  On-going      PT LONG TERM GOAL #3   Title  Pt will report the discomfort she feels from swelling had decreased by 25%    Status  On-going      PT LONG TERM GOAL #4   Title  Pt will be indepenedent in a HEP for remedial exercise of right shoulder and neck AROM  Time  4    Period  Weeks    Status  On-going            Plan - 06/03/19 1224    Clinical Impression Statement  Pt with significant induration in B breasts and subaxillary area.  Pt instructed in cervical self massaging technique and requested to complete trunk and cervical self massage over the weekend.  Compression for cervical area was made with instructions to attempt to wear compression for at least 4 hours a day.  Therapist also encourged pt to by spandex ware for chest and abdominal area.    Personal Factors and Comorbidities  Comorbidity 3+    Comorbidities  recent radiation, recent dental absess, recent radiation to chest, ongoing chemo    Examination-Activity Limitations  Dressing;Bathing    Stability/Clinical Decision Making  Evolving/Moderate complexity    Rehab Potential  Good    PT Frequency  2x / week    PT Duration  4 weeks    PT Treatment/Interventions  ADLs/Self Care Home Management;DME Instruction;Therapeutic activities;Therapeutic exercise;Patient/family education;Orthotic Fit/Training;Manual  techniques;Manual lymph drainage;Passive range of motion;Taping    PT Next Visit Plan  Re measure, check on order for compression bra.    PT Home Exercise Plan  7/29:  AROM all motions cervical in seated, bil UE flexion/abduction AROM    Consulted and Agree with Plan of Care  Patient       Patient will benefit from skilled therapeutic intervention in order to improve the following deficits and impairments:  Decreased skin integrity, Impaired perceived functional ability, Increased edema, Decreased range of motion, Decreased knowledge of precautions, Decreased activity tolerance, Decreased knowledge of use of DME, Decreased strength, Increased fascial restricitons, Impaired flexibility, Impaired UE functional use  Visit Diagnosis: 1. Stiffness of right shoulder, not elsewhere classified   2. Lymphedema, not elsewhere classified   3. Abnormal posture        Problem List Patient Active Problem List   Diagnosis Date Noted  . Squamous cell lung cancer, unspecified laterality ([place]) 04/05/2019  . Goals of care, counseling/discussion 04/05/2019  . 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 Glucose intolerance 03/11/2019  . Obesity, Class III, BMI 40-49.9 (morbid obesity) ([place]) 03/11/2019  . Tobacco use 03/11/2019  . Dental abscess 03/07/2019  . 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 Depression   Rayetta Humphrey, [place] CLT [phone] 06/03/2019, 12:28 PM  [facility] [address] [place], [place], [address] Phone: [phone]   Fax:  [phone]  Name: Tara Aguilar MRN: [id number] Date of Birth: [birth year]

## 2019-06-06 ENCOUNTER — Inpatient Hospital Stay (HOSPITAL_COMMUNITY): Payer: BC Managed Care – PPO

## 2019-06-06 ENCOUNTER — Other Ambulatory Visit (HOSPITAL_COMMUNITY): Payer: Self-pay | Admitting: *Deleted

## 2019-06-06 ENCOUNTER — Encounter (HOSPITAL_COMMUNITY): Payer: Self-pay | Admitting: Physical Therapy

## 2019-06-06 ENCOUNTER — Ambulatory Visit (HOSPITAL_COMMUNITY): Payer: BC Managed Care – PPO | Admitting: Physical Therapy

## 2019-06-06 ENCOUNTER — Other Ambulatory Visit: Payer: Self-pay

## 2019-06-06 ENCOUNTER — Ambulatory Visit (HOSPITAL_COMMUNITY)
Admission: RE | Admit: 2019-06-06 | Discharge: 2019-06-06 | Disposition: A | Discharge status: Home / Self Care | Payer: BC Managed Care – PPO | Source: Ambulatory Visit | Attending: Hematology | Admitting: Hematology

## 2019-06-06 ENCOUNTER — Ambulatory Visit (HOSPITAL_COMMUNITY): Payer: BC Managed Care – PPO

## 2019-06-06 ENCOUNTER — Encounter (HOSPITAL_COMMUNITY): Payer: Self-pay

## 2019-06-06 DIAGNOSIS — C3490 Malignant neoplasm of unspecified part of unspecified bronchus or lung: Secondary | ICD-10-CM

## 2019-06-06 DIAGNOSIS — M25611 Stiffness of right shoulder, not elsewhere classified: Secondary | ICD-10-CM | POA: Diagnosis not present

## 2019-06-06 DIAGNOSIS — R293 Abnormal posture: Secondary | ICD-10-CM

## 2019-06-06 DIAGNOSIS — Z452 Encounter for adjustment and management of vascular access device: Secondary | ICD-10-CM | POA: Diagnosis not present

## 2019-06-06 DIAGNOSIS — I890 Lymphedema, not elsewhere classified: Secondary | ICD-10-CM

## 2019-06-06 MED ORDER — SODIUM CHLORIDE 0.9 % IV SOLN
INTRAVENOUS | Status: DC
  Administered 2019-06-06: 16:00:00 via INTRAVENOUS

## 2019-06-06 MED ORDER — HEPARIN SOD (PORK) LOCK FLUSH 100 UNIT/ML IV SOLN
500.0000 [IU] | Freq: Once | INTRAVENOUS | Status: AC
  Administered 2019-06-06: 500 [IU] via INTRAVENOUS

## 2019-06-06 MED ORDER — IOHEXOL 300 MG/ML  SOLN
150.0000 mL | Freq: Once | INTRAMUSCULAR | Status: AC | PRN
  Administered 2019-06-06: 150 mL via INTRAVENOUS

## 2019-06-06 MED ORDER — ONDANSETRON HCL 4 MG/2ML IJ SOLN
INTRAMUSCULAR | Status: AC
  Filled 2019-06-06: qty 4

## 2019-06-06 MED ORDER — ONDANSETRON HCL 4 MG/2ML IJ SOLN
8.0000 mg | Freq: Once | INTRAMUSCULAR | Status: AC
  Administered 2019-06-06: 8 mg via INTRAVENOUS

## 2019-06-06 MED ORDER — SODIUM CHLORIDE 0.9% FLUSH: 10.0000 mL | Freq: Once | INTRAVENOUS | Status: DC

## 2019-06-06 NOTE — Progress Notes (Signed)
Change in order from w contrast CT abdomen to with and without contrast.  Prior authorization team notified and okay to proceed with change.

## 2019-06-06 NOTE — Progress Notes (Signed)
Nausea meds given per orders. PICC line dressing change done per protocol.   Nani Skillern presented for PICC line flush. PICC line located left arm . Good blood return present. PICC line flushed-both lumens with 57ml NS and 300U/62ml Heparin. Procedure without incident. Patient tolerated procedure well.Patient tolerated it well without problems. Vitals stable and discharged home from clinic via wheelchair. Follow up as scheduled.  Patient stated she felt better upon discharge.

## 2019-06-06 NOTE — Therapy (Signed)
[facility] [place], [place], [address] Phone: [phone]   Fax:  [phone]  Physical Therapy Treatment  Patient Details  Name: Tara Aguilar MRN: [id number] Date of Birth: 03/23/1981 Referring Provider (PT): Shona Simpson    Encounter Date: 06/06/2019  PT End of Session - 06/06/19 1516    Visit Number  5    Number of Visits  9    Date for PT Re-Evaluation  06/17/19    Authorization - Visit Number  5    Authorization - Number of Visits  9    PT Start Time  9030    PT Stop Time  1503    PT Time Calculation (min)  95 min    Activity Tolerance  Patient tolerated treatment well    Behavior During Therapy  [facility] for tasks assessed/performed       Past Medical History:  Diagnosis Date  . Depression   . Glucose intolerance 03/11/2019  . Obesity, Class III, BMI 40-49.9 (morbid obesity) ([place]) 03/11/2019    Past Surgical History:  Procedure Laterality Date  . CESAREAN SECTION    . IRRIGATION AND DEBRIDEMENT ABSCESS  03/15/2019   RETROPHARYNGEAL   . TONSILLECTOMY AND ADENOIDECTOMY N/A 03/15/2019   Procedure: IRRIGATION AND DEBRIDEMENT OF RETROPHARYNGEAL ABSCESS;  Surgeon: Leta Baptist, MD;  Location: [facility];  Service: ENT;  Laterality: N/A;    There were no vitals filed for this visit.  Subjective Assessment - 06/06/19 1512    Subjective  Pt states that she was able to wear the cervical compression for a couple of hours.  She has been attempting to complete the self massage.  Pt agreeable to compression bandaging to her RT UE.  Lt UE has a port and can not be bandaged.    Pertinent History  cancer    Patient Stated Goals  less swelling better abilty to move            LYMPHEDEMA/ONCOLOGY QUESTIONNAIRE - 06/06/19 1324      Right Upper Extremity Lymphedema   10 cm Proximal to Olecranon Process  41.5 cm    Olecranon Process  31.7 cm    15 cm Proximal to Ulnar Styloid Process  29.6 cm    Just Proximal to Ulnar Styloid  Process  19 cm    Across Hand at [employer]  22 cm    At [place] of 2nd Digit  7.4 cm      Left Upper Extremity Lymphedema   10 cm Proximal to Olecranon Process  43.6 cm    Olecranon Process  30.8 cm    15 cm Proximal to Ulnar Styloid Process  28.6 cm    Just Proximal to Ulnar Styloid Process  18.6 cm    Across Hand at [employer]  19.8 cm    At [place] of 2nd Digit  7.2 cm      Head and Neck   4 cm superior to sternal notch around neck  51.4 cm    6 cm superior to sternal notch around neck  53.2 cm    8 cm superior to sternal notch around neck  53.2 cm                [facility] Adult PT Treatment/Exercise - 06/06/19 0001      Manual Therapy   Manual Therapy  Manual Lymphatic Drainage (MLD)    Manual therapy comments  completed seperately from all other skilled interventions  Manual Lymphatic Drainage (MLD)  To include chest and neck, supraclavicular, deep and superfical abdominal, and B UE both anterior and posteriorly routing fluid using axillary-inguinal anastomosis.  Foam cut and multilayer compression bandaging to RT UE.               PT Education - 06/06/19 1515    Education Details  To take bandaging off if it is causing any pain.    Person(s) Educated  Patient    Methods  Explanation    Comprehension  Verbalized understanding          PT Long Term Goals - 06/06/19 1521      PT LONG TERM GOAL #1   Title  Pt will have decrease of circumference of neck at 8 cm proximal to sternal notch by 2 cm    Baseline  51 cm on 05/18/2019    Time  4    Period  Weeks    Status  On-going      PT LONG TERM GOAL #2   Title  Pt will be independent in diaphragmatic breathing and self MLD to help control lymphedema at home.    Time  4    Period  Weeks    Status  On-going      PT LONG TERM GOAL #3   Title  Pt will report the discomfort she feels from swelling had decreased by 25%    Status  On-going      PT LONG TERM GOAL #4   Title  Pt will be indepenedent in  a HEP for remedial exercise of right shoulder and neck AROM    Time  4    Period  Weeks    Status  On-going            Plan - 06/06/19 1517    Clinical Impression Statement  Pt breasts have significant decreased induration.  Noted increased volumes with remeasurment, however, a different therapist completed the measurements this visit which may be the reason for the increase.  Pt will be measured again next Monday if she has shown progress we will requests more visits as pt is significantly involved and will need more visits if volumes are decreasing to obtain maximum benefit.    Personal Factors and Comorbidities  Comorbidity 3+    Comorbidities  recent radiation, recent dental absess, recent radiation to chest, ongoing chemo    Examination-Activity Limitations  Dressing;Bathing    Stability/Clinical Decision Making  Evolving/Moderate complexity    Rehab Potential  Good    PT Frequency  3x / week    PT Duration  4 weeks    PT Treatment/Interventions  ADLs/Self Care Home Management;DME Instruction;Therapeutic activities;Therapeutic exercise;Patient/family education;Orthotic Fit/Training;Manual techniques;Manual lymph drainage;Passive range of motion;Taping    PT Next Visit Plan  check on order for compression bra see how pt tolerated compression bandaging on her Rt UE>    PT Home Exercise Plan  7/29:  AROM all motions cervical in seated, bil UE flexion/abduction AROM    Consulted and Agree with Plan of Care  Patient       Patient will benefit from skilled therapeutic intervention in order to improve the following deficits and impairments:  Decreased skin integrity, Impaired perceived functional ability, Increased edema, Decreased range of motion, Decreased knowledge of precautions, Decreased activity tolerance, Decreased knowledge of use of DME, Decreased strength, Increased fascial restricitons, Impaired flexibility, Impaired UE functional use  Visit Diagnosis: 1. Stiffness of right  shoulder, not elsewhere classified  2. Lymphedema, not elsewhere classified   3. Abnormal posture        Problem List Patient Active Problem List   Diagnosis Date Noted  . Squamous cell lung cancer, unspecified laterality ([place]) 04/05/2019  . Goals of care, counseling/discussion 04/05/2019  . 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 Squamous cell carcinoma of lymph node ([place]) 03/17/2019  . 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 Glucose intolerance 03/11/2019  . Obesity, Class III, BMI 40-49.9 (morbid obesity) ([place]) 03/11/2019  . Tobacco use 03/11/2019  . Dental abscess 03/07/2019  . 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 Depression     Rayetta Humphrey, [place] CLT [phone] 06/06/2019, 3:22 PM  [facility] [address] [place], [place], [address] Phone: [phone]   Fax:  [phone]  Name: Lawanna Cecere MRN: [id number] Date of Birth: 02-13-1981

## 2019-06-06 NOTE — Patient Instructions (Signed)
[facility]  Discharge Instructions:   _______________________________________________________________  Thank you for choosing [facility] to provide your oncology and hematology care.  To afford each patient quality time with our providers, please arrive at least 15 minutes before your scheduled appointment.  You need to re-schedule your appointment if you arrive 10 or more minutes late.  We strive to give you quality time with our providers, and arriving late affects you and other patients whose appointments are after yours.  Also, if you no show three or more times for appointments you may be dismissed from the clinic.  Again, thank you for choosing [facility]. Our hope is that these requests will allow you access to exceptional care and in a timely manner. _______________________________________________________________  If you have questions after your visit, please contact our office at [phone] between the hours of 8:30 a.m. and 5:00 p.m. Voicemails left after 4:30 p.m. will not be returned until the following business day. _______________________________________________________________  For prescription refill requests, have your pharmacy contact our office. _______________________________________________________________  Recommendations made by the consultant and any test results will be sent to your referring physician. _______________________________________________________________ 

## 2019-06-06 NOTE — Progress Notes (Signed)
Pt nauseas and vomiting upon assessment. Per pt, " the CT contrast made me nauseas." Reported pt complaints to RNester NP. VO received. May give 8mg  of Zofran IV push at this time.

## 2019-06-07 ENCOUNTER — Ambulatory Visit (HOSPITAL_COMMUNITY): Payer: BC Managed Care – PPO

## 2019-06-07 ENCOUNTER — Ambulatory Visit (HOSPITAL_COMMUNITY): Payer: BC Managed Care – PPO | Admitting: Physical Therapy

## 2019-06-07 ENCOUNTER — Telehealth (HOSPITAL_COMMUNITY): Payer: Self-pay | Admitting: Physical Therapy

## 2019-06-07 ENCOUNTER — Other Ambulatory Visit (HOSPITAL_COMMUNITY): Payer: Self-pay | Admitting: Hematology

## 2019-06-07 NOTE — Telephone Encounter (Signed)
First no show.  Called pt and left message that her next appointment is Friday at 10:45.  Rayetta Humphrey, [place] CLT [phone]

## 2019-06-09 ENCOUNTER — Encounter (HOSPITAL_COMMUNITY): Payer: BC Managed Care – PPO

## 2019-06-09 ENCOUNTER — Other Ambulatory Visit (HOSPITAL_COMMUNITY): Payer: Self-pay

## 2019-06-09 DIAGNOSIS — C771 Secondary and unspecified malignant neoplasm of intrathoracic lymph nodes: Secondary | ICD-10-CM | POA: Insufficient documentation

## 2019-06-09 DIAGNOSIS — C779 Secondary and unspecified malignant neoplasm of lymph node, unspecified: Secondary | ICD-10-CM

## 2019-06-09 NOTE — Progress Notes (Signed)
  Radiation Oncology         [phone] ________________________________  Name: Tara Aguilar MRN: [id number]  Date: 03/17/2019  DOB: 21-Dec-1980  SIMULATION AND TREATMENT PLANNING NOTE  DIAGNOSIS:     ICD-10-CM   1. 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  Identity was confirmed.  All relevant records and images related to the planned course of therapy were reviewed.   Written consent to proceed with treatment was confirmed which was freely given after reviewing the details related to the planned course of therapy had been reviewed with the patient.  Then, the patient was set-up in a stable reproducible  supine position for radiation therapy.  CT images were obtained.  Surface markings were placed.    Medically necessarydevice(s) for immobilization:  wingboard.   The CT images were loaded into the planning software.  Then the target and avoidance structures were contoured.  Treatment planning then occurred.  The radiation prescription was entered and confirmed.  A total of 2 complex treatment devices were fabricated which relate to the designed radiation treatment fields. Each of these customized fields/ complex treatment devices will be used on a daily basis during the radiation course. I have requested : 3D Simulation  I have requested a DVH of the following structures: target volume, lungs, spinal cord.   The patient will undergo daily image guidance to ensure accurate localization of the target, and adequate minimize dose to the normal surrounding structures in close proximity to the target.   PLAN:  The patient will receive 30 Gy in 10 fractions.  ________________________________   Jodelle Gross, MD, PhD

## 2019-06-10 ENCOUNTER — Ambulatory Visit (HOSPITAL_COMMUNITY): Payer: BC Managed Care – PPO | Admitting: Physical Therapy

## 2019-06-10 ENCOUNTER — Other Ambulatory Visit: Payer: Self-pay

## 2019-06-10 DIAGNOSIS — R293 Abnormal posture: Secondary | ICD-10-CM

## 2019-06-10 DIAGNOSIS — I890 Lymphedema, not elsewhere classified: Secondary | ICD-10-CM

## 2019-06-10 DIAGNOSIS — M25611 Stiffness of right shoulder, not elsewhere classified: Secondary | ICD-10-CM | POA: Diagnosis not present

## 2019-06-10 NOTE — Therapy (Signed)
[facility] [place], [place], [address] Phone: [phone]   Fax:  [phone]  Physical Therapy Treatment  Patient Details  Name: Tara Aguilar MRN: [id number] Date of Birth: [birth year] Referring Provider (PT): Shona Simpson    Encounter Date: 06/10/2019  PT End of Session - 06/10/19 1240    Visit Number  6    Number of Visits  9    Date for PT Re-Evaluation  06/17/19    Authorization - Visit Number  6    Authorization - Number of Visits  9    PT Start Time  2229    PT Stop Time  1207    PT Time Calculation (min)  70 min    Activity Tolerance  Patient tolerated treatment well    Behavior During Therapy  [facility] for tasks assessed/performed       Past Medical History:  Diagnosis Date  . Depression   . Glucose intolerance 03/11/2019  . Obesity, Class III, BMI 40-49.9 (morbid obesity) ([place]) 03/11/2019    Past Surgical History:  Procedure Laterality Date  . CESAREAN SECTION    . IRRIGATION AND DEBRIDEMENT ABSCESS  03/15/2019   RETROPHARYNGEAL   . TONSILLECTOMY AND ADENOIDECTOMY N/A 03/15/2019   Procedure: IRRIGATION AND DEBRIDEMENT OF RETROPHARYNGEAL ABSCESS;  Surgeon: Leta Baptist, MD;  Location: [facility];  Service: ENT;  Laterality: N/A;    There were no vitals filed for this visit.  Subjective Assessment - 06/10/19 1237    Subjective  PT states that she is able to feel her nipples for the first time in a long time.  She states that there was so much swelling that her breast were hard and had no feeling. States that she forgot the bandages at home.  She had to take them off at midnight the day she was bandaged due to intolerance.    Pertinent History  cancer    Patient Stated Goals  less swelling better abilty to move             [facility] Adult PT Treatment/Exercise - 06/10/19 0001      Manual Therapy   Manual Therapy  Manual Lymphatic Drainage (MLD)    Manual therapy comments  completed seperately from all other  skilled interventions    Manual Lymphatic Drainage (MLD)  To include chest and neck, supraclavicular, deep and superfical abdominal, and B UE both anterior and posteriorly routing fluid using axillary-inguinal anastomosis.  Began face decompression techniques.                  PT Long Term Goals - 06/06/19 1521      PT LONG TERM GOAL #1   Title  Pt will have decrease of circumference of neck at 8 cm proximal to sternal notch by 2 cm    Baseline  51 cm on 05/18/2019    Time  4    Period  Weeks    Status  On-going      PT LONG TERM GOAL #2   Title  Pt will be independent in diaphragmatic breathing and self MLD to help control lymphedema at home.    Time  4    Period  Weeks    Status  On-going      PT LONG TERM GOAL #3   Title  Pt will report the discomfort she feels from swelling had decreased by 25%    Status  On-going      PT LONG TERM GOAL #4  Title  Pt will be indepenedent in a HEP for remedial exercise of right shoulder and neck AROM    Time  4    Period  Weeks    Status  On-going            Plan - 06/10/19 1240    Clinical Impression Statement  Ms. Kope forgot to bring her compression bandages back therefore therapist was unable to complete compression bandaging at end of session.  Pt encouraged to complete self massage techniques at home.  PT given prescription for compression bra and UE and information on [facility] [place] to obtain these items.    Personal Factors and Comorbidities  Comorbidity 3+    Comorbidities  recent radiation, recent dental absess, recent radiation to chest, ongoing chemo    Examination-Activity Limitations  Dressing;Bathing    Stability/Clinical Decision Making  Evolving/Moderate complexity    Rehab Potential  Good    PT Frequency  3x / week    PT Duration  4 weeks    PT Treatment/Interventions  ADLs/Self Care Home Management;DME Instruction;Therapeutic activities;Therapeutic exercise;Patient/family education;Orthotic  Fit/Training;Manual techniques;Manual lymph drainage;Passive range of motion;Taping    PT Next Visit Plan  Continue with manual.  Measure nest visit.    PT Home Exercise Plan  7/29:  AROM all motions cervical in seated, bil UE flexion/abduction AROM    Consulted and Agree with Plan of Care  Patient       Patient will benefit from skilled therapeutic intervention in order to improve the following deficits and impairments:  Decreased skin integrity, Impaired perceived functional ability, Increased edema, Decreased range of motion, Decreased knowledge of precautions, Decreased activity tolerance, Decreased knowledge of use of DME, Decreased strength, Increased fascial restricitons, Impaired flexibility, Impaired UE functional use  Visit Diagnosis: 1. Stiffness of right shoulder, not elsewhere classified   2. Lymphedema, not elsewhere classified   3. Abnormal posture        Problem List Patient Active Problem List   Diagnosis Date Noted  . Secondary and unspecified malignant neoplasm of intrathoracic lymph nodes ([place]) 06/09/2019  . Squamous cell lung cancer, unspecified laterality ([place]) 04/05/2019  . Goals of care, counseling/discussion 04/05/2019  . 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 Squamous cell carcinoma of lymph node ([place]) 03/17/2019  . 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 Glucose intolerance 03/11/2019  . Obesity, Class III, BMI 40-49.9 (morbid obesity) ([place]) 03/11/2019  . Tobacco use 03/11/2019  . Dental abscess 03/07/2019  . 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 Depression   Rayetta Humphrey, [place] CLT [phone] 06/10/2019, 12:44 PM  [facility] [address] [place], [place], [address] Phone: [phone]   Fax:  [phone]  Name: Tara Aguilar MRN: [id number] Date of Birth: 06/03/1981

## 2019-06-13 ENCOUNTER — Other Ambulatory Visit (HOSPITAL_COMMUNITY): Payer: BC Managed Care – PPO

## 2019-06-13 ENCOUNTER — Encounter (HOSPITAL_COMMUNITY): Payer: Self-pay | Admitting: Hematology

## 2019-06-13 ENCOUNTER — Inpatient Hospital Stay (HOSPITAL_COMMUNITY): Payer: BC Managed Care – PPO

## 2019-06-13 ENCOUNTER — Inpatient Hospital Stay (HOSPITAL_BASED_OUTPATIENT_CLINIC_OR_DEPARTMENT_OTHER): Payer: BC Managed Care – PPO | Admitting: Hematology

## 2019-06-13 ENCOUNTER — Other Ambulatory Visit: Payer: Self-pay

## 2019-06-13 ENCOUNTER — Encounter (HOSPITAL_COMMUNITY): Payer: BC Managed Care – PPO | Admitting: Physical Therapy

## 2019-06-13 VITALS — HR 110

## 2019-06-13 DIAGNOSIS — C3490 Malignant neoplasm of unspecified part of unspecified bronchus or lung: Secondary | ICD-10-CM | POA: Diagnosis not present

## 2019-06-13 DIAGNOSIS — Z452 Encounter for adjustment and management of vascular access device: Secondary | ICD-10-CM | POA: Diagnosis not present

## 2019-06-13 DIAGNOSIS — C779 Secondary and unspecified malignant neoplasm of lymph node, unspecified: Secondary | ICD-10-CM

## 2019-06-13 LAB — CBC WITH DIFFERENTIAL/PLATELET
Abs Immature Granulocytes: 0.02 10*3/uL (ref 0.00–0.07)
Basophils Absolute: 0 10*3/uL (ref 0.0–0.1)
Basophils Relative: 1 %
Eosinophils Absolute: 0 10*3/uL (ref 0.0–0.5)
Eosinophils Relative: 1 %
HCT: 36.2 % (ref 36.0–46.0)
Hemoglobin: 11.4 g/dL — ABNORMAL LOW (ref 12.0–15.0)
Immature Granulocytes: 1 %
Lymphocytes Relative: 13 %
Lymphs Abs: 0.6 10*3/uL — ABNORMAL LOW (ref 0.7–4.0)
MCH: 29.4 pg (ref 26.0–34.0)
MCHC: 31.5 g/dL (ref 30.0–36.0)
MCV: 93.3 fL (ref 80.0–100.0)
Monocytes Absolute: 0.6 10*3/uL (ref 0.1–1.0)
Monocytes Relative: 13 %
Neutro Abs: 3.2 10*3/uL (ref 1.7–7.7)
Neutrophils Relative %: 71 %
Platelets: 159 10*3/uL (ref 150–400)
RBC: 3.88 MIL/uL (ref 3.87–5.11)
RDW: 16.5 % — ABNORMAL HIGH (ref 11.5–15.5)
WBC: 4.4 10*3/uL (ref 4.0–10.5)
nRBC: 0 % (ref 0.0–0.2)

## 2019-06-13 LAB — COMPREHENSIVE METABOLIC PANEL
ALT: 33 U/L (ref 0–44)
AST: 20 U/L (ref 15–41)
Albumin: 3.6 g/dL (ref 3.5–5.0)
Alkaline Phosphatase: 53 U/L (ref 38–126)
Anion gap: 9 (ref 5–15)
BUN: 9 mg/dL (ref 6–20)
CO2: 23 mmol/L (ref 22–32)
Calcium: 9.3 mg/dL (ref 8.9–10.3)
Chloride: 106 mmol/L (ref 98–111)
Creatinine, Ser: 0.65 mg/dL (ref 0.44–1.00)
GFR calc Af Amer: >60 mL/min (ref >60)
GFR calc non Af Amer: >60 mL/min (ref >60)
Glucose, Bld: 100 mg/dL — ABNORMAL HIGH (ref 70–99)
Potassium: 3.7 mmol/L (ref 3.5–5.1)
Sodium: 138 mmol/L (ref 135–145)
Total Bilirubin: 0.4 mg/dL (ref 0.3–1.2)
Total Protein: 7 g/dL (ref 6.5–8.1)

## 2019-06-13 LAB — TSH: TSH: <0.01 u[IU]/mL — ABNORMAL LOW (ref 0.350–4.500)

## 2019-06-13 MED ORDER — SODIUM CHLORIDE 0.9% FLUSH
10.0000 mL | INTRAVENOUS | Status: DC | PRN
  Administered 2019-06-13: 10:00:00 10 mL
  Filled 2019-06-13: qty 10

## 2019-06-13 MED ORDER — SODIUM CHLORIDE 0.9 % IV SOLN
Freq: Once | INTRAVENOUS | Status: AC
  Administered 2019-06-13: 11:00:00 via INTRAVENOUS
  Filled 2019-06-13: qty 5

## 2019-06-13 MED ORDER — DIPHENHYDRAMINE HCL 50 MG/ML IJ SOLN
50.0000 mg | Freq: Once | INTRAMUSCULAR | Status: AC
  Administered 2019-06-13: 50 mg via INTRAVENOUS
  Filled 2019-06-13: qty 1

## 2019-06-13 MED ORDER — SODIUM CHLORIDE 0.9 % IV SOLN
200.0000 mg/m2 | Freq: Once | INTRAVENOUS | Status: AC
  Administered 2019-06-13: 13:00:00 492 mg via INTRAVENOUS
  Filled 2019-06-13: qty 82

## 2019-06-13 MED ORDER — SODIUM CHLORIDE 0.9 % IV SOLN
900.0000 mg | Freq: Once | INTRAVENOUS | Status: AC
  Administered 2019-06-13: 900 mg via INTRAVENOUS
  Filled 2019-06-13: qty 90

## 2019-06-13 MED ORDER — SODIUM CHLORIDE 0.9 % IV SOLN
200.0000 mg | Freq: Once | INTRAVENOUS | Status: AC
  Administered 2019-06-13: 12:00:00 200 mg via INTRAVENOUS
  Filled 2019-06-13: qty 8

## 2019-06-13 MED ORDER — FAMOTIDINE IN NACL 20-0.9 MG/50ML-% IV SOLN
20.0000 mg | Freq: Once | INTRAVENOUS | Status: AC
  Administered 2019-06-13: 20 mg via INTRAVENOUS
  Filled 2019-06-13: qty 50

## 2019-06-13 MED ORDER — PALONOSETRON HCL INJECTION 0.25 MG/5ML
0.2500 mg | Freq: Once | INTRAVENOUS | Status: AC
  Administered 2019-06-13: 0.25 mg via INTRAVENOUS
  Filled 2019-06-13: qty 5

## 2019-06-13 MED ORDER — SODIUM CHLORIDE 0.9 % IV SOLN
Freq: Once | INTRAVENOUS | Status: AC
  Administered 2019-06-13: 11:00:00 via INTRAVENOUS

## 2019-06-13 MED ORDER — LORAZEPAM 2 MG/ML IJ SOLN
1.0000 mg | Freq: Once | INTRAMUSCULAR | Status: AC
  Administered 2019-06-13: 13:00:00 1 mg via INTRAVENOUS

## 2019-06-13 MED ORDER — LORAZEPAM 2 MG/ML IJ SOLN
INTRAMUSCULAR | Status: AC
  Filled 2019-06-13: qty 1

## 2019-06-13 MED ORDER — HEPARIN SOD (PORK) LOCK FLUSH 100 UNIT/ML IV SOLN
250.0000 [IU] | Freq: Once | INTRAVENOUS | Status: AC | PRN
  Administered 2019-06-13: 500 [IU]

## 2019-06-13 NOTE — Progress Notes (Signed)
Treatment given today per MD orders. Tolerated infusion without adverse affects. Vital signs stable. No complaints at this time. Discharged from clinic via wheel chair.  F/U with [facility] as scheduled.

## 2019-06-13 NOTE — Progress Notes (Signed)
06/13/19  Ok to proceed with HR 110 as runs high per MD.  T.O. Dr Katragadda/Catherine Page, RN/Firmin Belisle Ronnald Ramp, PharmD

## 2019-06-13 NOTE — Progress Notes (Signed)
Labs reviewed with MD today at office visit. Will have 2 more treatments before starting on maintenance therapy per MD.

## 2019-06-13 NOTE — Assessment & Plan Note (Signed)
1.  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  She is a current smoker. - Biopsy of the right cervical lymph node consistent with non-small cell cancer, squamous cell. - Palliative XRT to the chest completed on 03/31/2019. - PET scan on 04/04/2019 showed markedly hypermetabolic nodal disease in the right supraclavicular region, right thoracic inlet, high right paratracheal station.  Lymphadenopathy in the right neck, both axilla show low-level FDG accumulation.  Markedly hypermetabolic bilateral adrenal nodules. - 3 cycles of carboplatin, paclitaxel and pembrolizumab from 04/08/2019 through 05/23/2019. - We reviewed results of the CT CAP from 06/06/2019.  Decrease in right supraclavicular node measuring 3.0 x 2.3 cm compared to 5.3 x 4.6 cm.  Right paratracheal node measures 2.9 x 2.1 cm compared to 6.4 x 4.5 cm.  Resolution of adrenal nodules. - She has gotten a very good response.  We reviewed her blood work.  She will proceed with cycle 4 today. -I plan to give her 6 cycles of chemo to get optimal response.  2.  Left base liquid thrombus: -Ultrasound on 05/03/2019 shows positive for nonocclusive thrombus around the PICC line in the left basilic vein, considered superficial thrombus.  No evidence of DVT. -Because of her active malignancy, we have considered a full dose anticoagulation.  She is currently on Eliquis.  3.  Right shoulder pain: -She takes oxycodone 5 mg as needed, mostly at bedtime.

## 2019-06-13 NOTE — Progress Notes (Signed)
[place], [place] [address]   CLINIC:  Medical Oncology/Hematology  PCP:  Glenda Chroman, MD [place] [address] [phone]   REASON FOR VISIT:  Follow-up for squamous cell lung cancer to the adrenals  CURRENT THERAPY: carboplatin, Taxol and pembrolizumab   BRIEF ONCOLOGIC HISTORY:  Oncology History  Squamous cell lung cancer, unspecified laterality ([place])  04/05/2019 Initial Diagnosis   Squamous cell lung cancer, unspecified laterality ([place])   04/08/2019 -  Chemotherapy   The patient had palonosetron (ALOXI) injection 0.25 mg, 0.25 mg, Intravenous,  Once, 4 of 4 cycles Administration: 0.25 mg (04/08/2019), 0.25 mg (05/02/2019), 0.25 mg (05/23/2019), 0.25 mg (06/13/2019) pegfilgrastim (NEULASTA) injection 6 mg, 6 mg, Subcutaneous, Once, 2 of 2 cycles Administration: 6 mg (04/11/2019), 6 mg (05/04/2019) pegfilgrastim (NEULASTA ONPRO KIT) injection 6 mg, 6 mg, Subcutaneous, Once, 3 of 3 cycles pegfilgrastim-cbqv (UDENYCA) injection 6 mg, 6 mg, Subcutaneous, Once, 2 of 3 cycles Administration: 6 mg (05/25/2019) CARBOplatin (PARAPLATIN) 900 mg in sodium chloride 0.9 % 500 mL chemo infusion, 900 mg (100 % of original dose 900 mg), Intravenous,  Once, 4 of 4 cycles Dose modification:   (original dose 900 mg, Cycle 1),   (original dose 900 mg, Cycle 2),   (original dose 900 mg, Cycle 3),   (original dose 900 mg, Cycle 4) Administration: 900 mg (04/08/2019), 900 mg (05/02/2019), 900 mg (05/23/2019) PACLitaxel (TAXOL) 492 mg in sodium chloride 0.9 % 500 mL chemo infusion (> 72m/m2), 200 mg/m2 = 492 mg, Intravenous,  Once, 4 of 4 cycles Administration: 492 mg (04/08/2019), 492 mg (05/02/2019), 492 mg (05/23/2019), 492 mg (06/13/2019) pembrolizumab (KEYTRUDA) 200 mg in sodium chloride 0.9 % 50 mL chemo infusion, 200 mg, Intravenous, Once, 4 of 5 cycles Administration: 200 mg (04/08/2019), 200 mg (05/02/2019), 200 mg (05/23/2019), 200 mg (06/13/2019) fosaprepitant  (EMEND) 150 mg, dexamethasone (DECADRON) 12 mg in sodium chloride 0.9 % 145 mL IVPB, , Intravenous,  Once, 4 of 4 cycles Administration:  (04/08/2019),  (05/02/2019),  (05/23/2019),  (06/13/2019)  for chemotherapy treatment.        INTERVAL HISTORY:  Ms. [id number]. female seen for cycle 4 of chemotherapy.  Cycle 3 was on 05/23/2019.  She had CT scan done last week.  Appetite is 50%.  Energy levels are low.  Shortness of breath on exertion is stable.  Chest pain with tightness at times is also stable.  Denies any fevers or infections.  Denies any nausea, vomiting or diarrhea or constipation.  Denies any tingling or numbness in the extremities.  No ER visits or hospitalizations.   REVIEW OF SYSTEMS:  Review of Systems  Respiratory: Positive for shortness of breath.   Cardiovascular: Positive for chest pain.  All other systems reviewed and are negative.    PAST MEDICAL/SURGICAL HISTORY:  Past Medical History:  Diagnosis Date  . Depression   . Glucose intolerance 03/11/2019  . Obesity, Class III, BMI 40-49.9 (morbid obesity) ([facility] 03/11/2019   Past Surgical History:  Procedure Laterality Date  . CESAREAN SECTION    . IRRIGATION AND DEBRIDEMENT ABSCESS  03/15/2019   RETROPHARYNGEAL   . TONSILLECTOMY AND ADENOIDECTOMY N/A 03/15/2019   Procedure: IRRIGATION AND DEBRIDEMENT OF RETROPHARYNGEAL ABSCESS;  Surgeon: TLeta Baptist MD;  [facility];  Service: ENT;  Laterality: N/A;     SOCIAL HISTORY:  Social History   Socioeconomic History  . Marital status: Widowed    Spouse name: Not on file  .  Number of children: 1  . Years of education: Not on file  . Highest education level: Not on file  Occupational History  . Not on file  Social Needs  . Financial resource strain: Not very hard  . Food insecurity    Worry: Never true    Inability: Never true  . Transportation needs    Medical: No    Non-medical: No  Tobacco Use  . Smoking status: Current Every Day Smoker    Packs/day: 1.00     Years: 25.00    Pack years: 25.00    Types: Cigarettes  . Smokeless tobacco: Never Used  Substance and Sexual Activity  . Alcohol use: Not Currently    Comment: social   . Drug use: Not Currently  . Sexual activity: Not on file  Lifestyle  . Physical activity    Days per week: 0 days    Minutes per session: 0 min  . Stress: Only a little  Relationships  . Social connections    Talks on phone: More than three times a week    Gets together: Twice a week    Attends religious service: Never    Active member of club or organization: No    Attends meetings of clubs or organizations: Never    Relationship status: Widowed  . Intimate partner violence    Fear of current or ex partner: No    Emotionally abused: No    Physically abused: No    Forced sexual activity: No  Other Topics Concern  . Not on file  Social History Narrative   03/30/2019   Husband passed away four months ago from Cancer of the [place].   Pt. Has 52 yo son at home.    FAMILY HISTORY:  Family History  Problem Relation Age of Onset  . Lung cancer Mother   . Hypertension Mother   . Leukemia Father   . Myelodysplastic syndrome Father     CURRENT MEDICATIONS:  Outpatient Encounter Medications as of 06/13/2019  Medication Sig  . buPROPion (WELLBUTRIN XL) 150 MG 24 hr tablet Take 150 mg by mouth daily.  [name] CARBOPLATIN IV Inject into the vein every 21 ( twenty-one) days. X 4 cycles  . Eliquis DVT/PE Starter Pack (ELIQUIS STARTER PACK) 5 MG TABS Take as directed on package: start with two-36m tablets twice daily for 7 days. On day 8, switch to one-573mtablet twice daily.  . furosemide (LASIX) 20 MG tablet Take 1 tablet (20 mg total) by mouth once as needed for up to 1 dose (Take 2050mnce daily as needed).  . i[place]land Kitchenuprofen (ADVIL) 400 MG tablet Take 800 mg by mouth every 6 (six) hours as needed (pain).   . nystatin (MYCOSTATIN/NYSTOP) powder Apply topically 4 (four) times daily.  . o[place] KitchenyCODONE (OXY IR/ROXICODONE) 5 MG  immediate release tablet Take 1 tablet (5 mg total) by mouth every 4 (four) hours as needed for severe pain.  . [place] KitchenCLitaxel (TAXOL IV) Inject into the vein every 21 ( twenty-one) days. X 4 cycles  . PEMBROLIZUMAB IV Inject into the vein every 21 ( twenty-one) days.  . prochlorperazine (COMPAZINE) 10 MG tablet Take 1 tablet (10 mg total) by mouth every 6 (six) hours as needed (Nausea or vomiting).   No facility-administered encounter medications on file as of 06/13/2019.     ALLERGIES:  No Known Allergies   PHYSICAL EXAM:  ECOG Performance status: 1  Vitals:   06/13/19 0921 06/13/19 1655  BP: 116/82 (!) 141/88  Pulse: (!) 110 (!) 107  Resp: 16 17  Temp: (!) 97.1 F (36.2 C) 98.2 F (36.8 C)  SpO2: 97% 97%   Filed Weights   06/13/19 0921  Weight: 262 lb 12.8 oz (119.2 kg)    Physical Exam Constitutional:      Appearance: She is obese.     Comments: Facial edema   HENT:     Mouth/Throat:     Mouth: Mucous membranes are moist.     Pharynx: Oropharynx is clear.  Eyes:     Extraocular Movements: Extraocular movements intact.     Conjunctiva/sclera: Conjunctivae normal.  Neck:     Musculoskeletal: Normal range of motion.  Cardiovascular:     Rate and Rhythm: Normal rate and regular rhythm.     Heart sounds: Normal heart sounds.  Pulmonary:     Effort: Pulmonary effort is normal.     Breath sounds: Normal breath sounds.  Abdominal:     General: Bowel sounds are normal. There is no distension.     Palpations: There is no mass.  Musculoskeletal: Normal range of motion.  Skin:    General: Skin is warm.  Neurological:     General: No focal deficit present.     Mental Status: She is alert and oriented to person, place, and time.  Psychiatric:        Mood and Affect: Mood normal.        Behavior: Behavior normal.        Thought Content: Thought content normal.        Judgment: Judgment normal.      LABORATORY DATA:  I have reviewed the labs as listed.  CBC     Component Value Date/Time   WBC 4.4 06/13/2019 0925   RBC 3.88 06/13/2019 0925   HGB 11.4 (L) 06/13/2019 0925   HCT 36.2 06/13/2019 0925   PLT 159 06/13/2019 0925   MCV 93.3 06/13/2019 0925   MCH 29.4 06/13/2019 0925   MCHC 31.5 06/13/2019 0925   RDW 16.5 (H) 06/13/2019 0925   LYMPHSABS 0.6 (L) 06/13/2019 0925   MONOABS 0.6 06/13/2019 0925   EOSABS 0.0 06/13/2019 0925   BASOSABS 0.0 06/13/2019 0925   CMP Latest Ref Rng & Units 06/13/2019 05/23/2019 05/02/2019  Glucose 70 - 99 mg/dL 100(H) 101(H) 111(H)  BUN 6 - 20 mg/dL _0 Creatinine 0.44 - 1.00 mg/dL 0.65 0.79 0.97  Sodium 135 - 145 mmol/L 138 136 137  Potassium 3.5 - 5.1 mmol/L 3.7 3.3(L) 3.6  Chloride 98 - 111 mmol/L 106 101 101  CO2 22 - 32 mmol/L _1 Calcium 8.9 - 10.3 mg/dL 9.3 9.2 9.2  Total Protein 6.5 - 8.1 g/dL 7.0 7.1 7.3  Total Bilirubin 0.3 - 1.2 mg/dL 0.4 0.4 0.5  Alkaline Phos 38 - 126 U/L 53 70 65  AST 15 - 41 U/L _2 ALT 0 - 44 U/L 33 39 37      ASSESSMENT & PLAN:   Squamous cell lung cancer, unspecified laterality ([place]) 1.  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  She is a current smoker. - Biopsy of the right cervical lymph node consistent with non-small cell cancer, squamous cell. - Palliative XRT to the chest completed on 03/31/2019. - PET scan on 04/04/2019 showed markedly hypermetabolic nodal disease in the right supraclavicular region, right thoracic inlet, high right paratracheal station.  Lymphadenopathy in the right neck,  both axilla show low-level FDG accumulation.  Markedly hypermetabolic bilateral adrenal nodules. - 3 cycles of carboplatin, paclitaxel and pembrolizumab from 04/08/2019 through 05/23/2019. - We reviewed results of the CT CAP from 06/06/2019.  Decrease in right supraclavicular node measuring 3.0 x 2.3 cm compared to 5.3 x 4.6 cm.  Right paratracheal node measures 2.9 x 2.1 cm compared to 6.4 x 4.5 cm.   Resolution of adrenal nodules. - She has gotten a very good response.  We reviewed her blood work.  She will proceed with cycle 4 today. -I plan to give her 6 cycles of chemo to get optimal response.  2.  Left base liquid thrombus: -Ultrasound on 05/03/2019 shows positive for nonocclusive thrombus around the PICC line in the left basilic vein, considered superficial thrombus.  No evidence of DVT. -Because of her active malignancy, we have considered a full dose anticoagulation.  She is currently on Eliquis.  3.  Right shoulder pain: -She takes oxycodone 5 mg as needed, mostly at bedtime.   Total time spent is 25 minutes with more than 50% of the time spent face-to-face discussing treatment plan, counseling and coordination of care.    Derek Jack, MD [place] [phone]

## 2019-06-13 NOTE — Patient Instructions (Signed)
[place] at [facility] Discharge Instructions  You were seen today by Dr. Delton Coombes. He went over your recent lab and scan results. He will see you back in 3 weeks for labs and follow up.   Thank you for choosing [place] at [facility] to provide your oncology and hematology care.  To afford each patient quality time with our provider, please arrive at least 15 minutes before your scheduled appointment time.   If you have a lab appointment with the [facility] please come in thru the  Main Entrance and check in at the main information desk  You need to re-schedule your appointment should you arrive 10 or more minutes late.  We strive to give you quality time with our providers, and arriving late affects you and other patients whose appointments are after yours.  Also, if you no show three or more times for appointments you may be dismissed from the clinic at the providers discretion.     Again, thank you for choosing [facility].  Our hope is that these requests will decrease the amount of time that you wait before being seen by our physicians.       _____________________________________________________________  Should you have questions after your visit to [facility], please contact our office at [phone] between the hours of 8:00 a.m. and 4:30 p.m.  Voicemails left after 4:00 p.m. will not be returned until the following business day.  For prescription refill requests, have your pharmacy contact our office and allow 72 hours.    Cancer Center Support Programs:   > Cancer Support Group  2nd Tuesday of the month 1pm-2pm, Journey Room

## 2019-06-13 NOTE — Patient Instructions (Signed)
[facility] Discharge Instructions for Patients Receiving Chemotherapy  Today you received the following chemotherapy agents   To help prevent nausea and vomiting after your treatment, we encourage you to take your nausea medication   If you develop nausea and vomiting that is not controlled by your nausea medication, call the clinic.   BELOW ARE SYMPTOMS THAT SHOULD BE REPORTED IMMEDIATELY:  *FEVER GREATER THAN 100.5 F  *CHILLS WITH OR WITHOUT FEVER  NAUSEA AND VOMITING THAT IS NOT CONTROLLED WITH YOUR NAUSEA MEDICATION  *UNUSUAL SHORTNESS OF BREATH  *UNUSUAL BRUISING OR BLEEDING  TENDERNESS IN MOUTH AND THROAT WITH OR WITHOUT PRESENCE OF ULCERS  *URINARY PROBLEMS  *BOWEL PROBLEMS  UNUSUAL RASH Items with * indicate a potential emergency and should be followed up as soon as possible.  Feel free to call the clinic should you have any questions or concerns. The clinic phone number is [phone].  Please show the CHEMO ALERT CARD at check-in to the Emergency Department and triage nurse.   

## 2019-06-14 ENCOUNTER — Encounter (HOSPITAL_COMMUNITY): Payer: Self-pay | Admitting: Physical Therapy

## 2019-06-14 ENCOUNTER — Ambulatory Visit (HOSPITAL_COMMUNITY): Payer: BC Managed Care – PPO | Admitting: Physical Therapy

## 2019-06-14 ENCOUNTER — Telehealth (HOSPITAL_COMMUNITY): Payer: Self-pay | Admitting: Physical Therapy

## 2019-06-14 DIAGNOSIS — I890 Lymphedema, not elsewhere classified: Secondary | ICD-10-CM

## 2019-06-14 DIAGNOSIS — M25611 Stiffness of right shoulder, not elsewhere classified: Secondary | ICD-10-CM | POA: Diagnosis not present

## 2019-06-14 DIAGNOSIS — R293 Abnormal posture: Secondary | ICD-10-CM

## 2019-06-14 NOTE — Therapy (Signed)
[facility] [address] [place], [place], [address] Phone: [phone]   Fax:  [phone]  Physical Therapy Treatment  Patient Details  Name: Tara Aguilar MRN: [id number] Date of Birth: December 17, 1980 Referring Provider (PT): Shona Simpson    Encounter Date: 06/14/2019  PT End of Session - 06/14/19 1128    Visit Number  7    Number of Visits  9    Date for PT Re-Evaluation  06/17/19    Authorization - Visit Number  7    Authorization - Number of Visits  9    PT Start Time  0950    PT Stop Time  1108    PT Time Calculation (min)  78 min    Activity Tolerance  Patient tolerated treatment well    Behavior During Therapy  [facility] for tasks assessed/performed       Past Medical History:  Diagnosis Date  . Depression   . Glucose intolerance 03/11/2019  . Obesity, Class III, BMI 40-49.9 (morbid obesity) ([place]) 03/11/2019    Past Surgical History:  Procedure Laterality Date  . CESAREAN SECTION    . IRRIGATION AND DEBRIDEMENT ABSCESS  03/15/2019   RETROPHARYNGEAL   . TONSILLECTOMY AND ADENOIDECTOMY N/A 03/15/2019   Procedure: IRRIGATION AND DEBRIDEMENT OF RETROPHARYNGEAL ABSCESS;  Surgeon: Leta Baptist, MD;  Location: [facility];  Service: ENT;  Laterality: N/A;    There were no vitals filed for this visit.  Subjective Assessment - 06/14/19 1125    Subjective  PT forgot her bandages for her Lt UE again.  She states she can tell she is not as swollen in her chest and back as she was and it is allowing her to walk more normal; "I'm not walking like a monkey anymore".    Pertinent History  cancer    Patient Stated Goals  less swelling better abilty to move    Currently in Pain?  No/denies            LYMPHEDEMA/ONCOLOGY QUESTIONNAIRE - 06/14/19 0955      Right Upper Extremity Lymphedema   10 cm Proximal to Olecranon Process  40.8 cm    Olecranon Process  31.7 cm    15 cm Proximal to Ulnar Styloid Process  29 cm    Just Proximal to Ulnar  Styloid Process  18.8 cm    Across Hand at [employer]  23 cm    At [place] of 2nd Digit  7.5 cm      Left Upper Extremity Lymphedema   10 cm Proximal to Olecranon Process  39.3 cm    Olecranon Process  30.7 cm    15 cm Proximal to Ulnar Styloid Process  29 cm    Just Proximal to Ulnar Styloid Process  18.6 cm    Across Hand at [employer]  22 cm    At [employer] of 2nd Digit  7.2 cm      Head and Neck   4 cm superior to sternal notch around neck  51.6 cm    6 cm superior to sternal notch around neck  51.7 cm    8 cm superior to sternal notch around neck  53.3 cm                [facility] Adult PT Treatment/Exercise - 06/14/19 0001      Manual Therapy   Manual Therapy  Manual Lymphatic Drainage (MLD)    Manual therapy comments  completed seperately from all other  skilled interventions    Manual Lymphatic Drainage (MLD)  To include chest and neck, and facial. supraclavicular, deep and superfical abdominal, and B UE both anterior and posteriorly routing fluid using axillary-inguinal anastomosis.               PT Education - 06/14/19 1128    Education Details  Encouraged pt to begin gentle two finger facial decongestive techniques at home.          PT Long Term Goals - 06/14/19 1132      PT LONG TERM GOAL #1   Title  Pt will have decrease of circumference of neck at 8 cm proximal to sternal notch by 2 cm    Baseline  51 cm on 05/18/2019    Time  4    Period  Weeks    Status  On-going      PT LONG TERM GOAL #2   Title  Pt will be independent in diaphragmatic breathing and self MLD to help control lymphedema at home.    Time  4    Period  Weeks    Status  Achieved      PT LONG TERM GOAL #3   Title  Pt will report the discomfort she feels from swelling had decreased by 25%    Status  Achieved      PT LONG TERM GOAL #4   Title  Pt will be indepenedent in a HEP for remedial exercise of right shoulder and neck AROM    Time  4    Period  Weeks    Status   Achieved            Plan - 06/14/19 1129    Clinical Impression Statement  Pt forgot her short stretch bandages therefore compression bandaging could not be done.  She has not made an appointment for her compression bra but states she does not feel she will be able to do this until she is done with chemo due to being so busy with both her lymphedema and chemo appointments.  PT should be done with chemo this month.  Pt measurements were taken with mixed volume changes in UE and neck but significant decreased induration noted in axillary and back area as well as the breasts today.  Pt has been completing self massaging techniques at home.    Personal Factors and Comorbidities  Comorbidity 3+    Comorbidities  recent radiation, recent dental absess, recent radiation to chest, ongoing chemo    Examination-Activity Limitations  Dressing;Bathing    Stability/Clinical Decision Making  Evolving/Moderate complexity    Rehab Potential  Good    PT Frequency  3x / week    PT Duration  4 weeks    PT Treatment/Interventions  ADLs/Self Care Home Management;DME Instruction;Therapeutic activities;Therapeutic exercise;Patient/family education;Orthotic Fit/Training;Manual techniques;Manual lymph drainage;Passive range of motion;Taping    PT Next Visit Plan  Continue with manual.  Measure nest visit.    PT Home Exercise Plan  7/29:  AROM all motions cervical in seated, bil UE flexion/abduction AROM    Consulted and Agree with Plan of Care  Patient       Patient will benefit from skilled therapeutic intervention in order to improve the following deficits and impairments:  Decreased skin integrity, Impaired perceived functional ability, Increased edema, Decreased range of motion, Decreased knowledge of precautions, Decreased activity tolerance, Decreased knowledge of use of DME, Decreased strength, Increased fascial restricitons, Impaired flexibility, Impaired UE functional use  Visit Diagnosis: 1. Stiffness  of right shoulder, not elsewhere classified   2. Lymphedema, not elsewhere classified   3. Abnormal posture        Problem List Patient Active Problem List   Diagnosis Date Noted  . Secondary and unspecified malignant neoplasm of intrathoracic lymph nodes ([place]) 06/09/2019  . Squamous cell lung cancer, unspecified laterality ([place]) 04/05/2019  . Goals of care, counseling/discussion 04/05/2019  . 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 Glucose intolerance 03/11/2019  . Obesity, Class III, BMI 40-49.9 (morbid obesity) ([place]) 03/11/2019  . Tobacco use 03/11/2019  . Dental abscess 03/07/2019  . 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 Depression     Rayetta Humphrey, [place] CLT [phone] 06/14/2019, 11:34 AM  [facility] [place], [place], [address] Phone: [phone]   Fax:  [phone]  Name: Tara Aguilar MRN: [id number] Date of Birth: 09/08/1981

## 2019-06-14 NOTE — Telephone Encounter (Signed)
Confirmed with pt that she will be here tomorrow at 11:15.

## 2019-06-15 ENCOUNTER — Ambulatory Visit (HOSPITAL_COMMUNITY): Payer: BC Managed Care – PPO | Admitting: Physical Therapy

## 2019-06-15 ENCOUNTER — Encounter (HOSPITAL_COMMUNITY): Payer: Self-pay

## 2019-06-15 ENCOUNTER — Other Ambulatory Visit: Payer: Self-pay

## 2019-06-15 ENCOUNTER — Inpatient Hospital Stay (HOSPITAL_COMMUNITY): Payer: BC Managed Care – PPO

## 2019-06-15 VITALS — BP 133/81 | HR 124 | Temp 97.5°F | Resp 18

## 2019-06-15 DIAGNOSIS — C3490 Malignant neoplasm of unspecified part of unspecified bronchus or lung: Secondary | ICD-10-CM

## 2019-06-15 DIAGNOSIS — Z452 Encounter for adjustment and management of vascular access device: Secondary | ICD-10-CM | POA: Diagnosis not present

## 2019-06-15 MED ORDER — PEGFILGRASTIM-CBQV 6 MG/0.6ML ~~LOC~~ SOSY
PREFILLED_SYRINGE | SUBCUTANEOUS | Status: AC
  Filled 2019-06-15: qty 0.6

## 2019-06-15 MED ORDER — HEPARIN SOD (PORK) LOCK FLUSH 100 UNIT/ML IV SOLN
250.0000 [IU] | Freq: Once | INTRAVENOUS | Status: AC
  Administered 2019-06-15: 250 [IU] via INTRAVENOUS

## 2019-06-15 MED ORDER — SODIUM CHLORIDE 0.9% FLUSH
3.0000 mL | INTRAVENOUS | Status: DC | PRN
  Administered 2019-06-15: 3 mL via INTRAVENOUS
  Filled 2019-06-15: qty 10

## 2019-06-15 MED ORDER — PEGFILGRASTIM-CBQV 6 MG/0.6ML ~~LOC~~ SOSY
6.0000 mg | PREFILLED_SYRINGE | Freq: Once | SUBCUTANEOUS | Status: AC
  Administered 2019-06-15: 6 mg via SUBCUTANEOUS

## 2019-06-15 NOTE — Progress Notes (Signed)
Tara Aguilar tolerated Udenyca injection and PICC line flush well without complaints or incident. VSS Pt discharged via wheelchair in satisfactory condition

## 2019-06-15 NOTE — Patient Instructions (Signed)
[place] at [facility] Discharge Instructions  Received Udenyca injection and PICC line flushed per protocol. Follow-up as scheduled. Call clinic for any questions or concerns   Thank you for choosing [place] at [facility] to provide your oncology and hematology care.  To afford each patient quality time with our provider, please arrive at least 15 minutes before your scheduled appointment time.   If you have a lab appointment with the [facility] please come in thru the  Main Entrance and check in at the main information desk  You need to re-schedule your appointment should you arrive 10 or more minutes late.  We strive to give you quality time with our providers, and arriving late affects you and other patients whose appointments are after yours.  Also, if you no show three or more times for appointments you may be dismissed from the clinic at the providers discretion.     Again, thank you for choosing [facility].  Our hope is that these requests will decrease the amount of time that you wait before being seen by our physicians.       _____________________________________________________________  Should you have questions after your visit to [facility], please contact our office at [phone] between the hours of 8:00 a.m. and 4:30 p.m.  Voicemails left after 4:00 p.m. will not be returned until the following business day.  For prescription refill requests, have your pharmacy contact our office and allow 72 hours.    Cancer Center Support Programs:   > Cancer Support Group  2nd Tuesday of the month 1pm-2pm, Journey Room

## 2019-06-17 ENCOUNTER — Encounter (HOSPITAL_COMMUNITY): Payer: BC Managed Care – PPO | Admitting: Physical Therapy

## 2019-06-20 ENCOUNTER — Other Ambulatory Visit: Payer: Self-pay

## 2019-06-20 ENCOUNTER — Inpatient Hospital Stay (HOSPITAL_COMMUNITY): Payer: BC Managed Care – PPO

## 2019-06-20 VITALS — BP 131/74 | HR 115 | Temp 97.5°F | Resp 18

## 2019-06-20 DIAGNOSIS — C3490 Malignant neoplasm of unspecified part of unspecified bronchus or lung: Secondary | ICD-10-CM

## 2019-06-20 DIAGNOSIS — Z452 Encounter for adjustment and management of vascular access device: Secondary | ICD-10-CM | POA: Diagnosis not present

## 2019-06-20 MED ORDER — SODIUM CHLORIDE 0.9% FLUSH
10.0000 mL | Freq: Once | INTRAVENOUS | Status: AC
  Administered 2019-06-20: 10 mL via INTRAVENOUS

## 2019-06-20 MED ORDER — HEPARIN SOD (PORK) LOCK FLUSH 100 UNIT/ML IV SOLN
500.0000 [IU] | Freq: Once | INTRAVENOUS | Status: AC
  Administered 2019-06-20: 500 [IU] via INTRAVENOUS

## 2019-06-20 NOTE — Progress Notes (Signed)
PICC line flushed per policy.  Good blood return noted with bilateral PICC line.  Insertion site clean and dry with no redness or drainage noted.  No complaints of pain with flush.   VSs with discharge and left by wheelchair with no s/s of distress noted.

## 2019-06-21 ENCOUNTER — Telehealth (HOSPITAL_COMMUNITY): Payer: Self-pay | Admitting: Physical Therapy

## 2019-06-21 ENCOUNTER — Ambulatory Visit (HOSPITAL_COMMUNITY): Payer: BC Managed Care – PPO | Admitting: Physical Therapy

## 2019-06-21 NOTE — Telephone Encounter (Signed)
Called pt re missed appointment.  Pt did not answer.  A message was left.  Rayetta Humphrey, [place] CLT [phone]

## 2019-06-23 ENCOUNTER — Ambulatory Visit (HOSPITAL_COMMUNITY): Payer: BC Managed Care – PPO | Admitting: Physical Therapy

## 2019-06-24 ENCOUNTER — Other Ambulatory Visit (HOSPITAL_COMMUNITY): Payer: Self-pay | Admitting: Hematology

## 2019-06-24 DIAGNOSIS — C779 Secondary and unspecified malignant neoplasm of lymph node, unspecified: Secondary | ICD-10-CM

## 2019-06-24 DIAGNOSIS — C3490 Malignant neoplasm of unspecified part of unspecified bronchus or lung: Secondary | ICD-10-CM

## 2019-06-24 MED ORDER — OXYCODONE HCL 5 MG PO TABS: 5.0000 mg | ORAL_TABLET | ORAL | 0 refills | Status: DC | PRN

## 2019-06-27 ENCOUNTER — Inpatient Hospital Stay (HOSPITAL_COMMUNITY): Payer: BC Managed Care – PPO

## 2019-06-27 ENCOUNTER — Other Ambulatory Visit: Payer: Self-pay

## 2019-06-27 ENCOUNTER — Encounter (HOSPITAL_COMMUNITY): Payer: Self-pay

## 2019-06-27 VITALS — BP 114/79 | HR 109 | Temp 97.7°F | Resp 18

## 2019-06-27 DIAGNOSIS — C3490 Malignant neoplasm of unspecified part of unspecified bronchus or lung: Secondary | ICD-10-CM

## 2019-06-27 DIAGNOSIS — Z452 Encounter for adjustment and management of vascular access device: Secondary | ICD-10-CM | POA: Diagnosis not present

## 2019-06-27 MED ORDER — SODIUM CHLORIDE 0.9% FLUSH
10.0000 mL | Freq: Once | INTRAVENOUS | Status: AC
  Administered 2019-06-27: 10 mL via INTRAVENOUS

## 2019-06-27 MED ORDER — HEPARIN SOD (PORK) LOCK FLUSH 100 UNIT/ML IV SOLN
500.0000 [IU] | Freq: Once | INTRAVENOUS | Status: AC
  Administered 2019-06-27: 500 [IU] via INTRAVENOUS

## 2019-06-27 NOTE — Progress Notes (Signed)
PICC line dressing changed per policy.  No complaints of pain with flush.  Good blood return noted.  PICC insertion site clean and dry with no redness or drainage noted.  No bruising or swelling noted with PICC.  VSs with discharge and left by wheelchair with no s/s of distress noted.

## 2019-06-28 ENCOUNTER — Encounter (HOSPITAL_COMMUNITY): Payer: Self-pay | Admitting: Physical Therapy

## 2019-06-28 ENCOUNTER — Other Ambulatory Visit: Payer: Self-pay

## 2019-06-28 ENCOUNTER — Ambulatory Visit (HOSPITAL_COMMUNITY): Payer: BC Managed Care – PPO | Attending: Internal Medicine | Admitting: Physical Therapy

## 2019-06-28 DIAGNOSIS — R293 Abnormal posture: Secondary | ICD-10-CM | POA: Insufficient documentation

## 2019-06-28 DIAGNOSIS — M25611 Stiffness of right shoulder, not elsewhere classified: Secondary | ICD-10-CM | POA: Insufficient documentation

## 2019-06-28 DIAGNOSIS — I890 Lymphedema, not elsewhere classified: Secondary | ICD-10-CM | POA: Insufficient documentation

## 2019-06-28 NOTE — Therapy (Addendum)
[facility] [address] [place], [place], [address] Phone: [phone]   Fax:  [phone]  Physical Therapy Treatment  Patient Details  Name: Tara Aguilar MRN: [id number] Date of Birth: [birth year] Referring Provider (PT): Shona Simpson   Progress Note Reporting Period 05/18/2019  to 06/28/2019  See note below for Objective Data and Assessment of Progress/Goals.      Encounter Date: 06/28/2019  PT End of Session - 06/28/19 1629    Visit Number  8    Number of Visits  16    Date for PT Re-Evaluation  07/28/19    Authorization - Visit Number  8    Authorization - Number of Visits  16    PT Start Time  8185    PT Stop Time  1600    PT Time Calculation (min)  65 min    Activity Tolerance  Patient tolerated treatment well    Behavior During Therapy  WFL for tasks assessed/performed       Past Medical History:  Diagnosis Date  . Depression   . Glucose intolerance 03/11/2019  . Obesity, Class III, BMI 40-49.9 (morbid obesity) ([place]) 03/11/2019    Past Surgical History:  Procedure Laterality Date  . CESAREAN SECTION    . IRRIGATION AND DEBRIDEMENT ABSCESS  03/15/2019   RETROPHARYNGEAL   . TONSILLECTOMY AND ADENOIDECTOMY N/A 03/15/2019   Procedure: IRRIGATION AND DEBRIDEMENT OF RETROPHARYNGEAL ABSCESS;  Surgeon: Leta Baptist, MD;  Location: [facility];  Service: ENT;  Laterality: N/A;    There were no vitals filed for this visit.  Subjective Assessment - 06/28/19 1455    Subjective  PT has not made it to second to nature for compression yet, she has been trying to complete self massaging techniques.    She has not been here since 06/14/2019 due to being ill with Chemo.    Chemo has two more treatments.    Pertinent History  cancer    Patient Stated Goals  less swelling better abilty to move    Currently in Pain?  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 Pt with PICC at left upper arm that is full with edema surrounding the site.      Skin Integrity  darkened skin at chest both  breasts       Sensation   Light Touch  Not tested      Coordination   Gross Motor Movements are Fluid and Coordinated  Yes   pt moves slowly      Posture/Postural Control   Posture/Postural Control  Postural limitations    Postural Limitations  Rounded Shoulders;Forward head    Posture Comments  pt obese with overhanging anterior abdomen and diffuse swelling in upper body       AROM   Overall AROM Comments  left shoulder not tested due to PICC line     Right Shoulder Extension  50 Degrees    Right Shoulder Flexion  120 Degrees    Right Shoulder ABduction  145 Degrees      Palpation   Palpation comment  pt has pitting edema in right hand and forearm. right breast is very firm. Did not fully assess left side        LYMPHEDEMA/ONCOLOGY QUESTIONNAIRE - 06/28/19 1456      Right Upper Extremity Lymphedema   10 cm Proximal to Olecranon Process  40 cm   wsa 41   Olecranon Process  31 cm   31   15 cm Proximal to Ulnar  Styloid Process  28.8 cm   31   Just Proximal to Ulnar Styloid Process  18.8 cm   20   Across Hand at [employer]  22.2 cm   21.5   At [place] of 2nd Digit  6.5 cm   7.5     Left Upper Extremity Lymphedema   10 cm Proximal to Olecranon Process  40.2 cm   was 40   Olecranon Process  30.7 cm   31.5   15 cm Proximal to Ulnar Styloid Process  28.5 cm   30.5   Just Proximal to Ulnar Styloid Process  17.8 cm   19   Across Hand at [employer]  21.8 cm   22   At [place] of 2nd Digit  6.3 cm   7.0     Head and Neck   4 cm superior to sternal notch around neck  51.3 cm   51   6 cm superior to sternal notch around neck  52 cm   51.5   8 cm superior to sternal notch around neck  53 cm   52.3               [facility] Adult PT Treatment/Exercise - 06/28/19 0001      Manual Therapy   Manual Therapy  Manual Lymphatic Drainage (MLD)    Manual therapy comments  completed seperately from all other skilled interventions    Manual Lymphatic Drainage (MLD)  To include chest and neck, and facial. supraclavicular, deep and superfical abdominal, and B UE both anterior and posteriorly routing fluid using axillary-inguinal anastomosis.               PT Education - 06/28/19 1627    Education Details  UE self manual    Person(s) Educated  Patient    Methods  Explanation;Verbal cues;Tactile cues;Handout    Comprehension  Verbalized understanding;Need further instruction          PT Long Term Goals - 06/14/19 1132      PT LONG TERM GOAL #1   Title  Pt will have decrease of circumference of neck at 8 cm proximal to sternal notch by 2 cm    Baseline  51 cm on 05/18/2019  Time  4    Period  Weeks    Status  On-going      PT LONG TERM GOAL #2   Title  Pt will be independent in diaphragmatic breathing and self MLD to help control lymphedema at home.    Time  4    Period  Weeks    Status  Achieved      PT LONG TERM GOAL #3   Title  Pt will report the discomfort she feels  from swelling had decreased by 25%    Status  Achieved      PT LONG TERM GOAL #4   Title  Pt will be indepenedent in a HEP for remedial exercise of right shoulder and neck AROM    Time  4    Period  Weeks    Status  Achieved            Plan - 06/28/19 1630    Clinical Impression Statement  Although pt has not been to treatment since 8/18 she has still made small gains since she was last measured for volume. Main improvement is in the chest and thoracic area.  PT breasts were indurated to a point where they were hard and pt had decreased sensation.  This has resolved at this point, although there is still significant induration in B sub axillary areas.    PT will continue to benefit from decongestive manual techniques for chest, neck face and B UE until maximial volume has been decongested.  PT instructed in UE self massage today.    Personal Factors and Comorbidities  Comorbidity 3+    Comorbidities  recent radiation, recent dental absess, recent radiation to chest, ongoing chemo    Examination-Activity Limitations  Dressing;Bathing    Stability/Clinical Decision Making  Evolving/Moderate complexity    Rehab Potential  Good    PT Frequency  3x / week    PT Duration  4 weeks    PT Treatment/Interventions  ADLs/Self Care Home Management;DME Instruction;Therapeutic activities;Therapeutic exercise;Patient/family education;Orthotic Fit/Training;Manual techniques;Manual lymph drainage;Passive range of motion;Taping    PT Next Visit Plan  Continue with manual.  Review self manual for decongestion of UE    PT Home Exercise Plan  7/29:  AROM all motions cervical in seated, bil UE flexion/abduction AROM    Consulted and Agree with Plan of Care  Patient       Patient will benefit from skilled therapeutic intervention in order to improve the following deficits and impairments:  Decreased skin integrity, Impaired perceived functional ability, Increased edema, Decreased range of motion, Decreased  knowledge of precautions, Decreased activity tolerance, Decreased knowledge of use of DME, Decreased strength, Increased fascial restricitons, Impaired flexibility, Impaired UE functional use  Visit Diagnosis: Stiffness of right shoulder, not elsewhere classified - Plan: PT plan of care cert/re-cert  Lymphedema, not elsewhere classified - Plan: PT plan of care cert/re-cert  Abnormal posture - Plan: PT plan of care cert/re-cert     Problem List Patient Active Problem List   Diagnosis Date Noted  . Secondary and unspecified malignant neoplasm of intrathoracic lymph nodes ([place]) 06/09/2019  . Squamous cell lung cancer, unspecified laterality ([place]) 04/05/2019  . Goals of care, counseling/discussion 04/05/2019  . 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 Squamous cell carcinoma of lymph node ([place]) 03/17/2019  . 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 Glucose intolerance 03/11/2019  . Obesity, Class III, BMI 40-49.9 (morbid obesity) ([place]) 03/11/2019  . Tobacco use 03/11/2019  .  Dental abscess 03/07/2019  . 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 Depression     Rayetta Humphrey, [place] CLT [phone] 06/28/2019, 4:44 PM  [facility] [address] [place], [place], [address] Phone: [phone]   Fax:  [phone]  Name: Tara Aguilar MRN: [id number] Date of Birth: 05/08/1981

## 2019-06-30 ENCOUNTER — Encounter (HOSPITAL_COMMUNITY): Payer: Self-pay | Admitting: Physical Therapy

## 2019-06-30 ENCOUNTER — Other Ambulatory Visit: Payer: Self-pay

## 2019-06-30 ENCOUNTER — Ambulatory Visit (HOSPITAL_COMMUNITY): Payer: BC Managed Care – PPO | Admitting: Physical Therapy

## 2019-06-30 DIAGNOSIS — M25611 Stiffness of right shoulder, not elsewhere classified: Secondary | ICD-10-CM

## 2019-06-30 DIAGNOSIS — R293 Abnormal posture: Secondary | ICD-10-CM

## 2019-06-30 DIAGNOSIS — I890 Lymphedema, not elsewhere classified: Secondary | ICD-10-CM

## 2019-06-30 NOTE — Therapy (Signed)
[facility] [address] [place], [place], [address] Phone: [phone]   Fax:  [phone]  Physical Therapy Treatment  Patient Details  Name: Tara Aguilar MRN: [id number] Date of Birth: Oct 21, 1981 Referring Provider (PT): Shona Simpson    Encounter Date: 06/30/2019  PT End of Session - 06/30/19 1600    Visit Number  9    Number of Visits  16    Date for PT Re-Evaluation  07/28/19    Authorization - Visit Number  9    Authorization - Number of Visits  16    PT Start Time  9983    PT Stop Time  1555    PT Time Calculation (min)  60 min    Activity Tolerance  Patient tolerated treatment well    Behavior During Therapy  [facility] for tasks assessed/performed       Past Medical History:  Diagnosis Date  . Depression   . Glucose intolerance 03/11/2019  . Obesity, Class III, BMI 40-49.9 (morbid obesity) ([place]) 03/11/2019    Past Surgical History:  Procedure Laterality Date  . CESAREAN SECTION    . IRRIGATION AND DEBRIDEMENT ABSCESS  03/15/2019   RETROPHARYNGEAL   . TONSILLECTOMY AND ADENOIDECTOMY N/A 03/15/2019   Procedure: IRRIGATION AND DEBRIDEMENT OF RETROPHARYNGEAL ABSCESS;  Surgeon: Leta Baptist, MD;  Location: [facility];  Service: ENT;  Laterality: N/A;    There were no vitals filed for this visit.  Subjective Assessment - 06/30/19 1557    Subjective  PT states that she feels more confident driving now because she can turn her neck further.  States that she had so much swelling before that she had difficulty turning her head.    Pertinent History  cancer    Patient Stated Goals  less swelling better abilty to move                       [facility] Adult PT Treatment/Exercise - 06/30/19 0001      Posture/Postural Control   Posture/Postural Control  Postural limitations    Postural Limitations  Rounded Shoulders;Forward head    Posture Comments  pt obese with overhanging anterior abdomen and diffuse swelling in upper body        Manual Therapy   Manual Therapy  Manual Lymphatic Drainage (MLD);Compression Bandaging    Manual therapy comments  completed seperately from all other skilled interventions    Manual Lymphatic Drainage (MLD)  To include chest and neck, and facial. supraclavicular, deep and superfical abdominal, and B UE both anterior and posteriorly routing fluid using axillary-inguinal anastomosis.      Compression Bandaging  for Rt UE using foam and multilayer compression bandages, unable to bandage Lt due to port line.              PT Education - 06/30/19 1602    Education Details  self manual for UE    Person(s) Educated  Patient    Methods  Explanation;Handout;Tactile cues    Comprehension  Verbalized understanding          PT Long Term Goals - 06/14/19 1132      PT LONG TERM GOAL #1   Title  Pt will have decrease of circumference of neck at 8 cm proximal to sternal notch by 2 cm    Baseline  51 cm on 05/18/2019    Time  4    Period  Weeks    Status  On-going  PT LONG TERM GOAL #2   Title  Pt will be independent in diaphragmatic breathing and self MLD to help control lymphedema at home.    Time  4    Period  Weeks    Status  Achieved      PT LONG TERM GOAL #3   Title  Pt will report the discomfort she feels from swelling had decreased by 25%    Status  Achieved      PT LONG TERM GOAL #4   Title  Pt will be indepenedent in a HEP for remedial exercise of right shoulder and neck AROM    Time  4    Period  Weeks    Status  Achieved            Plan - 06/30/19 1600    Clinical Impression Statement  Therapist completed manual for chest, neck and B UE.  Pt remembered her compression bandaging therefore therapist was able to bandage pt Rt UE.  Therapist instructed pt and gave pt instruction sheet for self manual to UE>    Personal Factors and Comorbidities  Comorbidity 3+    Comorbidities  recent radiation, recent dental absess, recent radiation to chest, ongoing chemo     Examination-Activity Limitations  Dressing;Bathing    Stability/Clinical Decision Making  Evolving/Moderate complexity    Rehab Potential  Good    PT Frequency  3x / week    PT Duration  4 weeks    PT Treatment/Interventions  ADLs/Self Care Home Management;DME Instruction;Therapeutic activities;Therapeutic exercise;Patient/family education;Orthotic Fit/Training;Manual techniques;Manual lymph drainage;Passive range of motion;Taping    PT Next Visit Plan  [place].    PT Home Exercise Plan  7/29:  AROM all motions cervical in seated, bil UE flexion/abduction AROM    Consulted and Agree with Plan of Care  Patient       Patient will benefit from skilled therapeutic intervention in order to improve the following deficits and impairments:  Decreased skin integrity, Impaired perceived functional ability, Increased edema, Decreased range of motion, Decreased knowledge of precautions, Decreased activity tolerance, Decreased knowledge of use of DME, Decreased strength, Increased fascial restricitons, Impaired flexibility, Impaired UE functional use  Visit Diagnosis: Stiffness of right shoulder, not elsewhere classified  Lymphedema, not elsewhere classified  Abnormal posture     Problem List Patient Active Problem List   Diagnosis Date Noted  . Secondary and unspecified malignant neoplasm of intrathoracic lymph nodes ([place]) 06/09/2019  . Squamous cell lung cancer, unspecified laterality ([place]) 04/05/2019  . Goals of care, counseling/discussion 04/05/2019  . 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 Squamous cell carcinoma of lymph node ([place]) 03/17/2019  . 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 Glucose intolerance 03/11/2019  . Obesity, Class III, BMI 40-49.9 (morbid obesity) ([place]) 03/11/2019  . Tobacco use 03/11/2019  . Dental abscess 03/07/2019  . 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 Depression     Rayetta Humphrey, [place]  CLT [phone] 06/30/2019, 4:03 PM  [facility] [address] [place], [place], [address] Phone: [phone]   Fax:  [phone]  Name: Tara Aguilar MRN: [id number] Date of Birth: 12/24/1980

## 2019-07-05 ENCOUNTER — Inpatient Hospital Stay (HOSPITAL_COMMUNITY): Payer: BC Managed Care – PPO | Attending: Hematology

## 2019-07-05 ENCOUNTER — Encounter (HOSPITAL_COMMUNITY): Payer: BC Managed Care – PPO | Admitting: Physical Therapy

## 2019-07-05 ENCOUNTER — Ambulatory Visit (HOSPITAL_COMMUNITY): Payer: BC Managed Care – PPO | Admitting: Hematology

## 2019-07-05 ENCOUNTER — Inpatient Hospital Stay (HOSPITAL_COMMUNITY): Payer: BC Managed Care – PPO

## 2019-07-05 ENCOUNTER — Inpatient Hospital Stay (HOSPITAL_BASED_OUTPATIENT_CLINIC_OR_DEPARTMENT_OTHER): Payer: BC Managed Care – PPO | Admitting: Hematology

## 2019-07-05 ENCOUNTER — Other Ambulatory Visit: Payer: Self-pay

## 2019-07-05 ENCOUNTER — Encounter (HOSPITAL_COMMUNITY): Payer: Self-pay | Admitting: Hematology

## 2019-07-05 VITALS — BP 115/87 | HR 101 | Temp 98.3°F | Resp 18 | Wt 263.4 lb

## 2019-07-05 DIAGNOSIS — Z5112 Encounter for antineoplastic immunotherapy: Secondary | ICD-10-CM | POA: Insufficient documentation

## 2019-07-05 DIAGNOSIS — C3490 Malignant neoplasm of unspecified part of unspecified bronchus or lung: Secondary | ICD-10-CM | POA: Diagnosis not present

## 2019-07-05 DIAGNOSIS — Z79899 Other long term (current) drug therapy: Secondary | ICD-10-CM | POA: Diagnosis not present

## 2019-07-05 DIAGNOSIS — Z23 Encounter for immunization: Secondary | ICD-10-CM | POA: Insufficient documentation

## 2019-07-05 DIAGNOSIS — I890 Lymphedema, not elsewhere classified: Secondary | ICD-10-CM | POA: Diagnosis not present

## 2019-07-05 DIAGNOSIS — Z452 Encounter for adjustment and management of vascular access device: Secondary | ICD-10-CM | POA: Diagnosis not present

## 2019-07-05 DIAGNOSIS — M25511 Pain in right shoulder: Secondary | ICD-10-CM | POA: Diagnosis not present

## 2019-07-05 DIAGNOSIS — R0602 Shortness of breath: Secondary | ICD-10-CM | POA: Insufficient documentation

## 2019-07-05 DIAGNOSIS — C779 Secondary and unspecified malignant neoplasm of lymph node, unspecified: Secondary | ICD-10-CM

## 2019-07-05 DIAGNOSIS — Z7901 Long term (current) use of anticoagulants: Secondary | ICD-10-CM | POA: Diagnosis not present

## 2019-07-05 DIAGNOSIS — Z8249 Family history of ischemic heart disease and other diseases of the circulatory system: Secondary | ICD-10-CM | POA: Insufficient documentation

## 2019-07-05 DIAGNOSIS — Z5111 Encounter for antineoplastic chemotherapy: Secondary | ICD-10-CM | POA: Insufficient documentation

## 2019-07-05 DIAGNOSIS — Z801 Family history of malignant neoplasm of trachea, bronchus and lung: Secondary | ICD-10-CM | POA: Diagnosis not present

## 2019-07-05 DIAGNOSIS — Z86718 Personal history of other venous thrombosis and embolism: Secondary | ICD-10-CM | POA: Diagnosis not present

## 2019-07-05 DIAGNOSIS — Z5189 Encounter for other specified aftercare: Secondary | ICD-10-CM | POA: Insufficient documentation

## 2019-07-05 DIAGNOSIS — Z923 Personal history of irradiation: Secondary | ICD-10-CM | POA: Diagnosis not present

## 2019-07-05 DIAGNOSIS — Z86711 Personal history of pulmonary embolism: Secondary | ICD-10-CM | POA: Insufficient documentation

## 2019-07-05 DIAGNOSIS — F17210 Nicotine dependence, cigarettes, uncomplicated: Secondary | ICD-10-CM | POA: Diagnosis not present

## 2019-07-05 LAB — CBC WITH DIFFERENTIAL/PLATELET
Abs Immature Granulocytes: 0.01 10*3/uL (ref 0.00–0.07)
Basophils Absolute: 0 10*3/uL (ref 0.0–0.1)
Basophils Relative: 1 %
Eosinophils Absolute: 0.1 10*3/uL (ref 0.0–0.5)
Eosinophils Relative: 1 %
HCT: 38.3 % (ref 36.0–46.0)
Hemoglobin: 11.9 g/dL — ABNORMAL LOW (ref 12.0–15.0)
Immature Granulocytes: 0 %
Lymphocytes Relative: 12 %
Lymphs Abs: 0.6 10*3/uL — ABNORMAL LOW (ref 0.7–4.0)
MCH: 30.7 pg (ref 26.0–34.0)
MCHC: 31.1 g/dL (ref 30.0–36.0)
MCV: 98.7 fL (ref 80.0–100.0)
Monocytes Absolute: 0.6 10*3/uL (ref 0.1–1.0)
Monocytes Relative: 12 %
Neutro Abs: 3.4 10*3/uL (ref 1.7–7.7)
Neutrophils Relative %: 74 %
Platelets: 154 10*3/uL (ref 150–400)
RBC: 3.88 MIL/uL (ref 3.87–5.11)
RDW: 16.8 % — ABNORMAL HIGH (ref 11.5–15.5)
WBC: 4.6 10*3/uL (ref 4.0–10.5)
nRBC: 0 % (ref 0.0–0.2)

## 2019-07-05 LAB — COMPREHENSIVE METABOLIC PANEL
ALT: 28 U/L (ref 0–44)
AST: 17 U/L (ref 15–41)
Albumin: 3.6 g/dL (ref 3.5–5.0)
Alkaline Phosphatase: 59 U/L (ref 38–126)
Anion gap: 9 (ref 5–15)
BUN: 13 mg/dL (ref 6–20)
CO2: 24 mmol/L (ref 22–32)
Calcium: 9.2 mg/dL (ref 8.9–10.3)
Chloride: 106 mmol/L (ref 98–111)
Creatinine, Ser: 0.88 mg/dL (ref 0.44–1.00)
GFR calc Af Amer: >60 mL/min (ref >60)
GFR calc non Af Amer: >60 mL/min (ref >60)
Glucose, Bld: 98 mg/dL (ref 70–99)
Potassium: 3.6 mmol/L (ref 3.5–5.1)
Sodium: 139 mmol/L (ref 135–145)
Total Bilirubin: 0.4 mg/dL (ref 0.3–1.2)
Total Protein: 7 g/dL (ref 6.5–8.1)

## 2019-07-05 MED ORDER — FAMOTIDINE IN NACL 20-0.9 MG/50ML-% IV SOLN
INTRAVENOUS | Status: AC
  Filled 2019-07-05: qty 50

## 2019-07-05 MED ORDER — FAMOTIDINE IN NACL 20-0.9 MG/50ML-% IV SOLN
20.0000 mg | Freq: Once | INTRAVENOUS | Status: AC
  Administered 2019-07-05: 11:00:00 20 mg via INTRAVENOUS

## 2019-07-05 MED ORDER — DIPHENHYDRAMINE HCL 50 MG/ML IJ SOLN
INTRAMUSCULAR | Status: AC
  Filled 2019-07-05: qty 1

## 2019-07-05 MED ORDER — SODIUM CHLORIDE 0.9 % IV SOLN
900.0000 mg | Freq: Once | INTRAVENOUS | Status: AC
  Administered 2019-07-05: 16:00:00 900 mg via INTRAVENOUS
  Filled 2019-07-05: qty 90

## 2019-07-05 MED ORDER — SODIUM CHLORIDE 0.9 % IV SOLN
Freq: Once | INTRAVENOUS | Status: AC
  Administered 2019-07-05: 10:00:00 via INTRAVENOUS

## 2019-07-05 MED ORDER — SODIUM CHLORIDE 0.9 % IV SOLN
Freq: Once | INTRAVENOUS | Status: AC
  Administered 2019-07-05: 12:00:00 via INTRAVENOUS
  Filled 2019-07-05: qty 5

## 2019-07-05 MED ORDER — SODIUM CHLORIDE 0.9 % IV SOLN
200.0000 mg/m2 | Freq: Once | INTRAVENOUS | Status: AC
  Administered 2019-07-05: 13:00:00 492 mg via INTRAVENOUS
  Filled 2019-07-05: qty 82

## 2019-07-05 MED ORDER — PALONOSETRON HCL INJECTION 0.25 MG/5ML
0.2500 mg | Freq: Once | INTRAVENOUS | Status: AC
  Administered 2019-07-05: 11:00:00 0.25 mg via INTRAVENOUS

## 2019-07-05 MED ORDER — DIPHENHYDRAMINE HCL 50 MG/ML IJ SOLN
50.0000 mg | Freq: Once | INTRAMUSCULAR | Status: AC
  Administered 2019-07-05: 50 mg via INTRAVENOUS

## 2019-07-05 MED ORDER — SODIUM CHLORIDE 0.9% FLUSH
10.0000 mL | Freq: Once | INTRAVENOUS | Status: AC
  Administered 2019-07-05: 10 mL

## 2019-07-05 MED ORDER — HEPARIN SOD (PORK) LOCK FLUSH 100 UNIT/ML IV SOLN
500.0000 [IU] | Freq: Once | INTRAVENOUS | Status: AC
  Administered 2019-07-05: 500 [IU] via INTRAVENOUS

## 2019-07-05 MED ORDER — PALONOSETRON HCL INJECTION 0.25 MG/5ML
INTRAVENOUS | Status: AC
  Filled 2019-07-05: qty 5

## 2019-07-05 MED ORDER — SODIUM CHLORIDE 0.9 % IV SOLN
200.0000 mg | Freq: Once | INTRAVENOUS | Status: AC
  Administered 2019-07-05: 12:00:00 200 mg via INTRAVENOUS
  Filled 2019-07-05: qty 8

## 2019-07-05 NOTE — Assessment & Plan Note (Addendum)
  1.  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  She is a current smoker. - Biopsy of the right cervical lymph node consistent with non-small cell cancer, squamous cell. - Palliative XRT to the chest completed on 03/31/2019. - PET scan on 04/04/2019 showed markedly hypermetabolic nodal disease in the right supraclavicular region, right thoracic inlet, high right paratracheal station.  Lymphadenopathy in the right neck, both axilla show low-level FDG accumulation.  Markedly hypermetabolic bilateral adrenal nodules. - 3 cycles of carboplatin, paclitaxel and pembrolizumab from 04/08/2019 through 05/23/2019. - CT CAP on 06/06/2019 shows decrease in the right supraclavicular node measuring 3 x 2.3 cm, compared to 5.3 x 4.6 cm.  Right paratracheal node measures 2.9 x 2.1 cm compared to 6.4 x 4.5 cm.  Resolution of adrenal nodules. - Cycle 4 was on 06/13/2019.  She has tolerated last cycle very well.  She had vomiting for 1 day which was helped with Compazine. -We reviewed her labs today.  She will proceed with cycle 5 of chemotherapy.  We will discontinue chemo after cycle 6 and maintain her on pembrolizumab.  We will plan to repeat scans after cycle 6. - She has a left PICC line and she reports feeling uncomfortable with it.  We will plan to change to Port-A-Cath once her thrombus is resolved. - She will be seen back in 3 weeks for follow-up.  2.  Left basilic vein thrombus: -Ultrasound on 05/03/2019+ for nonocclusive thrombus around the PICC line in the left basilic vein, considered superficial thrombus.  No evidence of DVT. - Because of her active malignancy, full dose anticoagulation was recommended.  She is currently on Eliquis and is tolerating very well.  3.  Right shoulder pain: -She is taking oxycodone 5 mg as needed, mostly at bedtime. -She is also doing physical therapy which is helping her lymphedema of the right upper extremity.

## 2019-07-05 NOTE — Patient Instructions (Signed)
[facility] Discharge Instructions for Patients Receiving Chemotherapy  Today you received the following chemotherapy agents   To help prevent nausea and vomiting after your treatment, we encourage you to take your nausea medication   If you develop nausea and vomiting that is not controlled by your nausea medication, call the clinic.   BELOW ARE SYMPTOMS THAT SHOULD BE REPORTED IMMEDIATELY:  *FEVER GREATER THAN 100.5 F  *CHILLS WITH OR WITHOUT FEVER  NAUSEA AND VOMITING THAT IS NOT CONTROLLED WITH YOUR NAUSEA MEDICATION  *UNUSUAL SHORTNESS OF BREATH  *UNUSUAL BRUISING OR BLEEDING  TENDERNESS IN MOUTH AND THROAT WITH OR WITHOUT PRESENCE OF ULCERS  *URINARY PROBLEMS  *BOWEL PROBLEMS  UNUSUAL RASH Items with * indicate a potential emergency and should be followed up as soon as possible.  Feel free to call the clinic should you have any questions or concerns. The clinic phone number is [phone].  Please show the CHEMO ALERT CARD at check-in to the Emergency Department and triage nurse.   

## 2019-07-05 NOTE — Progress Notes (Signed)
Message received from [facility] LPN proceed with treatment. Labs reviewed and within parameters for treatment. VSS.   HR 108 on arrival. MD aware of HR . Proceed with treatment per MD.    Treatment given today per MD orders. Tolerated infusion without adverse affects. Vital signs stable. No complaints at this time. Discharged from clinic via wheel chair.  F/U with [facility] as scheduled.

## 2019-07-05 NOTE — Patient Instructions (Addendum)
[facility] Discharge Instructions  You were seen today by Dr. Katragadda. He went over your recent lab results. He will see you back in 3 weeks for labs and follow up.   Thank you for choosing [facility] at [facility] to provide your oncology and hematology care.  To afford each patient quality time with our provider, please arrive at least 15 minutes before your scheduled appointment time.   If you have a lab appointment with the Cancer Center please come in thru the  Main Entrance and check in at the main information desk  You need to re-schedule your appointment should you arrive 10 or more minutes late.  We strive to give you quality time with our providers, and arriving late affects you and other patients whose appointments are after yours.  Also, if you no show three or more times for appointments you may be dismissed from the clinic at the providers discretion.     Again, thank you for choosing [facility].  Our hope is that these requests will decrease the amount of time that you wait before being seen by our physicians.       _____________________________________________________________  Should you have questions after your visit to  Cancer Center, please contact our office at [phone] between the hours of 8:00 a.m. and 4:30 p.m.  Voicemails left after 4:00 p.m. will not be returned until the following business day.  For prescription refill requests, have your pharmacy contact our office and allow 72 hours.    Cancer Center Support Programs:   > Cancer Support Group  2nd Tuesday of the month 1pm-2pm, Journey Room    

## 2019-07-05 NOTE — Progress Notes (Signed)
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  CURRENT THERAPY: carboplatin, Taxol and pembrolizumab   BRIEF ONCOLOGIC HISTORY:  Oncology History  Squamous cell lung cancer, unspecified laterality ([place])  04/05/2019 Initial Diagnosis   Squamous cell lung cancer, unspecified laterality ([place])   04/08/2019 -  Chemotherapy   The patient had palonosetron (ALOXI) injection 0.25 mg, 0.25 mg, Intravenous,  Once, 5 of 6 cycles Administration: 0.25 mg (04/08/2019), 0.25 mg (05/02/2019), 0.25 mg (05/23/2019), 0.25 mg (06/13/2019) pegfilgrastim (NEULASTA) injection 6 mg, 6 mg, Subcutaneous, Once, 2 of 2 cycles Administration: 6 mg (04/11/2019), 6 mg (05/04/2019) pegfilgrastim (NEULASTA ONPRO KIT) injection 6 mg, 6 mg, Subcutaneous, Once, 3 of 3 cycles pegfilgrastim-cbqv (UDENYCA) injection 6 mg, 6 mg, Subcutaneous, Once, 3 of 4 cycles Administration: 6 mg (05/25/2019), 6 mg (06/15/2019) CARBOplatin (PARAPLATIN) 900 mg in sodium chloride 0.9 % 500 mL chemo infusion, 900 mg (100 % of original dose 900 mg), Intravenous,  Once, 5 of 6 cycles Dose modification:   (original dose 900 mg, Cycle 1),   (original dose 900 mg, Cycle 2),   (original dose 900 mg, Cycle 3),   (original dose 900 mg, Cycle 4) Administration: 900 mg (04/08/2019), 900 mg (05/02/2019), 900 mg (05/23/2019), 900 mg (06/13/2019) PACLitaxel (TAXOL) 492 mg in sodium chloride 0.9 % 500 mL chemo infusion (> '80mg'$ /m2), 200 mg/m2 = 492 mg, Intravenous,  Once, 5 of 6 cycles Administration: 492 mg (04/08/2019), 492 mg (05/02/2019), 492 mg (05/23/2019), 492 mg (06/13/2019) pembrolizumab (KEYTRUDA) 200 mg in sodium chloride 0.9 % 50 mL chemo infusion, 200 mg, Intravenous, Once, 5 of 6 cycles Administration: 200 mg (04/08/2019), 200 mg (05/02/2019), 200 mg  (05/23/2019), 200 mg (06/13/2019) fosaprepitant (EMEND) 150 mg, dexamethasone (DECADRON) 12 mg in sodium chloride 0.9 % 145 mL IVPB, , Intravenous,  Once, 5 of 6 cycles Administration:  (04/08/2019),  (05/02/2019),  (05/23/2019),  (06/13/2019)  for chemotherapy treatment.        INTERVAL HISTORY:  Ms. Lukehart 38 y.o. female seen for follow-up for cycle 5 of chemotherapy.  Cycle 4 was on 06/13/2019.  She had 1 day of vomiting.  She took Compazine which helped.  Shortness of breath on exertion is stable.  She has been doing physical therapy for her right upper extremity swelling which is helping.  Appetite is 100%.  Energy levels are 25%.  Denies any tingling or numbness in the extremities.  She is having some problems with left PICC line.  She is requesting if we could change it to a port.  No fevers or chills reported.   REVIEW OF SYSTEMS:  Review of Systems  Respiratory: Positive for shortness of breath.   All other systems reviewed and are negative.    PAST MEDICAL/SURGICAL HISTORY:  Past Medical History:  Diagnosis Date  . Depression   . Glucose intolerance 03/11/2019  . Obesity, Class III, BMI 40-49.9 (morbid obesity) ([place]) 03/11/2019   Past Surgical History:  Procedure Laterality Date  . CESAREAN SECTION    . IRRIGATION AND DEBRIDEMENT ABSCESS  03/15/2019   RETROPHARYNGEAL   . TONSILLECTOMY AND ADENOIDECTOMY N/A 03/15/2019   Procedure: IRRIGATION AND DEBRIDEMENT OF RETROPHARYNGEAL ABSCESS;  Surgeon: Leta Baptist, MD;  Location: [facility];  Service: ENT;  Laterality: N/A;     SOCIAL HISTORY:  Social History   Socioeconomic History  . Marital status: Widowed    Spouse name: Not on file  . Number of children: 1  . Years of education: Not on file  . Highest education level: Not on file  Occupational History  . Not on file  Social Needs  . Financial resource strain: Not very hard  . Food insecurity    Worry: Never true    Inability: Never true  . Transportation needs    Medical: No     Non-medical: No  Tobacco Use  . Smoking status: Current Every Day Smoker    Packs/day: 1.00    Years: 25.00    Pack years: 25.00    Types: Cigarettes  . Smokeless tobacco: Never Used  Substance and Sexual Activity  . Alcohol use: Not Currently    Comment: social   . Drug use: Not Currently  . Sexual activity: Not on file  Lifestyle  . Physical activity    Days per week: 0 days    Minutes per session: 0 min  . Stress: Only a little  Relationships  . Social connections    Talks on phone: More than three times a week    Gets together: Twice a week    Attends religious service: Never    Active member of club or organization: No    Attends meetings of clubs or organizations: Never    Relationship status: Widowed  . Intimate partner violence    Fear of current or ex partner: No    Emotionally abused: No    Physically abused: No    Forced sexual activity: No  Other Topics Concern  . Not on file  Social History Narrative   03/19/2019   Husband passed away four months ago from Cancer of the [place].   Pt. Has 71 yo son at home.    FAMILY HISTORY:  Family History  Problem Relation Age of Onset  . Lung cancer Mother   . Hypertension Mother   . Leukemia Father   . Myelodysplastic syndrome Father     CURRENT MEDICATIONS:  Outpatient Encounter Medications as of 07/05/2019  Medication Sig  . buPROPion (WELLBUTRIN XL) 150 MG 24 hr tablet Take 150 mg by mouth daily.  [name] CARBOPLATIN IV Inject into the vein every 21 ( twenty-one) days. X 4 cycles  . Eliquis DVT/PE Starter Pack (ELIQUIS STARTER PACK) 5 MG TABS Take as directed on package: start with two-'5mg'$  tablets twice daily for 7 days. On day 8, switch to one-'5mg'$  tablet twice daily.  . furosemide (LASIX) 20 MG tablet Take 1 tablet (20 mg total) by mouth once as needed for up to 1 dose (Take '20mg'$  once daily as needed).  [name] ibuprofen (ADVIL) 400 MG tablet Take 800 mg by mouth every 6 (six) hours as needed (pain).   . nystatin  (MYCOSTATIN/NYSTOP) powder Apply topically 4 (four) times daily.  [name] oxyCODONE (OXY IR/ROXICODONE) 5 MG immediate release tablet Take 1 tablet (5 mg total) by mouth every 4 (four) hours as needed for severe pain.  [name] PACLitaxel (TAXOL IV) Inject into the vein every 21 ( twenty-one) days. X 4 cycles  . PEMBROLIZUMAB IV Inject into the vein every 21 ( twenty-one) days.  . prochlorperazine (COMPAZINE) 10 MG tablet Take 1 tablet (10 mg total) by mouth every 6 (six) hours as needed (Nausea or vomiting).   No facility-administered encounter medications on file as of 07/05/2019.     ALLERGIES:  No Known Allergies  PHYSICAL EXAM:  ECOG Performance status: 1  There were no vitals filed for this visit. There were no vitals filed for this visit.  Physical Exam Constitutional:      Appearance: She is obese.     Comments: Facial edema   HENT:     Mouth/Throat:     Mouth: Mucous membranes are moist.     Pharynx: Oropharynx is clear.  Eyes:     Extraocular Movements: Extraocular movements intact.     Conjunctiva/sclera: Conjunctivae normal.  Neck:     Musculoskeletal: Normal range of motion.  Cardiovascular:     Rate and Rhythm: Normal rate and regular rhythm.     Heart sounds: Normal heart sounds.  Pulmonary:     Effort: Pulmonary effort is normal.     Breath sounds: Normal breath sounds.  Abdominal:     General: Bowel sounds are normal. There is no distension.     Palpations: There is no mass.  Musculoskeletal: Normal range of motion.  Skin:    General: Skin is warm.  Neurological:     General: No focal deficit present.     Mental Status: She is alert and oriented to person, place, and time.  Psychiatric:        Mood and Affect: Mood normal.        Behavior: Behavior normal.        Thought Content: Thought content normal.        Judgment: Judgment normal.      LABORATORY DATA:  I have reviewed the labs as listed.  CBC    Component Value Date/Time   WBC 4.6 07/05/2019  0915   RBC 3.88 07/05/2019 0915   HGB 11.9 (L) 07/05/2019 0915   HCT 38.3 07/05/2019 0915   PLT 154 07/05/2019 0915   MCV 98.7 07/05/2019 0915   MCH 30.7 07/05/2019 0915   MCHC 31.1 07/05/2019 0915   RDW 16.8 (H) 07/05/2019 0915   LYMPHSABS 0.6 (L) 07/05/2019 0915   MONOABS 0.6 07/05/2019 0915   EOSABS 0.1 07/05/2019 0915   BASOSABS 0.0 07/05/2019 0915   CMP Latest Ref Rng & Units 07/05/2019 06/13/2019 05/23/2019  Glucose 70 - 99 mg/dL 98 100(H) 101(H)  BUN 6 - 20 mg/dL '13 9 14  '$ Creatinine 0.44 - 1.00 mg/dL 0.88 0.65 0.79  Sodium 135 - 145 mmol/L 139 138 136  Potassium 3.5 - 5.1 mmol/L 3.6 3.7 3.3(L)  Chloride 98 - 111 mmol/L 106 106 101  CO2 22 - 32 mmol/L '24 23 26  '$ Calcium 8.9 - 10.3 mg/dL 9.2 9.3 9.2  Total Protein 6.5 - 8.1 g/dL 7.0 7.0 7.1  Total Bilirubin 0.3 - 1.2 mg/dL 0.4 0.4 0.4  Alkaline Phos 38 - 126 U/L 59 53 70  AST 15 - 41 U/L '17 20 29  '$ ALT 0 - 44 U/L 28 33 39      ASSESSMENT & PLAN:   Squamous cell lung cancer, unspecified laterality ([place])  1.  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  She is a current smoker. - Biopsy of the right cervical lymph node consistent with non-small cell cancer, squamous cell. - Palliative XRT to the chest completed on 03/31/2019. - PET scan on 04/04/2019 showed markedly hypermetabolic nodal disease in the right supraclavicular region, right thoracic inlet, high right paratracheal station.  Lymphadenopathy in the right neck, both axilla show low-level FDG accumulation.  Markedly hypermetabolic bilateral adrenal nodules. - 3 cycles of carboplatin,  paclitaxel and pembrolizumab from 04/08/2019 through 05/23/2019. - CT CAP on 06/06/2019 shows decrease in the right supraclavicular node measuring 3 x 2.3 cm, compared to 5.3 x 4.6 cm.  Right paratracheal node measures 2.9 x 2.1 cm compared to 6.4 x 4.5 cm.  Resolution of adrenal nodules. - Cycle 4 was on 06/13/2019.  She has  tolerated last cycle very well.  She had vomiting for 1 day which was helped with Compazine. -We reviewed her labs today.  She will proceed with cycle 5 of chemotherapy.  We will discontinue chemo after cycle 6 and maintain her on pembrolizumab.  We will plan to repeat scans after cycle 6. - She has a left PICC line and she reports feeling uncomfortable with it.  We will plan to change to Port-A-Cath once her thrombus is resolved. - She will be seen back in 3 weeks for follow-up.  2.  Left basilic vein thrombus: -Ultrasound on 05/03/2019+ for nonocclusive thrombus around the PICC line in the left basilic vein, considered superficial thrombus.  No evidence of DVT. - Because of her active malignancy, full dose anticoagulation was recommended.  She is currently on Eliquis and is tolerating very well.  3.  Right shoulder pain: -She is taking oxycodone 5 mg as needed, mostly at bedtime. -She is also doing physical therapy which is helping her lymphedema of the right upper extremity.     Total time spent is 25 minutes with more than 50% of the time spent face-to-face discussing treatment plan, counseling and coordination of care.    Derek Jack, MD [place] [phone]

## 2019-07-05 NOTE — Patient Instructions (Signed)
[place] at [facility] Discharge Instructions  Labs drawn from PICC line today   Thank you for choosing [place] at [facility] to provide your oncology and hematology care.  To afford each patient quality time with our provider, please arrive at least 15 minutes before your scheduled appointment time.   If you have a lab appointment with the [facility] please come in thru the Main Entrance and check in at the main information desk.  You need to re-schedule your appointment should you arrive 10 or more minutes late.  We strive to give you quality time with our providers, and arriving late affects you and other patients whose appointments are after yours.  Also, if you no show three or more times for appointments you may be dismissed from the clinic at the providers discretion.     Again, thank you for choosing [facility]  Our hope is that these requests will decrease the amount of time that you wait before being seen by our physicians.       _____________________________________________________________  Should you have questions after your visit to [facility], please contact our office at [phone] between the hours of 8:00 a.m. and 4:30 p.m.  Voicemails left after 4:00 p.m. will not be returned until the following business day.  For prescription refill requests, have your pharmacy contact our office and allow 72 hours.    Due to Covid, you will need to wear a mask upon entering the hospital. If you do not have a mask, a mask will be given to you at the Main Entrance upon arrival. For doctor visits, patients may have 1 support person with them. For treatment visits, patients can not have anyone with them due to social distancing guidelines and our immunocompromised population.

## 2019-07-06 ENCOUNTER — Telehealth (HOSPITAL_COMMUNITY): Payer: Self-pay | Admitting: Physical Therapy

## 2019-07-06 ENCOUNTER — Ambulatory Visit (HOSPITAL_COMMUNITY): Payer: BC Managed Care – PPO | Admitting: Physical Therapy

## 2019-07-06 NOTE — Telephone Encounter (Signed)
Attempted to call pt re no show.  Unable to leave message on pt phone.  Rayetta Humphrey, [place] CLT [phone]

## 2019-07-07 ENCOUNTER — Ambulatory Visit (HOSPITAL_COMMUNITY): Payer: BC Managed Care – PPO | Admitting: Physical Therapy

## 2019-07-07 ENCOUNTER — Inpatient Hospital Stay (HOSPITAL_COMMUNITY): Payer: BC Managed Care – PPO

## 2019-07-07 ENCOUNTER — Other Ambulatory Visit: Payer: Self-pay

## 2019-07-07 ENCOUNTER — Encounter (HOSPITAL_COMMUNITY): Payer: Self-pay | Admitting: Physical Therapy

## 2019-07-07 VITALS — BP 125/79 | HR 107 | Temp 96.9°F | Resp 20

## 2019-07-07 DIAGNOSIS — I890 Lymphedema, not elsewhere classified: Secondary | ICD-10-CM

## 2019-07-07 DIAGNOSIS — M25611 Stiffness of right shoulder, not elsewhere classified: Secondary | ICD-10-CM

## 2019-07-07 DIAGNOSIS — R293 Abnormal posture: Secondary | ICD-10-CM

## 2019-07-07 DIAGNOSIS — C3490 Malignant neoplasm of unspecified part of unspecified bronchus or lung: Secondary | ICD-10-CM

## 2019-07-07 DIAGNOSIS — Z5112 Encounter for antineoplastic immunotherapy: Secondary | ICD-10-CM | POA: Diagnosis not present

## 2019-07-07 MED ORDER — HEPARIN SOD (PORK) LOCK FLUSH 100 UNIT/ML IV SOLN
500.0000 [IU] | Freq: Once | INTRAVENOUS | Status: AC
  Administered 2019-07-07: 500 [IU] via INTRAVENOUS

## 2019-07-07 MED ORDER — PEGFILGRASTIM-CBQV 6 MG/0.6ML ~~LOC~~ SOSY
6.0000 mg | PREFILLED_SYRINGE | Freq: Once | SUBCUTANEOUS | Status: AC
  Administered 2019-07-07: 6 mg via SUBCUTANEOUS
  Filled 2019-07-07: qty 0.6

## 2019-07-07 MED ORDER — SODIUM CHLORIDE 0.9% FLUSH
10.0000 mL | Freq: Once | INTRAVENOUS | Status: AC
  Administered 2019-07-07: 10 mL

## 2019-07-07 NOTE — Patient Instructions (Signed)
[facility] at [facility]  Discharge Instructions:   _______________________________________________________________  Thank you for choosing [facility] at [facility] to provide your oncology and hematology care.  To afford each patient quality time with our providers, please arrive at least 15 minutes before your scheduled appointment.  You need to re-schedule your appointment if you arrive 10 or more minutes late.  We strive to give you quality time with our providers, and arriving late affects you and other patients whose appointments are after yours.  Also, if you no show three or more times for appointments you may be dismissed from the clinic.  Again, thank you for choosing [facility] at [facility]. Our hope is that these requests will allow you access to exceptional care and in a timely manner. _______________________________________________________________  If you have questions after your visit, please contact our office at [phone] between the hours of 8:30 a.m. and 5:00 p.m. Voicemails left after 4:30 p.m. will not be returned until the following business day. _______________________________________________________________  For prescription refill requests, have your pharmacy contact our office. _______________________________________________________________  Recommendations made by the consultant and any test results will be sent to your referring physician. _______________________________________________________________ 

## 2019-07-07 NOTE — Progress Notes (Signed)
Tara Aguilar presents today for injection per MD orders. Udenyca administered SQ in right Abdomen. Administration without incident. Patient tolerated well.  Vital signs stable. No complaints at this time. Discharged from clinic via wheel chair. F/U with [facility] as scheduled.

## 2019-07-07 NOTE — Therapy (Signed)
[place], [place], [address] Phone: [phone]   Fax:  [phone]  Physical Therapy Treatment  Patient Details  Name: Tara Aguilar MRN: [id number] Date of Birth: Dec 11, 1980 Referring Provider (PT): Shona Simpson    Encounter Date: 07/07/2019  PT End of Session - 07/07/19 0948    Visit Number  10    Number of Visits  16    Date for PT Re-Evaluation  07/28/19    Authorization - Visit Number  10    Authorization - Number of Visits  16    Activity Tolerance  Patient tolerated treatment well    Behavior During Therapy  [facility] for tasks assessed/performed       Past Medical History:  Diagnosis Date  . Depression   . Glucose intolerance 03/11/2019  . Obesity, Class III, BMI 40-49.9 (morbid obesity) ([place]) 03/11/2019    Past Surgical History:  Procedure Laterality Date  . CESAREAN SECTION    . IRRIGATION AND DEBRIDEMENT ABSCESS  03/15/2019   RETROPHARYNGEAL   . TONSILLECTOMY AND ADENOIDECTOMY N/A 03/15/2019   Procedure: IRRIGATION AND DEBRIDEMENT OF RETROPHARYNGEAL ABSCESS;  Surgeon: Leta Baptist, MD;  Location: [facility];  Service: ENT;  Laterality: N/A;    There were no vitals filed for this visit.  Subjective Assessment - 07/07/19 0943    Subjective  PT states that she missed last session due to her mother surprising her from [place].    Pertinent History  cancer    Patient Stated Goals  less swelling better abilty to move                       [facility] Adult PT Treatment/Exercise - 07/07/19 0001      Posture/Postural Control   Posture/Postural Control  Postural limitations    Postural Limitations  Rounded Shoulders;Forward head    Posture Comments  pt obese with overhanging anterior abdomen and diffuse swelling in upper body       Exercises   Exercises  Neck      Neck Exercises: Theraband   Scapula Retraction  5 reps;Red    Shoulder Extension  5 reps;Red    Rows  5 reps;Red      Neck Exercises:  Standing   Upper Extremity Flexion with Stabilization  Flexion;10 reps    Other Standing Exercises  abduction x 10       Manual Therapy   Manual Therapy  Manual Lymphatic Drainage (MLD);Compression Bandaging    Manual therapy comments  completed seperately from all other skilled interventions    Manual Lymphatic Drainage (MLD)  To include chest and neck, and facial. supraclavicular, deep and superfical abdominal, and B UE both anterior and posteriorly routing fluid using axillary-inguinal anastomosis.      Compression Bandaging  unable due to pt forgetting compression bandages              PT Education - 07/07/19 0947    Education Details  HEP for tband postural exercises with red t-band given to pt    Person(s) Educated  Patient    Methods  Explanation;Demonstration;Handout    Comprehension  Returned demonstration;Need further instruction          PT Long Term Goals - 06/14/19 1132      PT LONG TERM GOAL #1   Title  Pt will have decrease of circumference of neck at 8 cm proximal to sternal notch by 2 cm    Baseline  51  cm on 05/18/2019    Time  4    Period  Weeks    Status  On-going      PT LONG TERM GOAL #2   Title  Pt will be independent in diaphragmatic breathing and self MLD to help control lymphedema at home.    Time  4    Period  Weeks    Status  Achieved      PT LONG TERM GOAL #3   Title  Pt will report the discomfort she feels from swelling had decreased by 25%    Status  Achieved      PT LONG TERM GOAL #4   Title  Pt will be indepenedent in a HEP for remedial exercise of right shoulder and neck AROM    Time  4    Period  Weeks    Status  Achieved            Plan - 07/07/19 0949    Clinical Impression Statement  PT states that she has not done as much self manual as she usually does secondary to being fatigued and sick with chemo as well as a surprise visit from her mother.  Noted increased chest, anteriror as well as posterior ,congestion  therefore extra time was taken in this area.  PT posture continues to be poor limiting lymphatic flow therefore therapist instructed pt in theraband postrual exercises.    Personal Factors and Comorbidities  Comorbidity 3+    Comorbidities  recent radiation, recent dental absess, recent radiation to chest, ongoing chemo    Examination-Activity Limitations  Dressing;Bathing    Stability/Clinical Decision Making  Evolving/Moderate complexity    Rehab Potential  Good    PT Frequency  3x / week    PT Duration  4 weeks    PT Treatment/Interventions  ADLs/Self Care Home Management;DME Instruction;Therapeutic activities;Therapeutic exercise;Patient/family education;Orthotic Fit/Training;Manual techniques;Manual lymph drainage;Passive range of motion;Taping    PT Next Visit Plan  [place] tomorrow due to pt not being seen at for a week due to being ill.    PT Home Exercise Plan  7/29:  AROM all motions cervical in seated, bil UE flexion/abduction AROM    Consulted and Agree with Plan of Care  Patient       Patient will benefit from skilled therapeutic intervention in order to improve the following deficits and impairments:  Decreased skin integrity, Impaired perceived functional ability, Increased edema, Decreased range of motion, Decreased knowledge of precautions, Decreased activity tolerance, Decreased knowledge of use of DME, Decreased strength, Increased fascial restricitons, Impaired flexibility, Impaired UE functional use  Visit Diagnosis: Stiffness of right shoulder, not elsewhere classified  Lymphedema, not elsewhere classified  Abnormal posture     Problem List Patient Active Problem List   Diagnosis Date Noted  . 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 Squamous cell lung cancer, unspecified laterality ([place]) 04/05/2019  . Goals of care, counseling/discussion 04/05/2019  . 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 Glucose intolerance 03/11/2019  . Obesity, Class III, BMI 40-49.9 (morbid obesity) ([place]) 03/11/2019  . Tobacco use 03/11/2019  . Dental abscess 03/07/2019  . 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 Depression     Rayetta Humphrey, [place] CLT [phone] 07/07/2019, 9:55 AM  [place],  [place], [address] Phone: [phone]   Fax:  [phone]  Name: Kerra Guilfoil MRN: [id number] Date of Birth: 1981/08/07

## 2019-07-08 ENCOUNTER — Ambulatory Visit (HOSPITAL_COMMUNITY): Payer: BC Managed Care – PPO | Admitting: Physical Therapy

## 2019-07-11 ENCOUNTER — Other Ambulatory Visit: Payer: Self-pay

## 2019-07-11 ENCOUNTER — Inpatient Hospital Stay (HOSPITAL_COMMUNITY): Payer: BC Managed Care – PPO

## 2019-07-11 DIAGNOSIS — Z5112 Encounter for antineoplastic immunotherapy: Secondary | ICD-10-CM | POA: Diagnosis not present

## 2019-07-11 MED ORDER — SODIUM CHLORIDE 0.9% FLUSH
20.0000 mL | Freq: Once | INTRAVENOUS | Status: AC
  Administered 2019-07-11: 20 mL via INTRAVENOUS

## 2019-07-11 MED ORDER — HEPARIN SOD (PORK) LOCK FLUSH 100 UNIT/ML IV SOLN
500.0000 [IU] | Freq: Once | INTRAVENOUS | Status: AC
  Administered 2019-07-11: 500 [IU] via INTRAVENOUS

## 2019-07-11 NOTE — Progress Notes (Signed)
Tara Aguilar presents today for PICC line flush and dressing change. See IV flowsheet and MAR for details. VSS. Pt discharged in satisfactory condition with follow up instructions.

## 2019-07-11 NOTE — Patient Instructions (Signed)
[place] at [facility]  Discharge Instructions:  PICC line flushed and dressing changed today. Follow up as previously scheduled. _______________________________________________________________  Thank you for choosing [place] at [facility] to provide your oncology and hematology care.  To afford each patient quality time with our providers, please arrive at least 15 minutes before your scheduled appointment.  You need to re-schedule your appointment if you arrive 10 or more minutes late.  We strive to give you quality time with our providers, and arriving late affects you and other patients whose appointments are after yours.  Also, if you no show three or more times for appointments you may be dismissed from the clinic.  Again, thank you for choosing [place] at [facility] hope is that these requests will allow you access to exceptional care and in a timely manner. _______________________________________________________________  If you have questions after your visit, please contact our office at [phone] between the hours of 8:30 a.m. and 5:00 p.m. Voicemails left after 4:30 p.m. will not be returned until the following business day. _______________________________________________________________  For prescription refill requests, have your pharmacy contact our office. _______________________________________________________________  Recommendations made by the consultant and any test results will be sent to your referring physician. _______________________________________________________________

## 2019-07-12 ENCOUNTER — Ambulatory Visit (HOSPITAL_COMMUNITY): Payer: BC Managed Care – PPO | Admitting: Physical Therapy

## 2019-07-14 ENCOUNTER — Ambulatory Visit (HOSPITAL_COMMUNITY): Payer: BC Managed Care – PPO | Admitting: Physical Therapy

## 2019-07-15 ENCOUNTER — Telehealth (HOSPITAL_COMMUNITY): Payer: Self-pay | Admitting: Physical Therapy

## 2019-07-15 NOTE — Telephone Encounter (Signed)
PT mailbox is full, unable to leave a message re: missed appointments this week.  If pt misses next scheduled visit we will send her a discharge letter.   Rayetta Humphrey, [place] CLT [phone]

## 2019-07-19 ENCOUNTER — Other Ambulatory Visit: Payer: Self-pay

## 2019-07-19 ENCOUNTER — Inpatient Hospital Stay (HOSPITAL_COMMUNITY): Payer: BC Managed Care – PPO

## 2019-07-19 ENCOUNTER — Encounter (HOSPITAL_COMMUNITY): Payer: Self-pay

## 2019-07-19 ENCOUNTER — Encounter (HOSPITAL_COMMUNITY): Payer: Self-pay | Admitting: Physical Therapy

## 2019-07-19 ENCOUNTER — Ambulatory Visit (HOSPITAL_COMMUNITY): Payer: BC Managed Care – PPO | Admitting: Physical Therapy

## 2019-07-19 DIAGNOSIS — M25611 Stiffness of right shoulder, not elsewhere classified: Secondary | ICD-10-CM | POA: Diagnosis not present

## 2019-07-19 DIAGNOSIS — Z5112 Encounter for antineoplastic immunotherapy: Secondary | ICD-10-CM | POA: Diagnosis not present

## 2019-07-19 DIAGNOSIS — I890 Lymphedema, not elsewhere classified: Secondary | ICD-10-CM

## 2019-07-19 DIAGNOSIS — R293 Abnormal posture: Secondary | ICD-10-CM

## 2019-07-19 MED ORDER — SODIUM CHLORIDE 0.9% FLUSH
10.0000 mL | INTRAVENOUS | Status: DC | PRN
  Administered 2019-07-19: 3 mL via INTRAVENOUS
  Filled 2019-07-19: qty 10

## 2019-07-19 MED ORDER — HEPARIN SOD (PORK) LOCK FLUSH 100 UNIT/ML IV SOLN
250.0000 [IU] | Freq: Once | INTRAVENOUS | Status: AC
  Administered 2019-07-19: 250 [IU] via INTRAVENOUS

## 2019-07-19 NOTE — Progress Notes (Signed)
Nani Skillern tolerated PICC line flush with dressing change per protocol well without complaints or incident. PICC line site without swelling,redness or drainage noted. VSS Pt discharged via wheelchair in satisfactory condition

## 2019-07-19 NOTE — Therapy (Signed)
[place], [place], [address] Phone: [phone]   Fax:  [phone]  Physical Therapy Treatment  Patient Details  Name: Tara Aguilar MRN: [id number] Date of Birth: [birth year] Referring Provider (PT): Shona Simpson    Encounter Date: 07/19/2019  PT End of Session - 07/19/19 1626    Visit Number  11    Number of Visits  16    Date for PT Re-Evaluation  07/28/19    Authorization - Visit Number  11    Authorization - Number of Visits  16    PT Start Time  6283    PT Stop Time  1543    PT Time Calculation (min)  55 min    Activity Tolerance  Patient tolerated treatment well    Behavior During Therapy  [facility] for tasks assessed/performed       Past Medical History:  Diagnosis Date  . Depression   . Glucose intolerance 03/11/2019  . Obesity, Class III, BMI 40-49.9 (morbid obesity) ([place]) 03/11/2019    Past Surgical History:  Procedure Laterality Date  . CESAREAN SECTION    . IRRIGATION AND DEBRIDEMENT ABSCESS  03/15/2019   RETROPHARYNGEAL   . TONSILLECTOMY AND ADENOIDECTOMY N/A 03/15/2019   Procedure: IRRIGATION AND DEBRIDEMENT OF RETROPHARYNGEAL ABSCESS;  Surgeon: Leta Baptist, MD;  Location: [place];  Service: ENT;  Laterality: N/A;    There were no vitals filed for this visit.  Subjective Assessment - 07/19/19 1623    Subjective  PT has not been to treatment since 07/07/2019, states that chemo made her very sick last week but she is trying to get back to her normal routine this week.    Pertinent History  cancer    Patient Stated Goals  less swelling better abilty to move    Currently in Pain?  No/denies            LYMPHEDEMA/ONCOLOGY QUESTIONNAIRE - 07/19/19 1450      Right Upper Extremity Lymphedema   10 cm Proximal to Olecranon Process  40.5 cm    Olecranon Process  33.3 cm    15 cm Proximal to Ulnar Styloid Process  30.4 cm    Just Proximal to Ulnar Styloid Process  19 cm    Across Hand at [employer]  23 cm    At [place] of 2nd Digit  7 cm      Left Upper Extremity Lymphedema   10 cm Proximal to Olecranon Process  42.2 cm    Olecranon Process  32 cm    15 cm Proximal to Ulnar Styloid Process  28.2 cm    Just Proximal to Ulnar Styloid Process  18.3 cm    Across Hand at [employer]  23.3 cm    At [place] of 2nd Digit  7.2 cm      Head and Neck   4 cm superior to sternal notch around neck  51.6 cm    6 cm superior to sternal notch around neck  53.8 cm    8 cm superior to sternal notch around neck  54 cm                [facility] Adult PT Treatment/Exercise - 07/19/19 0001      Posture/Postural Control   Posture/Postural Control  Postural limitations    Postural Limitations  Rounded Shoulders;Forward head    Posture Comments  pt obese with overhanging anterior abdomen and diffuse swelling in upper body  Exercises   Exercises  Neck      Neck Exercises: Theraband   Scapula Retraction  --    Shoulder Extension  --    Rows  --      Neck Exercises: Standing   Upper Extremity Flexion with Stabilization  --    Other Standing Exercises  abduction x 10       Neck Exercises: Supine   Other Supine Exercise  scapular and cervical retraction x 10 then combined x 10       Manual Therapy   Manual Therapy  Manual Lymphatic Drainage (MLD);Compression Bandaging    Manual therapy comments  completed seperately from all other skilled interventions    Manual Lymphatic Drainage (MLD)  To include chest and neck, and facial. supraclavicular, deep and superfical abdominal, and B UE both anterior and posteriorly routing fluid using axillary-inguinal anastomosis.      Compression Bandaging  unable due to pt forgetting compression bandages                   PT Long Term Goals - 06/14/19 1132      PT LONG TERM GOAL #1   Title  Pt will have decrease of circumference of neck at 8 cm proximal to sternal notch by 2 cm    Baseline  51 cm on 05/18/2019    Time  4    Period   Weeks    Status  On-going      PT LONG TERM GOAL #2   Title  Pt will be independent in diaphragmatic breathing and self MLD to help control lymphedema at home.    Time  4    Period  Weeks    Status  Achieved      PT LONG TERM GOAL #3   Title  Pt will report the discomfort she feels from swelling had decreased by 25%    Status  Achieved      PT LONG TERM GOAL #4   Title  Pt will be indepenedent in a HEP for remedial exercise of right shoulder and neck AROM    Time  4    Period  Weeks    Status  Achieved            Plan - 07/19/19 1626    Clinical Impression Statement  Pt reassessed with significant increase of edema since she has been gone.  Pt has not completed self manual either due to being so fatigued and sick with chemo.  Noted increased breast induration with manual.  Therapist stressed the importance of pt doing her postural exercises.    Personal Factors and Comorbidities  Comorbidity 3+    Comorbidities  recent radiation, recent dental absess, recent radiation to chest, ongoing chemo    Examination-Activity Limitations  Dressing;Bathing    Stability/Clinical Decision Making  Evolving/Moderate complexity    Rehab Potential  Good    PT Frequency  3x / week    PT Duration  4 weeks    PT Treatment/Interventions  ADLs/Self Care Home Management;DME Instruction;Therapeutic activities;Therapeutic exercise;Patient/family education;Orthotic Fit/Training;Manual techniques;Manual lymph drainage;Passive range of motion;Taping    PT Next Visit Plan  Continue with manual for chest, head, neck and UE edema.    PT Home Exercise Plan  7/29:  AROM all motions cervical in seated, bil UE flexion/abduction AROM    Consulted and Agree with Plan of Care  Patient       Patient will benefit from skilled therapeutic intervention in order to  improve the following deficits and impairments:  Decreased skin integrity, Impaired perceived functional ability, Increased edema, Decreased range of  motion, Decreased knowledge of precautions, Decreased activity tolerance, Decreased knowledge of use of DME, Decreased strength, Increased fascial restricitons, Impaired flexibility, Impaired UE functional use  Visit Diagnosis: Lymphedema, not elsewhere classified  Stiffness of right shoulder, not elsewhere classified  Abnormal posture     Problem List Patient Active Problem List   Diagnosis Date Noted  . Secondary and unspecified malignant neoplasm of intrathoracic lymph nodes ([place]) 06/09/2019  . Squamous cell lung cancer, unspecified laterality ([place]) 04/05/2019  . Goals of care, counseling/discussion 04/05/2019  . 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 Glucose intolerance 03/11/2019  . Obesity, Class III, BMI 40-49.9 (morbid obesity) ([place]) 03/11/2019  . Tobacco use 03/11/2019  . Dental abscess 03/07/2019  . 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 Depression    Rayetta Humphrey, [place] CLT [phone] 07/19/2019, 4:29 PM  [place] [address] [place], [place], [address] Phone: [phone]   Fax:  [phone]  Name: Tara Aguilar MRN: [id number] Date of Birth: 1980/12/03

## 2019-07-19 NOTE — Patient Instructions (Signed)
[place] at [facility] Discharge Instructions  PICC line flushed today with dressing change. Follow-up as scheduled. Call clinic for any questions or concerns   Thank you for choosing [place] at [facility] to provide your oncology and hematology care.  To afford each patient quality time with our provider, please arrive at least 15 minutes before your scheduled appointment time.   If you have a lab appointment with the [place] please come in thru the Main Entrance and check in at the main information desk.  You need to re-schedule your appointment should you arrive 10 or more minutes late.  We strive to give you quality time with our providers, and arriving late affects you and other patients whose appointments are after yours.  Also, if you no show three or more times for appointments you may be dismissed from the clinic at the providers discretion.     Again, thank you for choosing [facility].  Our hope is that these requests will decrease the amount of time that you wait before being seen by our physicians.       _____________________________________________________________  Should you have questions after your visit to [facility], please contact our office at [phone] between the hours of 8:00 a.m. and 4:30 p.m.  Voicemails left after 4:00 p.m. will not be returned until the following business day.  For prescription refill requests, have your pharmacy contact our office and allow 72 hours.    Due to Covid, you will need to wear a mask upon entering the hospital. If you do not have a mask, a mask will be given to you at the Main Entrance upon arrival. For doctor visits, patients may have 1 support person with them. For treatment visits, patients can not have anyone with them due to social distancing guidelines and our immunocompromised population.

## 2019-07-21 ENCOUNTER — Ambulatory Visit (HOSPITAL_COMMUNITY): Payer: BC Managed Care – PPO | Admitting: Physical Therapy

## 2019-07-26 ENCOUNTER — Other Ambulatory Visit: Payer: Self-pay

## 2019-07-26 ENCOUNTER — Inpatient Hospital Stay (HOSPITAL_BASED_OUTPATIENT_CLINIC_OR_DEPARTMENT_OTHER): Payer: BC Managed Care – PPO | Admitting: Hematology

## 2019-07-26 ENCOUNTER — Inpatient Hospital Stay (HOSPITAL_COMMUNITY): Payer: BC Managed Care – PPO

## 2019-07-26 ENCOUNTER — Telehealth (HOSPITAL_COMMUNITY): Payer: Self-pay | Admitting: Physical Therapy

## 2019-07-26 ENCOUNTER — Ambulatory Visit (HOSPITAL_COMMUNITY): Payer: BC Managed Care – PPO | Admitting: Physical Therapy

## 2019-07-26 VITALS — BP 115/55 | HR 95 | Temp 98.4°F | Resp 20

## 2019-07-26 VITALS — BP 132/86 | HR 100 | Temp 97.0°F | Resp 20 | Wt 259.4 lb

## 2019-07-26 DIAGNOSIS — C3490 Malignant neoplasm of unspecified part of unspecified bronchus or lung: Secondary | ICD-10-CM

## 2019-07-26 DIAGNOSIS — B379 Candidiasis, unspecified: Secondary | ICD-10-CM | POA: Diagnosis not present

## 2019-07-26 DIAGNOSIS — Z5112 Encounter for antineoplastic immunotherapy: Secondary | ICD-10-CM | POA: Diagnosis not present

## 2019-07-26 DIAGNOSIS — Z0000 Encounter for general adult medical examination without abnormal findings: Secondary | ICD-10-CM

## 2019-07-26 LAB — CBC WITH DIFFERENTIAL/PLATELET
Abs Immature Granulocytes: 0.02 10*3/uL (ref 0.00–0.07)
Basophils Absolute: 0 10*3/uL (ref 0.0–0.1)
Basophils Relative: 1 %
Eosinophils Absolute: 0.1 10*3/uL (ref 0.0–0.5)
Eosinophils Relative: 2 %
HCT: 39.8 % (ref 36.0–46.0)
Hemoglobin: 12.6 g/dL (ref 12.0–15.0)
Immature Granulocytes: 1 %
Lymphocytes Relative: 16 %
Lymphs Abs: 0.7 10*3/uL (ref 0.7–4.0)
MCH: 31 pg (ref 26.0–34.0)
MCHC: 31.7 g/dL (ref 30.0–36.0)
MCV: 97.8 fL (ref 80.0–100.0)
Monocytes Absolute: 0.5 10*3/uL (ref 0.1–1.0)
Monocytes Relative: 13 %
Neutro Abs: 2.8 10*3/uL (ref 1.7–7.7)
Neutrophils Relative %: 67 %
Platelets: 113 10*3/uL — ABNORMAL LOW (ref 150–400)
RBC: 4.07 MIL/uL (ref 3.87–5.11)
RDW: 16.4 % — ABNORMAL HIGH (ref 11.5–15.5)
WBC: 4 10*3/uL (ref 4.0–10.5)
nRBC: 0 % (ref 0.0–0.2)

## 2019-07-26 LAB — COMPREHENSIVE METABOLIC PANEL
ALT: 34 U/L (ref 0–44)
AST: 23 U/L (ref 15–41)
Albumin: 3.8 g/dL (ref 3.5–5.0)
Alkaline Phosphatase: 63 U/L (ref 38–126)
Anion gap: 9 (ref 5–15)
BUN: 12 mg/dL (ref 6–20)
CO2: 25 mmol/L (ref 22–32)
Calcium: 9.5 mg/dL (ref 8.9–10.3)
Chloride: 104 mmol/L (ref 98–111)
Creatinine, Ser: 0.88 mg/dL (ref 0.44–1.00)
GFR calc Af Amer: >60 mL/min (ref >60)
GFR calc non Af Amer: >60 mL/min (ref >60)
Glucose, Bld: 115 mg/dL — ABNORMAL HIGH (ref 70–99)
Potassium: 3.9 mmol/L (ref 3.5–5.1)
Sodium: 138 mmol/L (ref 135–145)
Total Bilirubin: 0.4 mg/dL (ref 0.3–1.2)
Total Protein: 7.3 g/dL (ref 6.5–8.1)

## 2019-07-26 MED ORDER — FAMOTIDINE IN NACL 20-0.9 MG/50ML-% IV SOLN
INTRAVENOUS | Status: AC
  Filled 2019-07-26: qty 50

## 2019-07-26 MED ORDER — INFLUENZA VAC SPLIT QUAD 0.5 ML IM SUSY
0.5000 mL | PREFILLED_SYRINGE | Freq: Once | INTRAMUSCULAR | Status: AC
  Administered 2019-07-26: 0.5 mL via INTRAMUSCULAR
  Filled 2019-07-26: qty 0.5

## 2019-07-26 MED ORDER — DIPHENHYDRAMINE HCL 50 MG/ML IJ SOLN
INTRAMUSCULAR | Status: AC
  Filled 2019-07-26: qty 1

## 2019-07-26 MED ORDER — PALONOSETRON HCL INJECTION 0.25 MG/5ML
INTRAVENOUS | Status: AC
  Filled 2019-07-26: qty 5

## 2019-07-26 MED ORDER — SODIUM CHLORIDE 0.9 % IV SOLN
Freq: Once | INTRAVENOUS | Status: AC
  Administered 2019-07-26: 10:00:00 via INTRAVENOUS

## 2019-07-26 MED ORDER — SODIUM CHLORIDE 0.9 % IV SOLN
200.0000 mg | Freq: Once | INTRAVENOUS | Status: AC
  Administered 2019-07-26: 200 mg via INTRAVENOUS
  Filled 2019-07-26: qty 8

## 2019-07-26 MED ORDER — HEPARIN SOD (PORK) LOCK FLUSH 100 UNIT/ML IV SOLN
250.0000 [IU] | Freq: Once | INTRAVENOUS | Status: AC | PRN
  Administered 2019-07-26: 250 [IU]

## 2019-07-26 MED ORDER — PALONOSETRON HCL INJECTION 0.25 MG/5ML
0.2500 mg | Freq: Once | INTRAVENOUS | Status: AC
  Administered 2019-07-26: 0.25 mg via INTRAVENOUS

## 2019-07-26 MED ORDER — FAMOTIDINE IN NACL 20-0.9 MG/50ML-% IV SOLN
20.0000 mg | Freq: Once | INTRAVENOUS | Status: AC
  Administered 2019-07-26: 20 mg via INTRAVENOUS

## 2019-07-26 MED ORDER — DIPHENHYDRAMINE HCL 50 MG/ML IJ SOLN
50.0000 mg | Freq: Once | INTRAMUSCULAR | Status: AC
  Administered 2019-07-26: 50 mg via INTRAVENOUS

## 2019-07-26 MED ORDER — SODIUM CHLORIDE 0.9 % IV SOLN
Freq: Once | INTRAVENOUS | Status: AC
  Administered 2019-07-26: 11:00:00 via INTRAVENOUS
  Filled 2019-07-26: qty 5

## 2019-07-26 MED ORDER — SODIUM CHLORIDE 0.9 % IV SOLN
900.0000 mg | Freq: Once | INTRAVENOUS | Status: AC
  Administered 2019-07-26: 900 mg via INTRAVENOUS
  Filled 2019-07-26: qty 90

## 2019-07-26 MED ORDER — SODIUM CHLORIDE 0.9% FLUSH
10.0000 mL | INTRAVENOUS | Status: DC | PRN
  Administered 2019-07-26: 10 mL
  Filled 2019-07-26: qty 10

## 2019-07-26 MED ORDER — SODIUM CHLORIDE 0.9 % IV SOLN
175.0000 mg/m2 | Freq: Once | INTRAVENOUS | Status: AC
  Administered 2019-07-26: 432 mg via INTRAVENOUS
  Filled 2019-07-26: qty 72

## 2019-07-26 NOTE — Progress Notes (Signed)
Pt presents today for treatment and f/u visit with Dr. Delton Coombes. Pt requests flu vaccine today. Labs drawn and pending. PICC dressing change performed. VS within parameters for treatment. Pt has no complaints of pain today. Pt states in the last week she has experienced a slight numbness and tingling in her toes and fingers bilateral. Pt states that it does not affect her ADL's.   Labs within parameters for treatment.   Treatment given today per MD orders. Tolerated infusion without adverse affects. Vital signs stable. No complaints at this time. Discharged from clinic via wheel chair.F/U with [facility] as scheduled.

## 2019-07-26 NOTE — Telephone Encounter (Signed)
Called PT re missed appointment.  She is in chemo at this time and is not able to make her appointment.  Rayetta Humphrey, [place] CLT [phone]

## 2019-07-26 NOTE — Patient Instructions (Addendum)
[place] at [facility] Discharge Instructions  You were seen today by Dr. Delton Coombes. He went over your recent lab results. He will see you back in 3 weeks  for labs, treatment and follow up.   Thank you for choosing [place] at [facility] to provide your oncology and hematology care.  To afford each patient quality time with our provider, please arrive at least 15 minutes before your scheduled appointment time.   If you have a lab appointment with the [place] please come in thru the  Main Entrance and check in at the main information desk  You need to re-schedule your appointment should you arrive 10 or more minutes late.  We strive to give you quality time with our providers, and arriving late affects you and other patients whose appointments are after yours.  Also, if you no show three or more times for appointments you may be dismissed from the clinic at the providers discretion.     Again, thank you for choosing [facility].  Our hope is that these requests will decrease the amount of time that you wait before being seen by our physicians.       _____________________________________________________________  Should you have questions after your visit to [facility], please contact our office at [phone] between the hours of 8:00 a.m. and 4:30 p.m.  Voicemails left after 4:00 p.m. will not be returned until the following business day.  For prescription refill requests, have your pharmacy contact our office and allow 72 hours.    Cancer Center Support Programs:   > Cancer Support Group  2nd Tuesday of the month 1pm-2pm, Journey Room

## 2019-07-26 NOTE — Patient Instructions (Signed)
[facility] Discharge Instructions for Patients Receiving Chemotherapy  Today you received the following chemotherapy agents   To help prevent nausea and vomiting after your treatment, we encourage you to take your nausea medication   If you develop nausea and vomiting that is not controlled by your nausea medication, call the clinic.   BELOW ARE SYMPTOMS THAT SHOULD BE REPORTED IMMEDIATELY:  *FEVER GREATER THAN 100.5 F  *CHILLS WITH OR WITHOUT FEVER  NAUSEA AND VOMITING THAT IS NOT CONTROLLED WITH YOUR NAUSEA MEDICATION  *UNUSUAL SHORTNESS OF BREATH  *UNUSUAL BRUISING OR BLEEDING  TENDERNESS IN MOUTH AND THROAT WITH OR WITHOUT PRESENCE OF ULCERS  *URINARY PROBLEMS  *BOWEL PROBLEMS  UNUSUAL RASH Items with * indicate a potential emergency and should be followed up as soon as possible.  Feel free to call the clinic should you have any questions or concerns. The clinic phone number is [phone].  Please show the CHEMO ALERT CARD at check-in to the Emergency Department and triage nurse.   

## 2019-07-28 ENCOUNTER — Ambulatory Visit (HOSPITAL_COMMUNITY): Payer: BC Managed Care – PPO | Attending: Internal Medicine | Admitting: Physical Therapy

## 2019-07-28 ENCOUNTER — Other Ambulatory Visit (HOSPITAL_COMMUNITY): Payer: Self-pay | Admitting: *Deleted

## 2019-07-28 ENCOUNTER — Other Ambulatory Visit: Payer: Self-pay

## 2019-07-28 ENCOUNTER — Other Ambulatory Visit (HOSPITAL_COMMUNITY): Payer: Self-pay | Admitting: Nurse Practitioner

## 2019-07-28 ENCOUNTER — Inpatient Hospital Stay (HOSPITAL_COMMUNITY): Payer: BC Managed Care – PPO | Attending: Hematology

## 2019-07-28 VITALS — BP 120/84 | HR 100 | Temp 97.8°F | Resp 20

## 2019-07-28 DIAGNOSIS — Z7901 Long term (current) use of anticoagulants: Secondary | ICD-10-CM | POA: Diagnosis not present

## 2019-07-28 DIAGNOSIS — I890 Lymphedema, not elsewhere classified: Secondary | ICD-10-CM | POA: Diagnosis not present

## 2019-07-28 DIAGNOSIS — G629 Polyneuropathy, unspecified: Secondary | ICD-10-CM | POA: Diagnosis not present

## 2019-07-28 DIAGNOSIS — R5383 Other fatigue: Secondary | ICD-10-CM | POA: Insufficient documentation

## 2019-07-28 DIAGNOSIS — Z79899 Other long term (current) drug therapy: Secondary | ICD-10-CM | POA: Insufficient documentation

## 2019-07-28 DIAGNOSIS — R05 Cough: Secondary | ICD-10-CM | POA: Diagnosis not present

## 2019-07-28 DIAGNOSIS — Z452 Encounter for adjustment and management of vascular access device: Secondary | ICD-10-CM | POA: Insufficient documentation

## 2019-07-28 DIAGNOSIS — M25511 Pain in right shoulder: Secondary | ICD-10-CM | POA: Insufficient documentation

## 2019-07-28 DIAGNOSIS — Z5112 Encounter for antineoplastic immunotherapy: Secondary | ICD-10-CM | POA: Insufficient documentation

## 2019-07-28 DIAGNOSIS — C779 Secondary and unspecified malignant neoplasm of lymph node, unspecified: Secondary | ICD-10-CM

## 2019-07-28 DIAGNOSIS — Z5189 Encounter for other specified aftercare: Secondary | ICD-10-CM | POA: Insufficient documentation

## 2019-07-28 DIAGNOSIS — R0602 Shortness of breath: Secondary | ICD-10-CM | POA: Insufficient documentation

## 2019-07-28 DIAGNOSIS — Z5111 Encounter for antineoplastic chemotherapy: Secondary | ICD-10-CM | POA: Insufficient documentation

## 2019-07-28 DIAGNOSIS — R293 Abnormal posture: Secondary | ICD-10-CM | POA: Diagnosis present

## 2019-07-28 DIAGNOSIS — C3490 Malignant neoplasm of unspecified part of unspecified bronchus or lung: Secondary | ICD-10-CM

## 2019-07-28 DIAGNOSIS — F17210 Nicotine dependence, cigarettes, uncomplicated: Secondary | ICD-10-CM | POA: Diagnosis not present

## 2019-07-28 DIAGNOSIS — M25611 Stiffness of right shoulder, not elsewhere classified: Secondary | ICD-10-CM | POA: Insufficient documentation

## 2019-07-28 MED ORDER — PEGFILGRASTIM-CBQV 6 MG/0.6ML ~~LOC~~ SOSY
PREFILLED_SYRINGE | SUBCUTANEOUS | Status: AC
  Filled 2019-07-28: qty 0.6

## 2019-07-28 MED ORDER — PEGFILGRASTIM-CBQV 6 MG/0.6ML ~~LOC~~ SOSY
6.0000 mg | PREFILLED_SYRINGE | Freq: Once | SUBCUTANEOUS | Status: AC
  Administered 2019-07-28: 6 mg via SUBCUTANEOUS

## 2019-07-28 MED ORDER — APIXABAN 5 MG PO TABS: 5.0000 mg | ORAL_TABLET | Freq: Two times a day (BID) | ORAL | 2 refills | Status: DC

## 2019-07-28 MED ORDER — GABAPENTIN 300 MG PO CAPS: 300.0000 mg | ORAL_CAPSULE | Freq: Two times a day (BID) | ORAL | 1 refills | Status: DC

## 2019-07-28 NOTE — Progress Notes (Signed)
Pt presents today for Udenyca injection. VSS. Pt has complaints of numbness in her hands. Spoke with RLockamy NP pertaining to pt's request of Neurontin 300mg  PO script. Medication order sent by [facility] NP.

## 2019-07-28 NOTE — Therapy (Signed)
[place], [place], [address] Phone: [phone]   Fax:  [phone]  Physical Therapy Treatment  Patient Details  Name: Tara Aguilar MRN: [id number] Date of Birth: 1980-12-29 Referring Provider (PT): Shona Simpson    Encounter Date: 07/28/2019  PT End of Session - 07/28/19 1623    Visit Number  12    Number of Visits  24    Date for PT Re-Evaluation  07/28/19    Authorization - Visit Number  12    Authorization - Number of Visits  24    PT Start Time  1520    PT Stop Time  1600    PT Time Calculation (min)  40 min    Activity Tolerance  Patient tolerated treatment well    Behavior During Therapy  [facility] for tasks assessed/performed       Past Medical History:  Diagnosis Date  . Depression   . Glucose intolerance 03/11/2019  . Obesity, Class III, BMI 40-49.9 (morbid obesity) ([place]) 03/11/2019    Past Surgical History:  Procedure Laterality Date  . CESAREAN SECTION    . IRRIGATION AND DEBRIDEMENT ABSCESS  03/15/2019   RETROPHARYNGEAL   . TONSILLECTOMY AND ADENOIDECTOMY N/A 03/15/2019   Procedure: IRRIGATION AND DEBRIDEMENT OF RETROPHARYNGEAL ABSCESS;  Surgeon: Leta Baptist, MD;  Location: [place];  Service: ENT;  Laterality: N/A;    There were no vitals filed for this visit.  Subjective Assessment - 07/28/19 1523    Subjective  PT states that she had her last big chemo two days ago, she started getting peripheral neuropathy in her arms and legs last week.  She has been dropping.    Pertinent History  cancer    Patient Stated Goals  less swelling better abilty to move    Currently in Pain?  Yes    Pain Score  5     Pain Location  Hand    Pain Descriptors / Indicators  Tingling         [facility] PT Assessment - 07/28/19 0001      Assessment   Medical Diagnosis  lung cancer with lymphedema     Referring Provider (PT)  Shona Simpson     Onset Date/Surgical Date  03/22/19    Hand Dominance  Right      Restrictions   Weight Bearing Restrictions  No      Home Environment   Living Environment  Private residence    [place]   lives with 54 yo son    Available Help at Discharge  Family;Friend(s);Available PRN/intermittently      Prior Function   Level of Independence  Independent   limted by fatigue    Vocation  --   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 Pt with PICC at left upper arm that is full with edema surrounding the site.      Skin Integrity  darkened skin at chest both breasts       Sensation   Light Touch  Not tested  Coordination   Gross Motor Movements are Fluid and Coordinated  Yes   pt moves slowly      Posture/Postural Control   Posture/Postural Control  Postural limitations    Postural Limitations  Rounded Shoulders;Forward head    Posture Comments  pt obese with overhanging anterior abdomen and diffuse swelling in upper body       AROM   Overall AROM Comments  --    Right Shoulder Extension  --    Right Shoulder Flexion  --    Right Shoulder ABduction  --      Palpation   Palpation comment  --        LYMPHEDEMA/ONCOLOGY QUESTIONNAIRE - 07/28/19 1526      Right Upper Extremity Lymphedema   10 cm Proximal to Olecranon Process  62.6 cm   last cert date on 9/1 was 40   Olecranon Process  94.8 cm   last cert date on 9/1 was 31   15 cm Proximal to Ulnar Styloid Process  27.4 cm   28.8   Just Proximal to Ulnar Styloid Process  17.8 cm   18.8   Across Hand at [employer]  22.5 cm   22.2   At [place] of 2nd Digit  7.2 cm   6.5     Left Upper Extremity Lymphedema   10 cm  Proximal to Olecranon Process  40 cm   last cert date 9/1 =54.6   Olecranon Process  31 cm   30.7   15 cm Proximal to Ulnar Styloid Process  28.8 cm   28.5   Just Proximal to Ulnar Styloid Process  18.4 cm   17.8   Across Hand at [employer]  21.8 cm   21.8   At [place] of 2nd Digit  7 cm   6.3     Head and Neck   4 cm superior to sternal notch around neck  50.8 cm   was 51.3   6 cm superior to sternal notch around neck  50.2 cm   was 52   8 cm superior to sternal notch around neck  51.6 cm   ws 53               [facility] Adult PT Treatment/Exercise - 07/28/19 0001      Exercises   Exercises  Neck      Neck Exercises: Standing   Other Standing Exercises  --      Neck Exercises: Supine   Other Supine Exercise  scapular and cervical retraction x 10 then combined x 10       Manual Therapy   Manual Therapy  Manual Lymphatic Drainage (MLD);Compression Bandaging    Manual therapy comments  completed seperately from all other skilled interventions    Manual Lymphatic Drainage (MLD)  To include chest and neck, and facial. supraclavicular, deep and superfical abdominal, and B UE both anterior and posteriorly routing fluid using axillary-inguinal anastomosis.      Compression Bandaging  For Rt UE using foam and multilayer shortstretch bandages from fingers to shoulder. Unable to bandage Lt due to port.                   PT Long Term Goals - 06/14/19 1132      PT LONG TERM GOAL #1   Title  Pt will have decrease of circumference of neck at 8 cm proximal to sternal notch by 2 cm    Baseline  51 cm on  05/18/2019    Time  4    Period  Weeks    Status  On-going      PT LONG TERM GOAL #2   Title  Pt will be independent in diaphragmatic breathing and self MLD to help control lymphedema at home.    Time  4    Period  Weeks    Status  Achieved      PT LONG TERM GOAL #3   Title  Pt will report the discomfort she feels from swelling had decreased by 25%    Status   Achieved      PT LONG TERM GOAL #4   Title  Pt will be indepenedent in a HEP for remedial exercise of right shoulder and neck AROM    Time  4    Period  Weeks    Status  Achieved            Plan - 07/28/19 1624    Clinical Impression Statement  PT has not been coming to therapy on a consistant basis due to having Chemo and feeling ill.  She states that she has been feeling so bad that she has not been doing her exercises.  She has been putting on her compression on a more regular basis.  Pt measured today; today's measurments compared to the begining of last cert which demonstrates decreased cervical edema, decreased volume in RT UE but increased in Lt.  Therapist explained that pt cert is up and it should not be renewed if she can not come on a consistant basis.  PT states that chemo is now decreasing to once every three weeks so she hopes to be at therapy on a more regular basis.    Personal Factors and Comorbidities  Comorbidity 3+    Comorbidities  recent radiation, recent dental absess, recent radiation to chest, ongoing chemo    Examination-Activity Limitations  Dressing;Bathing    Stability/Clinical Decision Making  Evolving/Moderate complexity    Rehab Potential  Good    PT Frequency  3x / week    PT Duration  4 weeks    PT Treatment/Interventions  ADLs/Self Care Home Management;DME Instruction;Therapeutic activities;Therapeutic exercise;Patient/family education;Orthotic Fit/Training;Manual techniques;Manual lymph drainage;Passive range of motion;Taping    PT Next Visit Plan  Continue with manual for chest, head, neck and UE edema.    PT Home Exercise Plan  7/29:  AROM all motions cervical in seated, bil UE flexion/abduction AROM    Consulted and Agree with Plan of Care  Patient       Patient will benefit from skilled therapeutic intervention in order to improve the following deficits and impairments:  Decreased skin integrity, Impaired perceived functional ability, Increased  edema, Decreased range of motion, Decreased knowledge of precautions, Decreased activity tolerance, Decreased knowledge of use of DME, Decreased strength, Increased fascial restricitons, Impaired flexibility, Impaired UE functional use  Visit Diagnosis: Lymphedema, not elsewhere classified  Stiffness of right shoulder, not elsewhere classified  Abnormal posture     Problem List Patient Active Problem List   Diagnosis Date Noted  . Secondary and unspecified malignant neoplasm of intrathoracic lymph nodes ([place]) 06/09/2019  . Squamous cell lung cancer, unspecified laterality ([place]) 04/05/2019  . Goals of care, counseling/discussion 04/05/2019  . 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 Squamous cell carcinoma of lymph node ([place]) 03/17/2019  . 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 Glucose intolerance 03/11/2019  . Obesity, Class III, BMI 40-49.9 (morbid obesity) ([place]) 03/11/2019  .  Tobacco use 03/11/2019  . Dental abscess 03/07/2019  . 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 Depression     Rayetta Humphrey, [place] CLT [phone] 07/28/2019, 4:39 PM  [place] [address] [place], [place], [address] Phone: [phone]   Fax:  [phone]  Name: Renia Mikelson MRN: [id number] Date of Birth: 08-20-1981

## 2019-08-01 ENCOUNTER — Ambulatory Visit (HOSPITAL_COMMUNITY): Payer: BC Managed Care – PPO | Admitting: Physical Therapy

## 2019-08-01 ENCOUNTER — Other Ambulatory Visit (HOSPITAL_COMMUNITY): Payer: Self-pay | Admitting: Nurse Practitioner

## 2019-08-01 DIAGNOSIS — C779 Secondary and unspecified malignant neoplasm of lymph node, unspecified: Secondary | ICD-10-CM

## 2019-08-01 DIAGNOSIS — C3490 Malignant neoplasm of unspecified part of unspecified bronchus or lung: Secondary | ICD-10-CM

## 2019-08-02 ENCOUNTER — Other Ambulatory Visit: Payer: Self-pay

## 2019-08-02 ENCOUNTER — Encounter (HOSPITAL_COMMUNITY): Payer: Self-pay

## 2019-08-02 ENCOUNTER — Inpatient Hospital Stay (HOSPITAL_COMMUNITY): Payer: BC Managed Care – PPO

## 2019-08-02 DIAGNOSIS — Z452 Encounter for adjustment and management of vascular access device: Secondary | ICD-10-CM | POA: Diagnosis not present

## 2019-08-02 MED ORDER — OXYCODONE HCL 5 MG PO TABS: 5.0000 mg | ORAL_TABLET | ORAL | 0 refills | Status: DC | PRN

## 2019-08-02 MED ORDER — HEPARIN SOD (PORK) LOCK FLUSH 100 UNIT/ML IV SOLN
250.0000 [IU] | Freq: Once | INTRAVENOUS | Status: AC
  Administered 2019-08-02: 250 [IU] via INTRAVENOUS

## 2019-08-02 MED ORDER — SODIUM CHLORIDE 0.9% FLUSH
10.0000 mL | INTRAVENOUS | Status: DC | PRN
  Administered 2019-08-02: 10 mL via INTRAVENOUS
  Filled 2019-08-02: qty 10

## 2019-08-03 ENCOUNTER — Encounter (HOSPITAL_COMMUNITY): Payer: Self-pay | Admitting: Physical Therapy

## 2019-08-03 ENCOUNTER — Ambulatory Visit (HOSPITAL_COMMUNITY): Payer: BC Managed Care – PPO | Admitting: Physical Therapy

## 2019-08-03 DIAGNOSIS — I890 Lymphedema, not elsewhere classified: Secondary | ICD-10-CM | POA: Diagnosis not present

## 2019-08-03 DIAGNOSIS — R293 Abnormal posture: Secondary | ICD-10-CM

## 2019-08-03 DIAGNOSIS — M25611 Stiffness of right shoulder, not elsewhere classified: Secondary | ICD-10-CM

## 2019-08-03 NOTE — Therapy (Signed)
[place], [place], [address] Phone: [phone]   Fax:  [phone]  Physical Therapy Treatment  Patient Details  Name: Tara Aguilar MRN: [id number] Date of Birth: [birth year] Referring Provider (PT): Shona Simpson    Encounter Date: 08/03/2019  PT End of Session - 08/03/19 1423    Visit Number  13    Number of Visits  24    Date for PT Re-Evaluation  07/28/19    Authorization - Visit Number  13    Authorization - Number of Visits  24    PT Start Time  1320    PT Stop Time  1415    PT Time Calculation (min)  55 min    Activity Tolerance  Patient tolerated treatment well    Behavior During Therapy  [facility] for tasks assessed/performed       Past Medical History:  Diagnosis Date  . Depression   . Glucose intolerance 03/11/2019  . Obesity, Class III, BMI 40-49.9 (morbid obesity) ([place]) 03/11/2019    Past Surgical History:  Procedure Laterality Date  . CESAREAN SECTION    . IRRIGATION AND DEBRIDEMENT ABSCESS  03/15/2019   RETROPHARYNGEAL   . TONSILLECTOMY AND ADENOIDECTOMY N/A 03/15/2019   Procedure: IRRIGATION AND DEBRIDEMENT OF RETROPHARYNGEAL ABSCESS;  Surgeon: Leta Baptist, MD;  Location: [facility];  Service: ENT;  Laterality: N/A;    There were no vitals filed for this visit.  Subjective Assessment - 08/03/19 1418    Subjective  Pt states that the peripheral neuropathy is bothersome.  She is havintg to take gabapentin for it with some relief.    Pertinent History  cancer    Patient Stated Goals  less swelling better abilty to move    Currently in Pain?  Yes    Pain Score  5    hands and feet         [facility] Adult PT Treatment/Exercise - 08/03/19 0001      Posture/Postural Control   Posture/Postural Control  Postural limitations    Postural Limitations  Rounded Shoulders;Forward head    Posture Comments  pt obese with overhanging anterior abdomen and diffuse swelling in upper body       Exercises   Exercises  Neck   Bilateral shld flexion x 5; ER x 10 scapular retraction x 10     Neck Exercises: Supine   Other Supine Exercise  --      Manual Therapy   Manual Therapy  Manual Lymphatic Drainage (MLD);Compression Bandaging    Manual therapy comments  completed seperately from all other skilled interventions    Manual Lymphatic Drainage (MLD)  To include chest and neck, and facial. supraclavicular, deep and superfical abdominal, and B UE both anterior and posteriorly routing fluid using axillary-inguinal anastomosis.      Compression Bandaging  --   unable due to pt forgettng her bandages.            PT Long Term Goals - 06/14/19 1132      PT LONG TERM GOAL #1   Title  Pt will have decrease of circumference of neck at 8 cm proximal to sternal notch by 2 cm    Baseline  51 cm on 05/18/2019    Time  4    Period  Weeks    Status  On-going      PT LONG TERM GOAL #2   Title  Pt will be independent in diaphragmatic breathing and self MLD to  help control lymphedema at home.    Time  4    Period  Weeks    Status  Achieved      PT LONG TERM GOAL #3   Title  Pt will report the discomfort she feels from swelling had decreased by 25%    Status  Achieved      PT LONG TERM GOAL #4   Title  Pt will be indepenedent in a HEP for remedial exercise of right shoulder and neck AROM    Time  4    Period  Weeks    Status  Achieved            Plan - 08/03/19 1423    Clinical Impression Statement  Noted increased induration in breasts with today's manual.  Pt states that she has been continuing to complete the self manual daily.  PT continues to improve in shoulder ROM>    Personal Factors and Comorbidities  Comorbidity 3+    Comorbidities  recent radiation, recent dental absess, recent radiation to chest, ongoing chemo    Examination-Activity Limitations  Dressing;Bathing    Stability/Clinical Decision Making  Evolving/Moderate complexity    Rehab Potential  Good    PT Frequency   3x / week    PT Duration  4 weeks    PT Treatment/Interventions  ADLs/Self Care Home Management;DME Instruction;Therapeutic activities;Therapeutic exercise;Patient/family education;Orthotic Fit/Training;Manual techniques;Manual lymph drainage;Passive range of motion;Taping    PT Next Visit Plan  Continue with manual for chest, head, neck and UE edema measure weekly.    PT Home Exercise Plan  7/29:  AROM all motions cervical in seated, bil UE flexion/abduction AROM    Consulted and Agree with Plan of Care  Patient       Patient will benefit from skilled therapeutic intervention in order to improve the following deficits and impairments:  Decreased skin integrity, Impaired perceived functional ability, Increased edema, Decreased range of motion, Decreased knowledge of precautions, Decreased activity tolerance, Decreased knowledge of use of DME, Decreased strength, Increased fascial restricitons, Impaired flexibility, Impaired UE functional use  Visit Diagnosis: Lymphedema, not elsewhere classified  Stiffness of right shoulder, not elsewhere classified  Abnormal posture     Problem List Patient Active Problem List   Diagnosis Date Noted  . 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 Squamous cell lung cancer, unspecified laterality ([place]) 04/05/2019  . Goals of care, counseling/discussion 04/05/2019  . 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 Glucose intolerance 03/11/2019  . Obesity, Class III, BMI 40-49.9 (morbid obesity) ([place]) 03/11/2019  . Tobacco use 03/11/2019  . Dental abscess 03/07/2019  . 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 Depression    Rayetta Humphrey, [place] CLT [phone] 08/03/2019, 2:26 PM  [place], [place], [address] Phone: [phone]   Fax:  [phone]  Name: Tara Aguilar MRN: [id number] Date of Birth: [birth year]

## 2019-08-05 ENCOUNTER — Encounter (HOSPITAL_COMMUNITY): Payer: BC Managed Care – PPO | Admitting: Physical Therapy

## 2019-08-08 ENCOUNTER — Encounter (HOSPITAL_COMMUNITY): Payer: Self-pay

## 2019-08-08 ENCOUNTER — Ambulatory Visit (HOSPITAL_COMMUNITY): Payer: BC Managed Care – PPO | Admitting: Physical Therapy

## 2019-08-09 ENCOUNTER — Other Ambulatory Visit: Payer: Self-pay

## 2019-08-09 ENCOUNTER — Inpatient Hospital Stay (HOSPITAL_COMMUNITY): Payer: BC Managed Care – PPO

## 2019-08-09 ENCOUNTER — Encounter (HOSPITAL_COMMUNITY): Payer: Self-pay

## 2019-08-09 DIAGNOSIS — Z452 Encounter for adjustment and management of vascular access device: Secondary | ICD-10-CM | POA: Diagnosis not present

## 2019-08-09 MED ORDER — SODIUM CHLORIDE 0.9% FLUSH
10.0000 mL | Freq: Once | INTRAVENOUS | Status: AC
  Administered 2019-08-09: 10 mL via INTRAVENOUS

## 2019-08-09 MED ORDER — HEPARIN SOD (PORK) LOCK FLUSH 100 UNIT/ML IV SOLN
500.0000 [IU] | Freq: Once | INTRAVENOUS | Status: AC
  Administered 2019-08-09: 300 [IU] via INTRAVENOUS

## 2019-08-09 NOTE — Progress Notes (Signed)
Tara Aguilar tolerated PICC line flush with dressing change well without complaints or incident. PICC line site without redness or drainage noted. VSS Pt discharged via wheelchair in satisfactory condition

## 2019-08-09 NOTE — Patient Instructions (Signed)
[place] at [facility] Discharge Instructions  PICC line flushed with dressing and cap changed per protocol. Follow-up as scheduled. Call clinic for any questions or concerns   Thank you for choosing [place] at [facility] to provide your oncology and hematology care.  To afford each patient quality time with our provider, please arrive at least 15 minutes before your scheduled appointment time.   If you have a lab appointment with the [facility] please come in thru the Main Entrance and check in at the main information desk.  You need to re-schedule your appointment should you arrive 10 or more minutes late.  We strive to give you quality time with our providers, and arriving late affects you and other patients whose appointments are after yours.  Also, if you no show three or more times for appointments you may be dismissed from the clinic at the providers discretion.     Again, thank you for choosing [facility].  Our hope is that these requests will decrease the amount of time that you wait before being seen by our physicians.       _____________________________________________________________  Should you have questions after your visit to [facility], please contact our office at [phone] between the hours of 8:00 a.m. and 4:30 p.m.  Voicemails left after 4:00 p.m. will not be returned until the following business day.  For prescription refill requests, have your pharmacy contact our office and allow 72 hours.    Due to Covid, you will need to wear a mask upon entering the hospital. If you do not have a mask, a mask will be given to you at the Main Entrance upon arrival. For doctor visits, patients may have 1 support person with them. For treatment visits, patients can not have anyone with them due to social distancing guidelines and our immunocompromised population.

## 2019-08-10 ENCOUNTER — Encounter (HOSPITAL_COMMUNITY): Payer: Self-pay | Admitting: Physical Therapy

## 2019-08-10 ENCOUNTER — Ambulatory Visit (HOSPITAL_COMMUNITY): Payer: BC Managed Care – PPO | Admitting: Physical Therapy

## 2019-08-10 DIAGNOSIS — I890 Lymphedema, not elsewhere classified: Secondary | ICD-10-CM

## 2019-08-10 DIAGNOSIS — M25611 Stiffness of right shoulder, not elsewhere classified: Secondary | ICD-10-CM

## 2019-08-10 DIAGNOSIS — R293 Abnormal posture: Secondary | ICD-10-CM

## 2019-08-10 NOTE — Therapy (Signed)
[place], [place], [address] Phone: [phone]   Fax:  [phone]  Physical Therapy Treatment  Patient Details  Name: Tara Aguilar MRN: [id number] Date of Birth: [birth year] Referring Provider (PT): Shona Simpson    Encounter Date: 08/10/2019  PT End of Session - 08/10/19 1647    Visit Number  14    Number of Visits  24    Date for PT Re-Evaluation  07/28/19    Authorization Type  PT cert ends on 65/99.    Authorization - Visit Number  14    Authorization - Number of Visits  24    PT Start Time  3570    PT Stop Time  1555    PT Time Calculation (min)  57 min    Activity Tolerance  Patient tolerated treatment well    Behavior During Therapy  [facility] for tasks assessed/performed       Past Medical History:  Diagnosis Date  . Depression   . Glucose intolerance 03/11/2019  . Obesity, Class III, BMI 40-49.9 (morbid obesity) ([place]) 03/11/2019    Past Surgical History:  Procedure Laterality Date  . CESAREAN SECTION    . IRRIGATION AND DEBRIDEMENT ABSCESS  03/15/2019   RETROPHARYNGEAL   . TONSILLECTOMY AND ADENOIDECTOMY N/A 03/15/2019   Procedure: IRRIGATION AND DEBRIDEMENT OF RETROPHARYNGEAL ABSCESS;  Surgeon: Leta Baptist, MD;  Location: [facility];  Service: ENT;  Laterality: N/A;    There were no vitals filed for this visit.  Subjective Assessment - 08/10/19 1644    Subjective  PT states she really wants to come to treatment regularly but she fell asleep yesterday and she will not be able to make Friday's appointment due to going on a mini-family vacation.    Pertinent History  cancer    Patient Stated Goals  less swelling better abilty to move                       [facility] Adult PT Treatment/Exercise - 08/10/19 0001      Posture/Postural Control   Posture/Postural Control  Postural limitations    Postural Limitations  Rounded Shoulders;Forward head    Posture Comments  pt obese with overhanging  anterior abdomen and diffuse swelling in upper body       Exercises   Exercises  Neck   Bilateral shld flexion x 5; ER x 10 scapular retraction x 10     Manual Therapy   Manual Therapy  Manual Lymphatic Drainage (MLD);Compression Bandaging    Manual therapy comments  completed seperately from all other skilled interventions    Manual Lymphatic Drainage (MLD)  To include chest and neck, and facial. supraclavicular, deep and superfical abdominal, and B UE both anterior and posteriorly routing fluid using axillary-inguinal anastomosis.      Compression Bandaging  for Rt UE using foam and multilayer compression bandages, unable to bandage Lt due to port line.                   PT Long Term Goals - 06/14/19 1132      PT LONG TERM GOAL #1   Title  Pt will have decrease of circumference of neck at 8 cm proximal to sternal notch by 2 cm    Baseline  51 cm on 05/18/2019    Time  4    Period  Weeks    Status  On-going      PT LONG TERM GOAL #  2   Title  Pt will be independent in diaphragmatic breathing and self MLD to help control lymphedema at home.    Time  4    Period  Weeks    Status  Achieved      PT LONG TERM GOAL #3   Title  Pt will report the discomfort she feels from swelling had decreased by 25%    Status  Achieved      PT LONG TERM GOAL #4   Title  Pt will be indepenedent in a HEP for remedial exercise of right shoulder and neck AROM    Time  4    Period  Weeks    Status  Achieved            Plan - 08/10/19 1647    Clinical Impression Statement  Pt with bandages today, therefore compression bandaging was able to be completed to RT UE, LT has pic in it.  PT  breasts still have minimal induration but improved since last visit.  PT will continue to benefit from skilled physical therapy for manual to reduce edema.    Personal Factors and Comorbidities  Comorbidity 3+    Comorbidities  recent radiation, recent dental absess, recent radiation to chest, ongoing  chemo    Examination-Activity Limitations  Dressing;Bathing    Stability/Clinical Decision Making  Evolving/Moderate complexity    Rehab Potential  Good    PT Frequency  3x / week    PT Duration  4 weeks    PT Treatment/Interventions  ADLs/Self Care Home Management;DME Instruction;Therapeutic activities;Therapeutic exercise;Patient/family education;Orthotic Fit/Training;Manual techniques;Manual lymph drainage;Passive range of motion;Taping    PT Next Visit Plan  Continue with manual for chest, head, neck and UE edema measure next visit    PT Home Exercise Plan  7/29:  AROM all motions cervical in seated, bil UE flexion/abduction AROM    Consulted and Agree with Plan of Care  Patient       Patient will benefit from skilled therapeutic intervention in order to improve the following deficits and impairments:  Decreased skin integrity, Impaired perceived functional ability, Increased edema, Decreased range of motion, Decreased knowledge of precautions, Decreased activity tolerance, Decreased knowledge of use of DME, Decreased strength, Increased fascial restricitons, Impaired flexibility, Impaired UE functional use  Visit Diagnosis: Lymphedema, not elsewhere classified  Stiffness of right shoulder, not elsewhere classified  Abnormal posture     Problem List Patient Active Problem List   Diagnosis Date Noted  . Secondary and unspecified malignant neoplasm of intrathoracic lymph nodes ([place]) 06/09/2019  . Squamous cell lung cancer, unspecified laterality ([place]) 04/05/2019  . Goals of care, counseling/discussion 04/05/2019  . 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 Glucose intolerance 03/11/2019  . Obesity, Class III, BMI 40-49.9 (morbid obesity) ([place]) 03/11/2019  . Tobacco use 03/11/2019  . Dental abscess 03/07/2019  . 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 Depression     Rayetta Humphrey, [place] CLT [phone] 08/10/2019, 4:51 PM  [place] [address] [place], [place], [address] Phone: [phone]   Fax:  [phone]  Name: Tara Aguilar MRN: [id number] Date of Birth: 12-Mar-1981

## 2019-08-11 ENCOUNTER — Ambulatory Visit (HOSPITAL_COMMUNITY)
Admission: RE | Admit: 2019-08-11 | Discharge: 2019-08-11 | Disposition: A | Discharge status: Home / Self Care | Payer: BC Managed Care – PPO | Source: Ambulatory Visit | Attending: Hematology | Admitting: Hematology

## 2019-08-11 ENCOUNTER — Other Ambulatory Visit: Payer: Self-pay

## 2019-08-11 DIAGNOSIS — C3490 Malignant neoplasm of unspecified part of unspecified bronchus or lung: Secondary | ICD-10-CM | POA: Diagnosis present

## 2019-08-11 MED ORDER — IOHEXOL 300 MG/ML  SOLN
100.0000 mL | Freq: Once | INTRAMUSCULAR | Status: AC | PRN
  Administered 2019-08-11: 100 mL via INTRAVENOUS

## 2019-08-12 ENCOUNTER — Ambulatory Visit (HOSPITAL_COMMUNITY): Payer: BC Managed Care – PPO | Admitting: Physical Therapy

## 2019-08-15 ENCOUNTER — Ambulatory Visit (HOSPITAL_COMMUNITY): Payer: BC Managed Care – PPO | Admitting: Physical Therapy

## 2019-08-15 ENCOUNTER — Telehealth (HOSPITAL_COMMUNITY): Payer: Self-pay | Admitting: Physical Therapy

## 2019-08-15 NOTE — Telephone Encounter (Signed)
1st NO show.    Called pt re no show.  Mailbox is full; unable to leave a message.  PT had a family reunion in [place] last weekend, assume pt is still at reunion or is traveling home.   Rayetta Humphrey, [place] CLT [phone]

## 2019-08-16 ENCOUNTER — Other Ambulatory Visit (HOSPITAL_COMMUNITY): Payer: BC Managed Care – PPO

## 2019-08-16 ENCOUNTER — Other Ambulatory Visit: Payer: Self-pay

## 2019-08-16 ENCOUNTER — Inpatient Hospital Stay (HOSPITAL_COMMUNITY): Payer: BC Managed Care – PPO

## 2019-08-16 ENCOUNTER — Inpatient Hospital Stay (HOSPITAL_BASED_OUTPATIENT_CLINIC_OR_DEPARTMENT_OTHER): Payer: BC Managed Care – PPO | Admitting: Nurse Practitioner

## 2019-08-16 ENCOUNTER — Encounter (HOSPITAL_COMMUNITY): Payer: Self-pay | Admitting: Nurse Practitioner

## 2019-08-16 VITALS — BP 146/83 | HR 96 | Temp 98.4°F | Resp 18

## 2019-08-16 DIAGNOSIS — C3490 Malignant neoplasm of unspecified part of unspecified bronchus or lung: Secondary | ICD-10-CM

## 2019-08-16 DIAGNOSIS — Z452 Encounter for adjustment and management of vascular access device: Secondary | ICD-10-CM | POA: Diagnosis not present

## 2019-08-16 LAB — CBC WITH DIFFERENTIAL/PLATELET
Abs Immature Granulocytes: 0.02 10*3/uL (ref 0.00–0.07)
Basophils Absolute: 0 10*3/uL (ref 0.0–0.1)
Basophils Relative: 1 %
Eosinophils Absolute: 0 10*3/uL (ref 0.0–0.5)
Eosinophils Relative: 1 %
HCT: 37.3 % (ref 36.0–46.0)
Hemoglobin: 11.5 g/dL — ABNORMAL LOW (ref 12.0–15.0)
Immature Granulocytes: 1 %
Lymphocytes Relative: 15 %
Lymphs Abs: 0.6 10*3/uL — ABNORMAL LOW (ref 0.7–4.0)
MCH: 31.9 pg (ref 26.0–34.0)
MCHC: 30.8 g/dL (ref 30.0–36.0)
MCV: 103.6 fL — ABNORMAL HIGH (ref 80.0–100.0)
Monocytes Absolute: 0.4 10*3/uL (ref 0.1–1.0)
Monocytes Relative: 10 %
Neutro Abs: 3 10*3/uL (ref 1.7–7.7)
Neutrophils Relative %: 72 %
Platelets: 119 10*3/uL — ABNORMAL LOW (ref 150–400)
RBC: 3.6 MIL/uL — ABNORMAL LOW (ref 3.87–5.11)
RDW: 17.2 % — ABNORMAL HIGH (ref 11.5–15.5)
WBC: 4.1 10*3/uL (ref 4.0–10.5)
nRBC: 0 % (ref 0.0–0.2)

## 2019-08-16 LAB — COMPREHENSIVE METABOLIC PANEL
ALT: 25 U/L (ref 0–44)
AST: 19 U/L (ref 15–41)
Albumin: 3.6 g/dL (ref 3.5–5.0)
Alkaline Phosphatase: 70 U/L (ref 38–126)
Anion gap: 9 (ref 5–15)
BUN: 14 mg/dL (ref 6–20)
CO2: 24 mmol/L (ref 22–32)
Calcium: 8.8 mg/dL — ABNORMAL LOW (ref 8.9–10.3)
Chloride: 108 mmol/L (ref 98–111)
Creatinine, Ser: 1.02 mg/dL — ABNORMAL HIGH (ref 0.44–1.00)
GFR calc Af Amer: >60 mL/min (ref >60)
GFR calc non Af Amer: >60 mL/min (ref >60)
Glucose, Bld: 157 mg/dL — ABNORMAL HIGH (ref 70–99)
Potassium: 3.9 mmol/L (ref 3.5–5.1)
Sodium: 141 mmol/L (ref 135–145)
Total Bilirubin: 0.4 mg/dL (ref 0.3–1.2)
Total Protein: 6.8 g/dL (ref 6.5–8.1)

## 2019-08-16 MED ORDER — SODIUM CHLORIDE 0.9% FLUSH
10.0000 mL | INTRAVENOUS | Status: DC | PRN
  Administered 2019-08-16: 10 mL
  Filled 2019-08-16: qty 10

## 2019-08-16 MED ORDER — SODIUM CHLORIDE 0.9 % IV SOLN
175.0000 mg/m2 | Freq: Once | INTRAVENOUS | Status: AC
  Administered 2019-08-16: 432 mg via INTRAVENOUS
  Filled 2019-08-16: qty 72

## 2019-08-16 MED ORDER — SODIUM CHLORIDE 0.9 % IV SOLN
900.0000 mg | Freq: Once | INTRAVENOUS | Status: AC
  Administered 2019-08-16: 900 mg via INTRAVENOUS
  Filled 2019-08-16: qty 90

## 2019-08-16 MED ORDER — FAMOTIDINE IN NACL 20-0.9 MG/50ML-% IV SOLN
20.0000 mg | Freq: Once | INTRAVENOUS | Status: AC
  Administered 2019-08-16: 20 mg via INTRAVENOUS
  Filled 2019-08-16: qty 50

## 2019-08-16 MED ORDER — PALONOSETRON HCL INJECTION 0.25 MG/5ML
0.2500 mg | Freq: Once | INTRAVENOUS | Status: AC
  Administered 2019-08-16: 0.25 mg via INTRAVENOUS
  Filled 2019-08-16: qty 5

## 2019-08-16 MED ORDER — SODIUM CHLORIDE 0.9 % IV SOLN
Freq: Once | INTRAVENOUS | Status: AC
  Administered 2019-08-16: 12:00:00 via INTRAVENOUS
  Filled 2019-08-16: qty 5

## 2019-08-16 MED ORDER — DIPHENHYDRAMINE HCL 50 MG/ML IJ SOLN
50.0000 mg | Freq: Once | INTRAMUSCULAR | Status: AC
  Administered 2019-08-16: 50 mg via INTRAVENOUS
  Filled 2019-08-16: qty 1

## 2019-08-16 MED ORDER — HEPARIN SOD (PORK) LOCK FLUSH 100 UNIT/ML IV SOLN
500.0000 [IU] | Freq: Once | INTRAVENOUS | Status: AC | PRN
  Administered 2019-08-16: 500 [IU]

## 2019-08-16 MED ORDER — SODIUM CHLORIDE 0.9 % IV SOLN
INTRAVENOUS | Status: AC
  Administered 2019-08-16: 11:00:00 via INTRAVENOUS

## 2019-08-16 MED ORDER — GABAPENTIN 300 MG PO CAPS
300.0000 mg | ORAL_CAPSULE | Freq: Once | ORAL | Status: AC
  Administered 2019-08-16: 300 mg via ORAL
  Filled 2019-08-16: qty 1

## 2019-08-16 MED ORDER — SODIUM CHLORIDE 0.9 % IV SOLN
200.0000 mg | Freq: Once | INTRAVENOUS | Status: AC
  Administered 2019-08-16: 200 mg via INTRAVENOUS
  Filled 2019-08-16: qty 8

## 2019-08-16 MED ORDER — SODIUM CHLORIDE 0.9 % IV SOLN
Freq: Once | INTRAVENOUS | Status: AC
  Administered 2019-08-16: 12:00:00 via INTRAVENOUS

## 2019-08-16 NOTE — Patient Instructions (Signed)
[place] at [facility] Discharge Instructions  Follow-up in 3 weeks with labs and treatment.   Thank you for choosing [place] at [facility] to provide your oncology and hematology care.  To afford each patient quality time with our provider, please arrive at least 15 minutes before your scheduled appointment time.   If you have a lab appointment with the [facility] please come in thru the Main Entrance and check in at the main information desk.  You need to re-schedule your appointment should you arrive 10 or more minutes late.  We strive to give you quality time with our providers, and arriving late affects you and other patients whose appointments are after yours.  Also, if you no show three or more times for appointments you may be dismissed from the clinic at the providers discretion.     Again, thank you for choosing [facility].  Our hope is that these requests will decrease the amount of time that you wait before being seen by our physicians.       _____________________________________________________________  Should you have questions after your visit to [facility], please contact our office at [phone] between the hours of 8:00 a.m. and 4:30 p.m.  Voicemails left after 4:00 p.m. will not be returned until the following business day.  For prescription refill requests, have your pharmacy contact our office and allow 72 hours.    Due to Covid, you will need to wear a mask upon entering the hospital. If you do not have a mask, a mask will be given to you at the Main Entrance upon arrival. For doctor visits, patients may have 1 support person with them. For treatment visits, patients can not have anyone with them due to social distancing guidelines and our immunocompromised population.

## 2019-08-16 NOTE — Progress Notes (Signed)
[place], [place] [address]   CLINIC:  Medical Oncology/Hematology  PCP:  Tara Chroman, MD [place] [phone]   REASON FOR VISIT: Follow-up for non-small cell lung cancer.  CURRENT THERAPY: Carboplatin paclitaxel and pembrolizumab  BRIEF ONCOLOGIC HISTORY:  Oncology History  Squamous cell lung cancer, unspecified laterality ([place])  04/05/2019 Initial Diagnosis   Squamous cell lung cancer, unspecified laterality ([place])   04/08/2019 -  Chemotherapy   The patient had palonosetron (ALOXI) injection 0.25 mg, 0.25 mg, Intravenous,  Once, 6 of 7 cycles Administration: 0.25 mg (04/08/2019), 0.25 mg (05/02/2019), 0.25 mg (05/23/2019), 0.25 mg (06/13/2019), 0.25 mg (07/05/2019), 0.25 mg (07/26/2019) pegfilgrastim (NEULASTA) injection 6 mg, 6 mg, Subcutaneous, Once, 2 of 2 cycles Administration: 6 mg (04/11/2019), 6 mg (05/04/2019) pegfilgrastim (NEULASTA ONPRO KIT) injection 6 mg, 6 mg, Subcutaneous, Once, 3 of 3 cycles pegfilgrastim-cbqv (UDENYCA) injection 6 mg, 6 mg, Subcutaneous, Once, 4 of 5 cycles Administration: 6 mg (05/25/2019), 6 mg (06/15/2019), 6 mg (07/07/2019), 6 mg (07/28/2019) CARBOplatin (PARAPLATIN) 900 mg in sodium chloride 0.9 % 500 mL chemo infusion, 900 mg (100 % of original dose 900 mg), Intravenous,  Once, 6 of 7 cycles Dose modification:   (original dose 900 mg, Cycle 1),   (original dose 900 mg, Cycle 2),   (original dose 900 mg, Cycle 3),   (original dose 900 mg, Cycle 4) Administration: 900 mg (04/08/2019), 900 mg (05/02/2019), 900 mg (05/23/2019), 900 mg (06/13/2019), 900 mg (07/05/2019), 900 mg (07/26/2019) PACLitaxel (TAXOL) 492 mg in sodium chloride 0.9 % 500 mL chemo infusion (> 39m/m2), 200 mg/m2 = 492 mg, Intravenous,  Once, 6 of 7 cycles Dose modification: 175 mg/m2 (original dose 200 mg/m2, Cycle 6, Reason: Other (see comments), Comment: neuropathy) Administration: 492 mg (04/08/2019), 492 mg (05/02/2019), 492 mg  (05/23/2019), 492 mg (06/13/2019), 492 mg (07/05/2019), 432 mg (07/26/2019) pembrolizumab (KEYTRUDA) 200 mg in sodium chloride 0.9 % 50 mL chemo infusion, 200 mg, Intravenous, Once, 6 of 7 cycles Administration: 200 mg (04/08/2019), 200 mg (07/05/2019), 200 mg (05/02/2019), 200 mg (05/23/2019), 200 mg (06/13/2019), 200 mg (07/26/2019) fosaprepitant (EMEND) 150 mg, dexamethasone (DECADRON) 12 mg in sodium chloride 0.9 % 145 mL IVPB, , Intravenous,  Once, 6 of 7 cycles Administration:  (04/08/2019),  (05/02/2019),  (05/23/2019),  (06/13/2019),  (07/05/2019),  (07/26/2019)  for chemotherapy treatment.        INTERVAL HISTORY:  [name] returns for routine follow-up for non-small cell lung cancer.  Patient reports she has been doing well since her last treatment.  She does report a couple of days of nausea and vomiting after chemo.  The Compazine helps resolve it.  She does have some numbness and tingling in her hands and feet which is stable at this time and is not constant. Denies any diarrhea. Denies any new pains. Had not noticed any recent bleeding such as epistaxis, hematuria or hematochezia. Denies recent chest pain on exertion, shortness of breath on minimal exertion, pre-syncopal episodes, or palpitations. Denies any numbness or tingling in hands or feet. Denies any recent fevers, infections, or recent hospitalizations. Patient reports appetite at 100% and energy level at 25%.  She is eating well maintain her weight at this time.    REVIEW OF SYSTEMS:  Review of Systems  Constitutional: Positive for fatigue.  Respiratory: Positive for cough and shortness of breath.   Neurological: Positive for numbness.  All other systems reviewed and are negative.  PAST MEDICAL/SURGICAL HISTORY:  Past Medical History:  Diagnosis Date  . Depression   . Glucose intolerance 03/11/2019  . Obesity, Class III, BMI 40-49.9 (morbid obesity) ([place]) 03/11/2019   Past Surgical History:  Procedure Laterality Date   . CESAREAN SECTION    . IRRIGATION AND DEBRIDEMENT ABSCESS  03/15/2019   RETROPHARYNGEAL   . TONSILLECTOMY AND ADENOIDECTOMY N/A 03/15/2019   Procedure: IRRIGATION AND DEBRIDEMENT OF RETROPHARYNGEAL ABSCESS;  Surgeon: Tara Baptist, MD;  Location: [facility];  Service: ENT;  Laterality: N/A;     SOCIAL HISTORY:  Social History   Socioeconomic History  . Marital status: Widowed    Spouse name: Not on file  . Number of children: 1  . Years of education: Not on file  . Highest education level: Not on file  Occupational History  . Not on file  Social Needs  . Financial resource strain: Not very hard  . Food insecurity    Worry: Never true    Inability: Never true  . Transportation needs    Medical: No    Non-medical: No  Tobacco Use  . Smoking status: Current Every Day Smoker    Packs/day: 1.00    Years: 25.00    Pack years: 25.00    Types: Cigarettes  . Smokeless tobacco: Never Used  Substance and Sexual Activity  . Alcohol use: Not Currently    Comment: social   . Drug use: Not Currently  . Sexual activity: Not on file  Lifestyle  . Physical activity    Days per week: 0 days    Minutes per session: 0 min  . Stress: Only a little  Relationships  . Social connections    Talks on phone: More than three times a week    Gets together: Twice a week    Attends religious service: Never    Active member of club or organization: No    Attends meetings of clubs or organizations: Never    Relationship status: Widowed  . Intimate partner violence    Fear of current or ex partner: No    Emotionally abused: No    Physically abused: No    Forced sexual activity: No  Other Topics Concern  . Not on file  Social History Narrative   2019-04-03   Husband passed away four months ago from [facility] [place].   Pt. Has 7 yo son at home.    FAMILY HISTORY:  Family History  Problem Relation Age of Onset  . Lung cancer Mother   . Hypertension Mother   . Leukemia Father   .  Myelodysplastic syndrome Father     CURRENT MEDICATIONS:  Outpatient Encounter Medications as of 08/16/2019  Medication Sig  . apixaban (ELIQUIS) 5 MG TABS tablet Take 1 tablet (5 mg total) by mouth 2 (two) times daily.  [name] CARBOPLATIN IV Inject into the vein every 21 ( twenty-one) days. X 4 cycles  . gabapentin (NEURONTIN) 300 MG capsule Take 1 capsule (300 mg total) by mouth 2 (two) times daily.  [name] oxyCODONE (OXY IR/ROXICODONE) 5 MG immediate release tablet Take 1 tablet (5 mg total) by mouth every 4 (four) hours as needed for severe pain.  [name] PACLitaxel (TAXOL IV) Inject into the vein every 21 ( twenty-one) days. X 4 cycles  . PEMBROLIZUMAB IV Inject into the vein every 21 ( twenty-one) days.  . furosemide (LASIX) 20 MG tablet Take 1 tablet (20 mg total) by mouth once as needed for up to 1 dose (  Take 73m once daily as needed). (Patient not taking: Reported on 07/26/2019)  . nystatin (MYCOSTATIN/NYSTOP) powder Apply topically as needed.   . prochlorperazine (COMPAZINE) 10 MG tablet Take 1 tablet (10 mg total) by mouth every 6 (six) hours as needed (Nausea or vomiting). (Patient not taking: Reported on 07/26/2019)   No facility-administered encounter medications on file as of 08/16/2019.     ALLERGIES:  No Known Allergies   PHYSICAL EXAM:  ECOG Performance status: 1  Vitals:   08/16/19 0902  BP: 93/80  Pulse: (!) 116  Resp: 20  Temp: 97.7 F (36.5 C)  SpO2: 97%   Filed Weights   08/16/19 0902  Weight: 269 lb 3.2 oz (122.1 kg)    Physical Exam Constitutional:      Appearance: She is obese.  Cardiovascular:     Rate and Rhythm: Normal rate and regular rhythm.     Heart sounds: Normal heart sounds.  Pulmonary:     Effort: Pulmonary effort is normal.     Breath sounds: Normal breath sounds.  Abdominal:     General: Bowel sounds are normal.     Palpations: Abdomen is soft.  Musculoskeletal: Normal range of motion.  Skin:    General: Skin is warm.  Neurological:      Mental Status: She is alert and oriented to person, place, and time. Mental status is at baseline.  Psychiatric:        Mood and Affect: Mood normal.        Behavior: Behavior normal.        Thought Content: Thought content normal.        Judgment: Judgment normal.      LABORATORY DATA:  I have reviewed the labs as listed.  CBC    Component Value Date/Time   WBC 4.0 07/26/2019 0847   RBC 4.07 07/26/2019 0847   HGB 12.6 07/26/2019 0847   HCT 39.8 07/26/2019 0847   PLT 113 (L) 07/26/2019 0847   MCV 97.8 07/26/2019 0847   MCH 31.0 07/26/2019 0847   MCHC 31.7 07/26/2019 0847   RDW 16.4 (H) 07/26/2019 0847   LYMPHSABS 0.7 07/26/2019 0847   MONOABS 0.5 07/26/2019 0847   EOSABS 0.1 07/26/2019 0847   BASOSABS 0.0 07/26/2019 0847   CMP Latest Ref Rng & Units 07/26/2019 07/05/2019 06/13/2019  Glucose 70 - 99 mg/dL 115(H) 98 100(H)  BUN 6 - 20 mg/dL _0 Creatinine 0.44 - 1.00 mg/dL 0.88 0.88 0.65  Sodium 135 - 145 mmol/L 138 139 138  Potassium 3.5 - 5.1 mmol/L 3.9 3.6 3.7  Chloride 98 - 111 mmol/L 104 106 106  CO2 22 - 32 mmol/L _1 Calcium 8.9 - 10.3 mg/dL 9.5 9.2 9.3  Total Protein 6.5 - 8.1 g/dL 7.3 7.0 7.0  Total Bilirubin 0.3 - 1.2 mg/dL 0.4 0.4 0.4  Alkaline Phos 38 - 126 U/L 63 59 53  AST 15 - 41 U/L _2 ALT 0 - 44 U/L 34 28 33       DIAGNOSTIC IMAGING:  I have independently reviewed the CT scans and discussed with the patient.    I personally performed a face-to-face visit.  All questions were answered to patient's stated satisfaction. Encouraged patient to call with any new concerns or questions before his next visit to the cancer center and we can certain see him sooner, if needed.     ASSESSMENT & PLAN:   Squamous cell lung cancer, unspecified laterality ([place]  1. 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  She is a current smoker. -Biopsy of the right cervical lymph node  consistent with nonsmall cell cancer, squamous cell. -Palliative XRT to the chest completed on 03/31/2019. -PET scan on 04/04/2019 showed markedly hypermetabolic nodal disease in the right subclavicular region, right thoracic inlet, high right paratracheal station.  Lymphadenopathy in the right neck, both axilla show low-level FDG accumulation.  Markedly hypermetabolic bilateral adrenal nodules. -CT CAP on 06/06/2019 showed decrease in the right subclavicular node measuring 3 x 2.3 cm, compared to 5.3 x 4.6 cm.  Right paratracheal node measured 2.9 x 2.1 cm compared to 6.4 x 4.5 cm.  Resolution of adrenal nodules. -6 cycles of carboplatin, paclitaxel and pembrolizumab from 04/08/2019 through 07/26/2019. -CT CAP on 08/11/2019 showed right subclavicular soft tissue measures 3.1x1.8 previously 3.4 x 2.7 cm. Continued improvement with respect to the right subclavicular mass with grossly stable appearance of the cysts mediastinal soft tissue. -We will continue with her 7th cycle of treatment today.  Labs are stable.  She does report having nausea and vomiting for a couple days after treatment.  Her Compazine helps resolve this. -She has a left PICC line that she reports is uncomfortable and she is wishing to have a Port-A-Cath placed once her thrombus has resolved. -She will follow-up in 3 weeks with office visit labs and treatment.  2.  Left basilic vein thrombus: -Ultrasound on 05/03/2019 was positive for a nonocclusive thrombus around the PICC line in the left basal leg vein, considered superficial thrombus.  No evidence of DVT. -Because of her active malignancy, full dose of anticoagulation was recommended. -She is currently on Eliquis and is tolerating it very well.  3.  Right shoulder pain: -She reports this is feeling better.  She is taking oxycodone 5 mg as needed which helps with her pain. -She is also doing physical therapy which is helping her lymphedema in her right upper extremity.      Orders  placed this encounter:  Orders Placed This Encounter  Procedures  . Magnesium  . CBC with Differential/Platelet  . Comprehensive metabolic panel      Tara Finders, FNP-C [place] [phone]

## 2019-08-16 NOTE — Patient Instructions (Signed)
[facility] Discharge Instructions for Patients Receiving Chemotherapy   Beginning January 23rd 2017 lab work for the [facility] will be done in the  Main lab at [facility] on 1st floor. If you have a lab appointment with the [facility] please come in thru the  Main Entrance and check in at the main information desk   Today you received the following chemotherapy agents Taxol,Carboplatin and Keytruda. Follow-up as scheduled. Call clinic for any questions or concerns  To help prevent nausea and vomiting after your treatment, we encourage you to take your nausea medication   If you develop nausea and vomiting, or diarrhea that is not controlled by your medication, call the clinic.  The clinic phone number is [phone]. Office hours are Monday-Friday 8:30am-5:00pm.  BELOW ARE SYMPTOMS THAT SHOULD BE REPORTED IMMEDIATELY:  *FEVER GREATER THAN 101.0 F  *CHILLS WITH OR WITHOUT FEVER  NAUSEA AND VOMITING THAT IS NOT CONTROLLED WITH YOUR NAUSEA MEDICATION  *UNUSUAL SHORTNESS OF BREATH  *UNUSUAL BRUISING OR BLEEDING  TENDERNESS IN MOUTH AND THROAT WITH OR WITHOUT PRESENCE OF ULCERS  *URINARY PROBLEMS  *BOWEL PROBLEMS  UNUSUAL RASH Items with * indicate a potential emergency and should be followed up as soon as possible. If you have an emergency after office hours please contact your primary care physician or go to the nearest emergency department.  Please call the clinic during office hours if you have any questions or concerns.   You may also contact the Patient Navigator at [phone] should you have any questions or need assistance in obtaining follow up care.      Resources For Cancer Patients and their Caregivers ? American Cancer Society: Can assist with transportation, wigs, general needs, runs Look Good Feel Better.        825-756-9071 ? Cancer Care: Provides financial assistance, online support groups, medication/co-pay assistance.   1-800-813-HOPE 210-730-2573) ? [facility] Assists [place] cancer patients and their families through emotional , educational and financial support.  (516)659-9461 ? [place] DSS Where to apply for food stamps, Medicaid and utility assistance. (478) 228-5409 ? [facility]: Transportation to medical appointments. (303) 888-8622 ? Social Security Administration: May apply for disability if have a Stage IV cancer. (681)156-1067 331 611 6576 ? [employer], Disability and Transit Services: Assists with nutrition, care and transit needs. 573-630-4785

## 2019-08-16 NOTE — Progress Notes (Signed)
08/16/19  OK to treat with HR 116 and will get Taxo/Carbo/Keytruda today per Reynolds Bowl NP.  T.O. Reynolds Bowl NP/Abdelrahman Nair Ronnald Ramp, PharmD

## 2019-08-16 NOTE — Assessment & Plan Note (Signed)
1. 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  Tara Aguilar is a current smoker. -Biopsy of the right cervical lymph node consistent with nonsmall cell cancer, squamous cell. -Palliative XRT to the chest completed on 03/31/2019. -PET scan on 04/04/2019 showed markedly hypermetabolic nodal disease in the right subclavicular region, right thoracic inlet, high right paratracheal station.  Lymphadenopathy in the right neck, both axilla show low-level FDG accumulation.  Markedly hypermetabolic bilateral adrenal nodules. -CT CAP on 06/06/2019 showed decrease in the right subclavicular node measuring 3 x 2.3 cm, compared to 5.3 x 4.6 cm.  Right paratracheal node measured 2.9 x 2.1 cm compared to 6.4 x 4.5 cm.  Resolution of adrenal nodules. -6 cycles of carboplatin, paclitaxel and pembrolizumab from 04/08/2019 through 07/26/2019. -CT CAP on 08/11/2019 showed right subclavicular soft tissue measures 3.1x1.8 previously 3.4 x 2.7 cm. Continued improvement with respect to the right subclavicular mass with grossly stable appearance of the cysts mediastinal soft tissue. -We will continue with her 7th cycle of treatment today.  Labs are stable.  Tara Aguilar does report having nausea and vomiting for a couple days after treatment.  Her Compazine helps resolve this. -Tara Aguilar has a left PICC line that Tara Aguilar reports is uncomfortable and Tara Aguilar is wishing to have a Port-A-Cath placed once her thrombus has resolved. -Tara Aguilar will follow-up in 3 weeks with office visit labs and treatment.  2.  Left basilic vein thrombus: -Ultrasound on 05/03/2019 was positive for a nonocclusive thrombus around the PICC line in the left basal leg vein, considered superficial thrombus.  No evidence of DVT. -Because of her active malignancy, full dose of anticoagulation was recommended. -Tara Aguilar is currently on Eliquis and is tolerating it very well.  3.  Right shoulder pain: -Tara Aguilar reports this is feeling better.   Tara Aguilar is taking oxycodone 5 mg as needed which helps with her pain. -Tara Aguilar is also doing physical therapy which is helping her lymphedema in her right upper extremity.

## 2019-08-16 NOTE — Progress Notes (Signed)
1025 Labs and vital signs reviewed with and pt seen by RLockamy NP and pt approved for chemo tx today per NP    Tara Aguilar tolerated chemo tx well without complaints or incident. VSS upon discharge. Pt discharged via wheelchair in satisfactory condition

## 2019-08-17 ENCOUNTER — Other Ambulatory Visit: Payer: Self-pay

## 2019-08-17 ENCOUNTER — Encounter (HOSPITAL_COMMUNITY): Payer: Self-pay | Admitting: Physical Therapy

## 2019-08-17 ENCOUNTER — Ambulatory Visit (HOSPITAL_COMMUNITY): Payer: BC Managed Care – PPO | Admitting: Physical Therapy

## 2019-08-17 DIAGNOSIS — R293 Abnormal posture: Secondary | ICD-10-CM

## 2019-08-17 DIAGNOSIS — I890 Lymphedema, not elsewhere classified: Secondary | ICD-10-CM | POA: Diagnosis not present

## 2019-08-17 NOTE — Therapy (Signed)
[place], [place], [address] Phone: [phone]   Fax:  [phone]  Physical Therapy Treatment  Patient Details  Name: Tara Aguilar MRN: [id number] Date of Birth: 20-Mar-1981 Referring Provider (PT): Shona Simpson    Encounter Date: 08/17/2019  PT End of Session - 08/17/19 1624    Visit Number  15    Number of Visits  24    Date for PT Re-Evaluation  07/28/19    Authorization Type  PT cert ends on 61/95KDTO note visit 75    Authorization - Visit Number  14    Authorization - Number of Visits  24    PT Start Time  6712    PT Stop Time  1544    PT Time Calculation (min)  46 min    Activity Tolerance  Patient tolerated treatment well    Behavior During Therapy  [facility] for tasks assessed/performed       Past Medical History:  Diagnosis Date  . Depression   . Glucose intolerance 03/11/2019  . Obesity, Class III, BMI 40-49.9 (morbid obesity) ([place]) 03/11/2019    Past Surgical History:  Procedure Laterality Date  . CESAREAN SECTION    . IRRIGATION AND DEBRIDEMENT ABSCESS  03/15/2019   RETROPHARYNGEAL   . TONSILLECTOMY AND ADENOIDECTOMY N/A 03/15/2019   Procedure: IRRIGATION AND DEBRIDEMENT OF RETROPHARYNGEAL ABSCESS;  Surgeon: Leta Baptist, MD;  Location: [facility];  Service: ENT;  Laterality: N/A;    There were no vitals filed for this visit.  Subjective Assessment - 08/17/19 1622    Subjective  PT states that she was not expecting to have to have a large chemo treatment when she saw the MD the other day, states that they had said that they were over but the MD wanted one more.    Pertinent History  cancer    Patient Stated Goals  less swelling better abilty to move    Currently in Pain?  No/denies                       [facility] Adult PT Treatment/Exercise - 08/17/19 0001      Posture/Postural Control   Posture/Postural Control  Postural limitations    Postural Limitations  Rounded Shoulders;Forward  head    Posture Comments  pt obese with overhanging anterior abdomen and diffuse swelling in upper body       Exercises   Exercises  --      Manual Therapy   Manual Therapy  Manual Lymphatic Drainage (MLD)    Manual therapy comments  completed seperately from all other skilled interventions    Manual Lymphatic Drainage (MLD)  To include chest and neck, and facial. supraclavicular, deep and superfical abdominal, and B UE both anterior and posteriorly routing fluid using axillary-inguinal anastomosis.      Compression Bandaging  unable to bandage UE as pt forgot the short stretch bandages                   PT Long Term Goals - 06/14/19 1132      PT LONG TERM GOAL #1   Title  Pt will have decrease of circumference of neck at 8 cm proximal to sternal notch by 2 cm    Baseline  51 cm on 05/18/2019    Time  4    Period  Weeks    Status  On-going      PT LONG TERM GOAL #2  Title  Pt will be independent in diaphragmatic breathing and self MLD to help control lymphedema at home.    Time  4    Period  Weeks    Status  Achieved      PT LONG TERM GOAL #3   Title  Pt will report the discomfort she feels from swelling had decreased by 25%    Status  Achieved      PT LONG TERM GOAL #4   Title  Pt will be indepenedent in a HEP for remedial exercise of right shoulder and neck AROM    Time  4    Period  Weeks    Status  Achieved            Plan - 08/17/19 1626    Clinical Impression Statement  Pt fortgot her compression foam while she went to [place], states the long car ride she slept on one side and woke up to her arm being more edematous.  PT did state that she tried to complete some self manual especially in her breast area.   PT will continue to benefit from skilled therapy for total decongestive techniques of chest, neck and UE B.    Personal Factors and Comorbidities  Comorbidity 3+    Comorbidities  recent radiation, recent dental absess, recent radiation to chest,  ongoing chemo    Examination-Activity Limitations  Dressing;Bathing    Stability/Clinical Decision Making  Evolving/Moderate complexity    Rehab Potential  Good    PT Frequency  3x / week    PT Duration  4 weeks    PT Treatment/Interventions  ADLs/Self Care Home Management;DME Instruction;Therapeutic activities;Therapeutic exercise;Patient/family education;Orthotic Fit/Training;Manual techniques;Manual lymph drainage;Passive range of motion;Taping    PT Next Visit Plan  measure next visit.    PT Home Exercise Plan  7/29:  AROM all motions cervical in seated, bil UE flexion/abduction AROM    Consulted and Agree with Plan of Care  Patient       Patient will benefit from skilled therapeutic intervention in order to improve the following deficits and impairments:  Decreased skin integrity, Impaired perceived functional ability, Increased edema, Decreased range of motion, Decreased knowledge of precautions, Decreased activity tolerance, Decreased knowledge of use of DME, Decreased strength, Increased fascial restricitons, Impaired flexibility, Impaired UE functional use  Visit Diagnosis: Lymphedema, not elsewhere classified  Abnormal posture     Problem List Patient Active Problem List   Diagnosis Date Noted  . 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 Squamous cell lung cancer, unspecified laterality ([place]) 04/05/2019  . Goals of care, counseling/discussion 04/05/2019  . 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 Glucose intolerance 03/11/2019  . Obesity, Class III, BMI 40-49.9 (morbid obesity) ([place]) 03/11/2019  . Tobacco use 03/11/2019  . Dental abscess 03/07/2019  . 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 Depression     Rayetta Humphrey, [place] CLT [phone] 08/17/2019, 4:29  PM  [facility] [address] [place], [place], [address] Phone: [phone]   Fax:  [phone]  Name: Tara Aguilar MRN: [id number] Date of Birth: 1980/10/28

## 2019-08-18 ENCOUNTER — Other Ambulatory Visit: Payer: Self-pay

## 2019-08-18 ENCOUNTER — Inpatient Hospital Stay (HOSPITAL_COMMUNITY): Payer: BC Managed Care – PPO

## 2019-08-18 ENCOUNTER — Encounter (HOSPITAL_COMMUNITY): Payer: Self-pay

## 2019-08-18 VITALS — BP 135/88 | HR 105 | Temp 97.8°F | Resp 18

## 2019-08-18 DIAGNOSIS — Z452 Encounter for adjustment and management of vascular access device: Secondary | ICD-10-CM | POA: Diagnosis not present

## 2019-08-18 DIAGNOSIS — C3490 Malignant neoplasm of unspecified part of unspecified bronchus or lung: Secondary | ICD-10-CM

## 2019-08-18 MED ORDER — PEGFILGRASTIM-CBQV 6 MG/0.6ML ~~LOC~~ SOSY
6.0000 mg | PREFILLED_SYRINGE | Freq: Once | SUBCUTANEOUS | Status: AC
  Administered 2019-08-18: 6 mg via SUBCUTANEOUS

## 2019-08-18 MED ORDER — PEGFILGRASTIM-CBQV 6 MG/0.6ML ~~LOC~~ SOSY
PREFILLED_SYRINGE | SUBCUTANEOUS | Status: AC
  Filled 2019-08-18: qty 0.6

## 2019-08-18 NOTE — Patient Instructions (Signed)
[place] at [facility] Discharge Instructions  Received Udenyca injection today. Follow-up as scheduled. Call clinic for any questions or concerns   Thank you for choosing [place] at [facility] to provide your oncology and hematology care.  To afford each patient quality time with our provider, please arrive at least 15 minutes before your scheduled appointment time.   If you have a lab appointment with the [place] please come in thru the Main Entrance and check in at the main information desk.  You need to re-schedule your appointment should you arrive 10 or more minutes late.  We strive to give you quality time with our providers, and arriving late affects you and other patients whose appointments are after yours.  Also, if you no show three or more times for appointments you may be dismissed from the clinic at the providers discretion.     Again, thank you for choosing [facility].  Our hope is that these requests will decrease the amount of time that you wait before being seen by our physicians.       _____________________________________________________________  Should you have questions after your visit to [facility], please contact our office at [phone] between the hours of 8:00 a.m. and 4:30 p.m.  Voicemails left after 4:00 p.m. will not be returned until the following business day.  For prescription refill requests, have your pharmacy contact our office and allow 72 hours.    Due to Covid, you will need to wear a mask upon entering the hospital. If you do not have a mask, a mask will be given to you at the Main Entrance upon arrival. For doctor visits, patients may have 1 support person with them. For treatment visits, patients can not have anyone with them due to social distancing guidelines and our immunocompromised population.

## 2019-08-18 NOTE — Progress Notes (Signed)
Tara Aguilar tolerated Udenyca injection well without complaints or incident. VSS Pt discharged via wheelchair in satisfactory condition

## 2019-08-19 ENCOUNTER — Ambulatory Visit (HOSPITAL_COMMUNITY): Payer: BC Managed Care – PPO | Admitting: Physical Therapy

## 2019-08-22 ENCOUNTER — Ambulatory Visit (HOSPITAL_COMMUNITY): Payer: BC Managed Care – PPO | Admitting: Physical Therapy

## 2019-08-22 ENCOUNTER — Telehealth (HOSPITAL_COMMUNITY): Payer: Self-pay | Admitting: Physical Therapy

## 2019-08-22 NOTE — Telephone Encounter (Signed)
Called pt re 2nd no show; mailbox is full and pt can not accept messages. Rayetta Humphrey, [place] CLT [phone]

## 2019-08-23 ENCOUNTER — Other Ambulatory Visit: Payer: Self-pay

## 2019-08-23 ENCOUNTER — Inpatient Hospital Stay (HOSPITAL_COMMUNITY): Payer: BC Managed Care – PPO

## 2019-08-23 ENCOUNTER — Encounter (HOSPITAL_COMMUNITY): Payer: Self-pay

## 2019-08-23 DIAGNOSIS — Z452 Encounter for adjustment and management of vascular access device: Secondary | ICD-10-CM | POA: Diagnosis not present

## 2019-08-23 MED ORDER — HEPARIN SOD (PORK) LOCK FLUSH 100 UNIT/ML IV SOLN
300.0000 [IU] | Freq: Once | INTRAVENOUS | Status: AC
  Administered 2019-08-23: 250 [IU] via INTRAVENOUS

## 2019-08-23 MED ORDER — SODIUM CHLORIDE 0.9% FLUSH
10.0000 mL | Freq: Once | INTRAVENOUS | Status: AC
  Administered 2019-08-23: 10 mL via INTRAVENOUS

## 2019-08-23 NOTE — Progress Notes (Signed)
PICC line dressing change done and assisted by another RN.   Tara Aguilar presented for PICC line flush. PICC line located left upper arm. Good blood return present in both lumens PICC line flushed with 80ml NS and 300U/32ml Heparin in each lumen. Procedure without incident. Patient tolerated procedure well.  Vitals stable and discharged home from clinic via wheelchair.. Follow up as scheduled.

## 2019-08-23 NOTE — Patient Instructions (Signed)
[facility] at [facility]  Discharge Instructions:   _______________________________________________________________  Thank you for choosing [facility] at [facility] to provide your oncology and hematology care.  To afford each patient quality time with our providers, please arrive at least 15 minutes before your scheduled appointment.  You need to re-schedule your appointment if you arrive 10 or more minutes late.  We strive to give you quality time with our providers, and arriving late affects you and other patients whose appointments are after yours.  Also, if you no show three or more times for appointments you may be dismissed from the clinic.  Again, thank you for choosing [facility] at [facility]. Our hope is that these requests will allow you access to exceptional care and in a timely manner. _______________________________________________________________  If you have questions after your visit, please contact our office at [phone] between the hours of 8:30 a.m. and 5:00 p.m. Voicemails left after 4:30 p.m. will not be returned until the following business day. _______________________________________________________________  For prescription refill requests, have your pharmacy contact our office. _______________________________________________________________  Recommendations made by the consultant and any test results will be sent to your referring physician. _______________________________________________________________ 

## 2019-08-24 ENCOUNTER — Encounter (HOSPITAL_COMMUNITY): Payer: BC Managed Care – PPO | Admitting: Physical Therapy

## 2019-08-26 ENCOUNTER — Ambulatory Visit (HOSPITAL_COMMUNITY): Payer: BC Managed Care – PPO | Admitting: Physical Therapy

## 2019-08-27 ENCOUNTER — Encounter (HOSPITAL_COMMUNITY): Payer: Self-pay | Admitting: Hematology

## 2019-08-27 NOTE — Progress Notes (Signed)
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  CURRENT THERAPY: carboplatin, Taxol and pembrolizumab   BRIEF ONCOLOGIC HISTORY:  Oncology History  Squamous cell lung cancer, unspecified laterality ([place])  04/05/2019 Initial Diagnosis   Squamous cell lung cancer, unspecified laterality ([place])   04/08/2019 -  Chemotherapy   The patient had palonosetron (ALOXI) injection 0.25 mg, 0.25 mg, Intravenous,  Once, 7 of 8 cycles Administration: 0.25 mg (04/08/2019), 0.25 mg (05/02/2019), 0.25 mg (05/23/2019), 0.25 mg (06/13/2019), 0.25 mg (07/05/2019), 0.25 mg (07/26/2019), 0.25 mg (08/16/2019) pegfilgrastim (NEULASTA) injection 6 mg, 6 mg, Subcutaneous, Once, 2 of 2 cycles Administration: 6 mg (04/11/2019), 6 mg (05/04/2019) pegfilgrastim (NEULASTA ONPRO KIT) injection 6 mg, 6 mg, Subcutaneous, Once, 3 of 3 cycles pegfilgrastim-cbqv (UDENYCA) injection 6 mg, 6 mg, Subcutaneous, Once, 5 of 6 cycles Administration: 6 mg (05/25/2019), 6 mg (06/15/2019), 6 mg (07/07/2019), 6 mg (07/28/2019), 6 mg (08/18/2019) CARBOplatin (PARAPLATIN) 900 mg in sodium chloride 0.9 % 500 mL chemo infusion, 900 mg (100 % of original dose 900 mg), Intravenous,  Once, 7 of 8 cycles Dose modification:   (original dose 900 mg, Cycle 1),   (original dose 900 mg, Cycle 2),   (original dose 900 mg, Cycle 3),   (original dose 900 mg, Cycle 4) Administration: 900 mg (04/08/2019), 900 mg (05/02/2019), 900 mg (05/23/2019), 900 mg (06/13/2019), 900 mg (07/05/2019), 900 mg (07/26/2019), 900 mg (08/16/2019) PACLitaxel (TAXOL) 492 mg in sodium chloride 0.9 % 500 mL chemo infusion (> 62m/m2), 200 mg/m2 = 492 mg, Intravenous,  Once, 7 of 8 cycles Dose modification: 175 mg/m2 (original dose 200 mg/m2, Cycle 6, Reason: Other (see comments), Comment:  neuropathy) Administration: 492 mg (04/08/2019), 492 mg (05/02/2019), 492 mg (05/23/2019), 492 mg (06/13/2019), 492 mg (07/05/2019), 432 mg (07/26/2019), 432 mg (08/16/2019) pembrolizumab (KEYTRUDA) 200 mg in sodium chloride 0.9 % 50 mL chemo infusion, 200 mg, Intravenous, Once, 7 of 8 cycles Administration: 200 mg (04/08/2019), 200 mg (07/05/2019), 200 mg (05/02/2019), 200 mg (05/23/2019), 200 mg (06/13/2019), 200 mg (07/26/2019), 200 mg (08/16/2019) fosaprepitant (EMEND) 150 mg, dexamethasone (DECADRON) 12 mg in sodium chloride 0.9 % 145 mL IVPB, , Intravenous,  Once, 7 of 8 cycles Administration:  (04/08/2019),  (05/02/2019),  (05/23/2019),  (06/13/2019),  (07/05/2019),  (07/26/2019),  (08/16/2019)  for chemotherapy treatment.        INTERVAL HISTORY:  Ms. [id number]. female seen for follow-up and cycle 6 of chemotherapy and toxicity evaluation.  Cycle 5 was on 07/05/2019.  She had experienced some nausea but denied any vomiting.  No constipation or diarrhea was reported.  Cough and shortness of breath on exertion is stable.  Appetite and energy levels are rated at 75%.  Some soreness in the neck region and shoulders present.  Numbness in the fingers and toes is stable.   REVIEW OF SYSTEMS:  Review of Systems  Respiratory: Positive for cough and shortness of breath.   Gastrointestinal: Positive for nausea.  Neurological: Positive for numbness.  All other systems reviewed and are negative.    PAST MEDICAL/SURGICAL HISTORY:  Past Medical History:  Diagnosis Date  . Depression   . Glucose intolerance 03/11/2019  . Obesity, Class III, BMI 40-49.9 (morbid obesity) ([place] 03/11/2019   Past Surgical History:  Procedure Laterality Date  .  CESAREAN SECTION    . IRRIGATION AND DEBRIDEMENT ABSCESS  03/15/2019   RETROPHARYNGEAL   . TONSILLECTOMY AND ADENOIDECTOMY N/A 03/15/2019   Procedure: IRRIGATION AND DEBRIDEMENT OF RETROPHARYNGEAL ABSCESS;  Surgeon: Leta Baptist, MD;  Location: [facility];  Service: ENT;  Laterality:  N/A;     SOCIAL HISTORY:  Social History   Socioeconomic History  . Marital status: Widowed    Spouse name: Not on file  . Number of children: 1  . Years of education: Not on file  . Highest education level: Not on file  Occupational History  . Not on file  Social Needs  . Financial resource strain: Not very hard  . Food insecurity    Worry: Never true    Inability: Never true  . Transportation needs    Medical: No    Non-medical: No  Tobacco Use  . Smoking status: Current Every Day Smoker    Packs/day: 1.00    Years: 25.00    Pack years: 25.00    Types: Cigarettes  . Smokeless tobacco: Never Used  Substance and Sexual Activity  . Alcohol use: Not Currently    Comment: social   . Drug use: Not Currently  . Sexual activity: Not on file  Lifestyle  . Physical activity    Days per week: 0 days    Minutes per session: 0 min  . Stress: Only a little  Relationships  . Social connections    Talks on phone: More than three times a week    Gets together: Twice a week    Attends religious service: Never    Active member of club or organization: No    Attends meetings of clubs or organizations: Never    Relationship status: Widowed  . Intimate partner violence    Fear of current or ex partner: No    Emotionally abused: No    Physically abused: No    Forced sexual activity: No  Other Topics Concern  . Not on file  Social History Narrative   Mar 09, 2019   Husband passed away four months ago from [facility] [place].   Pt. Has 11 yo son at home.    FAMILY HISTORY:  Family History  Problem Relation Age of Onset  . Lung cancer Mother   . Hypertension Mother   . Leukemia Father   . Myelodysplastic syndrome Father     CURRENT MEDICATIONS:  Outpatient Encounter Medications as of 07/26/2019  Medication Sig  . CARBOPLATIN IV Inject into the vein every 21 ( twenty-one) days. X 4 cycles  . PACLitaxel (TAXOL IV) Inject into the vein every 21 ( twenty-one) days. X 4  cycles  . PEMBROLIZUMAB IV Inject into the vein every 21 ( twenty-one) days.  . [DISCONTINUED] Eliquis DVT/PE Starter Pack (ELIQUIS STARTER PACK) 5 MG TABS Take as directed on package: start with two-73m tablets twice daily for 7 days. On day 8, switch to one-516mtablet twice daily.  . furosemide (LASIX) 20 MG tablet Take 1 tablet (20 mg total) by mouth once as needed for up to 1 dose (Take 2062mnce daily as needed). (Patient not taking: Reported on 07/26/2019)  . nystatin (MYCOSTATIN/NYSTOP) powder Apply topically as needed.   . prochlorperazine (COMPAZINE) 10 MG tablet Take 1 tablet (10 mg total) by mouth every 6 (six) hours as needed (Nausea or vomiting). (Patient not taking: Reported on 07/26/2019)  . [DISCONTINUED] buPROPion (WELLBUTRIN XL) 150 MG 24 hr tablet Take 150 mg by mouth daily.  . [  DISCONTINUED] ibuprofen (ADVIL) 400 MG tablet Take 800 mg by mouth every 6 (six) hours as needed (pain).   . [DISCONTINUED] oxyCODONE (OXY IR/ROXICODONE) 5 MG immediate release tablet Take 1 tablet (5 mg total) by mouth every 4 (four) hours as needed for severe pain. (Patient not taking: Reported on 07/26/2019)  . [EXPIRED] influenza vac split quadrivalent PF (FLUARIX) injection 0.5 mL    No facility-administered encounter medications on file as of 07/26/2019.     ALLERGIES:  No Known Allergies   PHYSICAL EXAM:  ECOG Performance status: 1  Vitals:   07/26/19 0855  BP: 132/86  Pulse: 100  Resp: 20  Temp: (!) 97 F (36.1 C)  SpO2: 98%   Filed Weights   07/26/19 0855  Weight: 259 lb 6.4 oz (117.7 kg)    Physical Exam Constitutional:      Appearance: She is obese.     Comments: Facial edema   HENT:     Mouth/Throat:     Mouth: Mucous membranes are moist.     Pharynx: Oropharynx is clear.  Eyes:     Extraocular Movements: Extraocular movements intact.     Conjunctiva/sclera: Conjunctivae normal.  Neck:     Musculoskeletal: Normal range of motion.  Cardiovascular:     Rate and  Rhythm: Normal rate and regular rhythm.     Heart sounds: Normal heart sounds.  Pulmonary:     Effort: Pulmonary effort is normal.     Breath sounds: Normal breath sounds.  Abdominal:     General: Bowel sounds are normal. There is no distension.     Palpations: There is no mass.  Musculoskeletal: Normal range of motion.  Skin:    General: Skin is warm.  Neurological:     General: No focal deficit present.     Mental Status: She is alert and oriented to person, place, and time.  Psychiatric:        Mood and Affect: Mood normal.        Behavior: Behavior normal.        Thought Content: Thought content normal.        Judgment: Judgment normal.      LABORATORY DATA:  I have reviewed the labs as listed.  CBC    Component Value Date/Time   WBC 4.1 08/16/2019 0930   RBC 3.60 (L) 08/16/2019 0930   HGB 11.5 (L) 08/16/2019 0930   HCT 37.3 08/16/2019 0930   PLT 119 (L) 08/16/2019 0930   MCV 103.6 (H) 08/16/2019 0930   MCH 31.9 08/16/2019 0930   MCHC 30.8 08/16/2019 0930   RDW 17.2 (H) 08/16/2019 0930   LYMPHSABS 0.6 (L) 08/16/2019 0930   MONOABS 0.4 08/16/2019 0930   EOSABS 0.0 08/16/2019 0930   BASOSABS 0.0 08/16/2019 0930   CMP Latest Ref Rng & Units 08/16/2019 07/26/2019 07/05/2019  Glucose 70 - 99 mg/dL 157(H) 115(H) 98  BUN 6 - 20 mg/dL _0 Creatinine 0.44 - 1.00 mg/dL 1.02(H) 0.88 0.88  Sodium 135 - 145 mmol/L 141 138 139  Potassium 3.5 - 5.1 mmol/L 3.9 3.9 3.6  Chloride 98 - 111 mmol/L 108 104 106  CO2 22 - 32 mmol/L _1 Calcium 8.9 - 10.3 mg/dL 8.8(L) 9.5 9.2  Total Protein 6.5 - 8.1 g/dL 6.8 7.3 7.0  Total Bilirubin 0.3 - 1.2 mg/dL 0.4 0.4 0.4  Alkaline Phos 38 - 126 U/L 70 63 59  AST 15 - 41 U/L _2 ALT 0 -  44 U/L 25 34 28      ASSESSMENT & PLAN:   Squamous cell lung cancer, unspecified laterality ([place])  1.  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 - Palliative XRT to the chest completed on 03/31/2019. - PET scan on 04/04/2019 showed markedly hypermetabolic nodal disease in the right supraclavicular region, right thoracic inlet, high right paratracheal station.  Lymphadenopathy in the right neck, both axilla show low-level FDG accumulation.  Markedly hypermetabolic bilateral adrenal nodules. -5 cycles of carboplatin, paclitaxel and pembrolizumab from 04/08/2019 through 07/05/2019. - CT CAP on 06/06/2019 shows decrease in the right supraclavicular node measuring 3 x 2.3 cm, compared to 5.3 x 4.6 cm.  Right paratracheal node measures 2.9 x 2.1 cm compared to 6.4 x 4.5 cm.  Resolution of adrenal nodules. -I have reviewed her labs.  She will proceed with her cycle 6 today without any dose modifications.  We will plan to introduce maintenance pembrolizumab after today's cycle. -She will have scans done in 3 weeks.  2.  Left basilic vein thrombus: -Ultrasound on 05/03/2019+ for nonocclusive thrombus around the PICC line in the left basilic vein, considered superficial thrombus.  No evidence of DVT. - Because of her active malignancy, full dose anticoagulation was recommended.  She is currently on Eliquis and is tolerating very well.  3.  Right shoulder pain: -She is taking oxycodone 5 mg as needed, mostly at bedtime. -She is also doing physical therapy which is helping her lymphedema of the right upper extremity.     Total time spent is 25 minutes with more than 50% of the time spent face-to-face discussing treatment plan, counseling and coordination of care.    Derek Jack, MD [place] [phone]

## 2019-08-27 NOTE — Assessment & Plan Note (Signed)
  1.  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 - Palliative XRT to the chest completed on 03/31/2019. - PET scan on 04/04/2019 showed markedly hypermetabolic nodal disease in the right supraclavicular region, right thoracic inlet, high right paratracheal station.  Lymphadenopathy in the right neck, both axilla show low-level FDG accumulation.  Markedly hypermetabolic bilateral adrenal nodules. -5 cycles of carboplatin, paclitaxel and pembrolizumab from 04/08/2019 through 07/05/2019. - CT CAP on 06/06/2019 shows decrease in the right supraclavicular node measuring 3 x 2.3 cm, compared to 5.3 x 4.6 cm.  Right paratracheal node measures 2.9 x 2.1 cm compared to 6.4 x 4.5 cm.  Resolution of adrenal nodules. -I have reviewed her labs.  She will proceed with her cycle 6 today without any dose modifications.  We will plan to introduce maintenance pembrolizumab after today's cycle. -She will have scans done in 3 weeks.  2.  Left basilic vein thrombus: -Ultrasound on 05/03/2019+ for nonocclusive thrombus around the PICC line in the left basilic vein, considered superficial thrombus.  No evidence of DVT. - Because of her active malignancy, full dose anticoagulation was recommended.  She is currently on Eliquis and is tolerating very well.  3.  Right shoulder pain: -She is taking oxycodone 5 mg as needed, mostly at bedtime. -She is also doing physical therapy which is helping her lymphedema of the right upper extremity.

## 2019-08-29 ENCOUNTER — Encounter (HOSPITAL_COMMUNITY): Payer: Self-pay | Admitting: Physical Therapy

## 2019-08-29 NOTE — Therapy (Unsigned)
[place], [place], [address] Phone: [phone]   Fax:  [phone]  Patient Details  Name: Tara Aguilar MRN: [id number] Date of Birth: [birth year] Referring Provider:  No ref. provider found  Encounter Date: 08/29/2019  PT 3rd no show.  Therapist is unable to contact pt due to mailbox being full.   A letter was sent to pt house explaining that due to departmental policy she will be discharged at this time.  Rayetta Humphrey, [place] CLT [phone] 08/29/2019, 12:23 PM   [place] [address] [place], [place], [address] Phone: [phone]   Fax:  [phone]

## 2019-08-29 NOTE — Therapy (Deleted)
[place], [place], [address] Phone: [phone]   Fax:  [phone]  Patient Details  Name: Tara Aguilar MRN: [id number] Date of Birth: September 12, 1981 Referring Provider:  Glenda Chroman, MD  Encounter Date: 08/26/2019    PT 3rd no show.  Therapist is unable to contact pt due to mailbox being full.   A letter was sent to pt house explaining that due to departmental policy she will be discharged at this time.  Rayetta Humphrey, [place] CLT [phone] 08/29/2019, 12:23 PM  [place] [address] [place], [place], [address] Phone: [phone]   Fax:  [phone]

## 2019-08-30 ENCOUNTER — Encounter (HOSPITAL_COMMUNITY): Payer: BC Managed Care – PPO

## 2019-08-31 ENCOUNTER — Encounter (HOSPITAL_COMMUNITY): Payer: Self-pay

## 2019-08-31 ENCOUNTER — Inpatient Hospital Stay (HOSPITAL_COMMUNITY): Payer: BC Managed Care – PPO | Attending: Hematology

## 2019-08-31 ENCOUNTER — Other Ambulatory Visit: Payer: Self-pay

## 2019-08-31 VITALS — BP 134/76 | HR 108 | Temp 97.9°F | Resp 18

## 2019-08-31 DIAGNOSIS — Z7901 Long term (current) use of anticoagulants: Secondary | ICD-10-CM | POA: Diagnosis not present

## 2019-08-31 DIAGNOSIS — F17210 Nicotine dependence, cigarettes, uncomplicated: Secondary | ICD-10-CM | POA: Diagnosis not present

## 2019-08-31 DIAGNOSIS — C3490 Malignant neoplasm of unspecified part of unspecified bronchus or lung: Secondary | ICD-10-CM | POA: Insufficient documentation

## 2019-08-31 DIAGNOSIS — G620 Drug-induced polyneuropathy: Secondary | ICD-10-CM | POA: Insufficient documentation

## 2019-08-31 DIAGNOSIS — Z5112 Encounter for antineoplastic immunotherapy: Secondary | ICD-10-CM | POA: Insufficient documentation

## 2019-08-31 DIAGNOSIS — M25511 Pain in right shoulder: Secondary | ICD-10-CM | POA: Diagnosis not present

## 2019-08-31 DIAGNOSIS — Z452 Encounter for adjustment and management of vascular access device: Secondary | ICD-10-CM | POA: Diagnosis not present

## 2019-08-31 DIAGNOSIS — Z79899 Other long term (current) drug therapy: Secondary | ICD-10-CM | POA: Insufficient documentation

## 2019-08-31 MED ORDER — HEPARIN SOD (PORK) LOCK FLUSH 100 UNIT/ML IV SOLN
500.0000 [IU] | Freq: Once | INTRAVENOUS | Status: AC
  Administered 2019-08-31: 500 [IU] via INTRAVENOUS

## 2019-08-31 MED ORDER — SODIUM CHLORIDE 0.9% FLUSH
10.0000 mL | Freq: Once | INTRAVENOUS | Status: AC
  Administered 2019-08-31: 12:00:00 10 mL via INTRAVENOUS

## 2019-08-31 NOTE — Progress Notes (Signed)
Left upper arm PICC line flushed with dressing change per policy.  Second nurse assisted.   Good blood return noted with both lumens.   Site clean and dry with no complaints of pain with flush or insertion site.  VSs with discharge and left by wheelchair with no s/s of distress noted.

## 2019-09-01 ENCOUNTER — Other Ambulatory Visit (HOSPITAL_COMMUNITY): Payer: Self-pay | Admitting: Hematology

## 2019-09-01 ENCOUNTER — Encounter (HOSPITAL_COMMUNITY): Payer: Self-pay

## 2019-09-01 ENCOUNTER — Other Ambulatory Visit (HOSPITAL_COMMUNITY): Payer: Self-pay | Admitting: *Deleted

## 2019-09-01 NOTE — Progress Notes (Signed)
Erroneous encounter

## 2019-09-06 ENCOUNTER — Other Ambulatory Visit: Payer: Self-pay

## 2019-09-06 ENCOUNTER — Inpatient Hospital Stay (HOSPITAL_COMMUNITY): Payer: BC Managed Care – PPO

## 2019-09-06 ENCOUNTER — Inpatient Hospital Stay (HOSPITAL_BASED_OUTPATIENT_CLINIC_OR_DEPARTMENT_OTHER): Payer: BC Managed Care – PPO | Admitting: Hematology

## 2019-09-06 ENCOUNTER — Encounter (HOSPITAL_COMMUNITY): Payer: Self-pay | Admitting: Hematology

## 2019-09-06 VITALS — BP 127/78 | HR 109 | Temp 97.6°F | Resp 18

## 2019-09-06 DIAGNOSIS — Z452 Encounter for adjustment and management of vascular access device: Secondary | ICD-10-CM | POA: Diagnosis not present

## 2019-09-06 DIAGNOSIS — C3490 Malignant neoplasm of unspecified part of unspecified bronchus or lung: Secondary | ICD-10-CM

## 2019-09-06 LAB — CBC WITH DIFFERENTIAL/PLATELET
Abs Immature Granulocytes: 0.02 10*3/uL (ref 0.00–0.07)
Basophils Absolute: 0 10*3/uL (ref 0.0–0.1)
Basophils Relative: 0 %
Eosinophils Absolute: 0 10*3/uL (ref 0.0–0.5)
Eosinophils Relative: 1 %
HCT: 36.6 % (ref 36.0–46.0)
Hemoglobin: 11.8 g/dL — ABNORMAL LOW (ref 12.0–15.0)
Immature Granulocytes: 0 %
Lymphocytes Relative: 13 %
Lymphs Abs: 0.7 10*3/uL (ref 0.7–4.0)
MCH: 32.9 pg (ref 26.0–34.0)
MCHC: 32.2 g/dL (ref 30.0–36.0)
MCV: 101.9 fL — ABNORMAL HIGH (ref 80.0–100.0)
Monocytes Absolute: 0.5 10*3/uL (ref 0.1–1.0)
Monocytes Relative: 9 %
Neutro Abs: 3.8 10*3/uL (ref 1.7–7.7)
Neutrophils Relative %: 77 %
Platelets: 76 10*3/uL — ABNORMAL LOW (ref 150–400)
RBC: 3.59 MIL/uL — ABNORMAL LOW (ref 3.87–5.11)
RDW: 16.7 % — ABNORMAL HIGH (ref 11.5–15.5)
WBC: 5 10*3/uL (ref 4.0–10.5)
nRBC: 0 % (ref 0.0–0.2)

## 2019-09-06 LAB — COMPREHENSIVE METABOLIC PANEL
ALT: 27 U/L (ref 0–44)
AST: 19 U/L (ref 15–41)
Albumin: 3.7 g/dL (ref 3.5–5.0)
Alkaline Phosphatase: 79 U/L (ref 38–126)
Anion gap: 9 (ref 5–15)
BUN: 13 mg/dL (ref 6–20)
CO2: 24 mmol/L (ref 22–32)
Calcium: 9.3 mg/dL (ref 8.9–10.3)
Chloride: 106 mmol/L (ref 98–111)
Creatinine, Ser: 0.96 mg/dL (ref 0.44–1.00)
GFR calc Af Amer: >60 mL/min (ref >60)
GFR calc non Af Amer: >60 mL/min (ref >60)
Glucose, Bld: 118 mg/dL — ABNORMAL HIGH (ref 70–99)
Potassium: 3.9 mmol/L (ref 3.5–5.1)
Sodium: 139 mmol/L (ref 135–145)
Total Bilirubin: 0.6 mg/dL (ref 0.3–1.2)
Total Protein: 7.1 g/dL (ref 6.5–8.1)

## 2019-09-06 LAB — MAGNESIUM: Magnesium: 1.7 mg/dL (ref 1.7–2.4)

## 2019-09-06 MED ORDER — SODIUM CHLORIDE 0.9% FLUSH
10.0000 mL | INTRAVENOUS | Status: DC | PRN
  Administered 2019-09-06: 10 mL
  Filled 2019-09-06: qty 10

## 2019-09-06 MED ORDER — SODIUM CHLORIDE 0.9 % IV SOLN
200.0000 mg | Freq: Once | INTRAVENOUS | Status: AC
  Administered 2019-09-06: 200 mg via INTRAVENOUS
  Filled 2019-09-06: qty 8

## 2019-09-06 MED ORDER — SODIUM CHLORIDE 0.9 % IV SOLN
Freq: Once | INTRAVENOUS | Status: AC
  Administered 2019-09-06: 10:00:00 via INTRAVENOUS

## 2019-09-06 MED ORDER — HEPARIN SOD (PORK) LOCK FLUSH 100 UNIT/ML IV SOLN
250.0000 [IU] | Freq: Once | INTRAVENOUS | Status: AC | PRN
  Administered 2019-09-06: 250 [IU]

## 2019-09-06 NOTE — Patient Instructions (Signed)
[facility] Discharge Instructions for Patients Receiving Chemotherapy  Today you received the following chemotherapy agents   To help prevent nausea and vomiting after your treatment, we encourage you to take your nausea medication   If you develop nausea and vomiting that is not controlled by your nausea medication, call the clinic.   BELOW ARE SYMPTOMS THAT SHOULD BE REPORTED IMMEDIATELY:  *FEVER GREATER THAN 100.5 F  *CHILLS WITH OR WITHOUT FEVER  NAUSEA AND VOMITING THAT IS NOT CONTROLLED WITH YOUR NAUSEA MEDICATION  *UNUSUAL SHORTNESS OF BREATH  *UNUSUAL BRUISING OR BLEEDING  TENDERNESS IN MOUTH AND THROAT WITH OR WITHOUT PRESENCE OF ULCERS  *URINARY PROBLEMS  *BOWEL PROBLEMS  UNUSUAL RASH Items with * indicate a potential emergency and should be followed up as soon as possible.  Feel free to call the clinic should you have any questions or concerns. The clinic phone number is [phone].  Please show the CHEMO ALERT CARD at check-in to the Emergency Department and triage nurse.   

## 2019-09-06 NOTE — Patient Instructions (Signed)
[place] at [facility] Discharge Instructions  Labs drawn from PICC line today   Thank you for choosing [place] at [facility] to provide your oncology and hematology care.  To afford each patient quality time with our provider, please arrive at least 15 minutes before your scheduled appointment time.   If you have a lab appointment with the [place] please come in thru the Main Entrance and check in at the main information desk.  You need to re-schedule your appointment should you arrive 10 or more minutes late.  We strive to give you quality time with our providers, and arriving late affects you and other patients whose appointments are after yours.  Also, if you no show three or more times for appointments you may be dismissed from the clinic at the providers discretion.     Again, thank you for choosing [facility].  Our hope is that these requests will decrease the amount of time that you wait before being seen by our physicians.       _____________________________________________________________  Should you have questions after your visit to [facility], please contact our office at [phone] between the hours of 8:00 a.m. and 4:30 p.m.  Voicemails left after 4:00 p.m. will not be returned until the following business day.  For prescription refill requests, have your pharmacy contact our office and allow 72 hours.    Due to Covid, you will need to wear a mask upon entering the hospital. If you do not have a mask, a mask will be given to you at the Main Entrance upon arrival. For doctor visits, patients may have 1 support person with them. For treatment visits, patients can not have anyone with them due to social distancing guidelines and our immunocompromised population.

## 2019-09-06 NOTE — Assessment & Plan Note (Addendum)
1.  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 -Palliative XRT to the chest completed on 03/31/2019. -PET scan on 04/04/2019 showed marked hypermetabolic nodal disease in the right supraclavicular region, right thoracic inlet, high right paratracheal station.  Lymphadenopathy in the right neck, both axilla show low-level FDG metabolism.  Markedly hypermetabolic bilateral adrenal nodules. -7 cycles of carboplatin, paclitaxel and pembrolizumab from 04/08/2019 through 08/16/2019. -CT CAP on 08/11/2019 shows continuous improvement with decrease in size of the metastatic disease.  Right supraclavicular soft tissue measures 3.1 x 1.8, previously 3.4 x 2.7 cm.  Soft tissue at the level of mid right paratracheal chain measures 2.4 x 2.0 cm.  Adrenal glands are unremarkable.  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  SVC remains patent via left brachiocephalic vein. -She was given chemotherapy inadvertently on 08/16/2019.  Our intention was to introduce maintenance pembrolizumab after cycle 6.  I have informed this to the patient. -I have reviewed her labs.  She may proceed with her maintenance pembrolizumab today.  So far she has not had any immunotherapy related side effects. -We will see her back in 3 weeks for follow-up.  2.  Left basilic vein thrombus: -Ultrasound on 05/03/2019 was positive for nonocclusive thrombus around the PICC line in the left basilic vein, considered superficial thrombus.  No evidence of DVT. -Because of her active malignancy, we are continuing full dose anticoagulation with Eliquis.  She is tolerating it very well without any bleeding issues.  3.  Right shoulder pain: -She is doing physical therapy and lymphedema exercises.  Pain is well controlled.  She takes oxycodone as  needed.  4.  Peripheral neuropathy: -She has constant numbness in the fingertips and toes.  We will closely watch it.  She does not have any pains at this time.

## 2019-09-06 NOTE — Progress Notes (Signed)
09/06/19  OK to treat with HR 107 - she runs high. Will obtain TSH with next dose of Keytruda.  T.O. Dr Katragadda/Catherine Page, RN/Reford Olliff Ronnald Ramp, PharmD

## 2019-09-06 NOTE — Progress Notes (Signed)
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  BRIEF ONCOLOGIC HISTORY:  Oncology History  Squamous cell lung cancer, unspecified laterality ([place])  04/05/2019 Initial Diagnosis   Squamous cell lung cancer, unspecified laterality ([place])   04/08/2019 -  Chemotherapy   The patient had palonosetron (ALOXI) injection 0.25 mg, 0.25 mg, Intravenous,  Once, 7 of 7 cycles Administration: 0.25 mg (04/08/2019), 0.25 mg (05/02/2019), 0.25 mg (05/23/2019), 0.25 mg (06/13/2019), 0.25 mg (07/05/2019), 0.25 mg (07/26/2019), 0.25 mg (08/16/2019) pegfilgrastim (NEULASTA) injection 6 mg, 6 mg, Subcutaneous, Once, 2 of 2 cycles Administration: 6 mg (04/11/2019), 6 mg (05/04/2019) pegfilgrastim (NEULASTA ONPRO KIT) injection 6 mg, 6 mg, Subcutaneous, Once, 3 of 3 cycles pegfilgrastim-cbqv (UDENYCA) injection 6 mg, 6 mg, Subcutaneous, Once, 5 of 5 cycles Administration: 6 mg (05/25/2019), 6 mg (06/15/2019), 6 mg (07/07/2019), 6 mg (07/28/2019), 6 mg (08/18/2019) CARBOplatin (PARAPLATIN) 900 mg in sodium chloride 0.9 % 500 mL chemo infusion, 900 mg (100 % of original dose 900 mg), Intravenous,  Once, 7 of 7 cycles Dose modification:   (original dose 900 mg, Cycle 1),   (original dose 900 mg, Cycle 2),   (original dose 900 mg, Cycle 3),   (original dose 900 mg, Cycle 4) Administration: 900 mg (04/08/2019), 900 mg (05/02/2019), 900 mg (05/23/2019), 900 mg (06/13/2019), 900 mg (07/05/2019), 900 mg (07/26/2019), 900 mg (08/16/2019) PACLitaxel (TAXOL) 492 mg in sodium chloride 0.9 % 500 mL chemo infusion (> '80mg'$ /m2), 200 mg/m2 = 492 mg, Intravenous,  Once, 7 of 7 cycles Dose modification: 175 mg/m2 (original dose 200 mg/m2, Cycle 6, Reason: Other (see comments), Comment:  neuropathy) Administration: 492 mg (04/08/2019), 492 mg (05/02/2019), 492 mg (05/23/2019), 492 mg (06/13/2019), 492 mg (07/05/2019), 432 mg (07/26/2019), 432 mg (08/16/2019) pembrolizumab (KEYTRUDA) 200 mg in sodium chloride 0.9 % 50 mL chemo infusion, 200 mg, Intravenous, Once, 8 of 12 cycles Administration: 200 mg (04/08/2019), 200 mg (07/05/2019), 200 mg (05/02/2019), 200 mg (05/23/2019), 200 mg (06/13/2019), 200 mg (07/26/2019), 200 mg (08/16/2019), 200 mg (09/06/2019) fosaprepitant (EMEND) 150 mg, dexamethasone (DECADRON) 12 mg in sodium chloride 0.9 % 145 mL IVPB, , Intravenous,  Once, 7 of 7 cycles Administration:  (04/08/2019),  (05/02/2019),  (05/23/2019),  (06/13/2019),  (07/05/2019),  (07/26/2019),  (08/16/2019)  for chemotherapy treatment.        INTERVAL HISTORY:  Tara Aguilar 38 y.o. female seen for follow-up of metastatic squamous cell carcinoma of the lung.  She reported numbness in the fingertips and toes after last cycle of chemotherapy which is constant.  She had intermittent numbness prior to that.  She has difficulty picking up small things.  Reports appetite 800%.  Energy levels are low.  She reports some pain in the shoulder region around 4-5 out of 10.  No fevers or chills reported.  Denies any GI symptoms including nausea, vomiting, diarrhea or constipation.  No skin rashes.   REVIEW OF SYSTEMS:  Review of Systems  Neurological: Positive for numbness.  All other systems reviewed and are negative.    PAST MEDICAL/SURGICAL HISTORY:  Past Medical History:  Diagnosis Date   Depression    Glucose intolerance 03/11/2019   Obesity, Class III, BMI 40-49.9 (morbid obesity) ([place]) 03/11/2019   Past Surgical History:  Procedure Laterality Date  CESAREAN SECTION     IRRIGATION AND DEBRIDEMENT ABSCESS  03/15/2019   RETROPHARYNGEAL    TONSILLECTOMY AND ADENOIDECTOMY N/A 03/15/2019   Procedure: IRRIGATION AND DEBRIDEMENT OF RETROPHARYNGEAL ABSCESS;  Surgeon: Leta Baptist, MD;  Location: [facility];   Service: ENT;  Laterality: N/A;     SOCIAL HISTORY:  Social History   Socioeconomic History   Marital status: Widowed    Spouse name: Not on file   Number of children: 1   Years of education: Not on file   Highest education level: Not on file  Occupational History   Not on file  Social Needs   Financial resource strain: Not very hard   Food insecurity    Worry: Never true    Inability: Never true   Transportation needs    Medical: No    Non-medical: No  Tobacco Use   Smoking status: Current Every Day Smoker    Packs/day: 1.00    Years: 25.00    Pack years: 25.00    Types: Cigarettes   Smokeless tobacco: Never Used  Substance and Sexual Activity   Alcohol use: Not Currently    Comment: social    Drug use: Not Currently   Sexual activity: Not on file  Lifestyle   Physical activity    Days per week: 0 days    Minutes per session: 0 min   Stress: Only a little  Relationships   Social connections    Talks on phone: More than three times a week    Gets together: Twice a week    Attends religious service: Never    Active member of club or organization: No    Attends meetings of clubs or organizations: Never    Relationship status: Widowed   Intimate partner violence    Fear of current or ex partner: No    Emotionally abused: No    Physically abused: No    Forced sexual activity: No  Other Topics Concern   Not on file  Social History Narrative   04-06-19   Husband passed away four months ago from Cancer of the [place].   Pt. Has 4 yo son at home.    FAMILY HISTORY:  Family History  Problem Relation Age of Onset   Lung cancer Mother    Hypertension Mother    Leukemia Father    Myelodysplastic syndrome Father     CURRENT MEDICATIONS:  Outpatient Encounter Medications as of 09/06/2019  Medication Sig   apixaban (ELIQUIS) 5 MG TABS tablet Take 1 tablet (5 mg total) by mouth 2 (two) times daily.   CARBOPLATIN IV Inject into the  vein every 21 ( twenty-one) days. X 4 cycles   erythromycin ophthalmic ointment See admin instructions.   furosemide (LASIX) 20 MG tablet Take 1 tablet (20 mg total) by mouth once as needed for up to 1 dose (Take '20mg'$  once daily as needed).   gabapentin (NEURONTIN) 300 MG capsule Take 1 capsule (300 mg total) by mouth 2 (two) times daily.   nystatin (MYCOSTATIN/NYSTOP) powder Apply topically as needed.    oxyCODONE (OXY IR/ROXICODONE) 5 MG immediate release tablet Take 1 tablet (5 mg total) by mouth every 4 (four) hours as needed for severe pain.   PACLitaxel (TAXOL IV) Inject into the vein every 21 ( twenty-one) days. X 4 cycles   PEMBROLIZUMAB IV Inject into the vein every 21 ( twenty-one) days.   prochlorperazine (COMPAZINE) 10 MG tablet Take 1 tablet (10 mg total) by mouth every 6 (  six) hours as needed (Nausea or vomiting).   No facility-administered encounter medications on file as of 09/06/2019.     ALLERGIES:  No Known Allergies   PHYSICAL EXAM:  ECOG Performance status: 1  Vitals:   09/06/19 0902  BP: 120/89  Pulse: (!) 117  Resp: 20  Temp: (!) 97.5 F (36.4 C)  SpO2: 97%   Filed Weights   09/06/19 0902  Weight: 263 lb 12.8 oz (119.7 kg)    Physical Exam Constitutional:      Appearance: She is obese.     Comments: Facial edema   HENT:     Mouth/Throat:     Mouth: Mucous membranes are moist.     Pharynx: Oropharynx is clear.  Eyes:     Extraocular Movements: Extraocular movements intact.     Conjunctiva/sclera: Conjunctivae normal.  Neck:     Musculoskeletal: Normal range of motion.  Cardiovascular:     Rate and Rhythm: Normal rate and regular rhythm.     Heart sounds: Normal heart sounds.  Pulmonary:     Effort: Pulmonary effort is normal.     Breath sounds: Normal breath sounds.  Abdominal:     General: Bowel sounds are normal. There is no distension.     Palpations: There is no mass.  Musculoskeletal: Normal range of motion.  Skin:     General: Skin is warm.  Neurological:     General: No focal deficit present.     Mental Status: She is alert and oriented to person, place, and time.  Psychiatric:        Mood and Affect: Mood normal.        Behavior: Behavior normal.        Thought Content: Thought content normal.        Judgment: Judgment normal.      LABORATORY DATA:  I have reviewed the labs as listed.  CBC    Component Value Date/Time   WBC 5.0 09/06/2019 0857   RBC 3.59 (L) 09/06/2019 0857   HGB 11.8 (L) 09/06/2019 0857   HCT 36.6 09/06/2019 0857   PLT 76 (L) 09/06/2019 0857   MCV 101.9 (H) 09/06/2019 0857   MCH 32.9 09/06/2019 0857   MCHC 32.2 09/06/2019 0857   RDW 16.7 (H) 09/06/2019 0857   LYMPHSABS 0.7 09/06/2019 0857   MONOABS 0.5 09/06/2019 0857   EOSABS 0.0 09/06/2019 0857   BASOSABS 0.0 09/06/2019 0857   CMP Latest Ref Rng & Units 09/06/2019 08/16/2019 07/26/2019  Glucose 70 - 99 mg/dL 118(H) 157(H) 115(H)  BUN 6 - 20 mg/dL '13 14 12  '$ Creatinine 0.44 - 1.00 mg/dL 0.96 1.02(H) 0.88  Sodium 135 - 145 mmol/L 139 141 138  Potassium 3.5 - 5.1 mmol/L 3.9 3.9 3.9  Chloride 98 - 111 mmol/L 106 108 104  CO2 22 - 32 mmol/L '24 24 25  '$ Calcium 8.9 - 10.3 mg/dL 9.3 8.8(L) 9.5  Total Protein 6.5 - 8.1 g/dL 7.1 6.8 7.3  Total Bilirubin 0.3 - 1.2 mg/dL 0.6 0.4 0.4  Alkaline Phos 38 - 126 U/L 79 70 63  AST 15 - 41 U/L '19 19 23  '$ ALT 0 - 44 U/L 27 25 34      ASSESSMENT & PLAN:   Squamous cell lung cancer, unspecified laterality ([place]) 1.  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 -Palliative XRT to the chest completed on 03/31/2019. -PET scan on 04/04/2019 showed marked hypermetabolic nodal disease in the right supraclavicular region, right thoracic inlet, high right paratracheal station.  Lymphadenopathy in the  right neck, both axilla show low-level FDG metabolism.  Markedly hypermetabolic bilateral adrenal nodules. -7 cycles of carboplatin, paclitaxel and pembrolizumab from 04/08/2019 through 08/16/2019. -CT CAP on 08/11/2019 shows continuous improvement with decrease in size of the metastatic disease.  Right supraclavicular soft tissue measures 3.1 x 1.8, previously 3.4 x 2.7 cm.  Soft tissue at the level of mid right paratracheal chain measures 2.4 x 2.0 cm.  Adrenal glands are unremarkable.  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  SVC remains patent via left brachiocephalic vein. -She was given chemotherapy inadvertently on 08/16/2019.  Our intention was to introduce maintenance pembrolizumab after cycle 6.  I have informed this to the patient. -I have reviewed her labs.  She may proceed with her maintenance pembrolizumab today.  So far she has not had any immunotherapy related side effects. -We will see her back in 3 weeks for follow-up.  2.  Left basilic vein thrombus: -Ultrasound on 05/03/2019 was positive for nonocclusive thrombus around the PICC line in the left basilic vein, considered superficial thrombus.  No evidence of DVT. -Because of her active malignancy, we are continuing full dose anticoagulation with Eliquis.  She is tolerating it very well without any bleeding issues.  3.  Right shoulder pain: -She is doing physical therapy and lymphedema exercises.  Pain is well controlled.  She takes oxycodone as needed.  4.  Peripheral neuropathy: -She has constant numbness in the fingertips and toes.  We will closely watch it.  She does not have any pains at this time.   Total time spent is 25 minutes with more than 50% of the time spent face-to-face discussing treatment plan, counseling and coordination of care.    Derek Jack, MD [place] [phone]

## 2019-09-06 NOTE — Progress Notes (Signed)
Labs reviewed today and will give Keytruda only per MD.   Treatment given per orders. Patient tolerated it well without problems. Vitals stable and discharged home from clinic via wheelchair.  Follow up as scheduled.

## 2019-09-06 NOTE — Patient Instructions (Signed)
[place] at [facility] Discharge Instructions  You were seen today by Dr. Delton Coombes. He went over your recent lab results. He will see you back in 3 weeks for labs, treatment and follow up.   Thank you for choosing [place] at [facility] to provide your oncology and hematology care.  To afford each patient quality time with our provider, please arrive at least 15 minutes before your scheduled appointment time.   If you have a lab appointment with the [facility] please come in thru the  Main Entrance and check in at the main information desk  You need to re-schedule your appointment should you arrive 10 or more minutes late.  We strive to give you quality time with our providers, and arriving late affects you and other patients whose appointments are after yours.  Also, if you no show three or more times for appointments you may be dismissed from the clinic at the providers discretion.     Again, thank you for choosing [facility].  Our hope is that these requests will decrease the amount of time that you wait before being seen by our physicians.       _____________________________________________________________  Should you have questions after your visit to [facility], please contact our office at [phone] between the hours of 8:00 a.m. and 4:30 p.m.  Voicemails left after 4:00 p.m. will not be returned until the following business day.  For prescription refill requests, have your pharmacy contact our office and allow 72 hours.    Cancer Center Support Programs:   > Cancer Support Group  2nd Tuesday of the month 1pm-2pm, Journey Room

## 2019-09-08 ENCOUNTER — Ambulatory Visit (HOSPITAL_COMMUNITY): Payer: BC Managed Care – PPO

## 2019-09-13 ENCOUNTER — Other Ambulatory Visit: Payer: Self-pay

## 2019-09-13 ENCOUNTER — Inpatient Hospital Stay (HOSPITAL_COMMUNITY): Payer: BC Managed Care – PPO

## 2019-09-13 DIAGNOSIS — Z452 Encounter for adjustment and management of vascular access device: Secondary | ICD-10-CM | POA: Diagnosis not present

## 2019-09-13 MED ORDER — HEPARIN SOD (PORK) LOCK FLUSH 100 UNIT/ML IV SOLN
500.0000 [IU] | Freq: Once | INTRAVENOUS | Status: AC
  Administered 2019-09-13: 500 [IU] via INTRAVENOUS

## 2019-09-13 MED ORDER — SODIUM CHLORIDE 0.9% FLUSH
10.0000 mL | Freq: Once | INTRAVENOUS | Status: AC
  Administered 2019-09-13: 10 mL

## 2019-09-13 NOTE — Patient Instructions (Signed)
[facility] at [facility]  Discharge Instructions:   _______________________________________________________________  Thank you for choosing [facility] at [facility] to provide your oncology and hematology care.  To afford each patient quality time with our providers, please arrive at least 15 minutes before your scheduled appointment.  You need to re-schedule your appointment if you arrive 10 or more minutes late.  We strive to give you quality time with our providers, and arriving late affects you and other patients whose appointments are after yours.  Also, if you no show three or more times for appointments you may be dismissed from the clinic.  Again, thank you for choosing [facility] at [facility]. Our hope is that these requests will allow you access to exceptional care and in a timely manner. _______________________________________________________________  If you have questions after your visit, please contact our office at [phone] between the hours of 8:30 a.m. and 5:00 p.m. Voicemails left after 4:30 p.m. will not be returned until the following business day. _______________________________________________________________  For prescription refill requests, have your pharmacy contact our office. _______________________________________________________________  Recommendations made by the consultant and any test results will be sent to your referring physician. _______________________________________________________________ 

## 2019-09-20 ENCOUNTER — Other Ambulatory Visit: Payer: Self-pay

## 2019-09-20 ENCOUNTER — Inpatient Hospital Stay (HOSPITAL_COMMUNITY): Payer: BC Managed Care – PPO

## 2019-09-20 ENCOUNTER — Encounter (HOSPITAL_COMMUNITY): Payer: Self-pay

## 2019-09-20 DIAGNOSIS — Z452 Encounter for adjustment and management of vascular access device: Secondary | ICD-10-CM | POA: Diagnosis not present

## 2019-09-20 MED ORDER — SODIUM CHLORIDE 0.9% FLUSH
3.0000 mL | INTRAVENOUS | Status: DC | PRN
  Administered 2019-09-20: 12:00:00 3 mL via INTRAVENOUS
  Filled 2019-09-20: qty 10

## 2019-09-20 MED ORDER — HEPARIN SOD (PORK) LOCK FLUSH 100 UNIT/ML IV SOLN
250.0000 [IU] | Freq: Once | INTRAVENOUS | Status: AC
  Administered 2019-09-20: 250 [IU] via INTRAVENOUS

## 2019-09-20 NOTE — Progress Notes (Signed)
Nani Skillern tolerated PICC line dressing change and flush well without complaints or incident. PICC line flushed per protocol with caps changed on both lumens. PICC line site without redness or drainage noted. VSS Pt discharged via wheelchair in satisfactory condition

## 2019-09-20 NOTE — Patient Instructions (Signed)
[place] at [facility] Discharge Instructions  PICC line dressing changed and flushed per protocol. Followup as scheduled. Call clinic for any questions or concerns   Thank you for choosing [place] at [facility] to provide your oncology and hematology care.  To afford each patient quality time with our provider, please arrive at least 15 minutes before your scheduled appointment time.   If you have a lab appointment with the [facility] please come in thru the Main Entrance and check in at the main information desk.  You need to re-schedule your appointment should you arrive 10 or more minutes late.  We strive to give you quality time with our providers, and arriving late affects you and other patients whose appointments are after yours.  Also, if you no show three or more times for appointments you may be dismissed from the clinic at the providers discretion.     Again, thank you for choosing [facility].  Our hope is that these requests will decrease the amount of time that you wait before being seen by our physicians.       _____________________________________________________________  Should you have questions after your visit to [facility], please contact our office at [phone] between the hours of 8:00 a.m. and 4:30 p.m.  Voicemails left after 4:00 p.m. will not be returned until the following business day.  For prescription refill requests, have your pharmacy contact our office and allow 72 hours.    Due to Covid, you will need to wear a mask upon entering the hospital. If you do not have a mask, a mask will be given to you at the Main Entrance upon arrival. For doctor visits, patients may have 1 support person with them. For treatment visits, patients can not have anyone with them due to social distancing guidelines and our immunocompromised population.

## 2019-09-26 ENCOUNTER — Other Ambulatory Visit: Payer: Self-pay

## 2019-09-27 ENCOUNTER — Inpatient Hospital Stay (HOSPITAL_COMMUNITY): Payer: BC Managed Care – PPO

## 2019-09-27 ENCOUNTER — Encounter (HOSPITAL_COMMUNITY): Payer: Self-pay | Admitting: Hematology

## 2019-09-27 ENCOUNTER — Inpatient Hospital Stay (HOSPITAL_BASED_OUTPATIENT_CLINIC_OR_DEPARTMENT_OTHER): Payer: BC Managed Care – PPO | Admitting: Hematology

## 2019-09-27 ENCOUNTER — Inpatient Hospital Stay (HOSPITAL_COMMUNITY): Payer: BC Managed Care – PPO | Attending: Hematology

## 2019-09-27 VITALS — BP 118/54 | HR 111 | Temp 97.4°F | Resp 20

## 2019-09-27 DIAGNOSIS — R0602 Shortness of breath: Secondary | ICD-10-CM | POA: Insufficient documentation

## 2019-09-27 DIAGNOSIS — F17210 Nicotine dependence, cigarettes, uncomplicated: Secondary | ICD-10-CM | POA: Diagnosis not present

## 2019-09-27 DIAGNOSIS — M25511 Pain in right shoulder: Secondary | ICD-10-CM | POA: Insufficient documentation

## 2019-09-27 DIAGNOSIS — G629 Polyneuropathy, unspecified: Secondary | ICD-10-CM | POA: Insufficient documentation

## 2019-09-27 DIAGNOSIS — Z79899 Other long term (current) drug therapy: Secondary | ICD-10-CM | POA: Insufficient documentation

## 2019-09-27 DIAGNOSIS — R05 Cough: Secondary | ICD-10-CM | POA: Insufficient documentation

## 2019-09-27 DIAGNOSIS — C3490 Malignant neoplasm of unspecified part of unspecified bronchus or lung: Secondary | ICD-10-CM | POA: Insufficient documentation

## 2019-09-27 DIAGNOSIS — Z86718 Personal history of other venous thrombosis and embolism: Secondary | ICD-10-CM | POA: Insufficient documentation

## 2019-09-27 DIAGNOSIS — Z5112 Encounter for antineoplastic immunotherapy: Secondary | ICD-10-CM | POA: Insufficient documentation

## 2019-09-27 DIAGNOSIS — Z7901 Long term (current) use of anticoagulants: Secondary | ICD-10-CM | POA: Diagnosis not present

## 2019-09-27 DIAGNOSIS — C779 Secondary and unspecified malignant neoplasm of lymph node, unspecified: Secondary | ICD-10-CM

## 2019-09-27 LAB — CBC WITH DIFFERENTIAL/PLATELET
Abs Immature Granulocytes: 0.02 10*3/uL (ref 0.00–0.07)
Basophils Absolute: 0 10*3/uL (ref 0.0–0.1)
Basophils Relative: 1 %
Eosinophils Absolute: 0.1 10*3/uL (ref 0.0–0.5)
Eosinophils Relative: 2 %
HCT: 37 % (ref 36.0–46.0)
Hemoglobin: 11.9 g/dL — ABNORMAL LOW (ref 12.0–15.0)
Immature Granulocytes: 0 %
Lymphocytes Relative: 14 %
Lymphs Abs: 0.7 10*3/uL (ref 0.7–4.0)
MCH: 33.1 pg (ref 26.0–34.0)
MCHC: 32.2 g/dL (ref 30.0–36.0)
MCV: 102.8 fL — ABNORMAL HIGH (ref 80.0–100.0)
Monocytes Absolute: 0.5 10*3/uL (ref 0.1–1.0)
Monocytes Relative: 10 %
Neutro Abs: 3.9 10*3/uL (ref 1.7–7.7)
Neutrophils Relative %: 73 %
Platelets: 155 10*3/uL (ref 150–400)
RBC: 3.6 MIL/uL — ABNORMAL LOW (ref 3.87–5.11)
RDW: 15.3 % (ref 11.5–15.5)
WBC: 5.3 10*3/uL (ref 4.0–10.5)
nRBC: 0 % (ref 0.0–0.2)

## 2019-09-27 LAB — COMPREHENSIVE METABOLIC PANEL
ALT: 27 U/L (ref 0–44)
AST: 20 U/L (ref 15–41)
Albumin: 3.6 g/dL (ref 3.5–5.0)
Alkaline Phosphatase: 63 U/L (ref 38–126)
Anion gap: 9 (ref 5–15)
BUN: 13 mg/dL (ref 6–20)
CO2: 24 mmol/L (ref 22–32)
Calcium: 9.1 mg/dL (ref 8.9–10.3)
Chloride: 105 mmol/L (ref 98–111)
Creatinine, Ser: 0.95 mg/dL (ref 0.44–1.00)
GFR calc Af Amer: >60 mL/min (ref >60)
GFR calc non Af Amer: >60 mL/min (ref >60)
Glucose, Bld: 110 mg/dL — ABNORMAL HIGH (ref 70–99)
Potassium: 3.6 mmol/L (ref 3.5–5.1)
Sodium: 138 mmol/L (ref 135–145)
Total Bilirubin: 0.6 mg/dL (ref 0.3–1.2)
Total Protein: 7 g/dL (ref 6.5–8.1)

## 2019-09-27 MED ORDER — SODIUM CHLORIDE 0.9 % IV SOLN
200.0000 mg | Freq: Once | INTRAVENOUS | Status: AC
  Administered 2019-09-27: 200 mg via INTRAVENOUS
  Filled 2019-09-27: qty 8

## 2019-09-27 MED ORDER — SODIUM CHLORIDE 0.9% FLUSH
10.0000 mL | Freq: Once | INTRAVENOUS | Status: AC
  Administered 2019-09-27: 10 mL

## 2019-09-27 MED ORDER — SODIUM CHLORIDE 0.9 % IV SOLN
Freq: Once | INTRAVENOUS | Status: AC
  Administered 2019-09-27: 11:00:00 via INTRAVENOUS

## 2019-09-27 NOTE — Patient Instructions (Addendum)
PICC Removal, Adult, Care After This sheet gives you information about how to care for yourself after your procedure. Your health care provider may also give you more specific instructions. If you have problems or questions, contact your health care provider. What can I expect after the procedure? After your procedure, it is common to have:  Tenderness or soreness.  Redness, swelling, or a scab where the PICC was removed (exit site). Follow these instructions at home: For the first 24 hours after the procedure   Keep the bandage (dressing) on the exit site clean and dry. Do not remove the dressing until your health care provider tells you to do so.  Check your arm often for signs and symptoms of an infection. Check for: ? A red streak that spreads away from the dressing. ? Blood or fluid that you can see on the dressing. ? More redness or swelling.  Do not lift anything heavy or do activities that require great effort until your health care provider says it is okay. You should avoid: ? Lifting weights. ? Yard work. ? Any physical activity with repetitive arm movement.  Watch closely for any signs of an air bubble in the vein (air embolism). This is a rare but serious complication. If you have signs of air embolism, call 911 immediately and lie down on your left side to keep the air from moving into the lungs. Signs of an air embolism include: ? Difficulty breathing. ? Chest pain. ? Coughing or wheezing. ? Skin that is pale, blue, cold, or clammy. ? Rapid pulse. ? Rapid breathing. ? Fainting. After 24 Hours have passed:  Remove your dressing as told by your health care provider. Make sure you wash your hands with soap and water before and after you change the dressing. If soap and water are not available, use hand sanitizer.  Return to your normal activities as told by your health care provider.  A small scab may develop over the exit site. Do not pick at the scab.  When  bathing or showering, gently wash the exit site with soap and water. Pat it dry.  Watch for signs of infection, such as: ? Fever or chills. ? Swollen glands under the arm. ? More redness, swelling, or soreness in the arm. ? Blood, fluid, or pus coming from the exit site. ? Warmth or a bad smell at the exit site. ? A red streak spreading away from the exit site. General instructions  Take over-the-counter and prescription medicines only as told by your health care provider. Do not take any new medicines without checking with your health care provider first.  If you were prescribed an antibiotic medicine, apply or take it as told by your health care provider. Do not stop using the antibiotic even if your condition improves.  Keep all follow-up visits as told by your health care provider. This is important. Contact a health care provider if:  You have a fever or chills.  You have soreness, redness, or swelling on your exit site, and it gets worse.  You have swollen glands under your arm.  You have any of the following symptoms at your exit site: ? Blood, fluid, or pus. ? Unusual warmth. ? A bad smell. ? A red streak spreading away from the exit site. Get help right away if:  You have numbness or tingling in your fingers, hand, or arm.  Your arm looks blue and feels cold.  You have signs of an air embolism, such  as: ? Difficulty breathing. ? Chest pain. ? Coughing or wheezing. ? Skin that is pale, blue, cold, or clammy. ? Rapid pulse. ? Rapid breathing. ? Fainting. These symptoms may represent a serious problem that is an emergency. Do not wait to see if the symptoms will go away. Get medical help right away. Call your local emergency services (911 in the [place] Do not drive yourself to the hospital. Summary  After your procedure, it is common to have tenderness or soreness, redness, swelling, or a scab at the exit site.  Keep the dressing over the exit site clean and dry.  Do not remove the dressing until your health care provider tells you to do so.  Do not lift anything heavy or do activities that require great effort until your health care provider says it is okay.  Watch closely for any signs of an air embolism. If you have signs of air embolism, call 911 immediately and lie down on your left side. This information is not intended to replace advice given to you by your health care provider. Make sure you discuss any questions you have with your health care provider. Document Released: 10/18/2013 Document Revised: 09/25/2017 Document Reviewed: 12/09/2016 Elsevier Patient Education  2020 [place] Discharge Instructions for Patients Receiving Chemotherapy  Today you received the following chemotherapy agents   To help prevent nausea and vomiting after your treatment, we encourage you to take your nausea medication    If you develop nausea and vomiting that is not controlled by your nausea medication, call the clinic.   BELOW ARE SYMPTOMS THAT SHOULD BE REPORTED IMMEDIATELY:  *FEVER GREATER THAN 100.5 F  *CHILLS WITH OR WITHOUT FEVER  NAUSEA AND VOMITING THAT IS NOT CONTROLLED WITH YOUR NAUSEA MEDICATION  *UNUSUAL SHORTNESS OF BREATH  *UNUSUAL BRUISING OR BLEEDING  TENDERNESS IN MOUTH AND THROAT WITH OR WITHOUT PRESENCE OF ULCERS  *URINARY PROBLEMS  *BOWEL PROBLEMS  UNUSUAL RASH Items with * indicate a potential emergency and should be followed up as soon as possible.  Feel free to call the clinic should you have any questions or concerns. The clinic phone number is [phone].  Please show the [facility] at check-in to the Emergency Department and triage nurse.

## 2019-09-27 NOTE — Assessment & Plan Note (Signed)
1.  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 -Palliative XRT to the chest completed on 03/31/2019. -PET scan on 04/04/2019 showed marked hypermetabolic nodal disease in the right supraclavicular region, right thoracic inlet, high right paratracheal station.  Lymphadenopathy in the right neck, both axilla show low-level FDG metabolism.  Markedly hypermetabolic bilateral adrenal nodules. -7 cycles of carboplatin, paclitaxel and pembrolizumab from 04/08/2019 through 08/16/2019. -CT CAP on 08/11/2019 shows continuous improvement with decrease in size of the metastatic disease.  Right supraclavicular soft tissue measures 3.1 x 1.8, previously 3.4 x 2.7 cm.  Soft tissue at the level of mid right paratracheal chain measures 2.4 x 2.0 cm.  Adrenal glands are unremarkable.  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  SVC remains patent via left brachiocephalic vein. -We started maintenance Keytruda on 09/06/2019.  She tolerated it very well without any major chemotherapy related side effects or tiredness. -I have reviewed her labs.  She will proceed with her next treatment today. -She wants to have her PICC line taken out as she is not able to do all her activities at home.  We will try her peripheral line today.  If we are able to get access easily, will discontinue PICC line.  2.  Left basilic vein thrombus: -Ultrasound on 05/03/2019 was positive for nonocclusive thrombus around the PICC line in the left basilic vein, considered superficial thrombus.  No evidence of DVT. -Because of her active malignancy, full dose anticoagulation with Eliquis was recommended.  3.  Right shoulder pain: -This has improved since treatment.  She occasionally gets pain and takes oxycodone and Aleve. -She is also taking  oxycodone at bedtime for adequate sleep.  4.  Peripheral neuropathy: -She has constant numbness in the fingertips and toes.  Occasionally the burn. -I will increase gabapentin to 300 mg 3 times a day.

## 2019-09-27 NOTE — Progress Notes (Signed)
Dr. Delton Coombes aware of pt's tachycardia with HR of 121.  Recheck HR 114.  Okay to tx today per MD.

## 2019-09-27 NOTE — Progress Notes (Signed)
          Message received from [facility] LPN . Per Dr. Delton Coombes, "May pull PICC line if staff is able to find a vein and feel comfortable." Pt requests PICC line to be removed.

## 2019-09-27 NOTE — Patient Instructions (Signed)
[facility] at [facility] Discharge Instructions  You were seen today by Dr. Katragadda. He went over your recent lab results. He will see you back in 3 weeks for labs and follow up.   Thank you for choosing [facility] at [facility] to provide your oncology and hematology care.  To afford each patient quality time with our provider, please arrive at least 15 minutes before your scheduled appointment time.   If you have a lab appointment with the Cancer Center please come in thru the  Main Entrance and check in at the main information desk  You need to re-schedule your appointment should you arrive 10 or more minutes late.  We strive to give you quality time with our providers, and arriving late affects you and other patients whose appointments are after yours.  Also, if you no show three or more times for appointments you may be dismissed from the clinic at the providers discretion.     Again, thank you for choosing [facility].  Our hope is that these requests will decrease the amount of time that you wait before being seen by our physicians.       _____________________________________________________________  Should you have questions after your visit to [facility], please contact our office at [phone] between the hours of 8:00 a.m. and 4:30 p.m.  Voicemails left after 4:00 p.m. will not be returned until the following business day.  For prescription refill requests, have your pharmacy contact our office and allow 72 hours.    Cancer Center Support Programs:   > Cancer Support Group  2nd Tuesday of the month 1pm-2pm, Journey Room    

## 2019-09-27 NOTE — Progress Notes (Signed)
Pt presents today for Keytruda and follow up appointment with Dr. Delton Coombes. Labs reviewed. Pt requests PICC line removal today. Per Dr. Delton Coombes. May infuse Keytruda via peripheral today.   Message received via [facility] LPN may remove PICC line today.   12:48 Pt laid back supine. PICC line removed from the left arm double lumen. 46 cm catheter length. Pressure dressing applied with Vaseline gauze. Procedure without incident. Patient tolerated procedure well. PICC removal care printed and given to patient. Understanding verbalized.   Treatment given today per MD orders. Tolerated infusion without adverse affects. Vital signs stable. No complaints at this time. Discharged from clinic ambulatory. F/U with [facility] as scheduled.

## 2019-09-27 NOTE — Progress Notes (Signed)
[place], [place] [address]   CLINIC:  Medical Oncology/Hematology  PCP:  Glenda Chroman, MD [address] [place] [address] [phone]   REASON FOR VISIT:  Follow-up for squamous cell lung cancer to the adrenals  CURRENT THERAPY: Pembrolizumab maintenance.  BRIEF ONCOLOGIC HISTORY:  Oncology History  Squamous cell lung cancer, unspecified laterality ([place])  04/05/2019 Initial Diagnosis   Squamous cell lung cancer, unspecified laterality ([place])   04/08/2019 -  Chemotherapy   The patient had palonosetron (ALOXI) injection 0.25 mg, 0.25 mg, Intravenous,  Once, 7 of 7 cycles Administration: 0.25 mg (04/08/2019), 0.25 mg (05/02/2019), 0.25 mg (05/23/2019), 0.25 mg (06/13/2019), 0.25 mg (07/05/2019), 0.25 mg (07/26/2019), 0.25 mg (08/16/2019) pegfilgrastim (NEULASTA) injection 6 mg, 6 mg, Subcutaneous, Once, 2 of 2 cycles Administration: 6 mg (04/11/2019), 6 mg (05/04/2019) pegfilgrastim (NEULASTA ONPRO KIT) injection 6 mg, 6 mg, Subcutaneous, Once, 3 of 3 cycles pegfilgrastim-cbqv (UDENYCA) injection 6 mg, 6 mg, Subcutaneous, Once, 5 of 5 cycles Administration: 6 mg (05/25/2019), 6 mg (06/15/2019), 6 mg (07/07/2019), 6 mg (07/28/2019), 6 mg (08/18/2019) CARBOplatin (PARAPLATIN) 900 mg in sodium chloride 0.9 % 500 mL chemo infusion, 900 mg (100 % of original dose 900 mg), Intravenous,  Once, 7 of 7 cycles Dose modification:   (original dose 900 mg, Cycle 1),   (original dose 900 mg, Cycle 2),   (original dose 900 mg, Cycle 3),   (original dose 900 mg, Cycle 4) Administration: 900 mg (04/08/2019), 900 mg (05/02/2019), 900 mg (05/23/2019), 900 mg (06/13/2019), 900 mg (07/05/2019), 900 mg (07/26/2019), 900 mg (08/16/2019) PACLitaxel (TAXOL) 492 mg in sodium chloride 0.9 % 500 mL chemo infusion (> '80mg'$ /m2), 200 mg/m2 = 492 mg, Intravenous,  Once, 7 of 7 cycles Dose modification: 175 mg/m2 (original dose 200 mg/m2, Cycle 6, Reason: Other (see comments), Comment: neuropathy)  Administration: 492 mg (04/08/2019), 492 mg (05/02/2019), 492 mg (05/23/2019), 492 mg (06/13/2019), 492 mg (07/05/2019), 432 mg (07/26/2019), 432 mg (08/16/2019) pembrolizumab (KEYTRUDA) 200 mg in sodium chloride 0.9 % 50 mL chemo infusion, 200 mg, Intravenous, Once, 9 of 12 cycles Administration: 200 mg (04/08/2019), 200 mg (07/05/2019), 200 mg (05/02/2019), 200 mg (05/23/2019), 200 mg (06/13/2019), 200 mg (07/26/2019), 200 mg (08/16/2019), 200 mg (09/06/2019), 200 mg (09/27/2019) fosaprepitant (EMEND) 150 mg, dexamethasone (DECADRON) 12 mg in sodium chloride 0.9 % 145 mL IVPB, , Intravenous,  Once, 7 of 7 cycles Administration:  (04/08/2019),  (05/02/2019),  (05/23/2019),  (06/13/2019),  (07/05/2019),  (07/26/2019),  (08/16/2019)  for chemotherapy treatment.        INTERVAL HISTORY:  Tara Aguilar 38 y.o. female in for follow-up of metastatic squamous cell carcinoma and toxicity assessment prior to immunotherapy with Keytruda. She reported worsening numbness in the extremities.  She is currently taking gabapentin 300 mg twice daily.  She occasionally takes oxycodone at bedtime to sleep better.  She rarely requires oxycodone for right shoulder pain at this time.  She takes Aleve and only needs oxycodone if Aleve does not help.  Denies any fevers or night sweats.  Denies any diarrhea.  No skin rashes.  Denies any change in her chronic cough.  She does have shortness of breath on exertion.  Appetite is 100%.  Energy levels are 50%.  REVIEW OF SYSTEMS:  Review of Systems  Respiratory: Positive for cough and shortness of breath.   Neurological: Positive for numbness.  All other systems reviewed and are negative.    PAST MEDICAL/SURGICAL HISTORY:  Past Medical History:  Diagnosis Date  . Depression   . Glucose intolerance 03/11/2019  . Obesity, Class III, BMI 40-49.9 (morbid obesity) ([place]) 03/11/2019   Past Surgical History:  Procedure Laterality Date  . CESAREAN SECTION    . IRRIGATION AND DEBRIDEMENT ABSCESS   03/15/2019   RETROPHARYNGEAL   . TONSILLECTOMY AND ADENOIDECTOMY N/A 03/15/2019   Procedure: IRRIGATION AND DEBRIDEMENT OF RETROPHARYNGEAL ABSCESS;  Surgeon: Leta Baptist, MD;  Location: [facility];  Service: ENT;  Laterality: N/A;     SOCIAL HISTORY:  Social History   Socioeconomic History  . Marital status: Widowed    Spouse name: Not on file  . Number of children: 1  . Years of education: Not on file  . Highest education level: Not on file  Occupational History  . Not on file  Social Needs  . Financial resource strain: Not very hard  . Food insecurity    Worry: Never true    Inability: Never true  . Transportation needs    Medical: No    Non-medical: No  Tobacco Use  . Smoking status: Current Every Day Smoker    Packs/day: 1.00    Years: 25.00    Pack years: 25.00    Types: Cigarettes  . Smokeless tobacco: Never Used  Substance and Sexual Activity  . Alcohol use: Not Currently    Comment: social   . Drug use: Not Currently  . Sexual activity: Not on file  Lifestyle  . Physical activity    Days per week: 0 days    Minutes per session: 0 min  . Stress: Only a little  Relationships  . Social connections    Talks on phone: More than three times a week    Gets together: Twice a week    Attends religious service: Never    Active member of club or organization: No    Attends meetings of clubs or organizations: Never    Relationship status: Widowed  . Intimate partner violence    Fear of current or ex partner: No    Emotionally abused: No    Physically abused: No    Forced sexual activity: No  Other Topics Concern  . Not on file  Social History Narrative   04/06/19   Husband passed away four months ago from Cancer of the [place].   Pt. Has 21 yo son at home.    FAMILY HISTORY:  Family History  Problem Relation Age of Onset  . Lung cancer Mother   . Hypertension Mother   . Leukemia Father   . Myelodysplastic syndrome Father     CURRENT MEDICATIONS:   Outpatient Encounter Medications as of 09/27/2019  Medication Sig  . apixaban (ELIQUIS) 5 MG TABS tablet Take 1 tablet (5 mg total) by mouth 2 (two) times daily.  [name] CARBOPLATIN IV Inject into the vein every 21 ( twenty-one) days. X 4 cycles  . furosemide (LASIX) 20 MG tablet Take 1 tablet (20 mg total) by mouth once as needed for up to 1 dose (Take '20mg'$  once daily as needed).  . gabapentin (NEURONTIN) 300 MG capsule Take 1 capsule (300 mg total) by mouth 2 (two) times daily.  [name] oxyCODONE (OXY IR/ROXICODONE) 5 MG immediate release tablet Take 1 tablet (5 mg total) by mouth every 4 (four) hours as needed for severe pain.  [name] PACLitaxel (TAXOL IV) Inject into the vein every 21 ( twenty-one) days. X 4 cycles  . PEMBROLIZUMAB IV Inject into the vein every 21 ( twenty-one) days.  [name]  prochlorperazine (COMPAZINE) 10 MG tablet Take 1 tablet (10 mg total) by mouth every 6 (six) hours as needed (Nausea or vomiting). (Patient not taking: Reported on 09/27/2019)  . [DISCONTINUED] erythromycin ophthalmic ointment See admin instructions.  . [DISCONTINUED] nystatin (MYCOSTATIN/NYSTOP) powder Apply topically as needed.    No facility-administered encounter medications on file as of 09/27/2019.     ALLERGIES:  No Known Allergies   PHYSICAL EXAM:  ECOG Performance status: 1  Vitals:   09/27/19 0924  BP: 115/63  Pulse: (!) 121  Resp: 20  Temp: 97.7 F (36.5 C)  SpO2: 95%   Filed Weights   09/27/19 0924  Weight: 269 lb 12.8 oz (122.4 kg)    Physical Exam Constitutional:      Appearance: She is obese.     Comments: Facial edema   HENT:     Mouth/Throat:     Mouth: Mucous membranes are moist.     Pharynx: Oropharynx is clear.  Eyes:     Extraocular Movements: Extraocular movements intact.     Conjunctiva/sclera: Conjunctivae normal.  Neck:     Musculoskeletal: Normal range of motion.  Cardiovascular:     Rate and Rhythm: Normal rate and regular rhythm.     Heart sounds: Normal heart sounds.   Pulmonary:     Effort: Pulmonary effort is normal.     Breath sounds: Normal breath sounds.  Abdominal:     General: Bowel sounds are normal. There is no distension.     Palpations: There is no mass.  Musculoskeletal: Normal range of motion.  Skin:    General: Skin is warm.  Neurological:     General: No focal deficit present.     Mental Status: She is alert and oriented to person, place, and time.  Psychiatric:        Mood and Affect: Mood normal.        Behavior: Behavior normal.        Thought Content: Thought content normal.        Judgment: Judgment normal.      LABORATORY DATA:  I have reviewed the labs as listed.  CBC    Component Value Date/Time   WBC 5.3 09/27/2019 0912   RBC 3.60 (L) 09/27/2019 0912   HGB 11.9 (L) 09/27/2019 0912   HCT 37.0 09/27/2019 0912   PLT 155 09/27/2019 0912   MCV 102.8 (H) 09/27/2019 0912   MCH 33.1 09/27/2019 0912   MCHC 32.2 09/27/2019 0912   RDW 15.3 09/27/2019 0912   LYMPHSABS 0.7 09/27/2019 0912   MONOABS 0.5 09/27/2019 0912   EOSABS 0.1 09/27/2019 0912   BASOSABS 0.0 09/27/2019 0912   CMP Latest Ref Rng & Units 09/27/2019 09/06/2019 08/16/2019  Glucose 70 - 99 mg/dL 110(H) 118(H) 157(H)  BUN 6 - 20 mg/dL '13 13 14  '$ Creatinine 0.44 - 1.00 mg/dL 0.95 0.96 1.02(H)  Sodium 135 - 145 mmol/L 138 139 141  Potassium 3.5 - 5.1 mmol/L 3.6 3.9 3.9  Chloride 98 - 111 mmol/L 105 106 108  CO2 22 - 32 mmol/L '24 24 24  '$ Calcium 8.9 - 10.3 mg/dL 9.1 9.3 8.8(L)  Total Protein 6.5 - 8.1 g/dL 7.0 7.1 6.8  Total Bilirubin 0.3 - 1.2 mg/dL 0.6 0.6 0.4  Alkaline Phos 38 - 126 U/L 63 79 70  AST 15 - 41 U/L '20 19 19  '$ ALT 0 - 44 U/L '27 27 25      '$ ASSESSMENT & PLAN:   Squamous cell lung cancer, unspecified  laterality ([place]) 1.  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 -Palliative XRT to the chest completed on 03/31/2019. -PET scan on 04/04/2019 showed marked hypermetabolic nodal disease in the right supraclavicular region, right thoracic inlet, high right paratracheal station.  Lymphadenopathy in the right neck, both axilla show low-level FDG metabolism.  Markedly hypermetabolic bilateral adrenal nodules. -7 cycles of carboplatin, paclitaxel and pembrolizumab from 04/08/2019 through 08/16/2019. -CT CAP on 08/11/2019 shows continuous improvement with decrease in size of the metastatic disease.  Right supraclavicular soft tissue measures 3.1 x 1.8, previously 3.4 x 2.7 cm.  Soft tissue at the level of mid right paratracheal chain measures 2.4 x 2.0 cm.  Adrenal glands are unremarkable.  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  SVC remains patent via left brachiocephalic vein. -We started maintenance Keytruda on 09/06/2019.  She tolerated it very well without any major chemotherapy related side effects or tiredness. -I have reviewed her labs.  She will proceed with her next treatment today. -She wants to have her PICC line taken out as she is not able to do all her activities at home.  We will try her peripheral line today.  If we are able to get access easily, will discontinue PICC line.  2.  Left basilic vein thrombus: -Ultrasound on 05/03/2019 was positive for nonocclusive thrombus around the PICC line in the left basilic vein, considered superficial thrombus.  No evidence of DVT. -Because of her active malignancy, full dose anticoagulation with Eliquis was recommended.  3.  Right shoulder pain: -This has improved since treatment.  She occasionally gets pain and takes oxycodone and Aleve. -She is also taking oxycodone at bedtime for adequate sleep.  4.  Peripheral neuropathy: -She has constant numbness in the fingertips and toes.  Occasionally the burn. -I will increase gabapentin to 300 mg 3 times a day.   Total time spent  is 25 minutes with more than 50% of the time spent face-to-face discussing treatment plan, counseling and coordination of care.    Derek Jack, MD [place] [phone]

## 2019-09-30 ENCOUNTER — Encounter (HOSPITAL_COMMUNITY): Payer: Self-pay | Admitting: Nurse Practitioner

## 2019-10-03 ENCOUNTER — Encounter (HOSPITAL_COMMUNITY): Payer: Self-pay | Admitting: *Deleted

## 2019-10-03 ENCOUNTER — Other Ambulatory Visit (HOSPITAL_COMMUNITY): Payer: Self-pay | Admitting: *Deleted

## 2019-10-03 DIAGNOSIS — G629 Polyneuropathy, unspecified: Secondary | ICD-10-CM

## 2019-10-03 MED ORDER — GABAPENTIN 300 MG PO CAPS: 300.0000 mg | ORAL_CAPSULE | Freq: Three times a day (TID) | ORAL | 2 refills | Status: DC

## 2019-10-12 ENCOUNTER — Other Ambulatory Visit (HOSPITAL_COMMUNITY): Payer: Self-pay | Admitting: Nurse Practitioner

## 2019-10-12 DIAGNOSIS — C3490 Malignant neoplasm of unspecified part of unspecified bronchus or lung: Secondary | ICD-10-CM

## 2019-10-12 DIAGNOSIS — C779 Secondary and unspecified malignant neoplasm of lymph node, unspecified: Secondary | ICD-10-CM

## 2019-10-12 MED ORDER — OXYCODONE HCL 5 MG PO TABS: 5.0000 mg | ORAL_TABLET | ORAL | 0 refills | Status: DC | PRN

## 2019-10-17 ENCOUNTER — Other Ambulatory Visit: Payer: Self-pay

## 2019-10-18 ENCOUNTER — Ambulatory Visit (HOSPITAL_COMMUNITY): Payer: BC Managed Care – PPO

## 2019-10-18 ENCOUNTER — Ambulatory Visit (HOSPITAL_COMMUNITY): Payer: BC Managed Care – PPO | Admitting: Hematology

## 2019-10-18 ENCOUNTER — Inpatient Hospital Stay (HOSPITAL_COMMUNITY): Payer: BC Managed Care – PPO

## 2019-10-18 ENCOUNTER — Other Ambulatory Visit (HOSPITAL_COMMUNITY): Payer: BC Managed Care – PPO

## 2019-10-19 ENCOUNTER — Inpatient Hospital Stay (HOSPITAL_COMMUNITY): Payer: BC Managed Care – PPO

## 2019-10-19 ENCOUNTER — Other Ambulatory Visit: Payer: Self-pay

## 2019-10-19 ENCOUNTER — Encounter (HOSPITAL_COMMUNITY): Payer: Self-pay | Admitting: Hematology

## 2019-10-19 ENCOUNTER — Inpatient Hospital Stay (HOSPITAL_BASED_OUTPATIENT_CLINIC_OR_DEPARTMENT_OTHER): Payer: BC Managed Care – PPO | Admitting: Hematology

## 2019-10-19 VITALS — BP 122/52 | HR 102 | Temp 96.8°F | Resp 18

## 2019-10-19 VITALS — BP 102/70 | HR 120 | Temp 97.9°F | Resp 20 | Wt 273.9 lb

## 2019-10-19 DIAGNOSIS — G629 Polyneuropathy, unspecified: Secondary | ICD-10-CM | POA: Diagnosis not present

## 2019-10-19 DIAGNOSIS — C3490 Malignant neoplasm of unspecified part of unspecified bronchus or lung: Secondary | ICD-10-CM | POA: Diagnosis not present

## 2019-10-19 DIAGNOSIS — C779 Secondary and unspecified malignant neoplasm of lymph node, unspecified: Secondary | ICD-10-CM

## 2019-10-19 DIAGNOSIS — Z5112 Encounter for antineoplastic immunotherapy: Secondary | ICD-10-CM | POA: Diagnosis not present

## 2019-10-19 LAB — COMPREHENSIVE METABOLIC PANEL
ALT: 28 U/L (ref 0–44)
AST: 24 U/L (ref 15–41)
Albumin: 3.7 g/dL (ref 3.5–5.0)
Alkaline Phosphatase: 68 U/L (ref 38–126)
Anion gap: 11 (ref 5–15)
BUN: 16 mg/dL (ref 6–20)
CO2: 28 mmol/L (ref 22–32)
Calcium: 9.6 mg/dL (ref 8.9–10.3)
Chloride: 100 mmol/L (ref 98–111)
Creatinine, Ser: 1.13 mg/dL — ABNORMAL HIGH (ref 0.44–1.00)
GFR calc Af Amer: >60 mL/min (ref >60)
GFR calc non Af Amer: >60 mL/min (ref >60)
Glucose, Bld: 112 mg/dL — ABNORMAL HIGH (ref 70–99)
Potassium: 4.3 mmol/L (ref 3.5–5.1)
Sodium: 139 mmol/L (ref 135–145)
Total Bilirubin: 0.3 mg/dL (ref 0.3–1.2)
Total Protein: 7.5 g/dL (ref 6.5–8.1)

## 2019-10-19 LAB — CBC WITH DIFFERENTIAL/PLATELET
Abs Immature Granulocytes: 0.03 10*3/uL (ref 0.00–0.07)
Basophils Absolute: 0 10*3/uL (ref 0.0–0.1)
Basophils Relative: 1 %
Eosinophils Absolute: 0.1 10*3/uL (ref 0.0–0.5)
Eosinophils Relative: 1 %
HCT: 40.5 % (ref 36.0–46.0)
Hemoglobin: 12.9 g/dL (ref 12.0–15.0)
Immature Granulocytes: 1 %
Lymphocytes Relative: 17 %
Lymphs Abs: 1 10*3/uL (ref 0.7–4.0)
MCH: 32.4 pg (ref 26.0–34.0)
MCHC: 31.9 g/dL (ref 30.0–36.0)
MCV: 101.8 fL — ABNORMAL HIGH (ref 80.0–100.0)
Monocytes Absolute: 0.5 10*3/uL (ref 0.1–1.0)
Monocytes Relative: 9 %
Neutro Abs: 4.4 10*3/uL (ref 1.7–7.7)
Neutrophils Relative %: 71 %
Platelets: 208 10*3/uL (ref 150–400)
RBC: 3.98 MIL/uL (ref 3.87–5.11)
RDW: 13.8 % (ref 11.5–15.5)
WBC: 6.1 10*3/uL (ref 4.0–10.5)
nRBC: 0 % (ref 0.0–0.2)

## 2019-10-19 LAB — TSH: TSH: 6.571 u[IU]/mL — ABNORMAL HIGH (ref 0.350–4.500)

## 2019-10-19 MED ORDER — GABAPENTIN 300 MG PO CAPS: 300.0000 mg | ORAL_CAPSULE | Freq: Three times a day (TID) | ORAL | 2 refills | Status: DC

## 2019-10-19 MED ORDER — SODIUM CHLORIDE 0.9 % IV SOLN
200.0000 mg | Freq: Once | INTRAVENOUS | Status: AC
  Administered 2019-10-19: 200 mg via INTRAVENOUS
  Filled 2019-10-19: qty 8

## 2019-10-19 MED ORDER — SODIUM CHLORIDE 0.9 % IV SOLN: Freq: Once | INTRAVENOUS | Status: AC

## 2019-10-19 NOTE — Progress Notes (Signed)
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  BRIEF ONCOLOGIC HISTORY:  Oncology History  Squamous cell lung cancer, unspecified laterality ([place])  04/05/2019 Initial Diagnosis   Squamous cell lung cancer, unspecified laterality ([place])   04/08/2019 -  Chemotherapy   The patient had palonosetron (ALOXI) injection 0.25 mg, 0.25 mg, Intravenous,  Once, 7 of 7 cycles Administration: 0.25 mg (04/08/2019), 0.25 mg (05/02/2019), 0.25 mg (05/23/2019), 0.25 mg (06/13/2019), 0.25 mg (07/05/2019), 0.25 mg (07/26/2019), 0.25 mg (08/16/2019) pegfilgrastim (NEULASTA) injection 6 mg, 6 mg, Subcutaneous, Once, 2 of 2 cycles Administration: 6 mg (04/11/2019), 6 mg (05/04/2019) pegfilgrastim (NEULASTA ONPRO KIT) injection 6 mg, 6 mg, Subcutaneous, Once, 3 of 3 cycles pegfilgrastim-cbqv (UDENYCA) injection 6 mg, 6 mg, Subcutaneous, Once, 5 of 5 cycles Administration: 6 mg (05/25/2019), 6 mg (06/15/2019), 6 mg (07/07/2019), 6 mg (07/28/2019), 6 mg (08/18/2019) CARBOplatin (PARAPLATIN) 900 mg in sodium chloride 0.9 % 500 mL chemo infusion, 900 mg (100 % of original dose 900 mg), Intravenous,  Once, 7 of 7 cycles Dose modification:   (original dose 900 mg, Cycle 1),   (original dose 900 mg, Cycle 2),   (original dose 900 mg, Cycle 3),   (original dose 900 mg, Cycle 4) Administration: 900 mg (04/08/2019), 900 mg (05/02/2019), 900 mg (05/23/2019), 900 mg (06/13/2019), 900 mg (07/05/2019), 900 mg (07/26/2019), 900 mg (08/16/2019) PACLitaxel (TAXOL) 492 mg in sodium chloride 0.9 % 500 mL chemo infusion (> 30m/m2), 200 mg/m2 = 492 mg, Intravenous,  Once, 7 of 7 cycles Dose modification: 175 mg/m2 (original dose 200 mg/m2, Cycle 6, Reason: Other (see comments), Comment:  neuropathy) Administration: 492 mg (04/08/2019), 492 mg (05/02/2019), 492 mg (05/23/2019), 492 mg (06/13/2019), 492 mg (07/05/2019), 432 mg (07/26/2019), 432 mg (08/16/2019) pembrolizumab (KEYTRUDA) 200 mg in sodium chloride 0.9 % 50 mL chemo infusion, 200 mg, Intravenous, Once, 10 of 12 cycles Administration: 200 mg (04/08/2019), 200 mg (07/05/2019), 200 mg (05/02/2019), 200 mg (05/23/2019), 200 mg (06/13/2019), 200 mg (07/26/2019), 200 mg (08/16/2019), 200 mg (09/06/2019), 200 mg (09/27/2019), 200 mg (10/19/2019) fosaprepitant (EMEND) 150 mg, dexamethasone (DECADRON) 12 mg in sodium chloride 0.9 % 145 mL IVPB, , Intravenous,  Once, 7 of 7 cycles Administration:  (04/08/2019),  (05/02/2019),  (05/23/2019),  (06/13/2019),  (07/05/2019),  (07/26/2019),  (08/16/2019)  for chemotherapy treatment.        INTERVAL HISTORY:  Ms. [id number]. female seen for follow-up of metastatic squamous cell carcinoma and toxicity assessment prior to immunotherapy with Keytruda.  Last dose was about 3 weeks ago.  Denied any diarrhea.  Denies any new onset skin rashes.  No change in baseline cough noted.  Shortness of breath on exertion is stable.  Neck swelling is also stable.  Energy levels are 50%.  Appetite is 100%.  Denies any fevers, night sweats or weight loss.  Denies any chills or rigors.  She continues to have some neuropathic pain in the feet at bedtime.  During the daytime it is well controlled.  She is currently taking gabapentin 3 times a day.  No headaches or vision changes reported.  REVIEW OF SYSTEMS:  Review of Systems  Respiratory: Positive for cough and shortness of breath.   Neurological: Positive for numbness.  All other systems reviewed and  are negative.    PAST MEDICAL/SURGICAL HISTORY:  Past Medical History:  Diagnosis Date  . Depression   . Glucose intolerance 03/11/2019  . Obesity, Class III, BMI 40-49.9 (morbid obesity) ([place]) 03/11/2019   Past Surgical History:  Procedure Laterality Date  . CESAREAN  SECTION    . IRRIGATION AND DEBRIDEMENT ABSCESS  03/15/2019   RETROPHARYNGEAL   . TONSILLECTOMY AND ADENOIDECTOMY N/A 03/15/2019   Procedure: IRRIGATION AND DEBRIDEMENT OF RETROPHARYNGEAL ABSCESS;  Surgeon: Leta Baptist, MD;  Location: [facility];  Service: ENT;  Laterality: N/A;     SOCIAL HISTORY:  Social History   Socioeconomic History  . Marital status: Widowed    Spouse name: Not on file  . Number of children: 1  . Years of education: Not on file  . Highest education level: Not on file  Occupational History  . Not on file  Tobacco Use  . Smoking status: Current Every Day Smoker    Packs/day: 1.00    Years: 25.00    Pack years: 25.00    Types: Cigarettes  . Smokeless tobacco: Never Used  Substance and Sexual Activity  . Alcohol use: Not Currently    Comment: social   . Drug use: Not Currently  . Sexual activity: Not on file  Other Topics Concern  . Not on file  Social History Narrative   23-Mar-2019   Husband passed away four months ago from Cancer of the [place].   Pt. Has 14 yo son at home.   Social Determinants of Health   Financial Resource Strain: Low Risk   . Difficulty of Paying Living Expenses: Not very hard  Food Insecurity: No Food Insecurity  . Worried About Charity fundraiser in the Last Year: Never true  . Ran Out of Food in the Last Year: Never true  Transportation Needs: No Transportation Needs  . Lack of Transportation (Medical): No  . Lack of Transportation (Non-Medical): No  Physical Activity: Inactive  . Days of Exercise per Week: 0 days  . Minutes of Exercise per Session: 0 min  Stress: No Stress Concern Present  . Feeling of Stress : Only a little  Social Connections: Moderately Isolated  . Frequency of Communication with Friends and Family: More than three times a week  . Frequency of Social Gatherings with Friends and Family: Twice a week  . Attends Religious Services: Never  . Active Member of Clubs or Organizations: No  . Attends Theatre manager Meetings: Never  . Marital Status: Widowed  Intimate Partner Violence: Not At Risk  . Fear of Current or Ex-Partner: No  . Emotionally Abused: No  . Physically Abused: No  . Sexually Abused: No    FAMILY HISTORY:  Family History  Problem Relation Age of Onset  . Lung cancer Mother   . Hypertension Mother   . Leukemia Father   . Myelodysplastic syndrome Father     CURRENT MEDICATIONS:  Outpatient Encounter Medications as of 10/19/2019  Medication Sig  . apixaban (ELIQUIS) 5 MG TABS tablet Take 1 tablet (5 mg total) by mouth 2 (two) times daily.  [name] CARBOPLATIN IV Inject into the vein every 21 ( twenty-one) days. X 4 cycles  . gabapentin (NEURONTIN) 300 MG capsule Take 1 capsule (300 mg total) by mouth 3 (three) times daily. Take 300 mg in the morning, 300 mg at noon and 600 mg at bedtime.  [name] PACLitaxel (TAXOL IV) Inject into the vein every 21 ( twenty-one) days. X 4 cycles  .  PEMBROLIZUMAB IV Inject into the vein every 21 ( twenty-one) days.  . [DISCONTINUED] gabapentin (NEURONTIN) 300 MG capsule Take 1 capsule (300 mg total) by mouth 3 (three) times daily.  . furosemide (LASIX) 20 MG tablet Take 1 tablet (20 mg total) by mouth once as needed for up to 1 dose (Take 65m once daily as needed). (Patient not taking: Reported on 10/19/2019)  . oxyCODONE (OXY IR/ROXICODONE) 5 MG immediate release tablet Take 1 tablet (5 mg total) by mouth every 4 (four) hours as needed for severe pain. (Patient not taking: Reported on 10/19/2019)  . prochlorperazine (COMPAZINE) 10 MG tablet Take 1 tablet (10 mg total) by mouth every 6 (six) hours as needed (Nausea or vomiting). (Patient not taking: Reported on 09/27/2019)   No facility-administered encounter medications on file as of 10/19/2019.    ALLERGIES:  No Known Allergies   PHYSICAL EXAM:  ECOG Performance status: 1  Vitals:   10/19/19 0908  BP: 102/70  Pulse: (!) 120  Resp: 20  Temp: 97.9 F (36.6 C)  SpO2: 96%   Filed  Weights   10/19/19 0908  Weight: 273 lb 14.4 oz (124.2 kg)    Physical Exam Constitutional:      Appearance: She is obese.     Comments: Facial edema   HENT:     Mouth/Throat:     Mouth: Mucous membranes are moist.     Pharynx: Oropharynx is clear.  Eyes:     Extraocular Movements: Extraocular movements intact.     Conjunctiva/sclera: Conjunctivae normal.  Cardiovascular:     Rate and Rhythm: Normal rate and regular rhythm.     Heart sounds: Normal heart sounds.  Pulmonary:     Effort: Pulmonary effort is normal.     Breath sounds: Normal breath sounds.  Abdominal:     General: Bowel sounds are normal. There is no distension.     Palpations: There is no mass.  Musculoskeletal:        General: Normal range of motion.     Cervical back: Normal range of motion.  Skin:    General: Skin is warm.  Neurological:     General: No focal deficit present.     Mental Status: She is alert and oriented to person, place, and time.  Psychiatric:        Mood and Affect: Mood normal.        Behavior: Behavior normal.        Thought Content: Thought content normal.        Judgment: Judgment normal.      LABORATORY DATA:  I have reviewed the labs as listed.  CBC    Component Value Date/Time   WBC 6.1 10/19/2019 0843   RBC 3.98 10/19/2019 0843   HGB 12.9 10/19/2019 0843   HCT 40.5 10/19/2019 0843   PLT 208 10/19/2019 0843   MCV 101.8 (H) 10/19/2019 0843   MCH 32.4 10/19/2019 0843   MCHC 31.9 10/19/2019 0843   RDW 13.8 10/19/2019 0843   LYMPHSABS 1.0 10/19/2019 0843   MONOABS 0.5 10/19/2019 0843   EOSABS 0.1 10/19/2019 0843   BASOSABS 0.0 10/19/2019 0843   CMP Latest Ref Rng & Units 10/19/2019 09/27/2019 09/06/2019  Glucose 70 - 99 mg/dL 112(H) 110(H) 118(H)  BUN 6 - 20 mg/dL _0 Creatinine 0.44 - 1.00 mg/dL 1.13(H) 0.95 0.96  Sodium 135 - 145 mmol/L 139 138 139  Potassium 3.5 - 5.1 mmol/L 4.3 3.6 3.9  Chloride 98 - 111  mmol/L 100 105 106  CO2 22 - 32 mmol/L _0 Calcium 8.9 - 10.3 mg/dL 9.6 9.1 9.3  Total Protein 6.5 - 8.1 g/dL 7.5 7.0 7.1  Total Bilirubin 0.3 - 1.2 mg/dL 0.3 0.6 0.6  Alkaline Phos 38 - 126 U/L 68 63 79  AST 15 - 41 U/L _1 ALT 0 - 44 U/L _2 ASSESSMENT & PLAN:   Squamous cell lung cancer, unspecified laterality (HCC) 1.  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 -Palliative XRT to the chest completed on 03/31/2019. -PET scan on 04/04/2019 showed marked hypermetabolic nodal disease in the right supraclavicular region, right thoracic inlet, high right paratracheal station.  Lymphadenopathy in the right neck, both axilla show low-level FDG metabolism.  Markedly hypermetabolic bilateral adrenal nodules. -7 cycles of carboplatin, paclitaxel and pembrolizumab from 04/08/2019 through 08/16/2019. -CT CAP on 08/11/2019 shows continuous improvement with decrease in size of the metastatic disease.  Right supraclavicular soft tissue measures 3.1 x 1.8, previously 3.4 x 2.7 cm.  Soft tissue at the level of mid right paratracheal chain measures 2.4 x 2.0 cm.  Adrenal glands are unremarkable.  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  SVC remains patent via left brachiocephalic vein. -Maintenance Keytruda started on 09/06/2019. -She denies any diarrhea, skin rashes or new cough.  Baseline cough and shortness of breath have been stable.  Neck swelling is also stable.  She is doing well after the PICC line was discontinued. -I have reviewed her labs.  She will proceed with her next dose of Keytruda today. -I will see her back in 3 weeks for follow-up.  I plan to repeat CT of the chest, abdomen and pelvis prior to next visit.  2.  Left basilic vein thrombus: -Ultrasound on 05/03/2019 was positive for nonocclusive thrombus  around the PICC line in the left basilic vein, considered superficial thrombus.  No evidence of DVT. -She is tolerating Eliquis without any bleeding problems.  3.  Right shoulder pain: -This has also improved since treatment.  She is taking oxycodone at bedtime as needed.  4.  Peripheral neuropathy: -She is taking gabapentin 300 mg 3 times a day. -She still has some numbness and pain at nighttime.  I have told her to increase gabapentin to 600 mg at bedtime.   Total time spent is 25 minutes with more than 50% of the time spent face-to-face discussing treatment plan, counseling and coordination of care.    Derek Jack, MD [place] [phone]

## 2019-10-19 NOTE — Patient Instructions (Signed)
[place] at [facility] Discharge Instructions  You were seen today by Dr. Delton Coombes. He went over your recent lab results. He will see you back in 3 weeks for labs, scans and follow up.   Thank you for choosing [place] at [facility] to provide your oncology and hematology care.  To afford each patient quality time with our provider, please arrive at least 15 minutes before your scheduled appointment time.   If you have a lab appointment with the [place] please come in thru the  Main Entrance and check in at the main information desk  You need to re-schedule your appointment should you arrive 10 or more minutes late.  We strive to give you quality time with our providers, and arriving late affects you and other patients whose appointments are after yours.  Also, if you no show three or more times for appointments you may be dismissed from the clinic at the providers discretion.     Again, thank you for choosing [facility].  Our hope is that these requests will decrease the amount of time that you wait before being seen by our physicians.       _____________________________________________________________  Should you have questions after your visit to [facility], please contact our office at [phone] between the hours of 8:00 a.m. and 4:30 p.m.  Voicemails left after 4:00 p.m. will not be returned until the following business day.  For prescription refill requests, have your pharmacy contact our office and allow 72 hours.    Cancer Center Support Programs:   > Cancer Support Group  2nd Tuesday of the month 1pm-2pm, Journey Room

## 2019-10-19 NOTE — Progress Notes (Signed)
[id number] Labs reviewed with and pt seen by Dr. Delton Coombes and pt approved for Keytruda infusion today per MD                                 Tara Aguilar tolerated Keytruda infusion well without complaints or incident.Peripheral IV site checked by 2 RN's with positive blood return noted prior to and after infusion. VSS upon discharge. Pt discharged self ambulatory in satisfactory condition

## 2019-10-19 NOTE — Patient Instructions (Signed)
[facility] Discharge Instructions for Patients Receiving Chemotherapy   Beginning January 23rd 2017 lab work for the [facility] will be done in the  Main lab at [facility] on 1st floor. If you have a lab appointment with the [facility] please come in thru the  Main Entrance and check in at the main information desk   Today you received the following chemotherapy agents Keytruda. Follow-up as scheduled. Call clinic for any questions or concerns  To help prevent nausea and vomiting after your treatment, we encourage you to take your nausea medication   If you develop nausea and vomiting, or diarrhea that is not controlled by your medication, call the clinic.  The clinic phone number is [phone]. Office hours are Monday-Friday 8:30am-5:00pm.  BELOW ARE SYMPTOMS THAT SHOULD BE REPORTED IMMEDIATELY:  *FEVER GREATER THAN 101.0 F  *CHILLS WITH OR WITHOUT FEVER  NAUSEA AND VOMITING THAT IS NOT CONTROLLED WITH YOUR NAUSEA MEDICATION  *UNUSUAL SHORTNESS OF BREATH  *UNUSUAL BRUISING OR BLEEDING  TENDERNESS IN MOUTH AND THROAT WITH OR WITHOUT PRESENCE OF ULCERS  *URINARY PROBLEMS  *BOWEL PROBLEMS  UNUSUAL RASH Items with * indicate a potential emergency and should be followed up as soon as possible. If you have an emergency after office hours please contact your primary care physician or go to the nearest emergency department.  Please call the clinic during office hours if you have any questions or concerns.   You may also contact the Patient Navigator at [phone] should you have any questions or need assistance in obtaining follow up care.      Resources For Cancer Patients and their Caregivers ? American Cancer Society: Can assist with transportation, wigs, general needs, runs Look Good Feel Better.        808-377-5721 ? Cancer Care: Provides financial assistance, online support groups, medication/co-pay assistance.  1-800-813-HOPE  707-353-3445) ? [place] Assists [place] cancer patients and their families through emotional , educational and financial support.  7188281466 ? [place] DSS Where to apply for food stamps, Medicaid and utility assistance. 606-857-5946 ? [facility]: Transportation to medical appointments. 7132231571 ? Social Security Administration: May apply for disability if have a Stage IV cancer. ([phone] 724-632-7092 ? [employer], Disability and Transit Services: Assists with nutrition, care and transit needs. [phone]

## 2019-10-19 NOTE — Assessment & Plan Note (Signed)
1.  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 -Palliative XRT to the chest completed on 03/31/2019. -PET scan on 04/04/2019 showed marked hypermetabolic nodal disease in the right supraclavicular region, right thoracic inlet, high right paratracheal station.  Lymphadenopathy in the right neck, both axilla show low-level FDG metabolism.  Markedly hypermetabolic bilateral adrenal nodules. -7 cycles of carboplatin, paclitaxel and pembrolizumab from 04/08/2019 through 08/16/2019. -CT CAP on 08/11/2019 shows continuous improvement with decrease in size of the metastatic disease.  Right supraclavicular soft tissue measures 3.1 x 1.8, previously 3.4 x 2.7 cm.  Soft tissue at the level of mid right paratracheal chain measures 2.4 x 2.0 cm.  Adrenal glands are unremarkable.  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  SVC remains patent via left brachiocephalic vein. -Maintenance Keytruda started on 09/06/2019. -She denies any diarrhea, skin rashes or new cough.  Baseline cough and shortness of breath have been stable.  Neck swelling is also stable.  She is doing well after the PICC line was discontinued. -I have reviewed her labs.  She will proceed with her next dose of Keytruda today. -I will see her back in 3 weeks for follow-up.  I plan to repeat CT of the chest, abdomen and pelvis prior to next visit.  2.  Left basilic vein thrombus: -Ultrasound on 05/03/2019 was positive for nonocclusive thrombus around the PICC line in the left basilic vein, considered superficial thrombus.  No evidence of DVT. -She is tolerating Eliquis without any bleeding problems.  3.  Right shoulder pain: -This has also improved since treatment.  She is taking oxycodone at bedtime as needed.  4.  Peripheral neuropathy: -She  is taking gabapentin 300 mg 3 times a day. -She still has some numbness and pain at nighttime.  I have told her to increase gabapentin to 600 mg at bedtime.

## 2019-11-07 ENCOUNTER — Other Ambulatory Visit: Payer: Self-pay

## 2019-11-07 ENCOUNTER — Ambulatory Visit (HOSPITAL_COMMUNITY)
Admission: RE | Admit: 2019-11-07 | Discharge: 2019-11-07 | Disposition: A | Discharge status: Home / Self Care | Payer: BC Managed Care – PPO | Source: Ambulatory Visit | Attending: Hematology | Admitting: Hematology

## 2019-11-07 DIAGNOSIS — C3490 Malignant neoplasm of unspecified part of unspecified bronchus or lung: Secondary | ICD-10-CM

## 2019-11-07 MED ORDER — IOHEXOL 300 MG/ML  SOLN
100.0000 mL | Freq: Once | INTRAMUSCULAR | Status: AC | PRN
  Administered 2019-11-07: 100 mL via INTRAVENOUS

## 2019-11-09 ENCOUNTER — Inpatient Hospital Stay (HOSPITAL_COMMUNITY): Payer: BC Managed Care – PPO | Admitting: Hematology

## 2019-11-09 ENCOUNTER — Inpatient Hospital Stay (HOSPITAL_COMMUNITY): Payer: BC Managed Care – PPO | Attending: Hematology

## 2019-11-09 ENCOUNTER — Inpatient Hospital Stay (HOSPITAL_COMMUNITY): Payer: BC Managed Care – PPO

## 2019-11-09 ENCOUNTER — Encounter (HOSPITAL_COMMUNITY): Payer: Self-pay | Admitting: Hematology

## 2019-11-09 ENCOUNTER — Other Ambulatory Visit: Payer: Self-pay

## 2019-11-09 ENCOUNTER — Other Ambulatory Visit (HOSPITAL_COMMUNITY): Payer: Self-pay | Admitting: Hematology

## 2019-11-09 VITALS — BP 98/65 | HR 112 | Temp 98.6°F | Resp 18

## 2019-11-09 VITALS — BP 121/70 | HR 120 | Temp 98.7°F | Resp 18 | Wt 273.0 lb

## 2019-11-09 DIAGNOSIS — M25511 Pain in right shoulder: Secondary | ICD-10-CM | POA: Diagnosis not present

## 2019-11-09 DIAGNOSIS — Z79899 Other long term (current) drug therapy: Secondary | ICD-10-CM | POA: Diagnosis not present

## 2019-11-09 DIAGNOSIS — C779 Secondary and unspecified malignant neoplasm of lymph node, unspecified: Secondary | ICD-10-CM

## 2019-11-09 DIAGNOSIS — Z86718 Personal history of other venous thrombosis and embolism: Secondary | ICD-10-CM | POA: Insufficient documentation

## 2019-11-09 DIAGNOSIS — G629 Polyneuropathy, unspecified: Secondary | ICD-10-CM | POA: Insufficient documentation

## 2019-11-09 DIAGNOSIS — C3490 Malignant neoplasm of unspecified part of unspecified bronchus or lung: Secondary | ICD-10-CM | POA: Insufficient documentation

## 2019-11-09 DIAGNOSIS — Z5112 Encounter for antineoplastic immunotherapy: Secondary | ICD-10-CM | POA: Diagnosis present

## 2019-11-09 DIAGNOSIS — Z7901 Long term (current) use of anticoagulants: Secondary | ICD-10-CM | POA: Diagnosis not present

## 2019-11-09 LAB — CBC WITH DIFFERENTIAL/PLATELET
Abs Immature Granulocytes: 0.05 10*3/uL (ref 0.00–0.07)
Basophils Absolute: 0 10*3/uL (ref 0.0–0.1)
Basophils Relative: 0 %
Eosinophils Absolute: 0.1 10*3/uL (ref 0.0–0.5)
Eosinophils Relative: 1 %
HCT: 46.1 % — ABNORMAL HIGH (ref 36.0–46.0)
Hemoglobin: 14.8 g/dL (ref 12.0–15.0)
Immature Granulocytes: 1 %
Lymphocytes Relative: 19 %
Lymphs Abs: 1.3 10*3/uL (ref 0.7–4.0)
MCH: 31.5 pg (ref 26.0–34.0)
MCHC: 32.1 g/dL (ref 30.0–36.0)
MCV: 98.1 fL (ref 80.0–100.0)
Monocytes Absolute: 0.4 10*3/uL (ref 0.1–1.0)
Monocytes Relative: 5 %
Neutro Abs: 5.1 10*3/uL (ref 1.7–7.7)
Neutrophils Relative %: 74 %
Platelets: 228 10*3/uL (ref 150–400)
RBC: 4.7 MIL/uL (ref 3.87–5.11)
RDW: 13.4 % (ref 11.5–15.5)
WBC: 6.9 10*3/uL (ref 4.0–10.5)
nRBC: 0 % (ref 0.0–0.2)

## 2019-11-09 LAB — COMPREHENSIVE METABOLIC PANEL
ALT: 33 U/L (ref 0–44)
AST: 20 U/L (ref 15–41)
Albumin: 3.6 g/dL (ref 3.5–5.0)
Alkaline Phosphatase: 72 U/L (ref 38–126)
Anion gap: 10 (ref 5–15)
BUN: 15 mg/dL (ref 6–20)
CO2: 27 mmol/L (ref 22–32)
Calcium: 9.3 mg/dL (ref 8.9–10.3)
Chloride: 99 mmol/L (ref 98–111)
Creatinine, Ser: 1 mg/dL (ref 0.44–1.00)
GFR calc Af Amer: >60 mL/min (ref >60)
GFR calc non Af Amer: >60 mL/min (ref >60)
Glucose, Bld: 155 mg/dL — ABNORMAL HIGH (ref 70–99)
Potassium: 4.2 mmol/L (ref 3.5–5.1)
Sodium: 136 mmol/L (ref 135–145)
Total Bilirubin: 0.6 mg/dL (ref 0.3–1.2)
Total Protein: 7.7 g/dL (ref 6.5–8.1)

## 2019-11-09 MED ORDER — SODIUM CHLORIDE 0.9 % IV SOLN
200.0000 mg | Freq: Once | INTRAVENOUS | Status: AC
  Administered 2019-11-09: 200 mg via INTRAVENOUS
  Filled 2019-11-09: qty 8

## 2019-11-09 MED ORDER — SODIUM CHLORIDE 0.9 % IV SOLN: Freq: Once | INTRAVENOUS | Status: AC

## 2019-11-09 MED ORDER — APIXABAN 5 MG PO TABS: 5.0000 mg | ORAL_TABLET | Freq: Two times a day (BID) | ORAL | 2 refills | Status: DC

## 2019-11-09 MED ORDER — SODIUM CHLORIDE 0.9% FLUSH: 10.0000 mL | INTRAVENOUS | Status: DC | PRN

## 2019-11-09 MED ORDER — HEPARIN SOD (PORK) LOCK FLUSH 100 UNIT/ML IV SOLN: 500.0000 [IU] | Freq: Once | INTRAVENOUS | Status: DC | PRN

## 2019-11-09 NOTE — Assessment & Plan Note (Signed)
1.  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 -7 cycles of carboplatin, paclitaxel and pembrolizumab from 04/08/2019 through 08/16/2019. -Maintenance Keytruda started on 09/06/2019. -She is tolerating Keytruda reasonably well without any immunotherapy related side effects. -We reviewed CT CAP from 11/07/2019 which showed stable disease. -We reviewed her labs.  She may proceed with Keytruda today.  I plan to see her back in 3 weeks for follow-up.  2.  Peripheral neuropathy: -She was taking gabapentin 300 mg 3 times a day. -I have told her to increase his nighttime dose to 600 mg.  She is taking it as needed as it makes her sleep hard.  3.  Right shoulder pain: -She is using oxycodone at bedtime as needed.  4.  Left basilic vein thrombus: -Ultrasound on 05/03/2019 showed nonocclusive thrombus around the PICC line in the left basilic vein, considered superficial thrombus.  No evidence of DVT. -Because of her metastatic cancer, we have recommended continuing Eliquis.  She is tolerating it very well.  We will give her a refill.

## 2019-11-09 NOTE — Patient Instructions (Addendum)
[place] at [facility] Discharge Instructions  You were seen today by Dr. Delton Coombes. He went over your recent lab results. He will see you back in 3 weeks for labs, treatment and follow up.   Thank you for choosing [place] at [facility] to provide your oncology and hematology care.  To afford each patient quality time with our provider, please arrive at least 15 minutes before your scheduled appointment time.   If you have a lab appointment with the [place] please come in thru the  Main Entrance and check in at the main information desk  You need to re-schedule your appointment should you arrive 10 or more minutes late.  We strive to give you quality time with our providers, and arriving late affects you and other patients whose appointments are after yours.  Also, if you no show three or more times for appointments you may be dismissed from the clinic at the providers discretion.     Again, thank you for choosing [facility].  Our hope is that these requests will decrease the amount of time that you wait before being seen by our physicians.       _____________________________________________________________  Should you have questions after your visit to [facility], please contact our office at [phone] between the hours of 8:00 a.m. and 4:30 p.m.  Voicemails left after 4:00 p.m. will not be returned until the following business day.  For prescription refill requests, have your pharmacy contact our office and allow 72 hours.    Cancer Center Support Programs:   > Cancer Support Group  2nd Tuesday of the month 1pm-2pm, Journey Room

## 2019-11-09 NOTE — Progress Notes (Signed)
Labs reviewed with MD today. Proceed with treatment today.   HR 109-ok to proceed per MD.  Treatment given per orders. Patient tolerated it well without problems. Vitals stable and discharged home from clinic ambulatory. Follow up as scheduled.

## 2019-11-09 NOTE — Progress Notes (Signed)
Patient assessed and labs reviewed by Dr. Delton Coombes. Pt is good for Tx.

## 2019-11-09 NOTE — Progress Notes (Signed)
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  BRIEF ONCOLOGIC HISTORY:  Oncology History  Squamous cell lung cancer, unspecified laterality ([place])  04/05/2019 Initial Diagnosis   Squamous cell lung cancer, unspecified laterality ([place])   04/08/2019 -  Chemotherapy   The patient had palonosetron (ALOXI) injection 0.25 mg, 0.25 mg, Intravenous,  Once, 7 of 7 cycles Administration: 0.25 mg (04/08/2019), 0.25 mg (05/02/2019), 0.25 mg (05/23/2019), 0.25 mg (06/13/2019), 0.25 mg (07/05/2019), 0.25 mg (07/26/2019), 0.25 mg (08/16/2019) pegfilgrastim (NEULASTA) injection 6 mg, 6 mg, Subcutaneous, Once, 2 of 2 cycles Administration: 6 mg (04/11/2019), 6 mg (05/04/2019) pegfilgrastim (NEULASTA ONPRO KIT) injection 6 mg, 6 mg, Subcutaneous, Once, 3 of 3 cycles pegfilgrastim-cbqv (UDENYCA) injection 6 mg, 6 mg, Subcutaneous, Once, 5 of 5 cycles Administration: 6 mg (05/25/2019), 6 mg (06/15/2019), 6 mg (07/07/2019), 6 mg (07/28/2019), 6 mg (08/18/2019) CARBOplatin (PARAPLATIN) 900 mg in sodium chloride 0.9 % 500 mL chemo infusion, 900 mg (100 % of original dose 900 mg), Intravenous,  Once, 7 of 7 cycles Dose modification:   (original dose 900 mg, Cycle 1),   (original dose 900 mg, Cycle 2),   (original dose 900 mg, Cycle 3),   (original dose 900 mg, Cycle 4) Administration: 900 mg (04/08/2019), 900 mg (05/02/2019), 900 mg (05/23/2019), 900 mg (06/13/2019), 900 mg (07/05/2019), 900 mg (07/26/2019), 900 mg (08/16/2019) PACLitaxel (TAXOL) 492 mg in sodium chloride 0.9 % 500 mL chemo infusion (> '80mg'$ /m2), 200 mg/m2 = 492 mg, Intravenous,  Once, 7 of 7 cycles Dose modification: 175 mg/m2 (original dose 200 mg/m2, Cycle 6, Reason: Other (see comments), Comment:  neuropathy) Administration: 492 mg (04/08/2019), 492 mg (05/02/2019), 492 mg (05/23/2019), 492 mg (06/13/2019), 492 mg (07/05/2019), 432 mg (07/26/2019), 432 mg (08/16/2019) pembrolizumab (KEYTRUDA) 200 mg in sodium chloride 0.9 % 50 mL chemo infusion, 200 mg, Intravenous, Once, 11 of 12 cycles Administration: 200 mg (04/08/2019), 200 mg (07/05/2019), 200 mg (05/02/2019), 200 mg (05/23/2019), 200 mg (06/13/2019), 200 mg (07/26/2019), 200 mg (08/16/2019), 200 mg (09/06/2019), 200 mg (09/27/2019), 200 mg (10/19/2019), 200 mg (11/09/2019) fosaprepitant (EMEND) 150 mg, dexamethasone (DECADRON) 12 mg in sodium chloride 0.9 % 145 mL IVPB, , Intravenous,  Once, 7 of 7 cycles Administration:  (04/08/2019),  (05/02/2019),  (05/23/2019),  (06/13/2019),  (07/05/2019),  (07/26/2019),  (08/16/2019)  for chemotherapy treatment.        INTERVAL HISTORY:  Tara Aguilar 39 y.o. female seen for follow-up of metastatic squamous cell carcinoma and toxicity assessment prior to immunotherapy.  She is taking Eliquis without any bleeding issues.  Appetite is 100%.  Energy levels are 50%.  Shortness of breath on exertion is stable.  Neuropathy is well controlled with gabapentin 3 times a day.  She is occasionally taking 600 mg at bedtime which makes her more sleepy.  She is also using oxycodone at bedtime as needed.  Denies any diarrhea, skin rashes or new onset cough.  Chronic cough is stable.  REVIEW OF SYSTEMS:  Review of Systems  Respiratory: Positive for cough and shortness of breath.   Neurological: Positive for numbness.  All other systems reviewed and are negative.    PAST MEDICAL/SURGICAL HISTORY:  Past Medical History:  Diagnosis Date  . Depression   . Glucose intolerance 03/11/2019  .  Obesity, Class III, BMI 40-49.9 (morbid obesity) ([place]) 03/11/2019   Past Surgical History:  Procedure Laterality Date  . CESAREAN SECTION    . IRRIGATION AND DEBRIDEMENT ABSCESS  03/15/2019   RETROPHARYNGEAL   . TONSILLECTOMY AND ADENOIDECTOMY N/A  03/15/2019   Procedure: IRRIGATION AND DEBRIDEMENT OF RETROPHARYNGEAL ABSCESS;  Surgeon: Leta Baptist, MD;  Location: [facility];  Service: ENT;  Laterality: N/A;     SOCIAL HISTORY:  Social History   Socioeconomic History  . Marital status: Widowed    Spouse name: Not on file  . Number of children: 1  . Years of education: Not on file  . Highest education level: Not on file  Occupational History  . Not on file  Tobacco Use  . Smoking status: Current Every Day Smoker    Packs/day: 1.00    Years: 25.00    Pack years: 25.00    Types: Cigarettes  . Smokeless tobacco: Never Used  Substance and Sexual Activity  . Alcohol use: Not Currently    Comment: social   . Drug use: Not Currently  . Sexual activity: Not on file  Other Topics Concern  . Not on file  Social History Narrative   04/03/19   Husband passed away four months ago from Cancer of the [place].   Pt. Has 87 yo son at home.   Social Determinants of Health   Financial Resource Strain: Low Risk   . Difficulty of Paying Living Expenses: Not very hard  Food Insecurity: No Food Insecurity  . Worried About Charity fundraiser in the Last Year: Never true  . Ran Out of Food in the Last Year: Never true  Transportation Needs: No Transportation Needs  . Lack of Transportation (Medical): No  . Lack of Transportation (Non-Medical): No  Physical Activity: Inactive  . Days of Exercise per Week: 0 days  . Minutes of Exercise per Session: 0 min  Stress: No Stress Concern Present  . Feeling of Stress : Only a little  Social Connections: Moderately Isolated  . Frequency of Communication with Friends and Family: More than three times a week  . Frequency of Social Gatherings with Friends and Family: Twice a week  . Attends Religious Services: Never  . Active Member of Clubs or Organizations: No  . Attends Archivist Meetings: Never  . Marital Status: Widowed  Intimate Partner Violence: Not At Risk  . Fear of Current or  Ex-Partner: No  . Emotionally Abused: No  . Physically Abused: No  . Sexually Abused: No    FAMILY HISTORY:  Family History  Problem Relation Age of Onset  . Lung cancer Mother   . Hypertension Mother   . Leukemia Father   . Myelodysplastic syndrome Father     CURRENT MEDICATIONS:  Outpatient Encounter Medications as of 11/09/2019  Medication Sig  . apixaban (ELIQUIS) 5 MG TABS tablet Take 1 tablet (5 mg total) by mouth 2 (two) times daily.  [name] gabapentin (NEURONTIN) 300 MG capsule Take 1 capsule (300 mg total) by mouth 3 (three) times daily. Take 300 mg in the morning, 300 mg at noon and 600 mg at bedtime.  . [DISCONTINUED] apixaban (ELIQUIS) 5 MG TABS tablet Take 1 tablet (5 mg total) by mouth 2 (two) times daily.  [name] CARBOPLATIN IV Inject into the vein every 21 ( twenty-one) days. X 4 cycles  . furosemide (LASIX) 20 MG tablet Take 1 tablet (20 mg total) by mouth once as needed for up to 1 dose (  Take '20mg'$  once daily as needed). (Patient not taking: Reported on 10/19/2019)  . oxyCODONE (OXY IR/ROXICODONE) 5 MG immediate release tablet Take 1 tablet (5 mg total) by mouth every 4 (four) hours as needed for severe pain. (Patient not taking: Reported on 10/19/2019)  . PACLitaxel (TAXOL IV) Inject into the vein every 21 ( twenty-one) days. X 4 cycles  . PEMBROLIZUMAB IV Inject into the vein every 21 ( twenty-one) days.  . prochlorperazine (COMPAZINE) 10 MG tablet Take 1 tablet (10 mg total) by mouth every 6 (six) hours as needed (Nausea or vomiting). (Patient not taking: Reported on 09/27/2019)   No facility-administered encounter medications on file as of 11/09/2019.    ALLERGIES:  No Known Allergies   PHYSICAL EXAM:  ECOG Performance status: 1  Vitals:   11/09/19 1156  BP: 121/70  Pulse: (!) 120  Resp: 18  Temp: 98.7 F (37.1 C)  SpO2: 96%   Filed Weights   11/09/19 1156  Weight: 273 lb (123.8 kg)    Physical Exam Constitutional:      Comments: Facial edema   HENT:      Mouth/Throat:     Mouth: Mucous membranes are moist.     Pharynx: Oropharynx is clear.  Eyes:     Extraocular Movements: Extraocular movements intact.     Conjunctiva/sclera: Conjunctivae normal.  Cardiovascular:     Rate and Rhythm: Normal rate and regular rhythm.     Heart sounds: Normal heart sounds.  Pulmonary:     Effort: Pulmonary effort is normal.     Breath sounds: Normal breath sounds.  Abdominal:     General: Bowel sounds are normal. There is no distension.     Palpations: There is no mass.  Musculoskeletal:        General: Normal range of motion.     Cervical back: Normal range of motion.  Skin:    General: Skin is warm.  Neurological:     General: No focal deficit present.     Mental Status: She is alert and oriented to person, place, and time.  Psychiatric:        Mood and Affect: Mood normal.        Behavior: Behavior normal.        Thought Content: Thought content normal.        Judgment: Judgment normal.      LABORATORY DATA:  I have reviewed the labs as listed.  CBC    Component Value Date/Time   WBC 6.9 11/09/2019 1116   RBC 4.70 11/09/2019 1116   HGB 14.8 11/09/2019 1116   HCT 46.1 (H) 11/09/2019 1116   PLT 228 11/09/2019 1116   MCV 98.1 11/09/2019 1116   MCH 31.5 11/09/2019 1116   MCHC 32.1 11/09/2019 1116   RDW 13.4 11/09/2019 1116   LYMPHSABS 1.3 11/09/2019 1116   MONOABS 0.4 11/09/2019 1116   EOSABS 0.1 11/09/2019 1116   BASOSABS 0.0 11/09/2019 1116   CMP Latest Ref Rng & Units 11/09/2019 10/19/2019 09/27/2019  Glucose 70 - 99 mg/dL 155(H) 112(H) 110(H)  BUN 6 - 20 mg/dL '15 16 13  '$ Creatinine 0.44 - 1.00 mg/dL 1.00 1.13(H) 0.95  Sodium 135 - 145 mmol/L 136 139 138  Potassium 3.5 - 5.1 mmol/L 4.2 4.3 3.6  Chloride 98 - 111 mmol/L 99 100 105  CO2 22 - 32 mmol/L '27 28 24  '$ Calcium 8.9 - 10.3 mg/dL 9.3 9.6 9.1  Total Protein 6.5 - 8.1 g/dL 7.7 7.5 7.0  Total  Bilirubin 0.3 - 1.2 mg/dL 0.6 0.3 0.6  Alkaline Phos 38 - 126 U/L 72 68 63  AST  15 - 41 U/L '20 24 20  '$ ALT 0 - 44 U/L 33 28 27      ASSESSMENT & PLAN:   Squamous cell lung cancer, unspecified laterality ([place]) 1.  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 -7 cycles of carboplatin, paclitaxel and pembrolizumab from 04/08/2019 through 08/16/2019. -Maintenance Keytruda started on 09/06/2019. -She is tolerating Keytruda reasonably well without any immunotherapy related side effects. -We reviewed CT CAP from 11/07/2019 which showed stable disease. -We reviewed her labs.  She may proceed with Keytruda today.  I plan to see her back in 3 weeks for follow-up.  2.  Peripheral neuropathy: -She was taking gabapentin 300 mg 3 times a day. -I have told her to increase his nighttime dose to 600 mg.  She is taking it as needed as it makes her sleep hard.  3.  Right shoulder pain: -She is using oxycodone at bedtime as needed.  4.  Left basilic vein thrombus: -Ultrasound on 05/03/2019 showed nonocclusive thrombus around the PICC line in the left basilic vein, considered superficial thrombus.  No evidence of DVT. -Because of her metastatic cancer, we have recommended continuing Eliquis.  She is tolerating it very well.  We will give her a refill.      Derek Jack, MD [place] [phone]

## 2019-11-09 NOTE — Patient Instructions (Signed)
[facility] Discharge Instructions for Patients Receiving Chemotherapy  Today you received the following chemotherapy agents   To help prevent nausea and vomiting after your treatment, we encourage you to take your nausea medication   If you develop nausea and vomiting that is not controlled by your nausea medication, call the clinic.   BELOW ARE SYMPTOMS THAT SHOULD BE REPORTED IMMEDIATELY:  *FEVER GREATER THAN 100.5 F  *CHILLS WITH OR WITHOUT FEVER  NAUSEA AND VOMITING THAT IS NOT CONTROLLED WITH YOUR NAUSEA MEDICATION  *UNUSUAL SHORTNESS OF BREATH  *UNUSUAL BRUISING OR BLEEDING  TENDERNESS IN MOUTH AND THROAT WITH OR WITHOUT PRESENCE OF ULCERS  *URINARY PROBLEMS  *BOWEL PROBLEMS  UNUSUAL RASH Items with * indicate a potential emergency and should be followed up as soon as possible.  Feel free to call the clinic should you have any questions or concerns. The clinic phone number is [phone].  Please show the CHEMO ALERT CARD at check-in to the Emergency Department and triage nurse.   

## 2019-11-30 ENCOUNTER — Encounter (HOSPITAL_COMMUNITY): Payer: Self-pay | Admitting: Hematology

## 2019-11-30 ENCOUNTER — Other Ambulatory Visit: Payer: Self-pay

## 2019-11-30 ENCOUNTER — Inpatient Hospital Stay (HOSPITAL_COMMUNITY): Payer: BC Managed Care – PPO

## 2019-11-30 ENCOUNTER — Inpatient Hospital Stay (HOSPITAL_COMMUNITY): Payer: BC Managed Care – PPO | Attending: Hematology

## 2019-11-30 ENCOUNTER — Inpatient Hospital Stay (HOSPITAL_COMMUNITY): Payer: BC Managed Care – PPO | Admitting: Hematology

## 2019-11-30 VITALS — BP 112/73 | HR 101 | Temp 98.0°F | Resp 20

## 2019-11-30 DIAGNOSIS — C3490 Malignant neoplasm of unspecified part of unspecified bronchus or lung: Secondary | ICD-10-CM

## 2019-11-30 DIAGNOSIS — R05 Cough: Secondary | ICD-10-CM | POA: Diagnosis not present

## 2019-11-30 DIAGNOSIS — F17210 Nicotine dependence, cigarettes, uncomplicated: Secondary | ICD-10-CM | POA: Diagnosis not present

## 2019-11-30 DIAGNOSIS — Z7901 Long term (current) use of anticoagulants: Secondary | ICD-10-CM | POA: Insufficient documentation

## 2019-11-30 DIAGNOSIS — Z79899 Other long term (current) drug therapy: Secondary | ICD-10-CM | POA: Insufficient documentation

## 2019-11-30 DIAGNOSIS — R635 Abnormal weight gain: Secondary | ICD-10-CM | POA: Insufficient documentation

## 2019-11-30 DIAGNOSIS — Z801 Family history of malignant neoplasm of trachea, bronchus and lung: Secondary | ICD-10-CM | POA: Diagnosis not present

## 2019-11-30 DIAGNOSIS — C7970 Secondary malignant neoplasm of unspecified adrenal gland: Secondary | ICD-10-CM | POA: Diagnosis not present

## 2019-11-30 DIAGNOSIS — M25511 Pain in right shoulder: Secondary | ICD-10-CM | POA: Diagnosis not present

## 2019-11-30 DIAGNOSIS — R0602 Shortness of breath: Secondary | ICD-10-CM | POA: Insufficient documentation

## 2019-11-30 DIAGNOSIS — Z5112 Encounter for antineoplastic immunotherapy: Secondary | ICD-10-CM | POA: Insufficient documentation

## 2019-11-30 DIAGNOSIS — Z9221 Personal history of antineoplastic chemotherapy: Secondary | ICD-10-CM | POA: Diagnosis not present

## 2019-11-30 DIAGNOSIS — Z8249 Family history of ischemic heart disease and other diseases of the circulatory system: Secondary | ICD-10-CM | POA: Insufficient documentation

## 2019-11-30 DIAGNOSIS — R200 Anesthesia of skin: Secondary | ICD-10-CM | POA: Diagnosis not present

## 2019-11-30 LAB — CBC WITH DIFFERENTIAL/PLATELET
Abs Immature Granulocytes: 0.05 10*3/uL (ref 0.00–0.07)
Basophils Absolute: 0 10*3/uL (ref 0.0–0.1)
Basophils Relative: 1 %
Eosinophils Absolute: 0.1 10*3/uL (ref 0.0–0.5)
Eosinophils Relative: 2 %
HCT: 42.5 % (ref 36.0–46.0)
Hemoglobin: 13.5 g/dL (ref 12.0–15.0)
Immature Granulocytes: 1 %
Lymphocytes Relative: 20 %
Lymphs Abs: 1.4 10*3/uL (ref 0.7–4.0)
MCH: 31.3 pg (ref 26.0–34.0)
MCHC: 31.8 g/dL (ref 30.0–36.0)
MCV: 98.4 fL (ref 80.0–100.0)
Monocytes Absolute: 0.5 10*3/uL (ref 0.1–1.0)
Monocytes Relative: 7 %
Neutro Abs: 4.8 10*3/uL (ref 1.7–7.7)
Neutrophils Relative %: 69 %
Platelets: 226 10*3/uL (ref 150–400)
RBC: 4.32 MIL/uL (ref 3.87–5.11)
RDW: 13.5 % (ref 11.5–15.5)
WBC: 6.9 10*3/uL (ref 4.0–10.5)
nRBC: 0 % (ref 0.0–0.2)

## 2019-11-30 LAB — COMPREHENSIVE METABOLIC PANEL
ALT: 34 U/L (ref 0–44)
AST: 26 U/L (ref 15–41)
Albumin: 3.7 g/dL (ref 3.5–5.0)
Alkaline Phosphatase: 73 U/L (ref 38–126)
Anion gap: 8 (ref 5–15)
BUN: 14 mg/dL (ref 6–20)
CO2: 27 mmol/L (ref 22–32)
Calcium: 8.8 mg/dL — ABNORMAL LOW (ref 8.9–10.3)
Chloride: 102 mmol/L (ref 98–111)
Creatinine, Ser: 0.85 mg/dL (ref 0.44–1.00)
GFR calc Af Amer: >60 mL/min (ref >60)
GFR calc non Af Amer: >60 mL/min (ref >60)
Glucose, Bld: 95 mg/dL (ref 70–99)
Potassium: 4 mmol/L (ref 3.5–5.1)
Sodium: 137 mmol/L (ref 135–145)
Total Bilirubin: 0.5 mg/dL (ref 0.3–1.2)
Total Protein: 7.2 g/dL (ref 6.5–8.1)

## 2019-11-30 MED ORDER — SODIUM CHLORIDE 0.9 % IV SOLN: Freq: Once | INTRAVENOUS | Status: AC

## 2019-11-30 MED ORDER — SODIUM CHLORIDE 0.9 % IV SOLN
200.0000 mg | Freq: Once | INTRAVENOUS | Status: AC
  Administered 2019-11-30: 200 mg via INTRAVENOUS
  Filled 2019-11-30: qty 8

## 2019-11-30 NOTE — Patient Instructions (Addendum)
[place] at [facility] Discharge Instructions  You were seen today by Dr. Delton Coombes. He went over your recent lab results. He will see you back in 3 weeks for labs, treatment and follow up.   Thank you for choosing [place] at [facility] to provide your oncology and hematology care.  To afford each patient quality time with our provider, please arrive at least 15 minutes before your scheduled appointment time.   If you have a lab appointment with the [facility] please come in thru the  Main Entrance and check in at the main information desk  You need to re-schedule your appointment should you arrive 10 or more minutes late.  We strive to give you quality time with our providers, and arriving late affects you and other patients whose appointments are after yours.  Also, if you no show three or more times for appointments you may be dismissed from the clinic at the providers discretion.     Again, thank you for choosing [facility].  Our hope is that these requests will decrease the amount of time that you wait before being seen by our physicians.       _____________________________________________________________  Should you have questions after your visit to [facility], please contact our office at [phone] between the hours of 8:00 a.m. and 4:30 p.m.  Voicemails left after 4:00 p.m. will not be returned until the following business day.  For prescription refill requests, have your pharmacy contact our office and allow 72 hours.    Cancer Center Support Programs:   > Cancer Support Group  2nd Tuesday of the month 1pm-2pm, Journey Room

## 2019-11-30 NOTE — Progress Notes (Signed)
Dr. Delton Coombes at the bedside. Plan of care discussed with patient. Assessment performed. Per Verbal Order from Dr. Delton Coombes. Labs pending. If the labs are within parameters proceed with treatment today. HR 110 and MD aware. Recheck 106. Patient's heart rate trends 110.   Treatment given today per MD orders. Tolerated infusion without adverse affects. Vital signs stable. No complaints at this time. Discharged from clinic ambulatory. F/U with [facility] as scheduled.

## 2019-11-30 NOTE — Assessment & Plan Note (Signed)
1.  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 -7 cycles of carboplatin, paclitaxel and pembrolizumab from 04/08/2019 through 08/16/2019. -Maintenance Keytruda started on 09/06/2019. -CT CAP from 11/07/2019 showed stable disease. -She is continuing to tolerate Keytruda without any immunotherapy related side effects. -We reviewed her labs.  She will proceed with Keytruda today.  We will see her back in 3 weeks for follow-up.  2.  Peripheral neuropathy: -She reports that her neuropathy is well controlled on gabapentin 300 mg 3 times a day.  Occasionally she forgets to take a dose or 2.  3.  Right shoulder pain: -She is using oxycodone at bedtime as needed.  4.  Left basilic vein thrombus: -Ultrasound on 05/03/2019 showed nonocclusive thrombus around the PICC line in the left basilic vein, considered superficial thrombus.  No evidence of DVT. -Because of her metastatic cancer, we have recommended continuing Eliquis.  She is tolerating it very well.  We will give her a refill.

## 2019-11-30 NOTE — Patient Instructions (Signed)
[facility] Discharge Instructions for Patients Receiving Chemotherapy  Today you received the following chemotherapy agents   To help prevent nausea and vomiting after your treatment, we encourage you to take your nausea medication   If you develop nausea and vomiting that is not controlled by your nausea medication, call the clinic.   BELOW ARE SYMPTOMS THAT SHOULD BE REPORTED IMMEDIATELY:  *FEVER GREATER THAN 100.5 F  *CHILLS WITH OR WITHOUT FEVER  NAUSEA AND VOMITING THAT IS NOT CONTROLLED WITH YOUR NAUSEA MEDICATION  *UNUSUAL SHORTNESS OF BREATH  *UNUSUAL BRUISING OR BLEEDING  TENDERNESS IN MOUTH AND THROAT WITH OR WITHOUT PRESENCE OF ULCERS  *URINARY PROBLEMS  *BOWEL PROBLEMS  UNUSUAL RASH Items with * indicate a potential emergency and should be followed up as soon as possible.  Feel free to call the clinic should you have any questions or concerns. The clinic phone number is [phone].  Please show the CHEMO ALERT CARD at check-in to the Emergency Department and triage nurse.   

## 2019-11-30 NOTE — Progress Notes (Signed)
Patient has been assessed, vital signs and labs have been reviewed by Dr. Katragadda. ANC, Creatinine, LFTs, and Platelets are within treatment parameters per Dr. Katragadda. The patient is good to proceed with treatment at this time.  

## 2019-11-30 NOTE — Progress Notes (Signed)
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  BRIEF ONCOLOGIC HISTORY:  Oncology History  Squamous cell lung cancer, unspecified laterality ([place])  04/05/2019 Initial Diagnosis   Squamous cell lung cancer, unspecified laterality ([place])   04/08/2019 -  Chemotherapy   The patient had palonosetron (ALOXI) injection 0.25 mg, 0.25 mg, Intravenous,  Once, 7 of 7 cycles Administration: 0.25 mg (04/08/2019), 0.25 mg (05/02/2019), 0.25 mg (05/23/2019), 0.25 mg (06/13/2019), 0.25 mg (07/05/2019), 0.25 mg (07/26/2019), 0.25 mg (08/16/2019) pegfilgrastim (NEULASTA) injection 6 mg, 6 mg, Subcutaneous, Once, 2 of 2 cycles Administration: 6 mg (04/11/2019), 6 mg (05/04/2019) pegfilgrastim (NEULASTA ONPRO KIT) injection 6 mg, 6 mg, Subcutaneous, Once, 3 of 3 cycles pegfilgrastim-cbqv (UDENYCA) injection 6 mg, 6 mg, Subcutaneous, Once, 5 of 5 cycles Administration: 6 mg (05/25/2019), 6 mg (06/15/2019), 6 mg (07/07/2019), 6 mg (07/28/2019), 6 mg (08/18/2019) CARBOplatin (PARAPLATIN) 900 mg in sodium chloride 0.9 % 500 mL chemo infusion, 900 mg (100 % of original dose 900 mg), Intravenous,  Once, 7 of 7 cycles Dose modification:   (original dose 900 mg, Cycle 1),   (original dose 900 mg, Cycle 2),   (original dose 900 mg, Cycle 3),   (original dose 900 mg, Cycle 4) Administration: 900 mg (04/08/2019), 900 mg (05/02/2019), 900 mg (05/23/2019), 900 mg (06/13/2019), 900 mg (07/05/2019), 900 mg (07/26/2019), 900 mg (08/16/2019) PACLitaxel (TAXOL) 492 mg in sodium chloride 0.9 % 500 mL chemo infusion (> '80mg'$ /m2), 200 mg/m2 = 492 mg, Intravenous,  Once, 7 of 7 cycles Dose modification: 175 mg/m2 (original dose 200 mg/m2, Cycle 6, Reason: Other (see comments), Comment: neuropathy)  Administration: 492 mg (04/08/2019), 492 mg (05/02/2019), 492 mg (05/23/2019), 492 mg (06/13/2019), 492 mg (07/05/2019), 432 mg (07/26/2019), 432 mg (08/16/2019) pembrolizumab (KEYTRUDA) 200 mg in sodium chloride 0.9 % 50 mL chemo infusion, 200 mg, Intravenous, Once, 12 of 16 cycles Administration: 200 mg (04/08/2019), 200 mg (07/05/2019), 200 mg (05/02/2019), 200 mg (05/23/2019), 200 mg (06/13/2019), 200 mg (07/26/2019), 200 mg (08/16/2019), 200 mg (09/06/2019), 200 mg (09/27/2019), 200 mg (10/19/2019), 200 mg (11/09/2019) fosaprepitant (EMEND) 150 mg, dexamethasone (DECADRON) 12 mg in sodium chloride 0.9 % 145 mL IVPB, , Intravenous,  Once, 7 of 7 cycles Administration:  (04/08/2019),  (05/02/2019),  (05/23/2019),  (06/13/2019),  (07/05/2019),  (07/26/2019),  (08/16/2019)  for chemotherapy treatment.        INTERVAL HISTORY:  Tara Aguilar 39 y.o. female seen for follow-up of metastatic squamous cell carcinoma of the lung and toxicity assessment prior to her immunotherapy.  She is tolerating Keytruda very well.  Appetite is 100%.  Energy levels are 50%.  Numbness in the hands and feet has been stable.  Reports some cough and shortness of breath which have been stable.  Denies any diarrhea, skin rash or change in her baseline cough.  REVIEW OF SYSTEMS:  Review of Systems  Respiratory: Positive for cough and shortness of breath.   Neurological: Positive for numbness.  All other systems reviewed and are negative.    PAST MEDICAL/SURGICAL HISTORY:  Past Medical History:  Diagnosis Date  . Depression   . Glucose intolerance 03/11/2019  . Obesity, Class III, BMI 40-49.9 (morbid obesity) ([place]) 03/11/2019   Past Surgical History:  Procedure Laterality Date  . CESAREAN  SECTION    . IRRIGATION AND DEBRIDEMENT ABSCESS  03/15/2019   RETROPHARYNGEAL   . TONSILLECTOMY AND ADENOIDECTOMY N/A 03/15/2019   Procedure: IRRIGATION AND DEBRIDEMENT OF RETROPHARYNGEAL ABSCESS;  Surgeon: Leta Baptist, MD;  Location: [facility];  Service: ENT;   Laterality: N/A;     SOCIAL HISTORY:  Social History   Socioeconomic History  . Marital status: Widowed    Spouse name: Not on file  . Number of children: 1  . Years of education: Not on file  . Highest education level: Not on file  Occupational History  . Not on file  Tobacco Use  . Smoking status: Current Every Day Smoker    Packs/day: 1.00    Years: 25.00    Pack years: 25.00    Types: Cigarettes  . Smokeless tobacco: Never Used  Substance and Sexual Activity  . Alcohol use: Not Currently    Comment: social   . Drug use: Not Currently  . Sexual activity: Not on file  Other Topics Concern  . Not on file  Social History Narrative   April 04, 2019   Husband passed away four months ago from Cancer of the [place].   Pt. Has 59 yo son at home.   Social Determinants of Health   Financial Resource Strain: Low Risk   . Difficulty of Paying Living Expenses: Not very hard  Food Insecurity: No Food Insecurity  . Worried About Charity fundraiser in the Last Year: Never true  . Ran Out of Food in the Last Year: Never true  Transportation Needs: No Transportation Needs  . Lack of Transportation (Medical): No  . Lack of Transportation (Non-Medical): No  Physical Activity: Inactive  . Days of Exercise per Week: 0 days  . Minutes of Exercise per Session: 0 min  Stress: No Stress Concern Present  . Feeling of Stress : Only a little  Social Connections: Moderately Isolated  . Frequency of Communication with Friends and Family: More than three times a week  . Frequency of Social Gatherings with Friends and Family: Twice a week  . Attends Religious Services: Never  . Active Member of Clubs or Organizations: No  . Attends Archivist Meetings: Never  . Marital Status: Widowed  Intimate Partner Violence: Not At Risk  . Fear of Current or Ex-Partner: No  . Emotionally Abused: No  . Physically Abused: No  . Sexually Abused: No    FAMILY HISTORY:  Family History  Problem  Relation Age of Onset  . Lung cancer Mother   . Hypertension Mother   . Leukemia Father   . Myelodysplastic syndrome Father     CURRENT MEDICATIONS:  Outpatient Encounter Medications as of 11/30/2019  Medication Sig  . apixaban (ELIQUIS) 5 MG TABS tablet Take 1 tablet (5 mg total) by mouth 2 (two) times daily.  [name] CARBOPLATIN IV Inject into the vein every 21 ( twenty-one) days. X 4 cycles  . furosemide (LASIX) 20 MG tablet Take 1 tablet (20 mg total) by mouth once as needed for up to 1 dose (Take '20mg'$  once daily as needed).  . gabapentin (NEURONTIN) 300 MG capsule Take 1 capsule (300 mg total) by mouth 3 (three) times daily. Take 300 mg in the morning, 300 mg at noon and 600 mg at bedtime.  [name] PACLitaxel (TAXOL IV) Inject into the vein every 21 ( twenty-one) days. X 4 cycles  . PEMBROLIZUMAB IV Inject into the vein every 21 ( twenty-one) days.  . [DISCONTINUED] ELIQUIS 5 MG  TABS tablet Take 1 tablet by mouth twice daily  . oxyCODONE (OXY IR/ROXICODONE) 5 MG immediate release tablet Take 1 tablet (5 mg total) by mouth every 4 (four) hours as needed for severe pain. (Patient not taking: Reported on 10/19/2019)  . prochlorperazine (COMPAZINE) 10 MG tablet Take 1 tablet (10 mg total) by mouth every 6 (six) hours as needed (Nausea or vomiting). (Patient not taking: Reported on 09/27/2019)   No facility-administered encounter medications on file as of 11/30/2019.    ALLERGIES:  No Known Allergies   PHYSICAL EXAM:  ECOG Performance status: 1  Vitals:   11/30/19 1201  BP: 110/71  Pulse: (!) 110  Resp: (!) 22  Temp: (!) 97.5 F (36.4 C)  SpO2: 97%   Filed Weights   11/30/19 1201  Weight: 284 lb (128.8 kg)    Physical Exam Constitutional:      Comments: Facial edema   HENT:     Mouth/Throat:     Mouth: Mucous membranes are moist.     Pharynx: Oropharynx is clear.  Eyes:     Extraocular Movements: Extraocular movements intact.     Conjunctiva/sclera: Conjunctivae normal.   Cardiovascular:     Rate and Rhythm: Normal rate and regular rhythm.     Heart sounds: Normal heart sounds.  Pulmonary:     Effort: Pulmonary effort is normal.     Breath sounds: Normal breath sounds.  Abdominal:     General: Bowel sounds are normal. There is no distension.     Palpations: There is no mass.  Musculoskeletal:        General: Normal range of motion.     Cervical back: Normal range of motion.  Skin:    General: Skin is warm.  Neurological:     General: No focal deficit present.     Mental Status: She is alert and oriented to person, place, and time.  Psychiatric:        Mood and Affect: Mood normal.        Behavior: Behavior normal.        Thought Content: Thought content normal.        Judgment: Judgment normal.      LABORATORY DATA:  I have reviewed the labs as listed.  CBC    Component Value Date/Time   WBC 6.9 11/30/2019 1218   RBC 4.32 11/30/2019 1218   HGB 13.5 11/30/2019 1218   HCT 42.5 11/30/2019 1218   PLT 226 11/30/2019 1218   MCV 98.4 11/30/2019 1218   MCH 31.3 11/30/2019 1218   MCHC 31.8 11/30/2019 1218   RDW 13.5 11/30/2019 1218   LYMPHSABS 1.4 11/30/2019 1218   MONOABS 0.5 11/30/2019 1218   EOSABS 0.1 11/30/2019 1218   BASOSABS 0.0 11/30/2019 1218   CMP Latest Ref Rng & Units 11/30/2019 11/09/2019 10/19/2019  Glucose 70 - 99 mg/dL 95 155(H) 112(H)  BUN 6 - 20 mg/dL '14 15 16  '$ Creatinine 0.44 - 1.00 mg/dL 0.85 1.00 1.13(H)  Sodium 135 - 145 mmol/L 137 136 139  Potassium 3.5 - 5.1 mmol/L 4.0 4.2 4.3  Chloride 98 - 111 mmol/L 102 99 100  CO2 22 - 32 mmol/L '27 27 28  '$ Calcium 8.9 - 10.3 mg/dL 8.8(L) 9.3 9.6  Total Protein 6.5 - 8.1 g/dL 7.2 7.7 7.5  Total Bilirubin 0.3 - 1.2 mg/dL 0.5 0.6 0.3  Alkaline Phos 38 - 126 U/L 73 72 68  AST 15 - 41 U/L '26 20 24  '$ ALT 0 - 44  U/L 34 33 28      ASSESSMENT & PLAN:   Squamous cell lung cancer, unspecified laterality ([place]) 1.  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 -7 cycles of carboplatin, paclitaxel and pembrolizumab from 04/08/2019 through 08/16/2019. -Maintenance Keytruda started on 09/06/2019. -CT CAP from 11/07/2019 showed stable disease. -She is continuing to tolerate Keytruda without any immunotherapy related side effects. -We reviewed her labs.  She will proceed with Keytruda today.  We will see her back in 3 weeks for follow-up.  2.  Peripheral neuropathy: -She reports that her neuropathy is well controlled on gabapentin 300 mg 3 times a day.  Occasionally she forgets to take a dose or 2.  3.  Right shoulder pain: -She is using oxycodone at bedtime as needed.  4.  Left basilic vein thrombus: -Ultrasound on 05/03/2019 showed nonocclusive thrombus around the PICC line in the left basilic vein, considered superficial thrombus.  No evidence of DVT. -Because of her metastatic cancer, we have recommended continuing Eliquis.  She is tolerating it very well.  We will give her a refill.      Derek Jack, MD [facility] [phone]

## 2019-12-07 ENCOUNTER — Encounter (HOSPITAL_COMMUNITY): Payer: Self-pay | Admitting: *Deleted

## 2019-12-08 ENCOUNTER — Encounter (HOSPITAL_COMMUNITY): Payer: Self-pay

## 2019-12-21 ENCOUNTER — Inpatient Hospital Stay (HOSPITAL_COMMUNITY): Payer: BC Managed Care – PPO

## 2019-12-21 ENCOUNTER — Encounter (HOSPITAL_COMMUNITY): Payer: Self-pay | Admitting: Hematology

## 2019-12-21 ENCOUNTER — Other Ambulatory Visit: Payer: Self-pay

## 2019-12-21 ENCOUNTER — Inpatient Hospital Stay (HOSPITAL_COMMUNITY): Payer: BC Managed Care – PPO | Admitting: Hematology

## 2019-12-21 VITALS — BP 90/66 | HR 117 | Temp 97.1°F | Resp 18 | Wt 286.4 lb

## 2019-12-21 VITALS — BP 115/73 | HR 111 | Resp 18

## 2019-12-21 DIAGNOSIS — C3490 Malignant neoplasm of unspecified part of unspecified bronchus or lung: Secondary | ICD-10-CM

## 2019-12-21 DIAGNOSIS — C779 Secondary and unspecified malignant neoplasm of lymph node, unspecified: Secondary | ICD-10-CM

## 2019-12-21 DIAGNOSIS — Z5112 Encounter for antineoplastic immunotherapy: Secondary | ICD-10-CM | POA: Diagnosis not present

## 2019-12-21 LAB — CBC WITH DIFFERENTIAL/PLATELET
Abs Immature Granulocytes: 0.06 10*3/uL (ref 0.00–0.07)
Basophils Absolute: 0 10*3/uL (ref 0.0–0.1)
Basophils Relative: 0 %
Eosinophils Absolute: 0.1 10*3/uL (ref 0.0–0.5)
Eosinophils Relative: 1 %
HCT: 45.3 % (ref 36.0–46.0)
Hemoglobin: 14.4 g/dL (ref 12.0–15.0)
Immature Granulocytes: 1 %
Lymphocytes Relative: 15 %
Lymphs Abs: 1.4 10*3/uL (ref 0.7–4.0)
MCH: 30.4 pg (ref 26.0–34.0)
MCHC: 31.8 g/dL (ref 30.0–36.0)
MCV: 95.6 fL (ref 80.0–100.0)
Monocytes Absolute: 0.8 10*3/uL (ref 0.1–1.0)
Monocytes Relative: 8 %
Neutro Abs: 6.8 10*3/uL (ref 1.7–7.7)
Neutrophils Relative %: 75 %
Platelets: 213 10*3/uL (ref 150–400)
RBC: 4.74 MIL/uL (ref 3.87–5.11)
RDW: 13.9 % (ref 11.5–15.5)
WBC: 9.2 10*3/uL (ref 4.0–10.5)
nRBC: 0 % (ref 0.0–0.2)

## 2019-12-21 LAB — COMPREHENSIVE METABOLIC PANEL
ALT: 27 U/L (ref 0–44)
AST: 19 U/L (ref 15–41)
Albumin: 3.8 g/dL (ref 3.5–5.0)
Alkaline Phosphatase: 72 U/L (ref 38–126)
Anion gap: 10 (ref 5–15)
BUN: 10 mg/dL (ref 6–20)
CO2: 25 mmol/L (ref 22–32)
Calcium: 9.2 mg/dL (ref 8.9–10.3)
Chloride: 100 mmol/L (ref 98–111)
Creatinine, Ser: 0.79 mg/dL (ref 0.44–1.00)
GFR calc Af Amer: >60 mL/min (ref >60)
GFR calc non Af Amer: >60 mL/min (ref >60)
Glucose, Bld: 98 mg/dL (ref 70–99)
Potassium: 3.7 mmol/L (ref 3.5–5.1)
Sodium: 135 mmol/L (ref 135–145)
Total Bilirubin: 0.7 mg/dL (ref 0.3–1.2)
Total Protein: 7.6 g/dL (ref 6.5–8.1)

## 2019-12-21 LAB — TSH: TSH: 8.207 u[IU]/mL — ABNORMAL HIGH (ref 0.350–4.500)

## 2019-12-21 MED ORDER — SODIUM CHLORIDE 0.9 % IV SOLN: Freq: Once | INTRAVENOUS | Status: AC

## 2019-12-21 MED ORDER — SODIUM CHLORIDE 0.9 % IV SOLN
200.0000 mg | Freq: Once | INTRAVENOUS | Status: AC
  Administered 2019-12-21: 200 mg via INTRAVENOUS
  Filled 2019-12-21: qty 8

## 2019-12-21 NOTE — Patient Instructions (Signed)
[facility] Discharge Instructions for Patients Receiving Chemotherapy   Beginning January 23rd 2017 lab work for the [facility] will be done in the  Main lab at [facility] on 1st floor. If you have a lab appointment with the [facility] please come in thru the  Main Entrance and check in at the main information desk   Today you received the following chemotherapy agents Keytruda  To help prevent nausea and vomiting after your treatment, we encourage you to take your nausea medication  If you develop nausea and vomiting, or diarrhea that is not controlled by your medication, call the clinic.  The clinic phone number is [phone]. Office hours are Monday-Friday 8:30am-5:00pm.  BELOW ARE SYMPTOMS THAT SHOULD BE REPORTED IMMEDIATELY:  *FEVER GREATER THAN 101.0 F  *CHILLS WITH OR WITHOUT FEVER  NAUSEA AND VOMITING THAT IS NOT CONTROLLED WITH YOUR NAUSEA MEDICATION  *UNUSUAL SHORTNESS OF BREATH  *UNUSUAL BRUISING OR BLEEDING  TENDERNESS IN MOUTH AND THROAT WITH OR WITHOUT PRESENCE OF ULCERS  *URINARY PROBLEMS  *BOWEL PROBLEMS  UNUSUAL RASH Items with * indicate a potential emergency and should be followed up as soon as possible. If you have an emergency after office hours please contact your primary care physician or go to the nearest emergency department.  Please call the clinic during office hours if you have any questions or concerns.   You may also contact the Patient Navigator at [phone] should you have any questions or need assistance in obtaining follow up care.      Resources For Cancer Patients and their Caregivers ? American Cancer Society: Can assist with transportation, wigs, general needs, runs Look Good Feel Better.        724-037-8097 ? Cancer Care: Provides financial assistance, online support groups, medication/co-pay assistance.  1-800-813-HOPE (562)350-8435) ? [facility] [place] cancer  patients and their families through emotional , educational and financial support.  480-293-6083 ? [place] DSS Where to apply for food stamps, Medicaid and utility assistance. 724-291-3353 ? [facility]: Transportation to medical appointments. (431) 396-2498 ? Social Security Administration: May apply for disability if have a Stage IV cancer. 931-339-2754 364-607-1702 ? [employer], Disability and Transit Services: Assists with nutrition, care and transit needs. 2312092325

## 2019-12-21 NOTE — Progress Notes (Signed)
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  BRIEF ONCOLOGIC HISTORY:  Oncology History  Squamous cell lung cancer, unspecified laterality ([place])  04/05/2019 Initial Diagnosis   Squamous cell lung cancer, unspecified laterality ([place])   04/08/2019 -  Chemotherapy   The patient had palonosetron (ALOXI) injection 0.25 mg, 0.25 mg, Intravenous,  Once, 7 of 7 cycles Administration: 0.25 mg (04/08/2019), 0.25 mg (05/02/2019), 0.25 mg (05/23/2019), 0.25 mg (06/13/2019), 0.25 mg (07/05/2019), 0.25 mg (07/26/2019), 0.25 mg (08/16/2019) pegfilgrastim (NEULASTA) injection 6 mg, 6 mg, Subcutaneous, Once, 2 of 2 cycles Administration: 6 mg (04/11/2019), 6 mg (05/04/2019) pegfilgrastim (NEULASTA ONPRO KIT) injection 6 mg, 6 mg, Subcutaneous, Once, 3 of 3 cycles pegfilgrastim-cbqv (UDENYCA) injection 6 mg, 6 mg, Subcutaneous, Once, 5 of 5 cycles Administration: 6 mg (05/25/2019), 6 mg (06/15/2019), 6 mg (07/07/2019), 6 mg (07/28/2019), 6 mg (08/18/2019) CARBOplatin (PARAPLATIN) 900 mg in sodium chloride 0.9 % 500 mL chemo infusion, 900 mg (100 % of original dose 900 mg), Intravenous,  Once, 7 of 7 cycles Dose modification:   (original dose 900 mg, Cycle 1),   (original dose 900 mg, Cycle 2),   (original dose 900 mg, Cycle 3),   (original dose 900 mg, Cycle 4) Administration: 900 mg (04/08/2019), 900 mg (05/02/2019), 900 mg (05/23/2019), 900 mg (06/13/2019), 900 mg (07/05/2019), 900 mg (07/26/2019), 900 mg (08/16/2019) PACLitaxel (TAXOL) 492 mg in sodium chloride 0.9 % 500 mL chemo infusion (> '80mg'$ /m2), 200 mg/m2 = 492 mg, Intravenous,  Once, 7 of 7 cycles Dose modification: 175 mg/m2 (original dose 200 mg/m2, Cycle 6, Reason: Other (see comments), Comment:  neuropathy) Administration: 492 mg (04/08/2019), 492 mg (05/02/2019), 492 mg (05/23/2019), 492 mg (06/13/2019), 492 mg (07/05/2019), 432 mg (07/26/2019), 432 mg (08/16/2019) pembrolizumab (KEYTRUDA) 200 mg in sodium chloride 0.9 % 50 mL chemo infusion, 200 mg, Intravenous, Once, 13 of 16 cycles Administration: 200 mg (04/08/2019), 200 mg (07/05/2019), 200 mg (05/02/2019), 200 mg (05/23/2019), 200 mg (06/13/2019), 200 mg (07/26/2019), 200 mg (08/16/2019), 200 mg (09/06/2019), 200 mg (09/27/2019), 200 mg (10/19/2019), 200 mg (11/09/2019), 200 mg (11/30/2019) fosaprepitant (EMEND) 150 mg, dexamethasone (DECADRON) 12 mg in sodium chloride 0.9 % 145 mL IVPB, , Intravenous,  Once, 7 of 7 cycles Administration:  (04/08/2019),  (05/02/2019),  (05/23/2019),  (06/13/2019),  (07/05/2019),  (07/26/2019),  (08/16/2019)  for chemotherapy treatment.        INTERVAL HISTORY:  [name] 39 y.o. female seen for follow-up of metastatic squamous cell carcinoma of the lung and toxicity assessment prior to her next cycle of immunotherapy.  She reports average 100%.  Energy levels are low.  Shortness of breath on exertion is stable.  She reports her appetite has been very good and she has been gaining weight.  She often forgets to take third dose of gabapentin.  She is taking gabapentin 300 mg twice daily.  Numbness in the feet is controlled.  She rarely takes oxycodone for her shoulder pain.  She had to take couple of doses last week.  She is also taking Eliquis without any problems.  Denies any diarrhea or skin rashes.  REVIEW OF SYSTEMS:  Review of Systems  Respiratory: Positive for cough and shortness of breath.   Neurological: Positive for numbness.  All other  systems reviewed and are negative.    PAST MEDICAL/SURGICAL HISTORY:  Past Medical History:  Diagnosis Date  . Depression   . Glucose intolerance 03/11/2019  . Obesity, Class III, BMI 40-49.9 (morbid obesity) ([place]) 03/11/2019   Past Surgical History:  Procedure Laterality Date   . CESAREAN SECTION    . IRRIGATION AND DEBRIDEMENT ABSCESS  03/15/2019   RETROPHARYNGEAL   . TONSILLECTOMY AND ADENOIDECTOMY N/A 03/15/2019   Procedure: IRRIGATION AND DEBRIDEMENT OF RETROPHARYNGEAL ABSCESS;  Surgeon: Leta Baptist, MD;  Location: [facility];  Service: ENT;  Laterality: N/A;     SOCIAL HISTORY:  Social History   Socioeconomic History  . Marital status: Widowed    Spouse name: Not on file  . Number of children: 1  . Years of education: Not on file  . Highest education level: Not on file  Occupational History  . Not on file  Tobacco Use  . Smoking status: Current Every Day Smoker    Packs/day: 1.00    Years: 25.00    Pack years: 25.00    Types: Cigarettes  . Smokeless tobacco: Never Used  Substance and Sexual Activity  . Alcohol use: Not Currently    Comment: social   . Drug use: Not Currently  . Sexual activity: Not on file  Other Topics Concern  . Not on file  Social History Narrative   04/03/19   Husband passed away four months ago from Cancer of the [place].   Pt. Has 84 yo son at home.   Social Determinants of Health   Financial Resource Strain: Low Risk   . Difficulty of Paying Living Expenses: Not very hard  Food Insecurity: No Food Insecurity  . Worried About Charity fundraiser in the Last Year: Never true  . Ran Out of Food in the Last Year: Never true  Transportation Needs: No Transportation Needs  . Lack of Transportation (Medical): No  . Lack of Transportation (Non-Medical): No  Physical Activity: Inactive  . Days of Exercise per Week: 0 days  . Minutes of Exercise per Session: 0 min  Stress: No Stress Concern Present  . Feeling of Stress : Only a little  Social Connections: Moderately Isolated  . Frequency of Communication with Friends and Family: More than three times a week  . Frequency of Social Gatherings with Friends and Family: Twice a week  . Attends Religious Services: Never  . Active Member of Clubs or Organizations: No  . Attends  Archivist Meetings: Never  . Marital Status: Widowed  Intimate Partner Violence: Not At Risk  . Fear of Current or Ex-Partner: No  . Emotionally Abused: No  . Physically Abused: No  . Sexually Abused: No    FAMILY HISTORY:  Family History  Problem Relation Age of Onset  . Lung cancer Mother   . Hypertension Mother   . Leukemia Father   . Myelodysplastic syndrome Father     CURRENT MEDICATIONS:  Outpatient Encounter Medications as of 12/21/2019  Medication Sig  . oxyCODONE (OXY IR/ROXICODONE) 5 MG immediate release tablet Take 1 tablet (5 mg total) by mouth every 4 (four) hours as needed for severe pain.  [name] apixaban (ELIQUIS) 5 MG TABS tablet Take 1 tablet (5 mg total) by mouth 2 (two) times daily.  [name] CARBOPLATIN IV Inject into the vein every 21 ( twenty-one) days. X 4 cycles  . furosemide (LASIX) 20 MG tablet Take 1 tablet (20 mg total) by mouth once as needed for up to 1  dose (Take '20mg'$  once daily as needed).  . gabapentin (NEURONTIN) 300 MG capsule Take 1 capsule (300 mg total) by mouth 3 (three) times daily. Take 300 mg in the morning, 300 mg at noon and 600 mg at bedtime. (Patient taking differently: Take 300 mg by mouth 3 (three) times daily. Take 300 mg in the morning, 300 mg at noon and 600 mg at bedtime.  Per pt, she only takes twice a day)  . PACLitaxel (TAXOL IV) Inject into the vein every 21 ( twenty-one) days. X 4 cycles  . PEMBROLIZUMAB IV Inject into the vein every 21 ( twenty-one) days.  . prochlorperazine (COMPAZINE) 10 MG tablet Take 1 tablet (10 mg total) by mouth every 6 (six) hours as needed (Nausea or vomiting). (Patient not taking: Reported on 09/27/2019)   No facility-administered encounter medications on file as of 12/21/2019.    ALLERGIES:  No Known Allergies   PHYSICAL EXAM:  ECOG Performance status: 1  Vitals:   12/21/19 1111  BP: 90/66  Pulse: (!) 117  Resp: 18  Temp: (!) 97.1 F (36.2 C)  SpO2: 95%   Filed Weights   12/21/19 1111   Weight: 286 lb 6.4 oz (129.9 kg)    Physical Exam Constitutional:      Comments: Facial edema   HENT:     Mouth/Throat:     Mouth: Mucous membranes are moist.     Pharynx: Oropharynx is clear.  Eyes:     Extraocular Movements: Extraocular movements intact.     Conjunctiva/sclera: Conjunctivae normal.  Cardiovascular:     Rate and Rhythm: Normal rate and regular rhythm.     Heart sounds: Normal heart sounds.  Pulmonary:     Effort: Pulmonary effort is normal.     Breath sounds: Normal breath sounds.  Abdominal:     General: Bowel sounds are normal. There is no distension.     Palpations: There is no mass.  Musculoskeletal:        General: Normal range of motion.     Cervical back: Normal range of motion.  Skin:    General: Skin is warm.  Neurological:     General: No focal deficit present.     Mental Status: She is alert and oriented to person, place, and time.  Psychiatric:        Mood and Affect: Mood normal.        Behavior: Behavior normal.        Thought Content: Thought content normal.        Judgment: Judgment normal.      LABORATORY DATA:  I have reviewed the labs as listed.  CBC    Component Value Date/Time   WBC 9.2 12/21/2019 1035   RBC 4.74 12/21/2019 1035   HGB 14.4 12/21/2019 1035   HCT 45.3 12/21/2019 1035   PLT 213 12/21/2019 1035   MCV 95.6 12/21/2019 1035   MCH 30.4 12/21/2019 1035   MCHC 31.8 12/21/2019 1035   RDW 13.9 12/21/2019 1035   LYMPHSABS 1.4 12/21/2019 1035   MONOABS 0.8 12/21/2019 1035   EOSABS 0.1 12/21/2019 1035   BASOSABS 0.0 12/21/2019 1035   CMP Latest Ref Rng & Units 12/21/2019 11/30/2019 11/09/2019  Glucose 70 - 99 mg/dL 98 95 155(H)  BUN 6 - 20 mg/dL '10 14 15  '$ Creatinine 0.44 - 1.00 mg/dL 0.79 0.85 1.00  Sodium 135 - 145 mmol/L 135 137 136  Potassium 3.5 - 5.1 mmol/L 3.7 4.0 4.2  Chloride 98 - 111  mmol/L 100 102 99  CO2 22 - 32 mmol/L '25 27 27  '$ Calcium 8.9 - 10.3 mg/dL 9.2 8.8(L) 9.3  Total Protein 6.5 - 8.1 g/dL  7.6 7.2 7.7  Total Bilirubin 0.3 - 1.2 mg/dL 0.7 0.5 0.6  Alkaline Phos 38 - 126 U/L 72 73 72  AST 15 - 41 U/L '19 26 20  '$ ALT 0 - 44 U/L 27 34 33      ASSESSMENT & PLAN:   Squamous cell lung cancer, unspecified laterality ([place]) 1.  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 -7 cycles of carboplatin, paclitaxel and pembrolizumab from 04/08/2019 through 08/16/2019. -Maintenance pembrolizumab started on 09/06/2019. -She does not have any immunotherapy related side effects at this time. -Last CT CAP reviewed by me on 11/07/2019 showed stable disease. -We reviewed her CBC which is grossly within normal limits.  LFTs were normal.  Creatinine and lites were normal.  We will send another TSH level today.  Last TSH was 6.5 on 10/19/2019. -She will proceed with her Keytruda today.  She will come back in 3 weeks for follow-up.  I plan to repeat scans in 6 weeks.  2.  Peripheral neuropathy: -She has some neuropathy in the toes which is controlled with gabapentin 300 mg twice a day.  3.  Right shoulder pain: -She is taking oxycodone as needed.  She took 2 pills last week and has not taken anymore since last 2 months.  4.  Left basilic vein thrombus: -Ultrasound on 05/03/2019 showed nonocclusive thrombus around the PICC line in the left basilic vein, considered superficial thrombus.  No evidence of DVT. -Because of her metastatic cancer, we have recommended continuing Eliquis. -She is tolerating Eliquis without any bleeding problems.      Derek Jack, MD [facility] [phone]

## 2019-12-21 NOTE — Assessment & Plan Note (Signed)
1.  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 -7 cycles of carboplatin, paclitaxel and pembrolizumab from 04/08/2019 through 08/16/2019. -Maintenance pembrolizumab started on 09/06/2019. -She does not have any immunotherapy related side effects at this time. -Last CT CAP reviewed by me on 11/07/2019 showed stable disease. -We reviewed her CBC which is grossly within normal limits.  LFTs were normal.  Creatinine and lites were normal.  We will send another TSH level today.  Last TSH was 6.5 on 10/19/2019. -She will proceed with her Keytruda today.  She will come back in 3 weeks for follow-up.  I plan to repeat scans in 6 weeks.  2.  Peripheral neuropathy: -She has some neuropathy in the toes which is controlled with gabapentin 300 mg twice a day.  3.  Right shoulder pain: -She is taking oxycodone as needed.  She took 2 pills last week and has not taken anymore since last 2 months.  4.  Left basilic vein thrombus: -Ultrasound on 05/03/2019 showed nonocclusive thrombus around the PICC line in the left basilic vein, considered superficial thrombus.  No evidence of DVT. -Because of her metastatic cancer, we have recommended continuing Eliquis. -She is tolerating Eliquis without any bleeding problems.

## 2019-12-21 NOTE — Progress Notes (Signed)
Patient has been assessed, vital signs and labs have been reviewed by Dr. Katragadda. ANC, Creatinine, LFTs, and Platelets are within treatment parameters per Dr. Katragadda. The patient is good to proceed with treatment at this time.  

## 2019-12-21 NOTE — Progress Notes (Signed)
Nani Skillern tolerated treatment without incident or complaint. VSS. Discharged in satisfactory condition with follow up instructions.

## 2019-12-21 NOTE — Patient Instructions (Addendum)
[facility] at [facility] Discharge Instructions  You were seen today by Dr. Katragadda. He went over your recent lab results. He will see you back in 3 weeks for labs and follow up.   Thank you for choosing [facility] at [facility] to provide your oncology and hematology care.  To afford each patient quality time with our provider, please arrive at least 15 minutes before your scheduled appointment time.   If you have a lab appointment with the Cancer Center please come in thru the  Main Entrance and check in at the main information desk  You need to re-schedule your appointment should you arrive 10 or more minutes late.  We strive to give you quality time with our providers, and arriving late affects you and other patients whose appointments are after yours.  Also, if you no show three or more times for appointments you may be dismissed from the clinic at the providers discretion.     Again, thank you for choosing [facility].  Our hope is that these requests will decrease the amount of time that you wait before being seen by our physicians.       _____________________________________________________________  Should you have questions after your visit to [facility], please contact our office at [phone] between the hours of 8:00 a.m. and 4:30 p.m.  Voicemails left after 4:00 p.m. will not be returned until the following business day.  For prescription refill requests, have your pharmacy contact our office and allow 72 hours.    Cancer Center Support Programs:   > Cancer Support Group  2nd Tuesday of the month 1pm-2pm, Journey Room    

## 2020-01-12 ENCOUNTER — Encounter (HOSPITAL_COMMUNITY): Payer: Self-pay | Admitting: Hematology

## 2020-01-12 ENCOUNTER — Encounter (HOSPITAL_COMMUNITY): Payer: Self-pay | Admitting: *Deleted

## 2020-01-12 ENCOUNTER — Inpatient Hospital Stay (HOSPITAL_COMMUNITY): Payer: BC Managed Care – PPO

## 2020-01-12 ENCOUNTER — Inpatient Hospital Stay (HOSPITAL_COMMUNITY): Payer: BC Managed Care – PPO | Admitting: Hematology

## 2020-01-12 ENCOUNTER — Other Ambulatory Visit: Payer: Self-pay

## 2020-01-12 ENCOUNTER — Inpatient Hospital Stay (HOSPITAL_COMMUNITY): Payer: BC Managed Care – PPO | Attending: Hematology

## 2020-01-12 VITALS — BP 136/82 | HR 101 | Temp 97.9°F | Resp 20

## 2020-01-12 VITALS — BP 132/103 | HR 111 | Temp 97.1°F | Resp 19 | Wt 291.6 lb

## 2020-01-12 DIAGNOSIS — Z79899 Other long term (current) drug therapy: Secondary | ICD-10-CM | POA: Diagnosis not present

## 2020-01-12 DIAGNOSIS — F17210 Nicotine dependence, cigarettes, uncomplicated: Secondary | ICD-10-CM | POA: Diagnosis not present

## 2020-01-12 DIAGNOSIS — Z8249 Family history of ischemic heart disease and other diseases of the circulatory system: Secondary | ICD-10-CM | POA: Diagnosis not present

## 2020-01-12 DIAGNOSIS — Z86718 Personal history of other venous thrombosis and embolism: Secondary | ICD-10-CM | POA: Diagnosis not present

## 2020-01-12 DIAGNOSIS — C3490 Malignant neoplasm of unspecified part of unspecified bronchus or lung: Secondary | ICD-10-CM

## 2020-01-12 DIAGNOSIS — M25511 Pain in right shoulder: Secondary | ICD-10-CM | POA: Insufficient documentation

## 2020-01-12 DIAGNOSIS — Z5112 Encounter for antineoplastic immunotherapy: Secondary | ICD-10-CM | POA: Insufficient documentation

## 2020-01-12 DIAGNOSIS — G629 Polyneuropathy, unspecified: Secondary | ICD-10-CM | POA: Diagnosis not present

## 2020-01-12 DIAGNOSIS — H538 Other visual disturbances: Secondary | ICD-10-CM | POA: Insufficient documentation

## 2020-01-12 DIAGNOSIS — E039 Hypothyroidism, unspecified: Secondary | ICD-10-CM | POA: Insufficient documentation

## 2020-01-12 DIAGNOSIS — Z7901 Long term (current) use of anticoagulants: Secondary | ICD-10-CM | POA: Insufficient documentation

## 2020-01-12 DIAGNOSIS — Z801 Family history of malignant neoplasm of trachea, bronchus and lung: Secondary | ICD-10-CM | POA: Insufficient documentation

## 2020-01-12 DIAGNOSIS — E279 Disorder of adrenal gland, unspecified: Secondary | ICD-10-CM | POA: Diagnosis not present

## 2020-01-12 DIAGNOSIS — C779 Secondary and unspecified malignant neoplasm of lymph node, unspecified: Secondary | ICD-10-CM

## 2020-01-12 LAB — COMPREHENSIVE METABOLIC PANEL
ALT: 65 U/L — ABNORMAL HIGH (ref 0–44)
AST: 37 U/L (ref 15–41)
Albumin: 4.1 g/dL (ref 3.5–5.0)
Alkaline Phosphatase: 67 U/L (ref 38–126)
Anion gap: 10 (ref 5–15)
BUN: 16 mg/dL (ref 6–20)
CO2: 27 mmol/L (ref 22–32)
Calcium: 9.3 mg/dL (ref 8.9–10.3)
Chloride: 101 mmol/L (ref 98–111)
Creatinine, Ser: 0.86 mg/dL (ref 0.44–1.00)
GFR calc Af Amer: >60 mL/min (ref >60)
GFR calc non Af Amer: >60 mL/min (ref >60)
Glucose, Bld: 111 mg/dL — ABNORMAL HIGH (ref 70–99)
Potassium: 4.1 mmol/L (ref 3.5–5.1)
Sodium: 138 mmol/L (ref 135–145)
Total Bilirubin: 0.5 mg/dL (ref 0.3–1.2)
Total Protein: 7.6 g/dL (ref 6.5–8.1)

## 2020-01-12 LAB — CBC WITH DIFFERENTIAL/PLATELET
Abs Immature Granulocytes: 0.04 10*3/uL (ref 0.00–0.07)
Basophils Absolute: 0 10*3/uL (ref 0.0–0.1)
Basophils Relative: 1 %
Eosinophils Absolute: 0.1 10*3/uL (ref 0.0–0.5)
Eosinophils Relative: 1 %
HCT: 46.1 % — ABNORMAL HIGH (ref 36.0–46.0)
Hemoglobin: 14.6 g/dL (ref 12.0–15.0)
Immature Granulocytes: 1 %
Lymphocytes Relative: 15 %
Lymphs Abs: 1 10*3/uL (ref 0.7–4.0)
MCH: 30.2 pg (ref 26.0–34.0)
MCHC: 31.7 g/dL (ref 30.0–36.0)
MCV: 95.2 fL (ref 80.0–100.0)
Monocytes Absolute: 0.5 10*3/uL (ref 0.1–1.0)
Monocytes Relative: 7 %
Neutro Abs: 4.7 10*3/uL (ref 1.7–7.7)
Neutrophils Relative %: 75 %
Platelets: 242 10*3/uL (ref 150–400)
RBC: 4.84 MIL/uL (ref 3.87–5.11)
RDW: 13.8 % (ref 11.5–15.5)
WBC: 6.3 10*3/uL (ref 4.0–10.5)
nRBC: 0 % (ref 0.0–0.2)

## 2020-01-12 LAB — TSH: TSH: 3.593 u[IU]/mL (ref 0.350–4.500)

## 2020-01-12 MED ORDER — SODIUM CHLORIDE 0.9 % IV SOLN
200.0000 mg | Freq: Once | INTRAVENOUS | Status: AC
  Administered 2020-01-12: 200 mg via INTRAVENOUS
  Filled 2020-01-12: qty 8

## 2020-01-12 MED ORDER — SODIUM CHLORIDE 0.9 % IV SOLN: Freq: Once | INTRAVENOUS | Status: AC

## 2020-01-12 NOTE — Assessment & Plan Note (Signed)
1.  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 -7 cycles of carboplatin, paclitaxel and pembrolizumab from 04/08/2019 through 08/16/2019. -Maintenance pembrolizumab started on 09/06/2019. -She denies any diarrhea, skin rashes or dry cough.  She does have dry skin and itching. -She complained of blurring of her vision when she tries to read which is new for 1 week.  Denies any double vision.  She normally wears glasses for distance vision which is fine. -We reviewed her labs today.  CBC and LFTs were within normal limits.  Creatinine was normal. -She will proceed with her Keytruda today. -I will see her back in 3 weeks for follow-up.  I plan to do restaging scans with CT CAP. -We will also do MRI of the brain with and without contrast.  2.  Peripheral neuropathy: -She has some neuropathy in the toes which is controlled well with gabapentin 300 mg twice daily.  3.  Right shoulder pain: -She is taking oxycodone as needed.  She has not required it in the last few weeks.  4.  Left basilic vein thrombus: -Ultrasound on 05/03/2019 shows nonocclusive thrombus around the PICC line in the left basilic vein, considered superficial thrombus.  No evidence of DVT. -Because of her metastatic cancer, we have recommended continuing Eliquis.  No bleeding issues.  5.  Thyroid abnormality: -TSH on 12/21/2019 was elevated at 8.207.  However TSH today has come down to 3.0.  Hence will not start on any Synthroid.

## 2020-01-12 NOTE — Progress Notes (Signed)
Patient has been assessed, vital signs and labs have been reviewed by Dr. Katragadda. ANC, Creatinine, LFTs, and Platelets are within treatment parameters per Dr. Katragadda. The patient is good to proceed with treatment at this time.  

## 2020-01-12 NOTE — Progress Notes (Signed)
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  BRIEF ONCOLOGIC HISTORY:  Oncology History  Squamous cell lung cancer, unspecified laterality ([place])  04/05/2019 Initial Diagnosis   Squamous cell lung cancer, unspecified laterality ([place])   04/08/2019 -  Chemotherapy   The patient had palonosetron (ALOXI) injection 0.25 mg, 0.25 mg, Intravenous,  Once, 7 of 7 cycles Administration: 0.25 mg (04/08/2019), 0.25 mg (05/02/2019), 0.25 mg (05/23/2019), 0.25 mg (06/13/2019), 0.25 mg (07/05/2019), 0.25 mg (07/26/2019), 0.25 mg (08/16/2019) pegfilgrastim (NEULASTA) injection 6 mg, 6 mg, Subcutaneous, Once, 2 of 2 cycles Administration: 6 mg (04/11/2019), 6 mg (05/04/2019) pegfilgrastim (NEULASTA ONPRO KIT) injection 6 mg, 6 mg, Subcutaneous, Once, 3 of 3 cycles pegfilgrastim-cbqv (UDENYCA) injection 6 mg, 6 mg, Subcutaneous, Once, 5 of 5 cycles Administration: 6 mg (05/25/2019), 6 mg (06/15/2019), 6 mg (07/07/2019), 6 mg (07/28/2019), 6 mg (08/18/2019) CARBOplatin (PARAPLATIN) 900 mg in sodium chloride 0.9 % 500 mL chemo infusion, 900 mg (100 % of original dose 900 mg), Intravenous,  Once, 7 of 7 cycles Dose modification:   (original dose 900 mg, Cycle 1),   (original dose 900 mg, Cycle 2),   (original dose 900 mg, Cycle 3),   (original dose 900 mg, Cycle 4) Administration: 900 mg (04/08/2019), 900 mg (05/02/2019), 900 mg (05/23/2019), 900 mg (06/13/2019), 900 mg (07/05/2019), 900 mg (07/26/2019), 900 mg (08/16/2019) PACLitaxel (TAXOL) 492 mg in sodium chloride 0.9 % 500 mL chemo infusion (> '80mg'$ /m2), 200 mg/m2 = 492 mg, Intravenous,  Once, 7 of 7 cycles Dose modification: 175 mg/m2 (original dose 200 mg/m2, Cycle 6, Reason: Other (see comments), Comment:  neuropathy) Administration: 492 mg (04/08/2019), 492 mg (05/02/2019), 492 mg (05/23/2019), 492 mg (06/13/2019), 492 mg (07/05/2019), 432 mg (07/26/2019), 432 mg (08/16/2019) pembrolizumab (KEYTRUDA) 200 mg in sodium chloride 0.9 % 50 mL chemo infusion, 200 mg, Intravenous, Once, 14 of 16 cycles Administration: 200 mg (04/08/2019), 200 mg (07/05/2019), 200 mg (05/02/2019), 200 mg (05/23/2019), 200 mg (06/13/2019), 200 mg (07/26/2019), 200 mg (08/16/2019), 200 mg (09/06/2019), 200 mg (09/27/2019), 200 mg (10/19/2019), 200 mg (11/09/2019), 200 mg (11/30/2019), 200 mg (12/21/2019), 200 mg (01/12/2020) fosaprepitant (EMEND) 150 mg, dexamethasone (DECADRON) 12 mg in sodium chloride 0.9 % 145 mL IVPB, , Intravenous,  Once, 7 of 7 cycles Administration:  (04/08/2019),  (05/02/2019),  (05/23/2019),  (06/13/2019),  (07/05/2019),  (07/26/2019),  (08/16/2019)  for chemotherapy treatment.        INTERVAL HISTORY:  Ms. Teegarden 39 y.o. 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  She is also here for toxicity assessment prior to next cycle of immunotherapy.  Appetite is 100%.  Energy levels are 50%.  Reports blurring of vision for the last 1 week.  Blurring is present when she tries to read.  She wears glasses for distant vision normal.  She is taking Eliquis without any bleeding.  She is not requiring oxycodone on regular basis.  She takes gabapentin twice daily.  REVIEW OF SYSTEMS:  Review of Systems  Eyes:       Blurring of vision for 1 week.  Respiratory: Positive for cough.   Neurological: Positive for numbness.  Psychiatric/Behavioral: The patient is nervous/anxious.   All other systems reviewed and are negative.  PAST MEDICAL/SURGICAL HISTORY:  Past Medical History:  Diagnosis Date   Depression    Glucose intolerance 03/11/2019   Obesity, Class III, BMI 40-49.9 (morbid obesity) ([place]) 03/11/2019   Past Surgical History:  Procedure Laterality Date   CESAREAN SECTION      IRRIGATION AND DEBRIDEMENT ABSCESS  03/15/2019   RETROPHARYNGEAL    TONSILLECTOMY AND ADENOIDECTOMY N/A 03/15/2019   Procedure: IRRIGATION AND DEBRIDEMENT OF RETROPHARYNGEAL ABSCESS;  Surgeon: Leta Baptist, MD;  Location: [facility];  Service: ENT;  Laterality: N/A;     SOCIAL HISTORY:  Social History   Socioeconomic History   Marital status: Widowed    Spouse name: Not on file   Number of children: 1   Years of education: Not on file   Highest education level: Not on file  Occupational History   Not on file  Tobacco Use   Smoking status: Current Every Day Smoker    Packs/day: 1.00    Years: 25.00    Pack years: 25.00    Types: Cigarettes   Smokeless tobacco: Never Used  Substance and Sexual Activity   Alcohol use: Not Currently    Comment: social    Drug use: Not Currently   Sexual activity: Not on file  Other Topics Concern   Not on file  Social History Narrative   Mar 14, 2019   Husband passed away four months ago from Cancer of the [place].   Pt. Has 57 yo son at home.   Social Determinants of Health   Financial Resource Strain: Low Risk    Difficulty of Paying Living Expenses: Not very hard  Food Insecurity: No Food Insecurity   Worried About Charity fundraiser in the Last Year: Never true   Ran Out of Food in the Last Year: Never true  Transportation Needs: No Transportation Needs   Lack of Transportation (Medical): No   Lack of Transportation (Non-Medical): No  Physical Activity: Inactive   Days of Exercise per Week: 0 days   Minutes of Exercise per Session: 0 min  Stress: No Stress Concern Present   Feeling of Stress : Only a little  Social Connections: Moderately Isolated   Frequency of Communication with Friends and Family: More than three times a week   Frequency of Social Gatherings with Friends and Family: Twice a week   Attends Religious Services: Never   Marine scientist or Organizations: No   Attends Archivist  Meetings: Never   Marital Status: Widowed  Human resources officer Violence: Not At Risk   Fear of Current or Ex-Partner: No   Emotionally Abused: No   Physically Abused: No   Sexually Abused: No    FAMILY HISTORY:  Family History  Problem Relation Age of Onset   Lung cancer Mother    Hypertension Mother    Leukemia Father    Myelodysplastic syndrome Father     CURRENT MEDICATIONS:  Outpatient Encounter Medications as of 01/12/2020  Medication Sig   apixaban (ELIQUIS) 5 MG TABS tablet Take 1 tablet (5 mg total) by mouth 2 (two) times daily.   CARBOPLATIN IV Inject into the vein every 21 ( twenty-one) days. X 4 cycles   gabapentin (NEURONTIN) 300 MG capsule Take 1 capsule (300 mg total) by mouth 3 (three) times daily. Take 300 mg in the morning, 300 mg at noon and 600 mg at bedtime. (Patient taking differently: Take 300 mg by mouth 3 (three) times daily. Take 300 mg in the morning, 300 mg at noon and  600 mg at bedtime.  Per pt, she only takes twice a day)   PACLitaxel (TAXOL IV) Inject into the vein every 21 ( twenty-one) days. X 4 cycles   PEMBROLIZUMAB IV Inject into the vein every 21 ( twenty-one) days.   furosemide (LASIX) 20 MG tablet Take 1 tablet (20 mg total) by mouth once as needed for up to 1 dose (Take '20mg'$  once daily as needed). (Patient not taking: Reported on 01/12/2020)   oxyCODONE (OXY IR/ROXICODONE) 5 MG immediate release tablet Take 1 tablet (5 mg total) by mouth every 4 (four) hours as needed for severe pain. (Patient not taking: Reported on 01/12/2020)   prochlorperazine (COMPAZINE) 10 MG tablet Take 1 tablet (10 mg total) by mouth every 6 (six) hours as needed (Nausea or vomiting). (Patient not taking: Reported on 09/27/2019)   No facility-administered encounter medications on file as of 01/12/2020.    ALLERGIES:  No Known Allergies   PHYSICAL EXAM:  ECOG Performance status: 1  Vitals:   01/12/20 1130  BP: (!) 132/103  Pulse: (!) 111  Resp: 19   Temp: (!) 97.1 F (36.2 C)  SpO2: 91%   Filed Weights   01/12/20 1130  Weight: 291 lb 9.6 oz (132.3 kg)    Physical Exam Constitutional:      Comments: Facial edema   HENT:     Mouth/Throat:     Mouth: Mucous membranes are moist.     Pharynx: Oropharynx is clear.  Eyes:     Extraocular Movements: Extraocular movements intact.     Conjunctiva/sclera: Conjunctivae normal.  Cardiovascular:     Rate and Rhythm: Normal rate and regular rhythm.     Heart sounds: Normal heart sounds.  Pulmonary:     Effort: Pulmonary effort is normal.     Breath sounds: Normal breath sounds.  Abdominal:     General: Bowel sounds are normal. There is no distension.     Palpations: There is no mass.  Musculoskeletal:        General: Normal range of motion.     Cervical back: Normal range of motion.  Skin:    General: Skin is warm.  Neurological:     General: No focal deficit present.     Mental Status: She is alert and oriented to person, place, and time.  Psychiatric:        Mood and Affect: Mood normal.        Behavior: Behavior normal.        Thought Content: Thought content normal.        Judgment: Judgment normal.      LABORATORY DATA:  I have reviewed the labs as listed.  CBC    Component Value Date/Time   WBC 6.3 01/12/2020 1031   RBC 4.84 01/12/2020 1031   HGB 14.6 01/12/2020 1031   HCT 46.1 (H) 01/12/2020 1031   PLT 242 01/12/2020 1031   MCV 95.2 01/12/2020 1031   MCH 30.2 01/12/2020 1031   MCHC 31.7 01/12/2020 1031   RDW 13.8 01/12/2020 1031   LYMPHSABS 1.0 01/12/2020 1031   MONOABS 0.5 01/12/2020 1031   EOSABS 0.1 01/12/2020 1031   BASOSABS 0.0 01/12/2020 1031   CMP Latest Ref Rng & Units 01/12/2020 12/21/2019 11/30/2019  Glucose 70 - 99 mg/dL 111(H) 98 95  BUN 6 - 20 mg/dL '16 10 14  '$ Creatinine 0.44 - 1.00 mg/dL 0.86 0.79 0.85  Sodium 135 - 145 mmol/L 138 135 137  Potassium 3.5 - 5.1 mmol/L 4.1 3.7  4.0  Chloride 98 - 111 mmol/L 101 100 102  CO2 22 - 32 mmol/L '27  25 27  '$ Calcium 8.9 - 10.3 mg/dL 9.3 9.2 8.8(L)  Total Protein 6.5 - 8.1 g/dL 7.6 7.6 7.2  Total Bilirubin 0.3 - 1.2 mg/dL 0.5 0.7 0.5  Alkaline Phos 38 - 126 U/L 67 72 73  AST 15 - 41 U/L 37 19 26  ALT 0 - 44 U/L 65(H) 27 34   I have independently reviewed her scans.   ASSESSMENT & PLAN:   Squamous cell lung cancer, unspecified laterality ([place]) 1.  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 -7 cycles of carboplatin, paclitaxel and pembrolizumab from 04/08/2019 through 08/16/2019. -Maintenance pembrolizumab started on 09/06/2019. -She denies any diarrhea, skin rashes or dry cough.  She does have dry skin and itching. -She complained of blurring of her vision when she tries to read which is new for 1 week.  Denies any double vision.  She normally wears glasses for distance vision which is fine. -We reviewed her labs today.  CBC and LFTs were within normal limits.  Creatinine was normal. -She will proceed with her Keytruda today. -I will see her back in 3 weeks for follow-up.  I plan to do restaging scans with CT CAP. -We will also do MRI of the brain with and without contrast.  2.  Peripheral neuropathy: -She has some neuropathy in the toes which is controlled well with gabapentin 300 mg twice daily.  3.  Right shoulder pain: -She is taking oxycodone as needed.  She has not required it in the last few weeks.  4.  Left basilic vein thrombus: -Ultrasound on 05/03/2019 shows nonocclusive thrombus around the PICC line in the left basilic vein, considered superficial thrombus.  No evidence of DVT. -Because of her metastatic cancer, we have recommended continuing Eliquis.  No bleeding issues.  5.  Thyroid abnormality: -TSH on 12/21/2019 was elevated at 8.207.  However TSH today has come down to 3.0.  Hence will not start on any Synthroid.      Derek Jack, MD [place] [phone]

## 2020-01-12 NOTE — Progress Notes (Signed)
1200- Lab results and vital signs reviewed and patient seen by Dr. Delton Coombes who approved patient for Keytruda infusion today.  Tara Aguilar tolerated infusion without incident or complaint. VSS prior to and postr infusion. Peripheral IV site noted for blood by 2 RNs prior to and after infusion. IV discontinued, site clean and dry. Discharged self ambulatory in satisfactory condition.

## 2020-01-12 NOTE — Patient Instructions (Signed)
[facility] Discharge Instructions for Patients Receiving Chemotherapy   Beginning January 23rd 2017 lab work for the [facility] will be done in the  Main lab at [facility] on 1st floor. If you have a lab appointment with the [facility] please come in thru the  Main Entrance and check in at the main information desk   Today you received the following chemotherapy agents Keytruda  To help prevent nausea and vomiting after your treatment, we encourage you to take your nausea medication   If you develop nausea and vomiting, or diarrhea that is not controlled by your medication, call the clinic.  The clinic phone number is [phone]. Office hours are Monday-Friday 8:30am-5:00pm.  BELOW ARE SYMPTOMS THAT SHOULD BE REPORTED IMMEDIATELY:  *FEVER GREATER THAN 101.0 F  *CHILLS WITH OR WITHOUT FEVER  NAUSEA AND VOMITING THAT IS NOT CONTROLLED WITH YOUR NAUSEA MEDICATION  *UNUSUAL SHORTNESS OF BREATH  *UNUSUAL BRUISING OR BLEEDING  TENDERNESS IN MOUTH AND THROAT WITH OR WITHOUT PRESENCE OF ULCERS  *URINARY PROBLEMS  *BOWEL PROBLEMS  UNUSUAL RASH Items with * indicate a potential emergency and should be followed up as soon as possible. If you have an emergency after office hours please contact your primary care physician or go to the nearest emergency department.  Please call the clinic during office hours if you have any questions or concerns.   You may also contact the Patient Navigator at [phone] should you have any questions or need assistance in obtaining follow up care.      Resources For Cancer Patients and their Caregivers ? American Cancer Society: Can assist with transportation, wigs, general needs, runs Look Good Feel Better.        219-786-0233 ? Cancer Care: Provides financial assistance, online support groups, medication/co-pay assistance.  1-800-813-HOPE (970) 349-5815) ? [facility] Assists [place] cancer  patients and their families through emotional , educational and financial support.  (475)215-2452 ? [place] DSS Where to apply for food stamps, Medicaid and utility assistance. 239-741-9824 ? [facility]: Transportation to medical appointments. 680-220-0840 ? Social Security Administration: May apply for disability if have a Stage IV cancer. (514)166-4995 [phone] [phone] ? [employer], Disability and Transit Services: Assists with nutrition, care and transit needs. (579)015-8933

## 2020-01-12 NOTE — Patient Instructions (Addendum)
[place] at [facility] Discharge Instructions  You were seen today by Dr. Delton Coombes. He went over your recent lab results. He will repeat scans prior to your next visit. He will see you back in 3 weeks for labs, treatment and follow up.   Thank you for choosing [place] at [facility] to provide your oncology and hematology care.  To afford each patient quality time with our provider, please arrive at least 15 minutes before your scheduled appointment time.   If you have a lab appointment with the [facility] please come in thru the  Main Entrance and check in at the main information desk  You need to re-schedule your appointment should you arrive 10 or more minutes late.  We strive to give you quality time with our providers, and arriving late affects you and other patients whose appointments are after yours.  Also, if you no show three or more times for appointments you may be dismissed from the clinic at the providers discretion.     Again, thank you for choosing [facility].  Our hope is that these requests will decrease the amount of time that you wait before being seen by our physicians.       _____________________________________________________________  Should you have questions after your visit to [facility], please contact our office at [phone] between the hours of 8:00 a.m. and 4:30 p.m.  Voicemails left after 4:00 p.m. will not be returned until the following business day.  For prescription refill requests, have your pharmacy contact our office and allow 72 hours.    Cancer Center Support Programs:   > Cancer Support Group  2nd Tuesday of the month 1pm-2pm, Journey Room

## 2020-01-18 IMAGING — XA PICC
1 series · 1 of 1 positions shown · IV contrast (agent unspecified)
Comparison: none

INDICATION: Squamous cell carcinoma node access chemotherapy, SVC syndrome

EXAM:
ULTRASOUND AND FLUOROSCOPIC GUIDED PICC LINE INSERTION
MEDICATIONS:
1% lidocaine local
CONTRAST:  None
FLUOROSCOPY TIME:  Forty-eight seconds (13 mGy)
COMPLICATIONS:
None immediate.
TECHNIQUE: The procedure, risks, benefits, and alternatives were explained to
the patient and informed written consent was obtained. A timeout was
performed prior to the initiation of the procedure.

[Series 1: fl (-) angio · 1 of 1 slices shown]
[im 1/1]
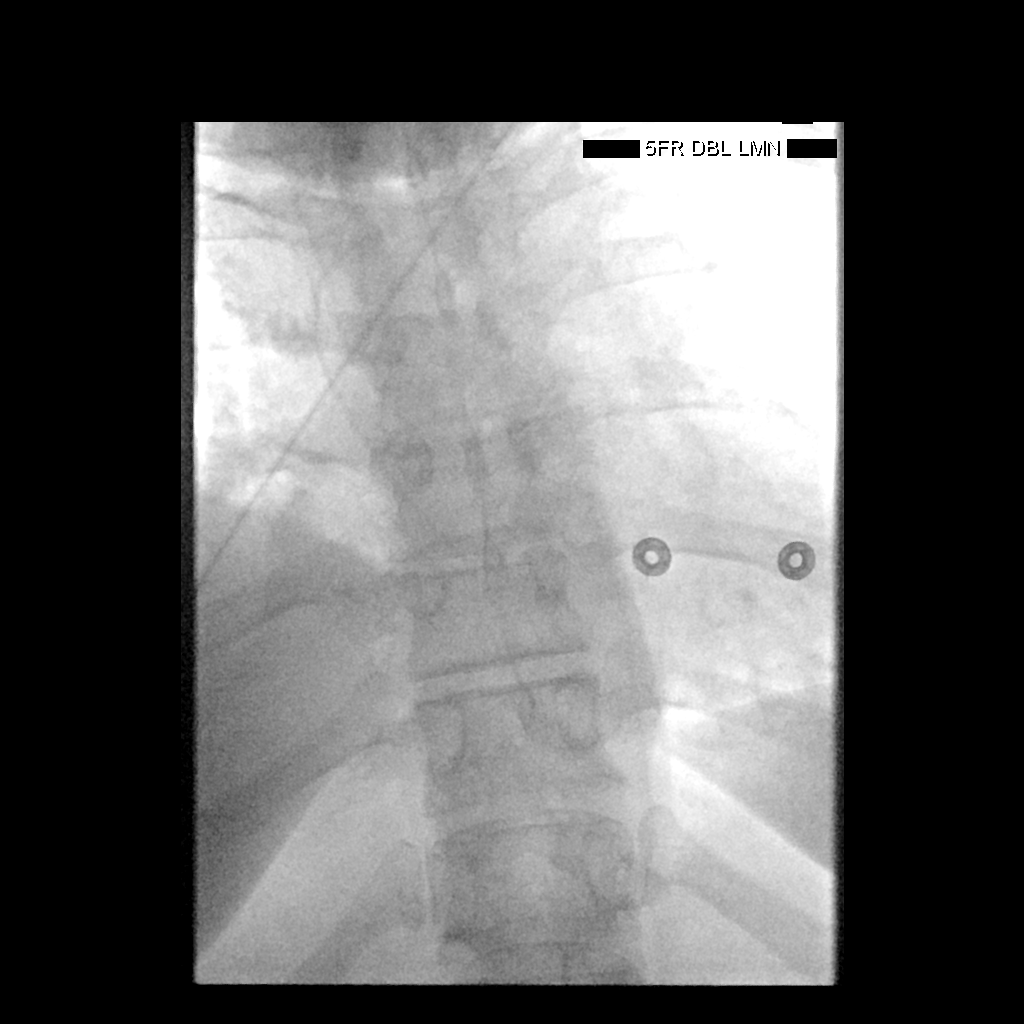

[1 of 1 positions shown; findings below may reference images not displayed]

The left upper extremity was prepped with chlorhexidine in a sterile
fashion, and a sterile drape was applied covering the operative
field. Maximum barrier sterile technique with sterile gowns and
gloves were used for the procedure. A timeout was performed prior to
the initiation of the procedure. Local anesthesia was provided with
1% lidocaine.

Under direct ultrasound guidance, the left basilic vein was accessed
with a micropuncture kit after the overlying soft tissues were
anesthetized with 1% lidocaine. An ultrasound image was saved for
documentation purposes. A guidewire was advanced to the level of the
superior caval-atrial junction for measurement purposes and the PICC
line was cut to length. A peel-away sheath was placed and a 46 cm, 5
French, dual lumen was inserted to level of the superior
caval-atrial junction. A post procedure spot fluoroscopic was
obtained. The catheter easily aspirated and flushed and was sutured
in place. A dressing was placed. The patient tolerated the procedure
well without immediate post procedural complication.
FINDINGS: After catheter placement, the tip lies within the superior
cavoatrial junction. The catheter aspirates and flushes normally and
is ready for immediate use.
IMPRESSION: Successful ultrasound and fluoroscopic guided placement of a left
basilic vein approach, 46 cm, 5 French, dual lumen PICC with tip at
the superior caval-atrial junction. The PICC line is ready for
immediate use.

## 2020-02-01 NOTE — Progress Notes (Signed)

## 2020-02-03 ENCOUNTER — Ambulatory Visit (HOSPITAL_COMMUNITY)
Admission: RE | Admit: 2020-02-03 | Discharge: 2020-02-03 | Disposition: A | Discharge status: Home / Self Care | Payer: BC Managed Care – PPO | Source: Ambulatory Visit | Attending: Hematology | Admitting: Hematology

## 2020-02-03 ENCOUNTER — Other Ambulatory Visit: Payer: Self-pay

## 2020-02-03 DIAGNOSIS — C3490 Malignant neoplasm of unspecified part of unspecified bronchus or lung: Secondary | ICD-10-CM | POA: Diagnosis not present

## 2020-02-03 MED ORDER — GADOBUTROL 1 MMOL/ML IV SOLN
10.0000 mL | Freq: Once | INTRAVENOUS | Status: AC | PRN
  Administered 2020-02-03: 10 mL via INTRAVENOUS

## 2020-02-03 MED ORDER — IOHEXOL 300 MG/ML  SOLN
100.0000 mL | Freq: Once | INTRAMUSCULAR | Status: AC | PRN
  Administered 2020-02-03: 100 mL via INTRAVENOUS

## 2020-02-07 ENCOUNTER — Inpatient Hospital Stay (HOSPITAL_COMMUNITY): Payer: BC Managed Care – PPO

## 2020-02-07 ENCOUNTER — Inpatient Hospital Stay (HOSPITAL_COMMUNITY): Payer: BC Managed Care – PPO | Attending: Hematology

## 2020-02-07 ENCOUNTER — Encounter (HOSPITAL_COMMUNITY): Payer: Self-pay | Admitting: Hematology

## 2020-02-07 ENCOUNTER — Inpatient Hospital Stay (HOSPITAL_COMMUNITY): Payer: BC Managed Care – PPO | Admitting: Hematology

## 2020-02-07 ENCOUNTER — Other Ambulatory Visit: Payer: Self-pay

## 2020-02-07 VITALS — BP 103/42 | HR 102 | Resp 16

## 2020-02-07 DIAGNOSIS — Z79899 Other long term (current) drug therapy: Secondary | ICD-10-CM | POA: Diagnosis not present

## 2020-02-07 DIAGNOSIS — C3490 Malignant neoplasm of unspecified part of unspecified bronchus or lung: Secondary | ICD-10-CM | POA: Insufficient documentation

## 2020-02-07 DIAGNOSIS — G629 Polyneuropathy, unspecified: Secondary | ICD-10-CM | POA: Insufficient documentation

## 2020-02-07 DIAGNOSIS — Z806 Family history of leukemia: Secondary | ICD-10-CM | POA: Insufficient documentation

## 2020-02-07 DIAGNOSIS — C7970 Secondary malignant neoplasm of unspecified adrenal gland: Secondary | ICD-10-CM | POA: Insufficient documentation

## 2020-02-07 DIAGNOSIS — Z8249 Family history of ischemic heart disease and other diseases of the circulatory system: Secondary | ICD-10-CM | POA: Insufficient documentation

## 2020-02-07 DIAGNOSIS — Z801 Family history of malignant neoplasm of trachea, bronchus and lung: Secondary | ICD-10-CM | POA: Diagnosis not present

## 2020-02-07 DIAGNOSIS — C779 Secondary and unspecified malignant neoplasm of lymph node, unspecified: Secondary | ICD-10-CM

## 2020-02-07 DIAGNOSIS — Z7901 Long term (current) use of anticoagulants: Secondary | ICD-10-CM | POA: Insufficient documentation

## 2020-02-07 DIAGNOSIS — Z5112 Encounter for antineoplastic immunotherapy: Secondary | ICD-10-CM | POA: Diagnosis present

## 2020-02-07 DIAGNOSIS — F17210 Nicotine dependence, cigarettes, uncomplicated: Secondary | ICD-10-CM | POA: Diagnosis not present

## 2020-02-07 LAB — CBC WITH DIFFERENTIAL/PLATELET
Abs Immature Granulocytes: 0.03 10*3/uL (ref 0.00–0.07)
Basophils Absolute: 0 10*3/uL (ref 0.0–0.1)
Basophils Relative: 1 %
Eosinophils Absolute: 0.1 10*3/uL (ref 0.0–0.5)
Eosinophils Relative: 1 %
HCT: 45.7 % (ref 36.0–46.0)
Hemoglobin: 14.6 g/dL (ref 12.0–15.0)
Immature Granulocytes: 0 %
Lymphocytes Relative: 12 %
Lymphs Abs: 1 10*3/uL (ref 0.7–4.0)
MCH: 30.3 pg (ref 26.0–34.0)
MCHC: 31.9 g/dL (ref 30.0–36.0)
MCV: 94.8 fL (ref 80.0–100.0)
Monocytes Absolute: 0.5 10*3/uL (ref 0.1–1.0)
Monocytes Relative: 7 %
Neutro Abs: 6.4 10*3/uL (ref 1.7–7.7)
Neutrophils Relative %: 79 %
Platelets: 229 10*3/uL (ref 150–400)
RBC: 4.82 MIL/uL (ref 3.87–5.11)
RDW: 14.4 % (ref 11.5–15.5)
WBC: 8.1 10*3/uL (ref 4.0–10.5)
nRBC: 0 % (ref 0.0–0.2)

## 2020-02-07 LAB — COMPREHENSIVE METABOLIC PANEL
ALT: 20 U/L (ref 0–44)
AST: 16 U/L (ref 15–41)
Albumin: 3.8 g/dL (ref 3.5–5.0)
Alkaline Phosphatase: 73 U/L (ref 38–126)
Anion gap: 8 (ref 5–15)
BUN: 14 mg/dL (ref 6–20)
CO2: 28 mmol/L (ref 22–32)
Calcium: 9.5 mg/dL (ref 8.9–10.3)
Chloride: 102 mmol/L (ref 98–111)
Creatinine, Ser: 0.92 mg/dL (ref 0.44–1.00)
GFR calc Af Amer: >60 mL/min (ref >60)
GFR calc non Af Amer: >60 mL/min (ref >60)
Glucose, Bld: 126 mg/dL — ABNORMAL HIGH (ref 70–99)
Potassium: 3.9 mmol/L (ref 3.5–5.1)
Sodium: 138 mmol/L (ref 135–145)
Total Bilirubin: 0.5 mg/dL (ref 0.3–1.2)
Total Protein: 7.5 g/dL (ref 6.5–8.1)

## 2020-02-07 LAB — TSH: TSH: 6.642 u[IU]/mL — ABNORMAL HIGH (ref 0.350–4.500)

## 2020-02-07 MED ORDER — SODIUM CHLORIDE 0.9 % IV SOLN: Freq: Once | INTRAVENOUS | Status: AC

## 2020-02-07 MED ORDER — SODIUM CHLORIDE 0.9 % IV SOLN
200.0000 mg | Freq: Once | INTRAVENOUS | Status: AC
  Administered 2020-02-07: 200 mg via INTRAVENOUS
  Filled 2020-02-07: qty 8

## 2020-02-07 NOTE — Progress Notes (Signed)
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  BRIEF ONCOLOGIC HISTORY:  Oncology History  Squamous cell lung cancer, unspecified laterality ([place])  04/05/2019 Initial Diagnosis   Squamous cell lung cancer, unspecified laterality ([place])   04/08/2019 -  Chemotherapy   The patient had palonosetron (ALOXI) injection 0.25 mg, 0.25 mg, Intravenous,  Once, 7 of 7 cycles Administration: 0.25 mg (04/08/2019), 0.25 mg (05/02/2019), 0.25 mg (05/23/2019), 0.25 mg (06/13/2019), 0.25 mg (07/05/2019), 0.25 mg (07/26/2019), 0.25 mg (08/16/2019) pegfilgrastim (NEULASTA) injection 6 mg, 6 mg, Subcutaneous, Once, 2 of 2 cycles Administration: 6 mg (04/11/2019), 6 mg (05/04/2019) pegfilgrastim (NEULASTA ONPRO KIT) injection 6 mg, 6 mg, Subcutaneous, Once, 3 of 3 cycles pegfilgrastim-cbqv (UDENYCA) injection 6 mg, 6 mg, Subcutaneous, Once, 5 of 5 cycles Administration: 6 mg (05/25/2019), 6 mg (06/15/2019), 6 mg (07/07/2019), 6 mg (07/28/2019), 6 mg (08/18/2019) CARBOplatin (PARAPLATIN) 900 mg in sodium chloride 0.9 % 500 mL chemo infusion, 900 mg (100 % of original dose 900 mg), Intravenous,  Once, 7 of 7 cycles Dose modification:   (original dose 900 mg, Cycle 1),   (original dose 900 mg, Cycle 2),   (original dose 900 mg, Cycle 3),   (original dose 900 mg, Cycle 4) Administration: 900 mg (04/08/2019), 900 mg (05/02/2019), 900 mg (05/23/2019), 900 mg (06/13/2019), 900 mg (07/05/2019), 900 mg (07/26/2019), 900 mg (08/16/2019) PACLitaxel (TAXOL) 492 mg in sodium chloride 0.9 % 500 mL chemo infusion (> 45m/m2), 200 mg/m2 = 492 mg, Intravenous,  Once, 7 of 7 cycles Dose modification: 175 mg/m2 (original dose 200 mg/m2, Cycle 6, Reason: Other (see comments), Comment: neuropathy)  Administration: 492 mg (04/08/2019), 492 mg (05/02/2019), 492 mg (05/23/2019), 492 mg (06/13/2019), 492 mg (07/05/2019), 432 mg (07/26/2019), 432 mg (08/16/2019) pembrolizumab (KEYTRUDA) 200 mg in sodium chloride 0.9 % 50 mL chemo infusion, 200 mg, Intravenous, Once, 15 of 18 cycles Administration: 200 mg (04/08/2019), 200 mg (07/05/2019), 200 mg (05/02/2019), 200 mg (05/23/2019), 200 mg (06/13/2019), 200 mg (07/26/2019), 200 mg (08/16/2019), 200 mg (09/06/2019), 200 mg (09/27/2019), 200 mg (10/19/2019), 200 mg (11/09/2019), 200 mg (11/30/2019), 200 mg (12/21/2019), 200 mg (01/12/2020), 200 mg (02/07/2020) fosaprepitant (EMEND) 150 mg, dexamethasone (DECADRON) 12 mg in sodium chloride 0.9 % 145 mL IVPB, , Intravenous,  Once, 7 of 7 cycles Administration:  (04/08/2019),  (05/02/2019),  (05/23/2019),  (06/13/2019),  (07/05/2019),  (07/26/2019),  (08/16/2019)  for chemotherapy treatment.        INTERVAL HISTORY:  Ms. GTimothy39y.o. female seen for follow-up of her metastatic lung cancer.  She has restarted Mrs. About a month ago.  Appetite is not percent.  Energy levels are 50%.  Cough and shortness of breath have been stable.  Numbness in the feet is also stable.  Denies new onset pains.  Denies any headaches or vision changes.  REVIEW OF SYSTEMS:  Review of Systems  Respiratory: Positive for cough.   Neurological: Positive for numbness.  All other systems reviewed and are negative.    PAST MEDICAL/SURGICAL HISTORY:  Past Medical History:  Diagnosis Date  . Depression   . Glucose intolerance 03/11/2019  . Obesity, Class III, BMI 40-49.9 (morbid obesity) ([place] 03/11/[place]   Past Surgical History:  Procedure Laterality Date  . CESAREAN SECTION    .  IRRIGATION AND DEBRIDEMENT ABSCESS  03/15/2019   RETROPHARYNGEAL   . TONSILLECTOMY AND ADENOIDECTOMY N/A 03/15/2019   Procedure: IRRIGATION AND DEBRIDEMENT OF RETROPHARYNGEAL ABSCESS;  Surgeon: Leta Baptist, MD;  Location: [facility];  Service: ENT;  Laterality: N/A;     SOCIAL  HISTORY:  Social History   Socioeconomic History  . Marital status: Widowed    Spouse name: Not on file  . Number of children: 1  . Years of education: Not on file  . Highest education level: Not on file  Occupational History  . Not on file  Tobacco Use  . Smoking status: Current Every Day Smoker    Packs/day: 1.00    Years: 25.00    Pack years: 25.00    Types: Cigarettes  . Smokeless tobacco: Never Used  Substance and Sexual Activity  . Alcohol use: Not Currently    Comment: social   . Drug use: Not Currently  . Sexual activity: Not on file  Other Topics Concern  . Not on file  Social History Narrative   2019/03/08   Husband passed away four months ago from Cancer of the [place].   Pt. Has 19 yo son at home.   Social Determinants of Health   Financial Resource Strain: Low Risk   . Difficulty of Paying Living Expenses: Not very hard  Food Insecurity: No Food Insecurity  . Worried About Charity fundraiser in the Last Year: Never true  . Ran Out of Food in the Last Year: Never true  Transportation Needs: No Transportation Needs  . Lack of Transportation (Medical): No  . Lack of Transportation (Non-Medical): No  Physical Activity: Inactive  . Days of Exercise per Week: 0 days  . Minutes of Exercise per Session: 0 min  Stress: No Stress Concern Present  . Feeling of Stress : Only a little  Social Connections: Moderately Isolated  . Frequency of Communication with Friends and Family: More than three times a week  . Frequency of Social Gatherings with Friends and Family: Twice a week  . Attends Religious Services: Never  . Active Member of Clubs or Organizations: No  . Attends Archivist Meetings: Never  . Marital Status: Widowed  Intimate Partner Violence: Not At Risk  . Fear of Current or Ex-Partner: No  . Emotionally Abused: No  . Physically Abused: No  . Sexually Abused: No    FAMILY HISTORY:  Family History  Problem Relation Age of Onset  . Lung  cancer Mother   . Hypertension Mother   . Leukemia Father   . Myelodysplastic syndrome Father     CURRENT MEDICATIONS:  Outpatient Encounter Medications as of 02/07/2020  Medication Sig  . apixaban (ELIQUIS) 5 MG TABS tablet Take 1 tablet (5 mg total) by mouth 2 (two) times daily.  [name] CARBOPLATIN IV Inject into the vein every 21 ( twenty-one) days. X 4 cycles  . gabapentin (NEURONTIN) 300 MG capsule Take 1 capsule (300 mg total) by mouth 3 (three) times daily. Take 300 mg in the morning, 300 mg at noon and 600 mg at bedtime. (Patient taking differently: Take 300 mg by mouth 3 (three) times daily. Take 300 mg in the morning, 300 mg at noon and 600 mg at bedtime.  Per pt, she only takes twice a day)  . PACLitaxel (TAXOL IV) Inject into the vein every 21 ( twenty-one) days. X 4 cycles  . PEMBROLIZUMAB IV Inject into the vein every 21 ( twenty-one) days.  . furosemide (LASIX)  20 MG tablet Take 1 tablet (20 mg total) by mouth once as needed for up to 1 dose (Take 43m once daily as needed). (Patient not taking: Reported on 02/07/2020)  . oxyCODONE (OXY IR/ROXICODONE) 5 MG immediate release tablet Take 1 tablet (5 mg total) by mouth every 4 (four) hours as needed for severe pain. (Patient not taking: Reported on 02/07/2020)  . prochlorperazine (COMPAZINE) 10 MG tablet Take 1 tablet (10 mg total) by mouth every 6 (six) hours as needed (Nausea or vomiting). (Patient not taking: Reported on 09/27/2019)   No facility-administered encounter medications on file as of 02/07/2020.    ALLERGIES:  No Known Allergies   PHYSICAL EXAM:  ECOG Performance status: 1  Vitals:   02/07/20 1133  BP: (!) 99/49  Pulse: (!) 116  Resp: 18  Temp: (!) 97.3 F (36.3 C)  SpO2: 95%   Filed Weights   02/07/20 1133  Weight: 289 lb 11.2 oz (131.4 kg)    Physical Exam Constitutional:      Comments: Facial edema   HENT:     Mouth/Throat:     Mouth: Mucous membranes are moist.     Pharynx: Oropharynx is clear.   Eyes:     Extraocular Movements: Extraocular movements intact.     Conjunctiva/sclera: Conjunctivae normal.  Cardiovascular:     Rate and Rhythm: Normal rate and regular rhythm.     Heart sounds: Normal heart sounds.  Pulmonary:     Effort: Pulmonary effort is normal.     Breath sounds: Normal breath sounds.  Abdominal:     General: Bowel sounds are normal. There is no distension.     Palpations: There is no mass.  Musculoskeletal:        General: Normal range of motion.     Cervical back: Normal range of motion.  Skin:    General: Skin is warm.  Neurological:     General: No focal deficit present.     Mental Status: She is alert and oriented to person, place, and time.  Psychiatric:        Mood and Affect: Mood normal.        Behavior: Behavior normal.        Thought Content: Thought content normal.        Judgment: Judgment normal.      LABORATORY DATA:  I have reviewed the labs as listed.  CBC    Component Value Date/Time   WBC 8.1 02/07/2020 1043   RBC 4.82 02/07/2020 1043   HGB 14.6 02/07/2020 1043   HCT 45.7 02/07/2020 1043   PLT 229 02/07/2020 1043   MCV 94.8 02/07/2020 1043   MCH 30.3 02/07/2020 1043   MCHC 31.9 02/07/2020 1043   RDW 14.4 02/07/2020 1043   LYMPHSABS 1.0 02/07/2020 1043   MONOABS 0.5 02/07/2020 1043   EOSABS 0.1 02/07/2020 1043   BASOSABS 0.0 02/07/2020 1043   CMP Latest Ref Rng & Units 02/07/2020 01/12/2020 12/21/2019  Glucose 70 - 99 mg/dL 126(H) 111(H) 98  BUN 6 - 20 mg/dL _0 Creatinine 0.44 - 1.00 mg/dL 0.92 0.86 0.79  Sodium 135 - 145 mmol/L 138 138 135  Potassium 3.5 - 5.1 mmol/L 3.9 4.1 3.7  Chloride 98 - 111 mmol/L 102 101 100  CO2 22 - 32 mmol/L _1 Calcium 8.9 - 10.3 mg/dL 9.5 9.3 9.2  Total Protein 6.5 - 8.1 g/dL 7.5 7.6 7.6  Total Bilirubin 0.3 - 1.2 mg/dL 0.5 0.5 0.7  Alkaline Phos 38 - 126 U/L 73 67 72  AST 15 - 41 U/L 16 37 19  ALT 0 - 44 U/L 20 65(H) 27   I have independently reviewed scans.    ASSESSMENT & PLAN:   Squamous cell lung cancer, unspecified laterality ([place]) 1.  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 -Maintenance pembrolizumab started on 09/06/2019. -We reviewed results of the MRI of the brain which showed subcentimeter left thyroid lobe nodular enhancement, questionable for metastatic lesion versus subacute infarction. -We reviewed CT CAP from 02/03/2020.  New nodularity in the bilateral upper lungs measuring up to 6 mm.  Ill-defined soft tissue in the medial right supraclavicular region and right paratracheal region stable versus mildly improved.  Stable mild axillary lymphadenopathy right greater than left.  No suspicious findings in the abdomen or pelvis. -We have reviewed her labs.  CBC and LFTs are normal. -I have recommended repeating brain MRI in 2 months and CT scan of the chest in 3 months. -She will proceed with pembrolizumab today.  2.  Peripheral neuropathy: -She has some neuropathy in the toes which is controlled well with gabapentin 300 mg twice daily.  3.  Left basilic vein thrombus: -Ultrasound on 05/03/2019 shows nonocclusive thrombus around the PICC line in the left basilic vein considered superficial thrombus with no evidence of DVT. -Because of her metastatic cancer, we have recommended continuing Eliquis.  She is tolerating it well.  4.  Thyroid abnormality: -TSH today 6.642.  Last visit is was 3.0.  We will continue to closely monitor it.      Derek Jack, MD [place] [phone]

## 2020-02-07 NOTE — Patient Instructions (Signed)
[place] at [facility] Discharge Instructions  You were seen today by Dr. Delton Coombes. He went over your recent lab and scan results. He will see you back in 3 weeks for labs, treatment and follow up.   Thank you for choosing [place] at [facility] to provide your oncology and hematology care.  To afford each patient quality time with our provider, please arrive at least 15 minutes before your scheduled appointment time.   If you have a lab appointment with the [facility] please come in thru the  Main Entrance and check in at the main information desk  You need to re-schedule your appointment should you arrive 10 or more minutes late.  We strive to give you quality time with our providers, and arriving late affects you and other patients whose appointments are after yours.  Also, if you no show three or more times for appointments you may be dismissed from the clinic at the providers discretion.     Again, thank you for choosing [facility].  Our hope is that these requests will decrease the amount of time that you wait before being seen by our physicians.       _____________________________________________________________  Should you have questions after your visit to [facility], please contact our office at [phone] between the hours of 8:00 a.m. and 4:30 p.m.  Voicemails left after 4:00 p.m. will not be returned until the following business day.  For prescription refill requests, have your pharmacy contact our office and allow 72 hours.    Cancer Center Support Programs:   > Cancer Support Group  2nd Tuesday of the month 1pm-2pm, Journey Room

## 2020-02-07 NOTE — Progress Notes (Signed)
Patient has been assessed, vital signs and labs have been reviewed by Dr. Katragadda. ANC, Creatinine, LFTs, and Platelets are within treatment parameters per Dr. Katragadda. The patient is good to proceed with treatment at this time.  

## 2020-02-07 NOTE — Progress Notes (Unsigned)
Labs reviewed with MD today. Proceed as planned per MD.   Treatment given per orders. Patient tolerated it well without problems. Vitals stable and discharged home from clinic ambulatory. Follow up as scheduled.

## 2020-02-07 NOTE — Patient Instructions (Signed)
[facility] Discharge Instructions for Patients Receiving Chemotherapy  Today you received the following chemotherapy agents   To help prevent nausea and vomiting after your treatment, we encourage you to take your nausea medication   If you develop nausea and vomiting that is not controlled by your nausea medication, call the clinic.   BELOW ARE SYMPTOMS THAT SHOULD BE REPORTED IMMEDIATELY:  *FEVER GREATER THAN 100.5 F  *CHILLS WITH OR WITHOUT FEVER  NAUSEA AND VOMITING THAT IS NOT CONTROLLED WITH YOUR NAUSEA MEDICATION  *UNUSUAL SHORTNESS OF BREATH  *UNUSUAL BRUISING OR BLEEDING  TENDERNESS IN MOUTH AND THROAT WITH OR WITHOUT PRESENCE OF ULCERS  *URINARY PROBLEMS  *BOWEL PROBLEMS  UNUSUAL RASH Items with * indicate a potential emergency and should be followed up as soon as possible.  Feel free to call the clinic should you have any questions or concerns. The clinic phone number is [phone].  Please show the CHEMO ALERT CARD at check-in to the Emergency Department and triage nurse.   

## 2020-02-07 NOTE — Assessment & Plan Note (Signed)
1.  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 -Maintenance pembrolizumab started on 09/06/2019. -We reviewed results of the MRI of the brain which showed subcentimeter left thyroid lobe nodular enhancement, questionable for metastatic lesion versus subacute infarction. -We reviewed CT CAP from 02/03/2020.  New nodularity in the bilateral upper lungs measuring up to 6 mm.  Ill-defined soft tissue in the medial right supraclavicular region and right paratracheal region stable versus mildly improved.  Stable mild axillary lymphadenopathy right greater than left.  No suspicious findings in the abdomen or pelvis. -We have reviewed her labs.  CBC and LFTs are normal. -I have recommended repeating brain MRI in 2 months and CT scan of the chest in 3 months. -She will proceed with pembrolizumab today.  2.  Peripheral neuropathy: -She has some neuropathy in the toes which is controlled well with gabapentin 300 mg twice daily.  3.  Left basilic vein thrombus: -Ultrasound on 05/03/2019 shows nonocclusive thrombus around the PICC line in the left basilic vein considered superficial thrombus with no evidence of DVT. -Because of her metastatic cancer, we have recommended continuing Eliquis.  She is tolerating it well.  4.  Thyroid abnormality: -TSH today 6.642.  Last visit is was 3.0.  We will continue to closely monitor it.

## 2020-02-07 NOTE — Progress Notes (Unsigned)
Proceed with elevated HR 116 - runs high all the time.  Henreitta Leber, PharmD

## 2020-02-10 ENCOUNTER — Encounter (HOSPITAL_COMMUNITY): Payer: Self-pay | Admitting: *Deleted

## 2020-02-28 ENCOUNTER — Other Ambulatory Visit: Payer: Self-pay

## 2020-02-28 ENCOUNTER — Encounter (HOSPITAL_COMMUNITY): Payer: Self-pay | Admitting: Hematology

## 2020-02-28 ENCOUNTER — Inpatient Hospital Stay (HOSPITAL_COMMUNITY): Payer: BC Managed Care – PPO | Admitting: Hematology

## 2020-02-28 ENCOUNTER — Inpatient Hospital Stay (HOSPITAL_COMMUNITY): Payer: BC Managed Care – PPO

## 2020-02-28 ENCOUNTER — Inpatient Hospital Stay (HOSPITAL_COMMUNITY): Payer: BC Managed Care – PPO | Attending: Hematology

## 2020-02-28 VITALS — BP 120/86 | HR 106 | Temp 97.3°F | Resp 18

## 2020-02-28 DIAGNOSIS — Z5112 Encounter for antineoplastic immunotherapy: Secondary | ICD-10-CM | POA: Insufficient documentation

## 2020-02-28 DIAGNOSIS — C779 Secondary and unspecified malignant neoplasm of lymph node, unspecified: Secondary | ICD-10-CM

## 2020-02-28 DIAGNOSIS — R946 Abnormal results of thyroid function studies: Secondary | ICD-10-CM | POA: Insufficient documentation

## 2020-02-28 DIAGNOSIS — Z806 Family history of leukemia: Secondary | ICD-10-CM | POA: Insufficient documentation

## 2020-02-28 DIAGNOSIS — C3490 Malignant neoplasm of unspecified part of unspecified bronchus or lung: Secondary | ICD-10-CM

## 2020-02-28 DIAGNOSIS — Z801 Family history of malignant neoplasm of trachea, bronchus and lung: Secondary | ICD-10-CM | POA: Insufficient documentation

## 2020-02-28 DIAGNOSIS — Z808 Family history of malignant neoplasm of other organs or systems: Secondary | ICD-10-CM | POA: Insufficient documentation

## 2020-02-28 DIAGNOSIS — Z86718 Personal history of other venous thrombosis and embolism: Secondary | ICD-10-CM | POA: Diagnosis not present

## 2020-02-28 DIAGNOSIS — Z7901 Long term (current) use of anticoagulants: Secondary | ICD-10-CM | POA: Diagnosis not present

## 2020-02-28 DIAGNOSIS — C7970 Secondary malignant neoplasm of unspecified adrenal gland: Secondary | ICD-10-CM | POA: Insufficient documentation

## 2020-02-28 DIAGNOSIS — G629 Polyneuropathy, unspecified: Secondary | ICD-10-CM | POA: Diagnosis not present

## 2020-02-28 DIAGNOSIS — Z79899 Other long term (current) drug therapy: Secondary | ICD-10-CM | POA: Insufficient documentation

## 2020-02-28 DIAGNOSIS — F17210 Nicotine dependence, cigarettes, uncomplicated: Secondary | ICD-10-CM | POA: Diagnosis not present

## 2020-02-28 LAB — COMPREHENSIVE METABOLIC PANEL
ALT: 26 U/L (ref 0–44)
AST: 18 U/L (ref 15–41)
Albumin: 4 g/dL (ref 3.5–5.0)
Alkaline Phosphatase: 69 U/L (ref 38–126)
Anion gap: 11 (ref 5–15)
BUN: 11 mg/dL (ref 6–20)
CO2: 29 mmol/L (ref 22–32)
Calcium: 9.4 mg/dL (ref 8.9–10.3)
Chloride: 99 mmol/L (ref 98–111)
Creatinine, Ser: 0.89 mg/dL (ref 0.44–1.00)
GFR calc Af Amer: >60 mL/min (ref >60)
GFR calc non Af Amer: >60 mL/min (ref >60)
Glucose, Bld: 114 mg/dL — ABNORMAL HIGH (ref 70–99)
Potassium: 4.8 mmol/L (ref 3.5–5.1)
Sodium: 139 mmol/L (ref 135–145)
Total Bilirubin: 0.7 mg/dL (ref 0.3–1.2)
Total Protein: 7.8 g/dL (ref 6.5–8.1)

## 2020-02-28 LAB — CBC WITH DIFFERENTIAL/PLATELET
Abs Immature Granulocytes: 0.04 10*3/uL (ref 0.00–0.07)
Basophils Absolute: 0.1 10*3/uL (ref 0.0–0.1)
Basophils Relative: 1 %
Eosinophils Absolute: 0.1 10*3/uL (ref 0.0–0.5)
Eosinophils Relative: 1 %
HCT: 47.7 % — ABNORMAL HIGH (ref 36.0–46.0)
Hemoglobin: 14.8 g/dL (ref 12.0–15.0)
Immature Granulocytes: 0 %
Lymphocytes Relative: 14 %
Lymphs Abs: 1.3 10*3/uL (ref 0.7–4.0)
MCH: 29.8 pg (ref 26.0–34.0)
MCHC: 31 g/dL (ref 30.0–36.0)
MCV: 96 fL (ref 80.0–100.0)
Monocytes Absolute: 0.6 10*3/uL (ref 0.1–1.0)
Monocytes Relative: 6 %
Neutro Abs: 7.1 10*3/uL (ref 1.7–7.7)
Neutrophils Relative %: 78 %
Platelets: 253 10*3/uL (ref 150–400)
RBC: 4.97 MIL/uL (ref 3.87–5.11)
RDW: 14.6 % (ref 11.5–15.5)
WBC: 9.1 10*3/uL (ref 4.0–10.5)
nRBC: 0 % (ref 0.0–0.2)

## 2020-02-28 MED ORDER — SODIUM CHLORIDE 0.9 % IV SOLN
200.0000 mg | Freq: Once | INTRAVENOUS | Status: AC
  Administered 2020-02-28: 200 mg via INTRAVENOUS
  Filled 2020-02-28: qty 8

## 2020-02-28 MED ORDER — SODIUM CHLORIDE 0.9 % IV SOLN: Freq: Once | INTRAVENOUS | Status: AC

## 2020-02-28 NOTE — Patient Instructions (Signed)
[place] at [facility] Discharge Instructions  You were seen today by Dr. Delton Coombes. He went over your recent lab and scan results. He will see you back in 3 weeks for labs, treatment and follow up.   Thank you for choosing [place] at [facility] to provide your oncology and hematology care.  To afford each patient quality time with our provider, please arrive at least 15 minutes before your scheduled appointment time.   If you have a lab appointment with the [facility] please come in thru the  Main Entrance and check in at the main information desk  You need to re-schedule your appointment should you arrive 10 or more minutes late.  We strive to give you quality time with our providers, and arriving late affects you and other patients whose appointments are after yours.  Also, if you no show three or more times for appointments you may be dismissed from the clinic at the providers discretion.     Again, thank you for choosing [facility].  Our hope is that these requests will decrease the amount of time that you wait before being seen by our physicians.       _____________________________________________________________  Should you have questions after your visit to [facility], please contact our office at [phone] between the hours of 8:00 a.m. and 4:30 p.m.  Voicemails left after 4:00 p.m. will not be returned until the following business day.  For prescription refill requests, have your pharmacy contact our office and allow 72 hours.    Cancer Center Support Programs:   > Cancer Support Group  2nd Tuesday of the month 1pm-2pm, Journey Room

## 2020-02-28 NOTE — Progress Notes (Signed)
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  PRIOR THERAPY: 1.  7 cycles of carboplatin, paclitaxel and pembrolizumab from 04/08/2019 through 08/16/2019.  NGS Results:   CURRENT THERAPY: Pembrolizumab every 3 weeks.  BRIEF ONCOLOGIC HISTORY:  Oncology History  Squamous cell lung cancer, unspecified laterality ([place])  04/05/2019 Initial Diagnosis   Squamous cell lung cancer, unspecified laterality ([place])   04/08/2019 -  Chemotherapy   The patient had palonosetron (ALOXI) injection 0.25 mg, 0.25 mg, Intravenous,  Once, 7 of 7 cycles Administration: 0.25 mg (04/08/2019), 0.25 mg (05/02/2019), 0.25 mg (05/23/2019), 0.25 mg (06/13/2019), 0.25 mg (07/05/2019), 0.25 mg (07/26/2019), 0.25 mg (08/16/2019) pegfilgrastim (NEULASTA) injection 6 mg, 6 mg, Subcutaneous, Once, 2 of 2 cycles Administration: 6 mg (04/11/2019), 6 mg (05/04/2019) pegfilgrastim (NEULASTA ONPRO KIT) injection 6 mg, 6 mg, Subcutaneous, Once, 3 of 3 cycles pegfilgrastim-cbqv (UDENYCA) injection 6 mg, 6 mg, Subcutaneous, Once, 5 of 5 cycles Administration: 6 mg (05/25/2019), 6 mg (06/15/2019), 6 mg (07/07/2019), 6 mg (07/28/2019), 6 mg (08/18/2019) CARBOplatin (PARAPLATIN) 900 mg in sodium chloride 0.9 % 500 mL chemo infusion, 900 mg (100 % of original dose 900 mg), Intravenous,  Once, 7 of 7 cycles Dose modification:   (original dose 900 mg, Cycle 1),   (original dose 900 mg, Cycle 2),   (original dose 900 mg, Cycle 3),   (original dose 900 mg, Cycle 4) Administration: 900 mg (04/08/2019), 900 mg (05/02/2019), 900 mg (05/23/2019), 900 mg (06/13/2019), 900 mg (07/05/2019), 900 mg (07/26/2019), 900 mg (08/16/2019) PACLitaxel (TAXOL) 492 mg in sodium chloride 0.9 % 500 mL chemo infusion (> '80mg'$ /m2), 200 mg/m2 = 492 mg, Intravenous,   Once, 7 of 7 cycles Dose modification: 175 mg/m2 (original dose 200 mg/m2, Cycle 6, Reason: Other (see comments), Comment: neuropathy) Administration: 492 mg (04/08/2019), 492 mg (05/02/2019), 492 mg (05/23/2019), 492 mg (06/13/2019), 492 mg (07/05/2019), 432 mg (07/26/2019), 432 mg (08/16/2019) pembrolizumab (KEYTRUDA) 200 mg in sodium chloride 0.9 % 50 mL chemo infusion, 200 mg, Intravenous, Once, 16 of 19 cycles Administration: 200 mg (04/08/2019), 200 mg (07/05/2019), 200 mg (05/02/2019), 200 mg (05/23/2019), 200 mg (06/13/2019), 200 mg (07/26/2019), 200 mg (08/16/2019), 200 mg (09/06/2019), 200 mg (09/27/2019), 200 mg (10/19/2019), 200 mg (11/09/2019), 200 mg (11/30/2019), 200 mg (12/21/2019), 200 mg (01/12/2020), 200 mg (02/07/2020) fosaprepitant (EMEND) 150 mg, dexamethasone (DECADRON) 12 mg in sodium chloride 0.9 % 145 mL IVPB, , Intravenous,  Once, 7 of 7 cycles Administration:  (04/08/2019),  (05/02/2019),  (05/23/2019),  (06/13/2019),  (07/05/2019),  (07/26/2019),  (08/16/2019)  for chemotherapy treatment.      CANCER STAGING: Cancer Staging No matching staging information was found for the patient.   INTERVAL HISTORY:  Ms. Speelman 39 y.o. female seen for follow-up of metastatic squamous cell lung cancer.  Reports appetite 75%.  Energy levels are 50%.  Chronic cough with white sputum is stable.  Shortness of breath on exertion is also stable.  Denies any nosebleeds, hematuria or bleeding per rectum.  Numbness in the hands and feet has been stable.  She tells me that she has not grown any axillary hair in the last few months.    REVIEW OF SYSTEMS:  Review of Systems  Respiratory: Positive for cough and shortness of breath.   Neurological: Positive for numbness.  Psychiatric/Behavioral: Positive for depression. The patient is nervous/anxious.   All other systems reviewed and are negative.    PAST MEDICAL/SURGICAL HISTORY:  Past Medical History:  Diagnosis Date  . Depression   . Glucose intolerance  03/11/2019  . Obesity, Class III, BMI 40-49.9 (morbid obesity) ([facility]) 03/11/2019   Past Surgical History:  Procedure Laterality Date  . CESAREAN SECTION    . IRRIGATION AND DEBRIDEMENT ABSCESS  03/15/2019   RETROPHARYNGEAL   . TONSILLECTOMY AND ADENOIDECTOMY N/A 03/15/2019   Procedure: IRRIGATION AND DEBRIDEMENT OF RETROPHARYNGEAL ABSCESS;  Surgeon: Leta Baptist, MD;  Location: [facility];  Service: ENT;  Laterality: N/A;     SOCIAL HISTORY:  Social History   Socioeconomic History  . Marital status: Widowed    Spouse name: Not on file  . Number of children: 1  . Years of education: Not on file  . Highest education level: Not on file  Occupational History  . Not on file  Tobacco Use  . Smoking status: Current Every Day Smoker    Packs/day: 1.00    Years: 25.00    Pack years: 25.00    Types: Cigarettes  . Smokeless tobacco: Never Used  Substance and Sexual Activity  . Alcohol use: Not Currently    Comment: social   . Drug use: Not Currently  . Sexual activity: Not on file  Other Topics Concern  . Not on file  Social History Narrative   2019-03-24   Husband passed away four months ago from Cancer of the [place].   Pt. Has 54 yo son at home.   Social Determinants of Health   Financial Resource Strain: Low Risk   . Difficulty of Paying Living Expenses: Not very hard  Food Insecurity: No Food Insecurity  . Worried About Charity fundraiser in the Last Year: Never true  . Ran Out of Food in the Last Year: Never true  Transportation Needs: No Transportation Needs  . Lack of Transportation (Medical): No  . Lack of Transportation (Non-Medical): No  Physical Activity: Inactive  . Days of Exercise per Week: 0 days  . Minutes of Exercise per Session: 0 min  Stress: No Stress Concern Present  . Feeling of Stress : Only a little  Social Connections: Moderately Isolated  . Frequency of Communication with Friends and Family: More than three times a week  . Frequency of Social Gatherings  with Friends and Family: Twice a week  . Attends Religious Services: Never  . Active Member of Clubs or Organizations: No  . Attends Archivist Meetings: Never  . Marital Status: Widowed  Intimate Partner Violence: Not At Risk  . Fear of Current or Ex-Partner: No  . Emotionally Abused: No  . Physically Abused: No  . Sexually Abused: No    FAMILY HISTORY:  Family History  Problem Relation Age of Onset  . Lung cancer Mother   . Hypertension Mother   . Leukemia Father   . Myelodysplastic syndrome Father     CURRENT MEDICATIONS:  Outpatient Encounter Medications as of 02/28/2020  Medication Sig  . oxyCODONE (OXY IR/ROXICODONE) 5 MG immediate release tablet Take 1 tablet (5 mg total) by mouth every 4 (four) hours as needed for severe pain.  [name] apixaban (ELIQUIS) 5 MG TABS tablet Take 1 tablet (5 mg total) by mouth 2 (two) times daily.  [name] CARBOPLATIN IV Inject into the vein every 21 ( twenty-one) days. X 4 cycles  . furosemide (LASIX) 20 MG tablet Take 1 tablet (  20 mg total) by mouth once as needed for up to 1 dose (Take '20mg'$  once daily as needed). (Patient not taking: Reported on 02/07/2020)  . gabapentin (NEURONTIN) 300 MG capsule Take 1 capsule (300 mg total) by mouth 3 (three) times daily. Take 300 mg in the morning, 300 mg at noon and 600 mg at bedtime. (Patient taking differently: Take 300 mg by mouth 3 (three) times daily. Take 300 mg in the morning, 300 mg at noon and 600 mg at bedtime.  Per pt, she only takes twice a day)  . PACLitaxel (TAXOL IV) Inject into the vein every 21 ( twenty-one) days. X 4 cycles  . PEMBROLIZUMAB IV Inject into the vein every 21 ( twenty-one) days.  . prochlorperazine (COMPAZINE) 10 MG tablet Take 1 tablet (10 mg total) by mouth every 6 (six) hours as needed (Nausea or vomiting). (Patient not taking: Reported on 09/27/2019)   No facility-administered encounter medications on file as of 02/28/2020.    ALLERGIES:  No Known Allergies   PHYSICAL  EXAM:  ECOG Performance status: 1  Vitals:   02/28/20 1250  BP: 114/67  Pulse: (!) 117  Resp: 16  Temp: (!) 96.4 F (35.8 C)  SpO2: 96%   Filed Weights   02/28/20 1250  Weight: 293 lb 14.4 oz (133.3 kg)   Physical Exam Vitals reviewed.  Constitutional:      Appearance: Normal appearance.  Cardiovascular:     Rate and Rhythm: Normal rate and regular rhythm.     Heart sounds: Normal heart sounds.  Pulmonary:     Effort: Pulmonary effort is normal.     Breath sounds: Normal breath sounds.  Skin:    General: Skin is warm.  Neurological:     General: No focal deficit present.     Mental Status: She is alert and oriented to person, place, and time.  Psychiatric:        Mood and Affect: Mood normal.        Behavior: Behavior normal.      LABORATORY DATA:  I have reviewed the labs as listed.  CBC    Component Value Date/Time   WBC 9.1 02/28/2020 1201   RBC 4.97 02/28/2020 1201   HGB 14.8 02/28/2020 1201   HCT 47.7 (H) 02/28/2020 1201   PLT 253 02/28/2020 1201   MCV 96.0 02/28/2020 1201   MCH 29.8 02/28/2020 1201   MCHC 31.0 02/28/2020 1201   RDW 14.6 02/28/2020 1201   LYMPHSABS 1.3 02/28/2020 1201   MONOABS 0.6 02/28/2020 1201   EOSABS 0.1 02/28/2020 1201   BASOSABS 0.1 02/28/2020 1201   CMP Latest Ref Rng & Units 02/28/2020 02/07/2020 01/12/2020  Glucose 70 - 99 mg/dL 114(H) 126(H) 111(H)  BUN 6 - 20 mg/dL '11 14 16  '$ Creatinine 0.44 - 1.00 mg/dL 0.89 0.92 0.86  Sodium 135 - 145 mmol/L 139 138 138  Potassium 3.5 - 5.1 mmol/L 4.8 3.9 4.1  Chloride 98 - 111 mmol/L 99 102 101  CO2 22 - 32 mmol/L '29 28 27  '$ Calcium 8.9 - 10.3 mg/dL 9.4 9.5 9.3  Total Protein 6.5 - 8.1 g/dL 7.8 7.5 7.6  Total Bilirubin 0.3 - 1.2 mg/dL 0.7 0.5 0.5  Alkaline Phos 38 - 126 U/L 69 73 67  AST 15 - 41 U/L 18 16 37  ALT 0 - 44 U/L 26 20 65(H)    DIAGNOSTIC IMAGING:  I have independently reviewed the scans and discussed with the patient.  ASSESSMENT & PLAN:  Squamous cell lung  cancer, unspecified laterality ([facility]) 1.  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 -Maintenance pembrolizumab started on 09/06/2019. -MRI brain from 02/03/2020 showed 4 mm left parietal nodular enhancement.  Differential includes metastatic versus subacute infarction. -CT CAP on 02/03/2020 showed new nodularity in the bilateral upper lungs measuring 6 mm.  Ill-defined soft tissue in the medial right supraclavicular region and right paratracheal region stable versus mildly improved.  Stable mild axillary lymphadenopathy, right greater than left.  No suspicious findings in the abdomen or pelvis. -I have reviewed her CBC and LFTs which are grossly within normal limits.  She will proceed with her treatment today. -We will plan to repeat brain MRI in 6 weeks.  We will repeat CT scan of the chest in 3 months from the last.  2.  Peripheral neuropathy: -Neuropathy in the toes is well controlled with gabapentin 300 mg twice daily.  3.  Left basilar vein thrombus: -Ultrasound on 05/03/2019 showed nonocclusive thrombus around PICC line in the left basilic vein considered superficial thrombus with no evidence of DVT. -Because of her metastatic cancer, she is on Eliquis and is tolerating well.  4.  Thyroid abnormality: -TSH on 02/07/2020 was 6.642.  It was normal prior to that.  We will check TSH at next visit.     Orders placed this encounter:  Orders Placed This Encounter  Procedures  . CBC with Differential  . Comprehensive metabolic panel  . Magnesium  . TSH      Derek Jack, [address] [phone]

## 2020-02-28 NOTE — Patient Instructions (Signed)
[facility] Discharge Instructions for Patients Receiving Chemotherapy  Today you received the following chemotherapy agents   To help prevent nausea and vomiting after your treatment, we encourage you to take your nausea medication   If you develop nausea and vomiting that is not controlled by your nausea medication, call the clinic.   BELOW ARE SYMPTOMS THAT SHOULD BE REPORTED IMMEDIATELY:  *FEVER GREATER THAN 100.5 F  *CHILLS WITH OR WITHOUT FEVER  NAUSEA AND VOMITING THAT IS NOT CONTROLLED WITH YOUR NAUSEA MEDICATION  *UNUSUAL SHORTNESS OF BREATH  *UNUSUAL BRUISING OR BLEEDING  TENDERNESS IN MOUTH AND THROAT WITH OR WITHOUT PRESENCE OF ULCERS  *URINARY PROBLEMS  *BOWEL PROBLEMS  UNUSUAL RASH Items with * indicate a potential emergency and should be followed up as soon as possible.  Feel free to call the clinic should you have any questions or concerns. The clinic phone number is [phone].  Please show the CHEMO ALERT CARD at check-in to the Emergency Department and triage nurse.   

## 2020-02-28 NOTE — Assessment & Plan Note (Addendum)
1.  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 -Maintenance pembrolizumab started on 09/06/2019. -MRI brain from 02/03/2020 showed 4 mm left parietal nodular enhancement.  Differential includes metastatic versus subacute infarction. -CT CAP on 02/03/2020 showed new nodularity in the bilateral upper lungs measuring 6 mm.  Ill-defined soft tissue in the medial right supraclavicular region and right paratracheal region stable versus mildly improved.  Stable mild axillary lymphadenopathy, right greater than left.  No suspicious findings in the abdomen or pelvis. -I have reviewed her CBC and LFTs which are grossly within normal limits.  She will proceed with her treatment today. -We will plan to repeat brain MRI in 6 weeks.  We will repeat CT scan of the chest in 3 months from the last.  2.  Peripheral neuropathy: -Neuropathy in the toes is well controlled with gabapentin 300 mg twice daily.  3.  Left basilar vein thrombus: -Ultrasound on 05/03/2019 showed nonocclusive thrombus around PICC line in the left basilic vein considered superficial thrombus with no evidence of DVT. -Because of her metastatic cancer, she is on Eliquis and is tolerating well.  4.  Thyroid abnormality: -TSH on 02/07/2020 was 6.642.  It was normal prior to that.  We will check TSH at next visit.

## 2020-02-28 NOTE — Progress Notes (Signed)
Labs reviewed with MD today. Proceed with treatment today per MD.  Heart rate 111. Ok to proceed with treatment.  Treatment given per orders. Patient tolerated it well without problems. Vitals stable and discharged home from clinic ambulatory. Follow up as scheduled.

## 2020-03-21 ENCOUNTER — Inpatient Hospital Stay (HOSPITAL_COMMUNITY): Payer: BC Managed Care – PPO

## 2020-03-21 ENCOUNTER — Other Ambulatory Visit: Payer: Self-pay

## 2020-03-21 ENCOUNTER — Inpatient Hospital Stay (HOSPITAL_COMMUNITY): Payer: BC Managed Care – PPO | Admitting: Hematology

## 2020-03-21 ENCOUNTER — Encounter (HOSPITAL_COMMUNITY): Payer: Self-pay | Admitting: Hematology

## 2020-03-21 VITALS — BP 118/78 | HR 114 | Temp 97.8°F | Resp 18

## 2020-03-21 VITALS — BP 128/77 | HR 129 | Temp 96.8°F | Resp 20 | Wt 303.4 lb

## 2020-03-21 DIAGNOSIS — C3490 Malignant neoplasm of unspecified part of unspecified bronchus or lung: Secondary | ICD-10-CM | POA: Diagnosis not present

## 2020-03-21 LAB — CBC WITH DIFFERENTIAL/PLATELET
Abs Immature Granulocytes: 0.06 10*3/uL (ref 0.00–0.07)
Basophils Absolute: 0 10*3/uL (ref 0.0–0.1)
Basophils Relative: 1 %
Eosinophils Absolute: 0.1 10*3/uL (ref 0.0–0.5)
Eosinophils Relative: 1 %
HCT: 45.4 % (ref 36.0–46.0)
Hemoglobin: 14.5 g/dL (ref 12.0–15.0)
Immature Granulocytes: 1 %
Lymphocytes Relative: 14 %
Lymphs Abs: 1.2 10*3/uL (ref 0.7–4.0)
MCH: 30.3 pg (ref 26.0–34.0)
MCHC: 31.9 g/dL (ref 30.0–36.0)
MCV: 95 fL (ref 80.0–100.0)
Monocytes Absolute: 0.3 10*3/uL (ref 0.1–1.0)
Monocytes Relative: 4 %
Neutro Abs: 6.9 10*3/uL (ref 1.7–7.7)
Neutrophils Relative %: 79 %
Platelets: 241 10*3/uL (ref 150–400)
RBC: 4.78 MIL/uL (ref 3.87–5.11)
RDW: 14.1 % (ref 11.5–15.5)
WBC: 8.7 10*3/uL (ref 4.0–10.5)
nRBC: 0 % (ref 0.0–0.2)

## 2020-03-21 LAB — TSH: TSH: 4.135 u[IU]/mL (ref 0.350–4.500)

## 2020-03-21 LAB — COMPREHENSIVE METABOLIC PANEL
ALT: 21 U/L (ref 0–44)
AST: 16 U/L (ref 15–41)
Albumin: 3.6 g/dL (ref 3.5–5.0)
Alkaline Phosphatase: 67 U/L (ref 38–126)
Anion gap: 9 (ref 5–15)
BUN: 11 mg/dL (ref 6–20)
CO2: 31 mmol/L (ref 22–32)
Calcium: 9.3 mg/dL (ref 8.9–10.3)
Chloride: 95 mmol/L — ABNORMAL LOW (ref 98–111)
Creatinine, Ser: 1.05 mg/dL — ABNORMAL HIGH (ref 0.44–1.00)
GFR calc Af Amer: >60 mL/min (ref >60)
GFR calc non Af Amer: >60 mL/min (ref >60)
Glucose, Bld: 173 mg/dL — ABNORMAL HIGH (ref 70–99)
Potassium: 4.5 mmol/L (ref 3.5–5.1)
Sodium: 135 mmol/L (ref 135–145)
Total Bilirubin: 0.8 mg/dL (ref 0.3–1.2)
Total Protein: 7.3 g/dL (ref 6.5–8.1)

## 2020-03-21 LAB — MAGNESIUM: Magnesium: 1.7 mg/dL (ref 1.7–2.4)

## 2020-03-21 MED ORDER — APIXABAN 5 MG PO TABS: 5.0000 mg | ORAL_TABLET | Freq: Two times a day (BID) | ORAL | 2 refills | Status: DC

## 2020-03-21 MED ORDER — SODIUM CHLORIDE 0.9 % IV SOLN: Freq: Once | INTRAVENOUS | Status: AC

## 2020-03-21 MED ORDER — SODIUM CHLORIDE 0.9 % IV SOLN
200.0000 mg | Freq: Once | INTRAVENOUS | Status: AC
  Administered 2020-03-21: 200 mg via INTRAVENOUS
  Filled 2020-03-21: qty 8

## 2020-03-21 MED ORDER — SODIUM CHLORIDE 0.9 % IV SOLN: INTRAVENOUS | Status: DC

## 2020-03-21 NOTE — Progress Notes (Signed)
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  7 cycles of carboplatin, paclitaxel and pembrolizumab from 04/08/2019 through 08/16/2019.  NGS Results: Not done.  CURRENT THERAPY: Pembrolizumab every 3 weeks  BRIEF ONCOLOGIC HISTORY:  Oncology History  Squamous cell lung cancer, unspecified laterality ([place])  04/05/2019 Initial Diagnosis   Squamous cell lung cancer, unspecified laterality ([place])   04/08/2019 -  Chemotherapy   The patient had palonosetron (ALOXI) injection 0.25 mg, 0.25 mg, Intravenous,  Once, 7 of 7 cycles Administration: 0.25 mg (04/08/2019), 0.25 mg (05/02/2019), 0.25 mg (05/23/2019), 0.25 mg (06/13/2019), 0.25 mg (07/05/2019), 0.25 mg (07/26/2019), 0.25 mg (08/16/2019) pegfilgrastim (NEULASTA) injection 6 mg, 6 mg, Subcutaneous, Once, 2 of 2 cycles Administration: 6 mg (04/11/2019), 6 mg (05/04/2019) pegfilgrastim (NEULASTA ONPRO KIT) injection 6 mg, 6 mg, Subcutaneous, Once, 3 of 3 cycles pegfilgrastim-cbqv (UDENYCA) injection 6 mg, 6 mg, Subcutaneous, Once, 5 of 5 cycles Administration: 6 mg (05/25/2019), 6 mg (06/15/2019), 6 mg (07/07/2019), 6 mg (07/28/2019), 6 mg (08/18/2019) CARBOplatin (PARAPLATIN) 900 mg in sodium chloride 0.9 % 500 mL chemo infusion, 900 mg (100 % of original dose 900 mg), Intravenous,  Once, 7 of 7 cycles Dose modification:   (original dose 900 mg, Cycle 1),   (original dose 900 mg, Cycle 2),   (original dose 900 mg, Cycle 3),   (original dose 900 mg, Cycle 4) Administration: 900 mg (04/08/2019), 900 mg (05/02/2019), 900 mg (05/23/2019), 900 mg (06/13/2019), 900 mg (07/05/2019), 900 mg (07/26/2019), 900 mg (08/16/2019) PACLitaxel (TAXOL) 492 mg in sodium chloride 0.9 % 500 mL chemo infusion (> '80mg'$ /m2), 200 mg/m2 = 492 mg,  Intravenous,  Once, 7 of 7 cycles Dose modification: 175 mg/m2 (original dose 200 mg/m2, Cycle 6, Reason: Other (see comments), Comment: neuropathy) Administration: 492 mg (04/08/2019), 492 mg (05/02/2019), 492 mg (05/23/2019), 492 mg (06/13/2019), 492 mg (07/05/2019), 432 mg (07/26/2019), 432 mg (08/16/2019) pembrolizumab (KEYTRUDA) 200 mg in sodium chloride 0.9 % 50 mL chemo infusion, 200 mg, Intravenous, Once, 16 of 19 cycles Administration: 200 mg (04/08/2019), 200 mg (07/05/2019), 200 mg (05/02/2019), 200 mg (05/23/2019), 200 mg (06/13/2019), 200 mg (07/26/2019), 200 mg (08/16/2019), 200 mg (09/06/2019), 200 mg (09/27/2019), 200 mg (10/19/2019), 200 mg (11/09/2019), 200 mg (11/30/2019), 200 mg (12/21/2019), 200 mg (01/12/2020), 200 mg (02/07/2020), 200 mg (02/28/2020) fosaprepitant (EMEND) 150 mg, dexamethasone (DECADRON) 12 mg in sodium chloride 0.9 % 145 mL IVPB, , Intravenous,  Once, 7 of 7 cycles Administration:  (04/08/2019),  (05/02/2019),  (05/23/2019),  (06/13/2019),  (07/05/2019),  (07/26/2019),  (08/16/2019)  for chemotherapy treatment.      CANCER STAGING: Cancer Staging No matching staging information was found for the patient.  INTERVAL HISTORY:  Ms. Shonna Deiter, a 39 y.o. female, returns for routine follow-up and consideration for next cycle of chemotherapy. Shandrika was last seen on 02/28/2020.  Due for cycle #17 of pembrolizumab today.   Overall, she tells me she has been feeling pretty well.   Overall, she feels ready for next cycle of chemo today.    REVIEW OF SYSTEMS:  Review of Systems  Respiratory: Positive for cough and shortness of breath.   Skin: Positive for rash.  Neurological: Positive for numbness (hands inproved, feet stable).  All other systems reviewed and  are negative.   PAST MEDICAL/SURGICAL HISTORY:  Past Medical History:  Diagnosis Date  . Depression   . Glucose intolerance 03/11/2019  . Obesity, Class III, BMI 40-49.9 (morbid obesity) ([place]) 03/11/2019   Past Surgical  History:  Procedure Laterality Date  . CESAREAN SECTION    . IRRIGATION AND DEBRIDEMENT ABSCESS  03/15/2019   RETROPHARYNGEAL   . TONSILLECTOMY AND ADENOIDECTOMY N/A 03/15/2019   Procedure: IRRIGATION AND DEBRIDEMENT OF RETROPHARYNGEAL ABSCESS;  Surgeon: Leta Baptist, MD;  Location: [facility];  Service: ENT;  Laterality: N/A;    SOCIAL HISTORY:  Social History   Socioeconomic History  . Marital status: Widowed    Spouse name: Not on file  . Number of children: 1  . Years of education: Not on file  . Highest education level: Not on file  Occupational History  . Not on file  Tobacco Use  . Smoking status: Current Every Day Smoker    Packs/day: 1.00    Years: 25.00    Pack years: 25.00    Types: Cigarettes  . Smokeless tobacco: Never Used  Substance and Sexual Activity  . Alcohol use: Not Currently    Comment: social   . Drug use: Not Currently  . Sexual activity: Not on file  Other Topics Concern  . Not on file  Social History Narrative   March 31, 2019   Husband passed away four months ago from Cancer of the [place].   Pt. Has 30 yo son at home.   Social Determinants of Health   Financial Resource Strain: Low Risk   . Difficulty of Paying Living Expenses: Not very hard  Food Insecurity: No Food Insecurity  . Worried About Charity fundraiser in the Last Year: Never true  . Ran Out of Food in the Last Year: Never true  Transportation Needs: No Transportation Needs  . Lack of Transportation (Medical): No  . Lack of Transportation (Non-Medical): No  Physical Activity: Inactive  . Days of Exercise per Week: 0 days  . Minutes of Exercise per Session: 0 min  Stress: No Stress Concern Present  . Feeling of Stress : Only a little  Social Connections: Moderately Isolated  . Frequency of Communication with Friends and Family: More than three times a week  . Frequency of Social Gatherings with Friends and Family: Twice a week  . Attends Religious Services: Never  . Active Member of  Clubs or Organizations: No  . Attends Archivist Meetings: Never  . Marital Status: Widowed  Intimate Partner Violence: Not At Risk  . Fear of Current or Ex-Partner: No  . Emotionally Abused: No  . Physically Abused: No  . Sexually Abused: No    FAMILY HISTORY:  Family History  Problem Relation Age of Onset  . Lung cancer Mother   . Hypertension Mother   . Leukemia Father   . Myelodysplastic syndrome Father     CURRENT MEDICATIONS:  Current Outpatient Medications  Medication Sig Dispense Refill  . apixaban (ELIQUIS) 5 MG TABS tablet Take 1 tablet (5 mg total) by mouth 2 (two) times daily. 60 tablet 2  . CARBOPLATIN IV Inject into the vein every 21 ( twenty-one) days. X 4 cycles    . gabapentin (NEURONTIN) 300 MG capsule Take 1 capsule (300 mg total) by mouth 3 (three) times daily. Take 300 mg in the morning, 300 mg at noon and 600 mg at bedtime. (Patient taking differently: Take 300 mg by mouth 3 (three) times daily. Take 300 mg in  the morning, 300 mg at noon and 600 mg at bedtime.  Per pt, she only takes twice a day) 120 capsule 2  . PACLitaxel (TAXOL IV) Inject into the vein every 21 ( twenty-one) days. X 4 cycles    . PEMBROLIZUMAB IV Inject into the vein every 21 ( twenty-one) days.    [name] oxyCODONE (OXY IR/ROXICODONE) 5 MG immediate release tablet Take 1 tablet (5 mg total) by mouth every 4 (four) hours as needed for severe pain. (Patient not taking: Reported on 03/21/2020) 90 tablet 0  . prochlorperazine (COMPAZINE) 10 MG tablet Take 1 tablet (10 mg total) by mouth every 6 (six) hours as needed (Nausea or vomiting). (Patient not taking: Reported on 09/27/2019) 30 tablet 1   No current facility-administered medications for this visit.    ALLERGIES:  No Known Allergies  PHYSICAL EXAM:  Performance status (ECOG): 1 - Symptomatic but completely ambulatory  Vitals:   03/21/20 1242  BP: 128/77  Pulse: (!) 129  Resp: 20  Temp: (!) 96.8 F (36 C)  SpO2: 98%   Wt  Readings from Last 3 Encounters:  03/21/20 (!) 303 lb 6.4 oz (137.6 kg)  02/28/20 293 lb 14.4 oz (133.3 kg)  02/07/20 289 lb 11.2 oz (131.4 kg)   Physical Exam Vitals reviewed.  Constitutional:      Appearance: Normal appearance.  Cardiovascular:     Rate and Rhythm: Normal rate and regular rhythm.     Heart sounds: Normal heart sounds.  Pulmonary:     Effort: Pulmonary effort is normal.     Breath sounds: Normal breath sounds.  Abdominal:     Palpations: Abdomen is soft.  Neurological:     General: No focal deficit present.     Mental Status: She is alert and oriented to person, place, and time.  Psychiatric:        Mood and Affect: Mood normal.        Behavior: Behavior normal.     LABORATORY DATA:  I have reviewed the labs as listed.  CBC Latest Ref Rng & Units 03/21/2020 02/28/2020 02/07/2020  WBC 4.0 - 10.5 K/uL 8.7 9.1 8.1  Hemoglobin 12.0 - 15.0 g/dL 14.5 14.8 14.6  Hematocrit 36.0 - 46.0 % 45.4 47.7(H) 45.7  Platelets 150 - 400 K/uL 241 253 229   CMP Latest Ref Rng & Units 02/28/2020 02/07/2020 01/12/2020  Glucose 70 - 99 mg/dL 114(H) 126(H) 111(H)  BUN 6 - 20 mg/dL '11 14 16  '$ Creatinine 0.44 - 1.00 mg/dL 0.89 0.92 0.86  Sodium 135 - 145 mmol/L 139 138 138  Potassium 3.5 - 5.1 mmol/L 4.8 3.9 4.1  Chloride 98 - 111 mmol/L 99 102 101  CO2 22 - 32 mmol/L '29 28 27  '$ Calcium 8.9 - 10.3 mg/dL 9.4 9.5 9.3  Total Protein 6.5 - 8.1 g/dL 7.8 7.5 7.6  Total Bilirubin 0.3 - 1.2 mg/dL 0.7 0.5 0.5  Alkaline Phos 38 - 126 U/L 69 73 67  AST 15 - 41 U/L 18 16 37  ALT 0 - 44 U/L 26 20 65(H)    DIAGNOSTIC IMAGING:  I have independently reviewed the scans and discussed with the patient.   ASSESSMENT:  Squamous cell lung cancer, unspecified laterality ([place]) 1.  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 -Maintenance pembrolizumab started on 09/06/2019. -MRI brain from  02/03/2020 showed 4 mm left parietal nodular enhancement.  Differential includes metastatic versus  subacute infarction. -CT CAP on 02/03/2020 showed new nodularity in the bilateral upper lungs measuring 6 mm.  Ill-defined soft tissue in the medial right supraclavicular region and right paratracheal region stable versus mildly improved.  Stable mild axillary lymphadenopathy, right greater than left.  No suspicious findings in the abdomen or pelvis.   2.  Peripheral neuropathy: -Neuropathy in the toes is well controlled with gabapentin 300 mg twice daily.  3.  Left basilar vein thrombus: -Ultrasound on 05/03/2019 showed nonocclusive thrombus around PICC line in the left basilic vein considered superficial thrombus with no evidence of DVT. -Because of her metastatic cancer, she is on Eliquis and is tolerating well.  4.  Thyroid abnormality: -TSH on 02/07/2020 was 6.642.  It was normal prior to that.  We will check TSH at next visit.   PLAN:  1.  Metastatic squamous cell carcinoma of the lung: -I have reviewed her labs today.  She will proceed with Keytruda. -She will continue Keytruda in 3 weeks.  I will see her back in 6 weeks with repeat CT scan and brain scan.  2.  Peripheral neuropathy: -Continue gabapentin 300 twice daily.  3.  Left basilic vein thrombus: -Continue Eliquis.  4.  Thyroid abnormality: -Repeat thyroid today was 4.135.  No intervention necessary.    Orders placed this encounter:  No orders of the defined types were placed in this encounter.    Derek Jack, MD [facility] [address] [phone]   I, Jacqualyn Posey, am acting as a scribe for Dr. Sanda Linger.  I, Derek Jack MD, have reviewed the above documentation for accuracy and completeness, and I agree with the above.

## 2020-03-21 NOTE — Patient Instructions (Signed)
[place] at [facility] Discharge Instructions  You were seen today by Dr. Delton Coombes. He went over your recent results. We will be scheduling you for repeat CT and MRI. He will see you back in 6 weeks for labs and follow up.   Thank you for choosing [place] at [facility] to provide your oncology and hematology care.  To afford each patient quality time with our provider, please arrive at least 15 minutes before your scheduled appointment time.   If you have a lab appointment with the [facility] please come in thru the  Main Entrance and check in at the main information desk  You need to re-schedule your appointment should you arrive 10 or more minutes late.  We strive to give you quality time with our providers, and arriving late affects you and other patients whose appointments are after yours.  Also, if you no show three or more times for appointments you may be dismissed from the clinic at the providers discretion.     Again, thank you for choosing [facility].  Our hope is that these requests will decrease the amount of time that you wait before being seen by our physicians.       _____________________________________________________________  Should you have questions after your visit to [facility], please contact our office at [phone] between the hours of 8:00 a.m. and 4:30 p.m.  Voicemails left after 4:00 p.m. will not be returned until the following business day.  For prescription refill requests, have your pharmacy contact our office and allow 72 hours.    Cancer Center Support Programs:   > Cancer Support Group  2nd Tuesday of the month 1pm-2pm, Journey Room

## 2020-03-21 NOTE — Progress Notes (Signed)
Patient has been assessed, vital signs and labs have been reviewed by Dr. Delton Coombes. ANC,  LFTs, and Platelets are within treatment parameters, Creatinine is elevated please give additional 500 mL NS today, per Dr. Delton Coombes. The patient is good to proceed with treatment at this time.

## 2020-03-21 NOTE — Patient Instructions (Signed)
[facility] Discharge Instructions for Patients Receiving Chemotherapy  Today you received the following chemotherapy agents   To help prevent nausea and vomiting after your treatment, we encourage you to take your nausea medication   If you develop nausea and vomiting that is not controlled by your nausea medication, call the clinic.   BELOW ARE SYMPTOMS THAT SHOULD BE REPORTED IMMEDIATELY:  *FEVER GREATER THAN 100.5 F  *CHILLS WITH OR WITHOUT FEVER  NAUSEA AND VOMITING THAT IS NOT CONTROLLED WITH YOUR NAUSEA MEDICATION  *UNUSUAL SHORTNESS OF BREATH  *UNUSUAL BRUISING OR BLEEDING  TENDERNESS IN MOUTH AND THROAT WITH OR WITHOUT PRESENCE OF ULCERS  *URINARY PROBLEMS  *BOWEL PROBLEMS  UNUSUAL RASH Items with * indicate a potential emergency and should be followed up as soon as possible.  Feel free to call the clinic should you have any questions or concerns. The clinic phone number is [phone].  Please show the CHEMO ALERT CARD at check-in to the Emergency Department and triage nurse.   

## 2020-03-21 NOTE — Progress Notes (Signed)
Labs reviewed with MD. Will give additional IVF per orders. Proceed with treatment as planned.   Treatment given per orders. Patient tolerated it well without problems. Vitals stable and discharged home from clinic ambulatory. Follow up as scheduled.

## 2020-03-25 IMAGING — CT CT CHEST WITH CONTRAST
2 of 12 series · 10 of 46 positions shown, 15 images · IV contrast (Isovue)
Comparison: PET-CT 04/04/2019.

CLINICAL DATA: Head and neck squamous cell carcinoma.

EXAM:
CT CHEST WITH CONTRAST
CT ABDOMEN AND PELVIS WITH AND WITHOUT CONTRAST
TECHNIQUE: Multidetector CT imaging of the chest was performed during
intravenous contrast administration. Multidetector CT imaging of the
abdomen and pelvis was performed following the standard protocol
before and during bolus administration of intravenous contrast.
CONTRAST:  150mL OMNIPAQUE IOHEXOL 300 MG/ML  SOLN

[Series 11: cap with · axial · 0.82mm/px · z∈[-640,-140]mm · 8 of 130 slices shown, 13 images]
[im 15/130  soft-tissue]
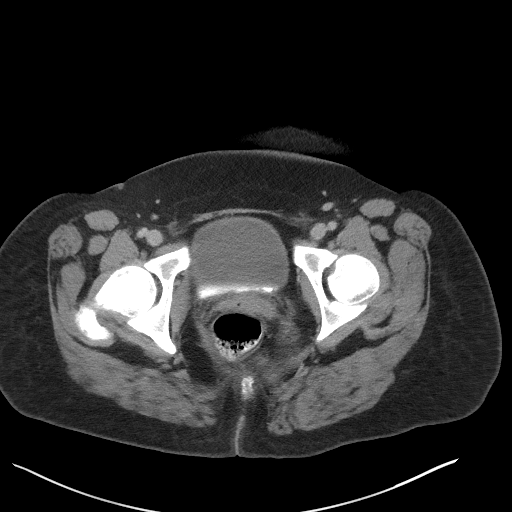
[im 15/130  bone]
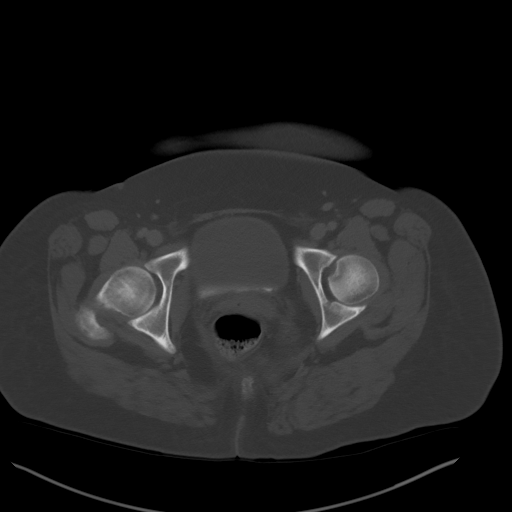
[im 29/130  soft-tissue]
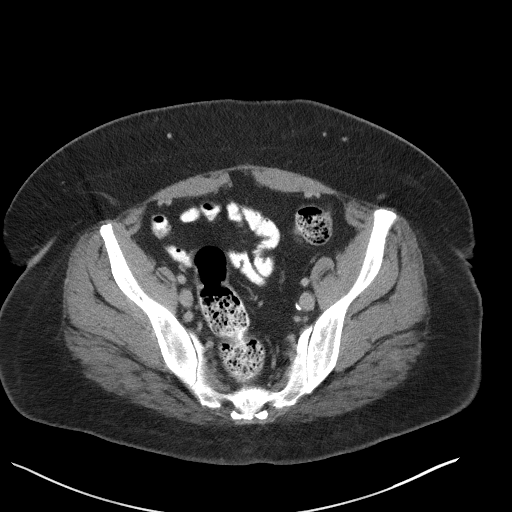
[im 44/130  soft-tissue]
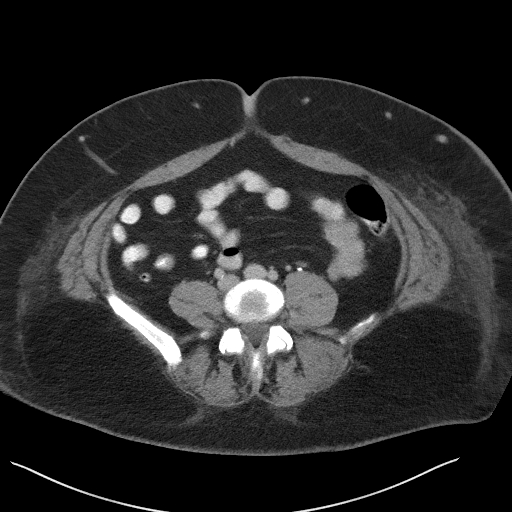
[im 58/130  soft-tissue]
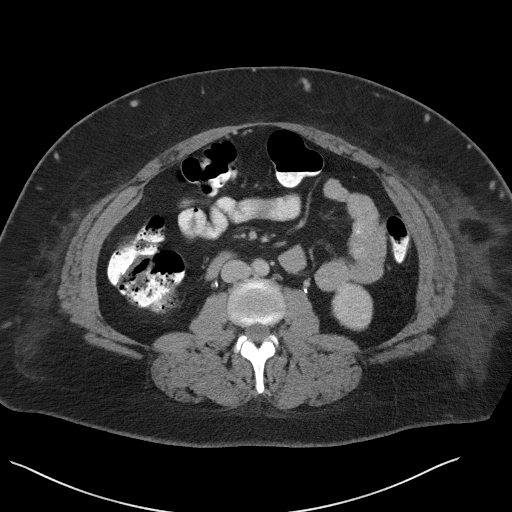
[im 72/130  soft-tissue]
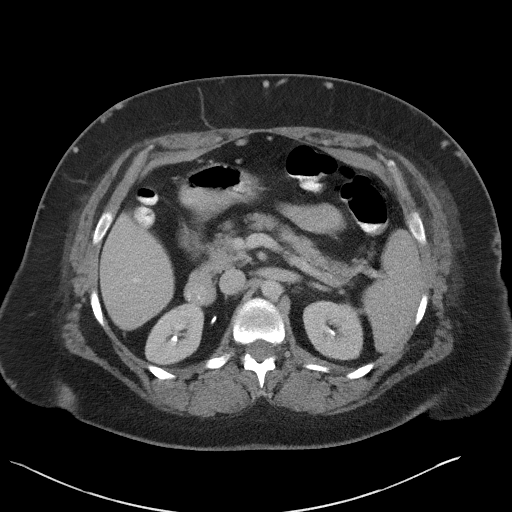
[im 72/130  lung]
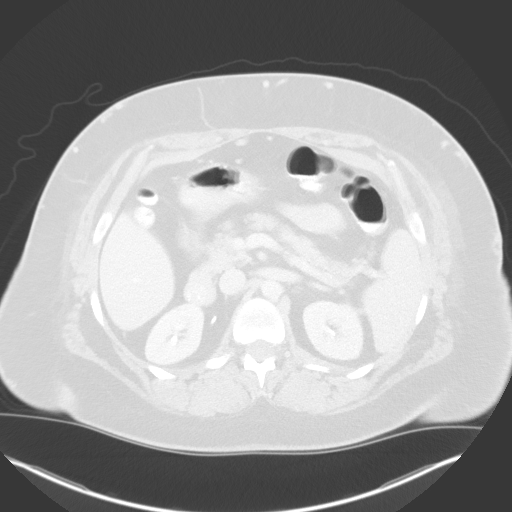
[im 87/130  soft-tissue]
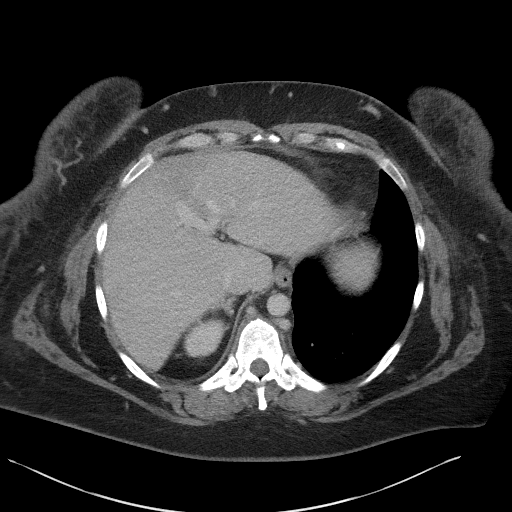
[im 87/130  lung]
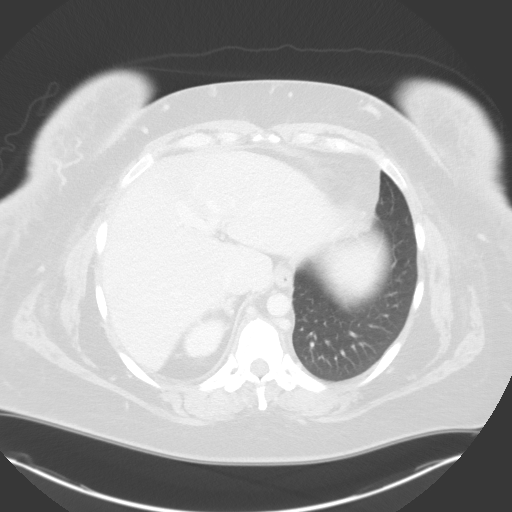
[im 101/130  soft-tissue]
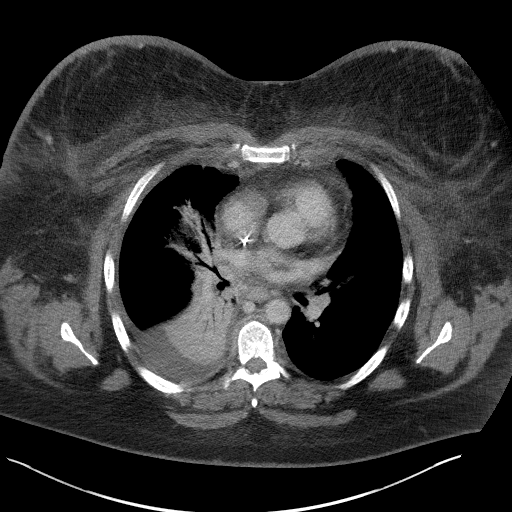
[im 101/130  lung]
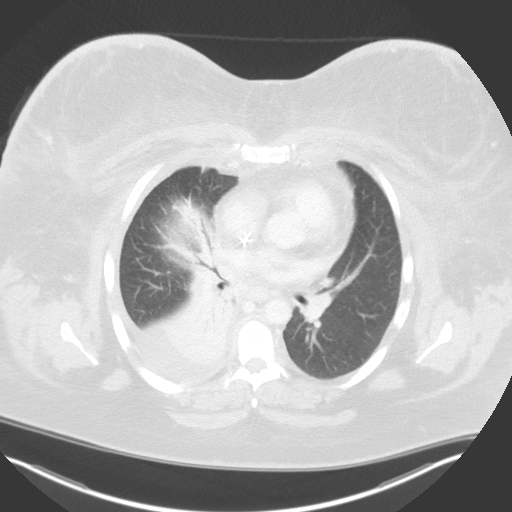
[im 115/130  soft-tissue]
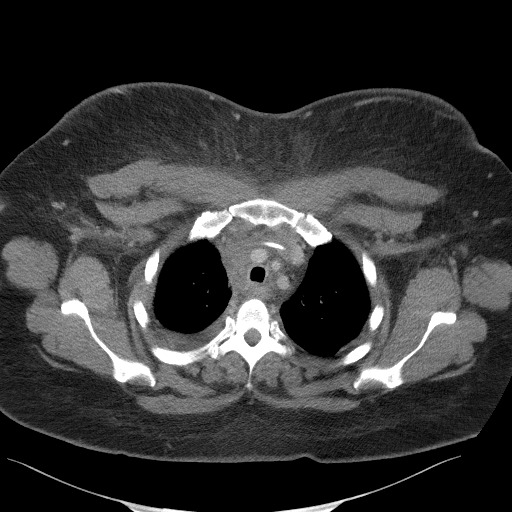
[im 115/130  lung]
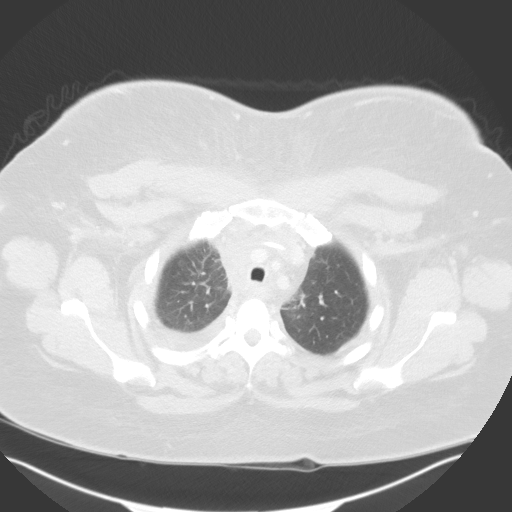

[Series 13: coronals · coronal · 0.82mm/px · 2 of 137 slices shown]
[im 46/137  soft-tissue]
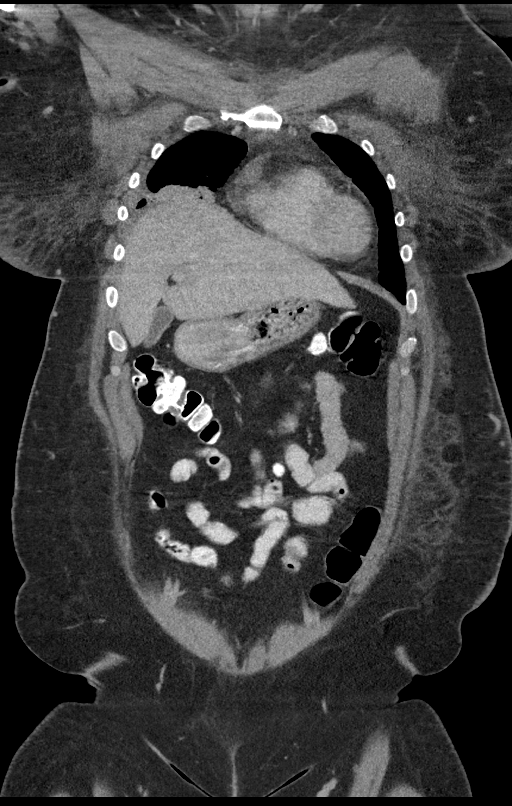
[im 91/137  soft-tissue]
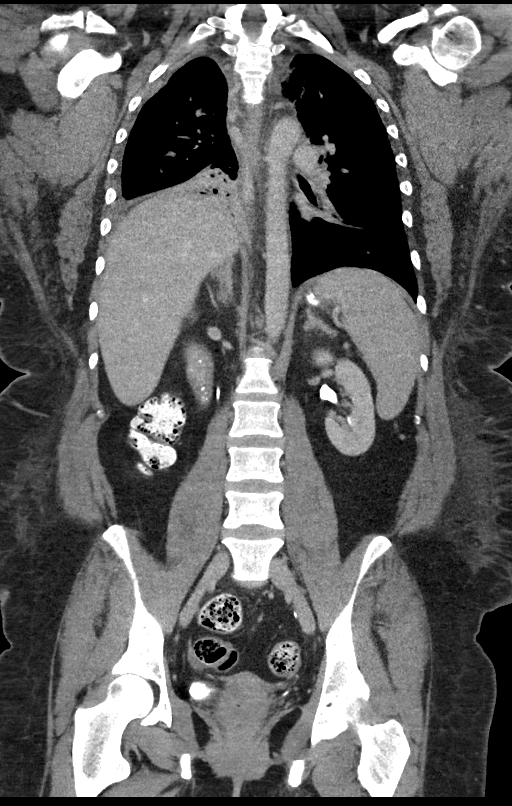

[10 of 46 positions shown; findings below may reference images not displayed]

FINDINGS: CT CHEST FINDINGS

Cardiovascular: The heart size is normal. No substantial pericardial
effusion. No thoracic aortic aneurysm. Left PICC line tip is
positioned at the SVC/RA junction.

Mediastinum/Nodes: Interval decrease in right supraclavicular
node/nodal conglomeration, measuring 3.0 x 2.3 cm today compared to
5.3 x 4.6 cm previously. Ill-defined high right paratracheal nodal
mass is 2.9 x 2.1 cm today compared to 6.4 x 4.5 cm previously.
cm short axis AP window lymph node is similar to prior. There is no
hilar lymphadenopathy. The esophagus has normal imaging features.
1.9 cm short axis right axillary node was 2.0 cm previously. 1.7 cm
short axis lymph node identified left axilla.

Lungs/Pleura: Collapse/consolidative change at the right base is
similar. Small right pleural effusion is slightly progressive in the
interval. Patchy ground-glass attenuation noted medial left upper
lobe, nonspecific. No left pleural effusion.

Musculoskeletal: No worrisome lytic or sclerotic osseous
abnormality.

CT ABDOMEN AND PELVIS FINDINGS

Hepatobiliary: Small area of low attenuation in the anterior liver,
adjacent to the falciform ligament, is in a characteristic location
for focal fatty deposition. No suspicious focal abnormality within
the liver parenchyma. There is no evidence for gallstones,
gallbladder wall thickening, or pericholecystic fluid. No
intrahepatic or extrahepatic biliary dilation.

Pancreas: No focal mass lesion. No dilatation of the main duct. No
intraparenchymal cyst. No peripancreatic edema.

Spleen: No splenomegaly. No focal mass lesion.

Adrenals/Urinary Tract: No adrenal nodule or mass. Both adrenal
glands had hypermetabolic nodules on the prior PET-CT. Kidneys
unremarkable. No evidence for hydroureter. The urinary bladder
appears normal for the degree of distention.

Stomach/Bowel: Stomach is unremarkable. No gastric wall thickening.
No evidence of outlet obstruction. Duodenum is normally positioned
as is the ligament of Treitz. No small bowel wall thickening. No
small bowel dilatation. The terminal ileum is normal. The appendix
is normal. No gross colonic mass. No colonic wall thickening.

Vascular/Lymphatic: No abdominal aortic aneurysm. No abdominal
aortic atherosclerotic calcification. There is no gastrohepatic or
hepatoduodenal ligament lymphadenopathy. No intraperitoneal or
retroperitoneal lymphadenopathy. No pelvic sidewall lymphadenopathy.

Reproductive: The uterus is unremarkable.  There is no adnexal mass.

Other: No intraperitoneal free fluid.

Musculoskeletal: No worrisome lytic or sclerotic osseous
abnormality.
IMPRESSION: 1. The markedly hypermetabolic, bulky nodal disease in the right
supraclavicular space and right thoracic inlet on prior PET-CT has
clearly decreased in the interval.
2. The markedly hypermetabolic bilateral adrenal nodules have
resolved in the interval.
3. Mildly enlarged lymph nodes in both axillary regions are stable.
These lymph nodes showed more low level FDG uptake on the previous
PET-CT and may be reactive. Continued close attention on follow-up
recommended.
4. No new or progressive lymphadenopathy on the current study.
5. Similar right basilar collapse/consolidation with slight
progression of a small right pleural effusion.

## 2020-03-28 ENCOUNTER — Observation Stay (HOSPITAL_COMMUNITY)
Admission: EM | Admit: 2020-03-28 | Discharge: 2020-03-29 | Disposition: A | Discharge status: Home / Self Care | Payer: BC Managed Care – PPO | Attending: Family Medicine | Admitting: Family Medicine

## 2020-03-28 ENCOUNTER — Emergency Department (HOSPITAL_COMMUNITY): Payer: BC Managed Care – PPO

## 2020-03-28 ENCOUNTER — Other Ambulatory Visit: Payer: Self-pay

## 2020-03-28 ENCOUNTER — Encounter (HOSPITAL_COMMUNITY): Payer: Self-pay

## 2020-03-28 DIAGNOSIS — Z515 Encounter for palliative care: Secondary | ICD-10-CM

## 2020-03-28 DIAGNOSIS — C3490 Malignant neoplasm of unspecified part of unspecified bronchus or lung: Principal | ICD-10-CM | POA: Diagnosis present

## 2020-03-28 DIAGNOSIS — Z7901 Long term (current) use of anticoagulants: Secondary | ICD-10-CM | POA: Diagnosis not present

## 2020-03-28 DIAGNOSIS — Z79899 Other long term (current) drug therapy: Secondary | ICD-10-CM | POA: Diagnosis not present

## 2020-03-28 DIAGNOSIS — C771 Secondary and unspecified malignant neoplasm of intrathoracic lymph nodes: Secondary | ICD-10-CM | POA: Diagnosis not present

## 2020-03-28 DIAGNOSIS — Z20822 Contact with and (suspected) exposure to covid-19: Secondary | ICD-10-CM | POA: Insufficient documentation

## 2020-03-28 DIAGNOSIS — Z7189 Other specified counseling: Secondary | ICD-10-CM

## 2020-03-28 DIAGNOSIS — F17210 Nicotine dependence, cigarettes, uncomplicated: Secondary | ICD-10-CM | POA: Diagnosis not present

## 2020-03-28 HISTORY — DX: Malignant (primary) neoplasm, unspecified: C80.1

## 2020-03-28 LAB — CBC WITH DIFFERENTIAL/PLATELET
Abs Immature Granulocytes: 0.06 10*3/uL (ref 0.00–0.07)
Basophils Absolute: 0.1 10*3/uL (ref 0.0–0.1)
Basophils Relative: 1 %
Eosinophils Absolute: 0.1 10*3/uL (ref 0.0–0.5)
Eosinophils Relative: 1 %
HCT: 47 % — ABNORMAL HIGH (ref 36.0–46.0)
Hemoglobin: 15.4 g/dL — ABNORMAL HIGH (ref 12.0–15.0)
Immature Granulocytes: 1 %
Lymphocytes Relative: 10 %
Lymphs Abs: 1.2 10*3/uL (ref 0.7–4.0)
MCH: 31 pg (ref 26.0–34.0)
MCHC: 32.8 g/dL (ref 30.0–36.0)
MCV: 94.8 fL (ref 80.0–100.0)
Monocytes Absolute: 0.7 10*3/uL (ref 0.1–1.0)
Monocytes Relative: 6 %
Neutro Abs: 9.9 10*3/uL — ABNORMAL HIGH (ref 1.7–7.7)
Neutrophils Relative %: 81 %
Platelets: 279 10*3/uL (ref 150–400)
RBC: 4.96 MIL/uL (ref 3.87–5.11)
RDW: 14 % (ref 11.5–15.5)
WBC: 12 10*3/uL — ABNORMAL HIGH (ref 4.0–10.5)
nRBC: 0 % (ref 0.0–0.2)

## 2020-03-28 LAB — COMPREHENSIVE METABOLIC PANEL
ALT: 24 U/L (ref 0–44)
AST: 18 U/L (ref 15–41)
Albumin: 3.7 g/dL (ref 3.5–5.0)
Alkaline Phosphatase: 70 U/L (ref 38–126)
Anion gap: 11 (ref 5–15)
BUN: 16 mg/dL (ref 6–20)
CO2: 26 mmol/L (ref 22–32)
Calcium: 9.4 mg/dL (ref 8.9–10.3)
Chloride: 101 mmol/L (ref 98–111)
Creatinine, Ser: 1.04 mg/dL — ABNORMAL HIGH (ref 0.44–1.00)
GFR calc Af Amer: >60 mL/min (ref >60)
GFR calc non Af Amer: >60 mL/min (ref >60)
Glucose, Bld: 143 mg/dL — ABNORMAL HIGH (ref 70–99)
Potassium: 3.9 mmol/L (ref 3.5–5.1)
Sodium: 138 mmol/L (ref 135–145)
Total Bilirubin: 0.4 mg/dL (ref 0.3–1.2)
Total Protein: 7.8 g/dL (ref 6.5–8.1)

## 2020-03-28 LAB — TROPONIN I (HIGH SENSITIVITY)
Troponin I (High Sensitivity): <2 ng/L (ref <18)
Troponin I (High Sensitivity): <2 ng/L (ref <18)

## 2020-03-28 LAB — SARS CORONAVIRUS 2 BY RT PCR (HOSPITAL ORDER, PERFORMED IN ~~LOC~~ HOSPITAL LAB): SARS Coronavirus 2: NEGATIVE

## 2020-03-28 LAB — D-DIMER, QUANTITATIVE: D-Dimer, Quant: 0.42 ug/mL-FEU (ref 0.00–0.50)

## 2020-03-28 MED ORDER — FOLIC ACID 1 MG PO TABS
1.0000 mg | ORAL_TABLET | Freq: Every day | ORAL | Status: DC
  Administered 2020-03-28 – 2020-03-29 (×2): 1 mg via ORAL
  Filled 2020-03-28 (×2): qty 1

## 2020-03-28 MED ORDER — APIXABAN 5 MG PO TABS
5.0000 mg | ORAL_TABLET | Freq: Once | ORAL | Status: AC
  Administered 2020-03-28: 5 mg via ORAL
  Filled 2020-03-28: qty 1

## 2020-03-28 MED ORDER — ONDANSETRON HCL 4 MG/2ML IJ SOLN: 4.0000 mg | Freq: Four times a day (QID) | INTRAMUSCULAR | Status: DC | PRN

## 2020-03-28 MED ORDER — HYDROMORPHONE HCL 1 MG/ML IJ SOLN
0.5000 mg | Freq: Once | INTRAMUSCULAR | Status: AC
  Administered 2020-03-28: 0.5 mg via INTRAVENOUS
  Filled 2020-03-28: qty 1

## 2020-03-28 MED ORDER — LEVOFLOXACIN IN D5W 500 MG/100ML IV SOLN
500.0000 mg | Freq: Once | INTRAVENOUS | Status: AC
  Administered 2020-03-28: 500 mg via INTRAVENOUS
  Filled 2020-03-28: qty 100

## 2020-03-28 MED ORDER — METHOCARBAMOL 500 MG PO TABS
750.0000 mg | ORAL_TABLET | Freq: Three times a day (TID) | ORAL | Status: DC
  Administered 2020-03-28 – 2020-03-29 (×3): 750 mg via ORAL
  Filled 2020-03-28 (×3): qty 2

## 2020-03-28 MED ORDER — GABAPENTIN 300 MG PO CAPS
300.0000 mg | ORAL_CAPSULE | Freq: Two times a day (BID) | ORAL | Status: DC
  Administered 2020-03-28 – 2020-03-29 (×3): 300 mg via ORAL
  Filled 2020-03-28 (×3): qty 1

## 2020-03-28 MED ORDER — POLYETHYLENE GLYCOL 3350 17 G PO PACK: 17.0000 g | PACK | Freq: Every day | ORAL | Status: DC | PRN

## 2020-03-28 MED ORDER — TRAZODONE HCL 50 MG PO TABS: 50.0000 mg | ORAL_TABLET | Freq: Every evening | ORAL | Status: DC | PRN

## 2020-03-28 MED ORDER — POLYVINYL ALCOHOL 1.4 % OP SOLN
1.0000 [drp] | OPHTHALMIC | Status: DC | PRN
  Filled 2020-03-28: qty 15

## 2020-03-28 MED ORDER — ONDANSETRON HCL 4 MG PO TABS: 4.0000 mg | ORAL_TABLET | Freq: Four times a day (QID) | ORAL | Status: DC | PRN

## 2020-03-28 MED ORDER — OXYCODONE HCL 5 MG PO TABS
5.0000 mg | ORAL_TABLET | ORAL | Status: DC | PRN
  Administered 2020-03-29: 5 mg via ORAL
  Filled 2020-03-28: qty 1

## 2020-03-28 MED ORDER — POLYETHYLENE GLYCOL 3350 17 G PO PACK
17.0000 g | PACK | Freq: Every day | ORAL | Status: DC
  Administered 2020-03-28: 17 g via ORAL
  Filled 2020-03-28 (×2): qty 1

## 2020-03-28 MED ORDER — IOHEXOL 300 MG/ML  SOLN
75.0000 mL | Freq: Once | INTRAMUSCULAR | Status: AC | PRN
  Administered 2020-03-28: 75 mL via INTRAVENOUS

## 2020-03-28 MED ORDER — PROCHLORPERAZINE MALEATE 5 MG PO TABS: 10.0000 mg | ORAL_TABLET | Freq: Four times a day (QID) | ORAL | Status: DC | PRN

## 2020-03-28 MED ORDER — ADULT MULTIVITAMIN W/MINERALS CH
1.0000 | ORAL_TABLET | Freq: Every day | ORAL | Status: DC
  Administered 2020-03-28 – 2020-03-29 (×2): 1 via ORAL
  Filled 2020-03-28 (×2): qty 1

## 2020-03-28 MED ORDER — APIXABAN 5 MG PO TABS
5.0000 mg | ORAL_TABLET | Freq: Two times a day (BID) | ORAL | Status: DC
  Administered 2020-03-29: 5 mg via ORAL
  Filled 2020-03-28: qty 1

## 2020-03-28 MED ORDER — FENTANYL CITRATE (PF) 100 MCG/2ML IJ SOLN: 25.0000 ug | INTRAMUSCULAR | Status: DC | PRN

## 2020-03-28 MED ORDER — SODIUM CHLORIDE 0.9% FLUSH
3.0000 mL | Freq: Two times a day (BID) | INTRAVENOUS | Status: DC
  Administered 2020-03-28 – 2020-03-29 (×2): 3 mL via INTRAVENOUS

## 2020-03-28 MED ORDER — THIAMINE HCL 100 MG PO TABS
100.0000 mg | ORAL_TABLET | Freq: Every day | ORAL | Status: DC
  Administered 2020-03-28 – 2020-03-29 (×2): 100 mg via ORAL
  Filled 2020-03-28 (×2): qty 1

## 2020-03-28 MED ORDER — ALBUTEROL SULFATE (2.5 MG/3ML) 0.083% IN NEBU: 2.5000 mg | INHALATION_SOLUTION | RESPIRATORY_TRACT | Status: DC | PRN

## 2020-03-28 MED ORDER — SODIUM CHLORIDE 0.9 % IV SOLN: 250.0000 mL | INTRAVENOUS | Status: DC | PRN

## 2020-03-28 MED ORDER — SODIUM CHLORIDE 0.9% FLUSH: 3.0000 mL | INTRAVENOUS | Status: DC | PRN

## 2020-03-28 NOTE — ED Notes (Signed)
Pt assisted to bathroom. Pt noted to be moderately dyspneic. Pt noted to have 84% on room air when assisted back to bed. Pt placed on 2 liters for comfort. Pt less dyspneic at rest. 02 saturation 100%. EDP aware and reported pt to be admitted.

## 2020-03-28 NOTE — ED Notes (Signed)
Attempted report x1. 

## 2020-03-28 NOTE — ED Notes (Addendum)
Pt reports itching sensation to right hand/wrist during levaquin infusion. Site flushed and IV remains patent. No rash, redness noted. Hospitalist contacted and reported to slow infusion rate down by half and see how pt tolerates.

## 2020-03-28 NOTE — ED Provider Notes (Signed)
[facility] EMERGENCY DEPARTMENT Provider Note   CSN: [id number] Arrival date & time: 03/28/20  3086     History Chief Complaint  Patient presents with  . Chest Pain    Tara Aguilar is a 39 y.o. female.  Patient complains of left upper back pain.  Patient has a history of lung cancer.  The history is provided by the patient. No language interpreter was used.  Chest Pain Pain location:  L chest Pain quality: aching   Pain radiates to:  Does not radiate Pain severity:  Moderate Onset quality:  Sudden Timing:  Constant Progression:  Worsening Chronicity:  New Context: breathing   Associated symptoms: no abdominal pain, no back pain, no cough, no fatigue and no headache        Past Medical History:  Diagnosis Date  . Cancer ([place])   . Depression   . Glucose intolerance 03/11/2019  . Obesity, Class III, BMI 40-49.9 (morbid obesity) ([place]) 03/11/2019    Patient Active Problem List   Diagnosis Date Noted  . Lung cancer ([place]) 03/28/2020  . 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 Squamous cell lung cancer, unspecified laterality ([place]) 04/05/2019  . Goals of care, counseling/discussion 04/05/2019  . 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 Glucose intolerance 03/11/2019  . Obesity, Class III, BMI 40-49.9 (morbid obesity) ([place]) 03/11/2019  . Tobacco use 03/11/2019  . Dental abscess 03/07/2019  . 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 Depression     Past Surgical History:  Procedure Laterality Date  . CESAREAN SECTION    . IRRIGATION AND DEBRIDEMENT ABSCESS  03/15/2019   RETROPHARYNGEAL   . TONSILLECTOMY AND ADENOIDECTOMY N/A 03/15/2019   Procedure: IRRIGATION AND DEBRIDEMENT OF RETROPHARYNGEAL ABSCESS;  Surgeon: Leta Baptist, MD;  Location: [facility];  Service: ENT;   Laterality: N/A;     OB History   No obstetric history on file.     Family History  Problem Relation Age of Onset  . Lung cancer Mother   . Hypertension Mother   . Leukemia Father   . Myelodysplastic syndrome Father     Social History   Tobacco Use  . Smoking status: Current Some Day Smoker    Packs/day: 1.00    Years: 25.00    Pack years: 25.00    Types: Cigarettes  . Smokeless tobacco: Never Used  Substance Use Topics  . Alcohol use: Not Currently  . Drug use: Not Currently    Home Medications Prior to Admission medications   Medication Sig Start Date End Date Taking? Authorizing Provider  apixaban (ELIQUIS) 5 MG TABS tablet Take 1 tablet (5 mg total) by mouth 2 (two) times daily. 03/21/20  Yes Derek Jack, MD  gabapentin (NEURONTIN) 300 MG capsule Take 1 capsule (300 mg total) by mouth 3 (three) times daily. Take 300 mg in the morning, 300 mg at noon and 600 mg at bedtime. Patient taking differently: Take 300 mg by mouth 2 (two) times daily as needed.  10/19/19  Yes Derek Jack, MD  oxyCODONE (OXY IR/ROXICODONE) 5 MG immediate release tablet Take 1 tablet (5 mg total) by mouth every 4 (four) hours as needed for severe pain. 10/12/19  Yes Lockamy, Randi L, NP-C  PEMBROLIZUMAB IV Inject into the vein every 21 ( twenty-one) days. 04/08/19  Yes [provider]  prochlorperazine (  COMPAZINE) 10 MG tablet Take 1 tablet (10 mg total) by mouth every 6 (six) hours as needed (Nausea or vomiting). 04/06/19  Yes Derek Jack, MD    Allergies    Patient has no known allergies.  Review of Systems   Review of Systems  Constitutional: Negative for appetite change and fatigue.  HENT: Negative for congestion, ear discharge and sinus pressure.   Eyes: Negative for discharge.  Respiratory: Negative for cough.   Cardiovascular: Positive for chest pain.  Gastrointestinal: Negative for abdominal pain and diarrhea.  Genitourinary: Negative for frequency  and hematuria.  Musculoskeletal: Negative for back pain.  Skin: Negative for rash.  Neurological: Negative for seizures and headaches.  Psychiatric/Behavioral: Negative for hallucinations.    Physical Exam Updated Vital Signs BP (!) 119/58   Pulse (!) 116   Temp 98 F (36.7 C) (Oral)   Resp (!) 24   Ht 5\' 6"  (1.676 m)   Wt 136.1 kg   LMP 03/24/2020   SpO2 94%   BMI 48.42 kg/m   Physical Exam Vitals and nursing note reviewed.  Constitutional:      Appearance: She is well-developed.  HENT:     Head: Normocephalic.     Nose: Nose normal.  Eyes:     General: No scleral icterus.    Conjunctiva/sclera: Conjunctivae normal.  Neck:     Thyroid: No thyromegaly.  Cardiovascular:     Rate and Rhythm: Regular rhythm.     Heart sounds: No murmur. No friction rub. No gallop.      Comments: Tachycardic Pulmonary:     Breath sounds: No stridor. No wheezing or rales.  Chest:     Chest wall: No tenderness.  Abdominal:     General: There is no distension.     Tenderness: There is no abdominal tenderness. There is no rebound.  Musculoskeletal:        General: Normal range of motion.     Cervical back: Neck supple.  Lymphadenopathy:     Cervical: No cervical adenopathy.  Skin:    Findings: No erythema or rash.  Neurological:     Mental Status: She is alert and oriented to person, place, and time.     Motor: No abnormal muscle tone.     Coordination: Coordination normal.  Psychiatric:        Behavior: Behavior normal.     ED Results / Procedures / Treatments   Labs (all labs ordered are listed, but only abnormal results are displayed) Labs Reviewed  CBC WITH DIFFERENTIAL/PLATELET - Abnormal; Notable for the following components:      Result Value   WBC 12.0 (*)    Hemoglobin 15.4 (*)    HCT 47.0 (*)    Neutro Abs 9.9 (*)    All other components within normal limits  COMPREHENSIVE METABOLIC PANEL - Abnormal; Notable for the following components:   Glucose, Bld 143 (*)     Creatinine, Ser 1.04 (*)    All other components within normal limits  CULTURE, BLOOD (ROUTINE X 2)  CULTURE, BLOOD (ROUTINE X 2)  SARS CORONAVIRUS 2 BY RT PCR (HOSPITAL ORDER, [facility])  D-DIMER, QUANTITATIVE (NOT AT [facility])  TROPONIN I (HIGH SENSITIVITY)  TROPONIN I (HIGH SENSITIVITY)    EKG None  Radiology CT Chest W Contrast  Result Date: 03/28/2020 CLINICAL DATA:  Chest pain or shortness of breath. History of lung cancer. Left-sided rib pain starting at 4 a.m. EXAM: CT CHEST WITH CONTRAST TECHNIQUE: Multidetector  CT imaging of the chest was performed during intravenous contrast administration. CONTRAST:  45mL OMNIPAQUE IOHEXOL 300 MG/ML  SOLN COMPARISON:  02/03/2020 FINDINGS: Cardiovascular: Normal heart size. No pericardial effusion. Soft tissue density with architectural distortion at the right thoracic inlet and paratracheal mediastinum with chronic venous occlusion and well-formed collaterals. No acute vascular finding. Mediastinum/Nodes: No enlarged or enlarging discrete lymph nodes. Unchanged soft tissue density is noted above. Lungs/Pleura: Micro nodularity at the apices which is more prominent than before. There is bandlike radiation fibrosis about the right hilum. Ground-glass opacity in the posterior costophrenic sulcus on the left. Upper Abdomen: Liver enhancement related to collaterals. Musculoskeletal: No acute or aggressive finding. IMPRESSION: 1. On the symptomatic left side there is subpleural ground-glass opacity at the lower lobe. This could be atelectasis but lung infarct is also considered in this setting. No evidence of rib metastasis/fracture. 2. Bilateral micro nodularity that is progressive from 02/03/2020, increasingly concerning for progressive metastatic disease. 3. Stable soft tissue and architectural distortion at the right thoracic inlet paratracheal mediastinum with venous collaterals. Electronically Signed   By: [name] Fantasia M.D.    On: 03/28/2020 11:38   DG Chest Port 1 View  Result Date: 03/28/2020 CLINICAL DATA:  Shortness of breath. Additional history provided by technologist: Patient reports history of lung cancer, left-sided rib pain which began at 4 a.m. EXAM: PORTABLE CHEST 1 VIEW COMPARISON:  Prior chest CT 02/03/2020, chest radiograph 04/01/2019 FINDINGS: Heart size within normal limits. Chronic elevation of the right hemidiaphragm. Opacity within the medial right upper lobe likely corresponds with previously demonstrated radiation changes. Please refer to prior chest CT 02/03/2020 for a description of nodularity within the bilateral upper lobes as well as ill-defined soft tissue in the medial right supraclavicular region and right paratracheal region. No definite airspace consolidation. No evidence of pleural effusion or pneumothorax. No acute bony abnormality identified. IMPRESSION: Opacity within the medial right upper lobe likely corresponds with previously demonstrated radiation changes. Please refer to prior chest CT 02/03/2020 for a description of nodularity within the bilateral upper lobes as well as ill-defined soft tissue within the medial right supraclavicular region and right paratracheal region. No definite airspace consolidation. Electronically Signed   By: Kellie Simmering DO   On: 03/28/2020 08:53    Procedures Procedures (including critical care time)  Medications Ordered in ED Medications  levofloxacin (LEVAQUIN) IVPB 500 mg (500 mg Intravenous New Bag/Given 03/28/20 1441)  HYDROmorphone (DILAUDID) injection 0.5 mg (0.5 mg Intravenous Given 03/28/20 0751)  iohexol (OMNIPAQUE) 300 MG/ML solution 75 mL (75 mLs Intravenous Contrast Given 03/28/20 1049)  apixaban (ELIQUIS) tablet 5 mg (5 mg Oral Given 03/28/20 1230)    ED Course  I have reviewed the triage vital signs and the nursing notes.  Pertinent labs & imaging results that were available during my care of the patient were reviewed by me and considered in my  medical decision making (see chart for details).    MDM Rules/Calculators/A&P                      Patient with pleuritic chest pain and back pain.  D-dimer normal.  CT scan shows worsening cancer.  I spoke with her oncologist Dr. Delton Coombes and he suggested admission for pain control and antibiotics for possible infection.  Patient will be admitted to medicine      This patient presents to the ED for concern of chest pain this involves an extensive number of treatment options, and is a complaint  that carries with it a high risk of complications and morbidity.  The differential diagnosis includes PE pneumonia worsening cancer   Lab Tests:  I Ordered, reviewed, and interpreted labs, which included CBC chemistries D-dimer.  White count elevated D-dimer normal Medicines ordered:   I ordered medication Dilaudid for pain  Imaging Studies ordered:   I ordered imaging studies which included chest x-ray and CT chest and shows worsening cancer  I independently visualized and interpreted imaging which showed shows worsening cancer  Additional history obtained:   Additional history obtained from records and oncologist  Previous records obtained and reviewed   Consultations Obtained:   I consulted hospitalist and Dr. Delton Coombes oncologist and discussed lab and imaging findings  Reevaluation:  After the interventions stated above, I reevaluated the patient and found mild improvement  Critical Interventions:  .   Final Clinical Impression(s) / ED Diagnoses Final diagnoses:  None    Rx / DC Orders ED Discharge Orders    None       Milton Ferguson, MD 03/28/20 [phone]

## 2020-03-28 NOTE — ED Notes (Signed)
Lab at bedside obtaining blood cultures.

## 2020-03-28 NOTE — H&P (Signed)
Patient Demographics:    Tara Aguilar, is a 39 y.o. female  MRN: [id number]   DOB - 11-14-1980  Admit Date - 03/28/2020  Outpatient Primary MD for the patient is Glenda Chroman, MD   Assessment & Plan:    Principal Problem:   Lung cancer ([place]) Active Problems:   Obesity, Class III, BMI 40-49.9 (morbid obesity) (HCC)   Squamous cell lung cancer, unspecified laterality (HCC)   Goals of care, counseling/discussion    1)Left-sided chest pain--- ???  Due to long cancer progression Vs infection--discussed with Dr. Delton Coombes pain management as well as antibiotics -EDP Did a CT chest with contrast rather than a CTA --Chest imaging suggest subpleural groundglass opacity at the lower lobe and evidence of progressive metastatic disease --Patient is already on Eliquis, D- dimer is Not elevated -P.o. 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 -Maintenance pembrolizumab started on 09/06/2019. -MRI brain from 02/03/2020 showed 4 mm left parietal nodular enhancement. Differential includes metastatic versus subacute infarction. -Currently receiving Keytruda --Ongoing management by oncologist Dr. Delton Coombes  3)Peripheral Neuropathy--- continue gabapentin:  4)Left basilar vein thrombus: -Ultrasound on 05/03/2019 showed nonocclusive thrombus around PICC line in the left basilic vein considered superficial thrombus with no evidence of DVT. -Because of her metastatic cancer, she is on Eliquis and is tolerating well.  5)Thyroid abnormality: -TSH is 4.1.    6)Social/Ethics----husband apparently died recently from cancer of the ampulla -Palliative care consult  for goals of care and advanced directives requested    With History of - Reviewed by me  Past Medical History:  Diagnosis Date  . Cancer ([place])   . Depression   . Glucose intolerance 03/11/2019  . Obesity, Class III, BMI 40-49.9 (morbid obesity) ([place]) 03/11/2019      Past Surgical History:  Procedure Laterality Date  . CESAREAN SECTION    . IRRIGATION AND DEBRIDEMENT ABSCESS  03/15/2019   RETROPHARYNGEAL   . TONSILLECTOMY AND ADENOIDECTOMY N/A 03/15/2019   Procedure: IRRIGATION AND DEBRIDEMENT OF RETROPHARYNGEAL ABSCESS;  Surgeon: Leta Baptist, MD;  Location: [facility];  Service: ENT;  Laterality: N/A;    Chief Complaint  Patient presents with  . Chest Pain      HPI:    Tara Aguilar  is a 39 y.o. 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 Delton Coombes requested admission for pain control and antibiotics for  possible lung infection  -    Review of systems:    In addition to the HPI above,   A full Review of  Systems was done, all other systems reviewed are negative except as noted above in HPI , .    Social History:  Reviewed by me    Social History   Tobacco Use  . Smoking status: Current Some Day Smoker    Packs/day: 1.00    Years: 25.00    Pack years: 25.00    Types: Cigarettes  . Smokeless tobacco: Never Used  Substance Use Topics  . Alcohol use: Not Currently      Family History :  Reviewed by me    Family History  Problem Relation Age of Onset  . Lung  cancer Mother   . Hypertension Mother   . Leukemia Father   . Myelodysplastic syndrome Father      Home Medications:   Prior to Admission medications   Medication Sig Start Date End Date Taking? Authorizing Provider  apixaban (ELIQUIS) 5 MG TABS tablet Take 1 tablet (5 mg total) by mouth 2 (two) times daily. 03/21/20  Yes Derek Jack, MD  gabapentin (NEURONTIN) 300 MG capsule Take 1 capsule (300 mg total) by mouth 3 (three) times daily. Take 300 mg in the morning, 300 mg at noon and 600 mg at bedtime. Patient taking differently: Take 300 mg by mouth 2 (two) times daily as needed.  10/19/19  Yes Derek Jack, MD  oxyCODONE (OXY IR/ROXICODONE) 5 MG immediate release tablet Take 1 tablet (5 mg total) by mouth every 4 (four) hours as needed for severe pain. 10/12/19  Yes Lockamy, Randi L, NP-C  PEMBROLIZUMAB IV Inject into the vein every 21 ( twenty-one) days. 04/08/19  Yes [provider]  prochlorperazine (COMPAZINE) 10 MG tablet Take 1 tablet (10 mg total) by mouth every 6 (six) hours as needed (Nausea or vomiting). 04/06/19  Yes Derek Jack, MD     Allergies:    No Known Allergies   Physical Exam:   Vitals  Blood pressure 108/74, pulse (!) 110, temperature 98.2 F (36.8 C), temperature source Oral, resp. rate 19, height 5\' 6"  (1.676 m), weight 136.1 kg, last menstrual period 03/24/2020, SpO2 100 %.  Physical Examination: General appearance - alert, obese appearing, and in no distress Mental status - alert, oriented to person, place, and time,  Eyes - sclera anicteric Neck - supple, no JVD elevation , Chest -diminished breath sounds, scattered rhonchi and wheezes bilaterally  heart - S1 and S2 normal, regular, tachycardic Abdomen - soft, nontender, nondistended, no masses or organomegaly Neurological - screening mental status exam normal, neck supple without rigidity, cranial nerves II through XII intact, DTR's normal and symmetric Extremities - no  pedal edema noted, intact peripheral pulses  Skin - warm, dry     Data Review:    CBC Recent Labs  Lab 03/28/20 0740  WBC 12.0*  HGB 15.4*  HCT 47.0*  PLT 279  MCV 94.8  MCH 31.0  MCHC 32.8  RDW 14.0  LYMPHSABS 1.2  MONOABS 0.7  EOSABS 0.1  BASOSABS 0.1   ------------------------------------------------------------------------------------------------------------------  Chemistries  Recent Labs  Lab 03/28/20 0740  NA 138  K 3.9  CL 101  CO2 26  GLUCOSE 143*  BUN 16  CREATININE 1.04*  CALCIUM 9.4  AST 18  ALT 24  ALKPHOS 70  BILITOT 0.4   ------------------------------------------------------------------------------------------------------------------ estimated creatinine clearance is 102.2 mL/min (A) (by C-G formula based on SCr  of 1.04 mg/dL (H)). ------------------------------------------------------------------------------------------------------------------ No results for input(s): TSH, T4TOTAL, T3FREE, THYROIDAB in the last 72 hours.  Invalid input(s): FREET3   Coagulation profile No results for input(s): INR, PROTIME in the last 168 hours. ------------------------------------------------------------------------------------------------------------------- Recent Labs    03/28/20 0740  DDIMER 0.42   -------------------------------------------------------------------------------------------------------------------  Cardiac Enzymes No results for input(s): CKMB, TROPONINI, MYOGLOBIN in the last 168 hours.  Invalid input(s): CK ------------------------------------------------------------------------------------------------------------------    Component Value Date/Time   BNP 29.0 03/12/2019 0934     ---------------------------------------------------------------------------------------------------------------  Urinalysis No results found for: COLORURINE, APPEARANCEUR, LABSPEC, PHURINE, GLUCOSEU, HGBUR, BILIRUBINUR, KETONESUR, PROTEINUR,  UROBILINOGEN, NITRITE, LEUKOCYTESUR  ----------------------------------------------------------------------------------------------------------------   Imaging Results:    CT Chest W Contrast  Result Date: 03/28/2020 CLINICAL DATA:  Chest pain or shortness of breath. History of lung cancer. Left-sided rib pain starting at 4 a.m. EXAM: CT CHEST WITH CONTRAST TECHNIQUE: Multidetector CT imaging of the chest was performed during intravenous contrast administration. CONTRAST:  37mL OMNIPAQUE IOHEXOL 300 MG/ML  SOLN COMPARISON:  02/03/2020 FINDINGS: Cardiovascular: Normal heart size. No pericardial effusion. Soft tissue density with architectural distortion at the right thoracic inlet and paratracheal mediastinum with chronic venous occlusion and well-formed collaterals. No acute vascular finding. Mediastinum/Nodes: No enlarged or enlarging discrete lymph nodes. Unchanged soft tissue density is noted above. Lungs/Pleura: Micro nodularity at the apices which is more prominent than before. There is bandlike radiation fibrosis about the right hilum. Ground-glass opacity in the posterior costophrenic sulcus on the left. Upper Abdomen: Liver enhancement related to collaterals. Musculoskeletal: No acute or aggressive finding. IMPRESSION: 1. On the symptomatic left side there is subpleural ground-glass opacity at the lower lobe. This could be atelectasis but lung infarct is also considered in this setting. No evidence of rib metastasis/fracture. 2. Bilateral micro nodularity that is progressive from 02/03/2020, increasingly concerning for progressive metastatic disease. 3. Stable soft tissue and architectural distortion at the right thoracic inlet paratracheal mediastinum with venous collaterals. Electronically Signed   By: [name] Fantasia M.D.   On: 03/28/2020 11:38   DG Chest Port 1 View  Result Date: 03/28/2020 CLINICAL DATA:  Shortness of breath. Additional history provided by technologist: Patient reports history  of lung cancer, left-sided rib pain which began at 4 a.m. EXAM: PORTABLE CHEST 1 VIEW COMPARISON:  Prior chest CT 02/03/2020, chest radiograph 04/01/2019 FINDINGS: Heart size within normal limits. Chronic elevation of the right hemidiaphragm. Opacity within the medial right upper lobe likely corresponds with previously demonstrated radiation changes. Please refer to prior chest CT 02/03/2020 for a description of nodularity within the bilateral upper lobes as well as ill-defined soft tissue in the medial right supraclavicular region and right paratracheal region. No definite airspace consolidation. No evidence of pleural effusion or pneumothorax. No acute bony abnormality identified. IMPRESSION: Opacity within the medial right upper lobe likely corresponds with previously demonstrated radiation changes. Please refer to prior chest CT 02/03/2020 for a description of nodularity within the bilateral upper lobes as well as ill-defined soft tissue within the medial right supraclavicular region and right paratracheal region. No definite airspace consolidation. Electronically Signed   By: Kellie Simmering DO   On: 03/28/2020 08:53    Radiological Exams on Admission: CT Chest W Contrast  Result Date: 03/28/2020 CLINICAL DATA:  Chest pain or shortness of breath. History of lung cancer. Left-sided rib pain starting at 4 a.m. EXAM: CT CHEST WITH CONTRAST TECHNIQUE: Multidetector CT imaging of the chest was performed during intravenous contrast administration. CONTRAST:  19mL OMNIPAQUE IOHEXOL 300 MG/ML  SOLN  COMPARISON:  02/03/2020 FINDINGS: Cardiovascular: Normal heart size. No pericardial effusion. Soft tissue density with architectural distortion at the right thoracic inlet and paratracheal mediastinum with chronic venous occlusion and well-formed collaterals. No acute vascular finding. Mediastinum/Nodes: No enlarged or enlarging discrete lymph nodes. Unchanged soft tissue density is noted above. Lungs/Pleura: Micro  nodularity at the apices which is more prominent than before. There is bandlike radiation fibrosis about the right hilum. Ground-glass opacity in the posterior costophrenic sulcus on the left. Upper Abdomen: Liver enhancement related to collaterals. Musculoskeletal: No acute or aggressive finding. IMPRESSION: 1. On the symptomatic left side there is subpleural ground-glass opacity at the lower lobe. This could be atelectasis but lung infarct is also considered in this setting. No evidence of rib metastasis/fracture. 2. Bilateral micro nodularity that is progressive from 02/03/2020, increasingly concerning for progressive metastatic disease. 3. Stable soft tissue and architectural distortion at the right thoracic inlet paratracheal mediastinum with venous collaterals. Electronically Signed   By: [name] Fantasia M.D.   On: 03/28/2020 11:38   DG Chest Port 1 View  Result Date: 03/28/2020 CLINICAL DATA:  Shortness of breath. Additional history provided by technologist: Patient reports history of lung cancer, left-sided rib pain which began at 4 a.m. EXAM: PORTABLE CHEST 1 VIEW COMPARISON:  Prior chest CT 02/03/2020, chest radiograph 04/01/2019 FINDINGS: Heart size within normal limits. Chronic elevation of the right hemidiaphragm. Opacity within the medial right upper lobe likely corresponds with previously demonstrated radiation changes. Please refer to prior chest CT 02/03/2020 for a description of nodularity within the bilateral upper lobes as well as ill-defined soft tissue in the medial right supraclavicular region and right paratracheal region. No definite airspace consolidation. No evidence of pleural effusion or pneumothorax. No acute bony abnormality identified. IMPRESSION: Opacity within the medial right upper lobe likely corresponds with previously demonstrated radiation changes. Please refer to prior chest CT 02/03/2020 for a description of nodularity within the bilateral upper lobes as well as  ill-defined soft tissue within the medial right supraclavicular region and right paratracheal region. No definite airspace consolidation. Electronically Signed   By: Kellie Simmering DO   On: 03/28/2020 08:53   DVT Prophylaxis -SCD /Eliquis AM Labs Ordered, also please review Full Orders  Family Communication: Admission, patients condition and plan of care including tests being ordered have been discussed with the patient who indicate understanding and agree with the plan   Code Status - Full Code  Likely DC to  home  Condition   Stable  Roxan Hockey M.D on 03/28/2020 at 6:02 PM Go to [email or web address] -  for contact info  [facility] - Office  [phone]

## 2020-03-28 NOTE — ED Triage Notes (Signed)
Pt reports has lung cancer and started having left sided rib pain at 4 am.  Pt restless, grunting.

## 2020-03-28 NOTE — ED Notes (Signed)
Pt placed on 2 liters. Pt room air sat ranging from 88-91%.

## 2020-03-28 NOTE — ED Notes (Signed)
Pt taken to CT at this time.

## 2020-03-29 DIAGNOSIS — Z515 Encounter for palliative care: Secondary | ICD-10-CM

## 2020-03-29 DIAGNOSIS — C3490 Malignant neoplasm of unspecified part of unspecified bronchus or lung: Secondary | ICD-10-CM | POA: Diagnosis not present

## 2020-03-29 DIAGNOSIS — Z7189 Other specified counseling: Secondary | ICD-10-CM | POA: Diagnosis not present

## 2020-03-29 LAB — CBC
HCT: 46.6 % — ABNORMAL HIGH (ref 36.0–46.0)
Hemoglobin: 14.4 g/dL (ref 12.0–15.0)
MCH: 29.9 pg (ref 26.0–34.0)
MCHC: 30.9 g/dL (ref 30.0–36.0)
MCV: 96.9 fL (ref 80.0–100.0)
Platelets: 240 10*3/uL (ref 150–400)
RBC: 4.81 MIL/uL (ref 3.87–5.11)
RDW: 14.1 % (ref 11.5–15.5)
WBC: 9 10*3/uL (ref 4.0–10.5)
nRBC: 0 % (ref 0.0–0.2)

## 2020-03-29 LAB — HIV ANTIBODY (ROUTINE TESTING W REFLEX): HIV Screen 4th Generation wRfx: NONREACTIVE

## 2020-03-29 LAB — BASIC METABOLIC PANEL
Anion gap: 11 (ref 5–15)
BUN: 15 mg/dL (ref 6–20)
CO2: 30 mmol/L (ref 22–32)
Calcium: 9.2 mg/dL (ref 8.9–10.3)
Chloride: 97 mmol/L — ABNORMAL LOW (ref 98–111)
Creatinine, Ser: 1.07 mg/dL — ABNORMAL HIGH (ref 0.44–1.00)
GFR calc Af Amer: >60 mL/min (ref >60)
GFR calc non Af Amer: >60 mL/min (ref >60)
Glucose, Bld: 127 mg/dL — ABNORMAL HIGH (ref 70–99)
Potassium: 4.4 mmol/L (ref 3.5–5.1)
Sodium: 138 mmol/L (ref 135–145)

## 2020-03-29 MED ORDER — APIXABAN 5 MG PO TABS: 5.0000 mg | ORAL_TABLET | Freq: Two times a day (BID) | ORAL | 5 refills | Status: DC

## 2020-03-29 MED ORDER — METHOCARBAMOL 750 MG PO TABS: 750.0000 mg | ORAL_TABLET | Freq: Three times a day (TID) | ORAL | 1 refills | Status: DC

## 2020-03-29 MED ORDER — ALBUTEROL SULFATE HFA 108 (90 BASE) MCG/ACT IN AERS: 2.0000 | INHALATION_SPRAY | Freq: Four times a day (QID) | RESPIRATORY_TRACT | 3 refills | Status: AC | PRN

## 2020-03-29 MED ORDER — ALBUTEROL SULFATE (2.5 MG/3ML) 0.083% IN NEBU: 2.5000 mg | INHALATION_SOLUTION | RESPIRATORY_TRACT | 12 refills | Status: AC | PRN

## 2020-03-29 MED ORDER — COMPRESSOR/NEBULIZER MISC
1.0000 [IU] | 0 refills | Status: DC | PRN
Start: 2020-03-29 — End: 2020-03-29

## 2020-03-29 MED ORDER — SPIRIVA HANDIHALER 18 MCG IN CAPS
18.0000 ug | ORAL_CAPSULE | Freq: Every day | RESPIRATORY_TRACT | 2 refills | Status: DC
Start: 2020-03-29 — End: 2020-06-25

## 2020-03-29 MED ORDER — IBUPROFEN 600 MG PO TABS
600.0000 mg | ORAL_TABLET | Freq: Once | ORAL | Status: AC
  Administered 2020-03-29: 600 mg via ORAL
  Filled 2020-03-29: qty 1

## 2020-03-29 MED ORDER — DOXYCYCLINE HYCLATE 100 MG PO TABS: 100.0000 mg | ORAL_TABLET | Freq: Two times a day (BID) | ORAL | 0 refills | Status: AC

## 2020-03-29 MED ORDER — ADULT MULTIVITAMIN W/MINERALS CH: 1.0000 | ORAL_TABLET | Freq: Every day | ORAL | 2 refills | Status: DC

## 2020-03-29 MED ORDER — PREDNISONE 20 MG PO TABS: 40.0000 mg | ORAL_TABLET | Freq: Every day | ORAL | 0 refills | Status: DC

## 2020-03-29 MED ORDER — COMPRESSOR/NEBULIZER MISC
1.0000 [IU] | 0 refills | Status: DC | PRN
Start: 2020-03-29 — End: 2020-07-12

## 2020-03-29 NOTE — Progress Notes (Deleted)
   03/29/20 0105  Assess: MEWS Score  Temp 98.7 F (37.1 C)  BP 139/87  Pulse Rate (!) 120  SpO2 94 %  O2 Device Room [employer]

## 2020-03-29 NOTE — TOC Initial Note (Signed)
Transition of Care [facility]) - Initial/Assessment Note    Patient Details  Name: Tara Aguilar MRN: [id number] Date of Birth: [birth year]  Transition of [facility]) CM/SW Contact:    Natasha Bence, LCSW Phone Number: 03/29/2020, 10:39 AM  Clinical Narrative:  Patient is a 39 year old female admitted for left-sided chest pain due to long cancer progression. CSW requested O2 from [employer] and notified nurse. Awaiting O2 delivery from [employer] prior to discharge.    Expected Discharge Plan: Home/Self Care Barriers to Discharge: No Barriers Identified   Expected Discharge Plan and Services Expected Discharge Plan: Home/Self Care         Expected Discharge Date: 03/29/20               DME Arranged: Oxygen DME Agency: [employer] Date DME Agency Contacted: 03/29/20 Time DME Agency Contacted: [phone]              Prior Living Arrangements/Services   Lives with:: Self Patient language and need for interpreter reviewed:: Yes        Need for Family Participation in Patient Care: Yes (Comment) Care giver support system in place?: Yes (comment)   Criminal Activity/Legal Involvement Pertinent to Current Situation/Hospitalization: No - Comment as needed  Activities of Daily Living Home Assistive Devices/Equipment: None ADL Screening (condition at time of admission) Patient's cognitive ability adequate to safely complete daily activities?: Yes Is the patient deaf or have difficulty hearing?: Yes Does the patient have difficulty seeing, even when wearing glasses/contacts?: Yes Does the patient have difficulty concentrating, remembering, or making decisions?: No Patient able to express need for assistance with ADLs?: Yes Does the patient have difficulty dressing or bathing?: No Independently performs ADLs?: Yes (appropriate for developmental age) Does the patient have difficulty walking or climbing stairs?: Yes(SOB) Weakness of Legs: None Weakness of Arms/Hands: None  Permission  Sought/Granted   Permission granted to share information with : Yes, Verbal Permission Granted  Share Information with NAME: Blake Divine  Permission granted to share info w AGENCY: [employer]        Emotional Assessment       Orientation: : Oriented to Self, Oriented to Place, Oriented to  Time, Oriented to Situation Alcohol / Substance Use: Not Applicable Psych Involvement: No (comment)  Admission diagnosis:  Lung cancer [facility]) [C34.90] Patient Active Problem List   Diagnosis Date Noted  . Lung cancer ([place]) 03/28/2020  . 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 Squamous cell lung cancer, unspecified laterality ([place]) 04/05/2019  . Goals of care, counseling/discussion 04/05/2019  . 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 Glucose intolerance 03/11/2019  . Obesity, Class III, BMI 40-49.9 (morbid obesity) ([place]) 03/11/2019  . Tobacco use 03/11/2019  . Dental abscess 03/07/2019  . 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 Depression    PCP:  Glenda Chroman, MD Pharmacy:   [place], [place] - [place] [address] [place] [address] [place] [address] Phone: [phone] Fax: [phone]  [facility] [address], [place] [address] Phone: [phone] Fax: [phone]    Readmission Risk Interventions No flowsheet data found.

## 2020-03-29 NOTE — Consult Note (Signed)
Consultation Note Date: 03/29/2020   Patient Name: Tara Aguilar  DOB: [birth year]  MRN: [id number]  Age / Sex: 39 y.o., female  PCP: Glenda Chroman, MD Referring Physician: Roxan Hockey, MD  Reason for Consultation: Establishing goals of care  HPI/Patient Profile: 39 y.o. 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 Delton Coombes, depression admitted on 03/28/2020 with left sided chest pain due to cancer progression vs infection.   Clinical Assessment and Goals of Care: I met today with Tara Aguilar today and no family/friends at bedside. We discussed her progressing disease and she is very aware of her prognosis. However, she also feels that she is hanging in there and has more life left to live. Her husband died months prior to her diagnosis and unfortunately she learned from this experience. She shares that she has made plans for her 71 yo son and her friend will care for him and she has connected him with [facility] with hospice but has been disappointed during [place] this was not as beneficial as she had hoped. She has him signed up for a camp this summer for children with parents that have lost parents or parents with cancer. She is hopeful this will be helpful for him to connect with his peers that are going through similar experiences to his.   We further discussed goals of care and she is hopeful to have as much time with her son as possible. At this time she wishes to be full code but as her quality of life and functional status worsens she would elect DNR. She has had these conversations with her friend, Tara Aguilar, who would act as her surrogate Media planner. I did give her a Advance Directive so that she can complete.   She has no further questions/concerns. Emotional support provided. She has been planning for the future to protect her  son and make difficult decisions for herself and her family.   Primary Decision Maker PATIENT    SUMMARY OF RECOMMENDATIONS   - She has good understanding and taking actions to prepare herself and her son for her poor prognosis.   Code Status/Advance Care Planning:  Full code   Symptom Management:   Per attending  Palliative Prophylaxis:   Frequent Pain Assessment  Additional Recommendations (Limitations, Scope, Preferences):  Full Scope Treatment  Psycho-social/Spiritual:   Desire for further Chaplaincy support:yes  Additional Recommendations: Grief/Bereavement Support  Prognosis:   Overall prognosis poor with advanced lung cancer.   Discharge Planning: Home with Home Health      Primary Diagnoses: Present on Admission: . Lung cancer ([place]) . Obesity, Class III, BMI 40-49.9 (morbid obesity) ([place]) . Squamous cell lung cancer, unspecified laterality ([place])   I have reviewed the medical record, interviewed the patient and family, and examined the patient. The following aspects are pertinent.  Past Medical History:  Diagnosis Date  . Cancer ([place])   . Depression   . Glucose intolerance 03/11/2019  . Obesity,  Class III, BMI 40-49.9 (morbid obesity) ([place]) 03/11/2019   Social History   Socioeconomic History  . Marital status: Widowed    Spouse name: Not on file  . Number of children: 1  . Years of education: Not on file  . Highest education level: Not on file  Occupational History  . Not on file  Tobacco Use  . Smoking status: Current Some Day Smoker    Packs/day: 1.00    Years: 25.00    Pack years: 25.00    Types: Cigarettes  . Smokeless tobacco: Never Used  Substance and Sexual Activity  . Alcohol use: Not Currently  . Drug use: Not Currently  . Sexual activity: Not on file  Other Topics Concern  . Not on file  Social History Narrative   03-14-19   Husband passed away four months ago from Cancer of the [place].   Pt. Has 20 yo son at home.    Social Determinants of Health   Financial Resource Strain:   . Difficulty of Paying Living Expenses:   Food Insecurity:   . Worried About Charity fundraiser in the Last Year:   . Arboriculturist in the Last Year:   Transportation Needs:   . Film/video editor (Medical):   [name] Lack of Transportation (Non-Medical):   Physical Activity:   . Days of Exercise per Week:   . Minutes of Exercise per Session:   Stress:   . Feeling of Stress :   Social Connections:   . Frequency of Communication with Friends and Family:   . Frequency of Social Gatherings with Friends and Family:   . Attends Religious Services:   . Active Member of Clubs or Organizations:   . Attends Archivist Meetings:   [name] Marital Status:    Family History  Problem Relation Age of Onset  . Lung cancer Mother   . Hypertension Mother   . Leukemia Father   . Myelodysplastic syndrome Father    Scheduled Meds: . apixaban  5 mg Oral BID  . folic acid  1 mg Oral Daily  . gabapentin  300 mg Oral BID  . methocarbamol  750 mg Oral TID  . multivitamin with minerals  1 tablet Oral Daily  . polyethylene glycol  17 g Oral Daily  . sodium chloride flush  3 mL Intravenous Q12H  . thiamine  100 mg Oral Daily   Continuous Infusions: . sodium chloride     PRN Meds:.sodium chloride, albuterol, fentaNYL (SUBLIMAZE) injection, ondansetron **OR** ondansetron (ZOFRAN) IV, oxyCODONE, polyethylene glycol, polyvinyl alcohol, prochlorperazine, sodium chloride flush, traZODone No Known Allergies Review of Systems  Constitutional: Positive for activity change, appetite change and fatigue.    Physical Exam Vitals and nursing note reviewed.  Constitutional:      General: She is not in acute distress.    Appearance: She is ill-appearing.  Cardiovascular:     Rate and Rhythm: Tachycardia present.  Pulmonary:     Effort: No tachypnea, accessory muscle usage or respiratory distress.  Abdominal:     Palpations: Abdomen  is soft.  Neurological:     Mental Status: She is alert and oriented to person, place, and time.     Vital Signs: BP 117/78 (BP Location: Right Arm)   Pulse (!) 107   Temp 98.1 F (36.7 C) (Oral)   Resp 20   Ht '5\' 6"'$  (1.676 m)   Wt 136.1 kg   LMP 03/24/2020   SpO2 95%  BMI 48.42 kg/m  Pain Scale: 0-10   Pain Score: 7    SpO2: SpO2: 95 % O2 Device:SpO2: 95 % O2 Flow Rate: .O2 Flow Rate (L/min): 1 L/min  IO: Intake/output summary: No intake or output data in the 24 hours ending 03/29/20 1132  LBM: Last BM Date: 03/27/20 Baseline Weight: Weight: 136.1 kg Most recent weight: Weight: 136.1 kg     Palliative Assessment/Data:     Time In: 1050 Time Out: 1140 Time Total: 50 min Greater than 50%  of this time was spent counseling and coordinating care related to the above assessment and plan.  Signed by: Vinie Sill, NP Palliative Medicine Team Pager # [phone] (M-F 8a-5p) Team Phone # [phone] (Nights/Weekends)

## 2020-03-29 NOTE — Progress Notes (Signed)
   03/29/20 0456  Assess: MEWS Score  Temp 98.1 F (36.7 C)  BP 117/78  Pulse Rate (!) 117  SpO2 96 %  O2 Device Nasal Cannula  Assess: MEWS Score  MEWS Temp 0  MEWS Systolic 0  MEWS Pulse 2  MEWS RR 0  MEWS LOC 0  MEWS Score 2  MEWS Score Color Yellow  Assess: if the MEWS score is Yellow or Red  Were vital signs taken at a resting state? Yes  Focused Assessment Documented focused assessment  Early Detection of Sepsis Score *See Row Information* Low  MEWS guidelines implemented *See Row Information* No, previously yellow, continue vital signs every 4 hours

## 2020-03-29 NOTE — Progress Notes (Signed)
  SATURATION QUALIFICATIONS: (Thisnote is usedto comply with regulatory documentation for home oxygen)  Patient Saturations on Room Air at Rest =88%  Patient Saturations on Room Air while Ambulating =83 %  Patient Saturations on2Liters of oxygen while Ambulating = 93 %   Patient needs continuous O2 at 2 L/min continuously via nasal cannula with humidifier, with gaseous portability and conserving device    -Diagnosis---- COPD and Lung Cancer--  Roxan Hockey, MD

## 2020-03-29 NOTE — TOC Transition Note (Signed)
Transition of Care [facility]) - CM/SW Discharge Note   Patient Details  Name: Tara Aguilar MRN: [id number] Date of Birth: 11-Jul-1981  Transition of Care [facility]) CM/SW Contact:  Natasha Bence, LCSW Phone Number: 03/29/2020, 10:54 AM   Clinical Narrative:   Patient is a 39 year old female admitted for left-sided chest pain due to long cancer progression. Patient will be be discharged with O2 DME provided by [employer] upon delivery.     Final next level of care: Home/Self Care Barriers to Discharge: No Barriers Identified        Discharge Placement  Home                Discharge Plan and Services                DME Arranged: Oxygen DME Agency: [facility] Date DME Agency Contacted: 03/29/20 Time DME Agency Contacted: [id number] Representative spoke with at DME Agency: Blake Divine              Readmission Risk Interventions No flowsheet data found.

## 2020-03-29 NOTE — Discharge Summary (Signed)
Tara Aguilar, is a 39 y.o. female  DOB 12/01/1980  MRN [id number].  Admission date:  03/28/2020  Admitting Physician  Roxan Hockey, MD  Discharge Date:  03/29/2020   Primary MD  Glenda Chroman, MD  Recommendations for primary care physician for things to follow:   1) please take albuterol inhaler and/or nebulizer treatments as needed for shortness of breath cough and wheezing 2) you are taking apixaban/Eliquis for blood thinner so please Avoid ibuprofen/Advil/Aleve/Motrin/Goody Powders/Naproxen/BC powders/Meloxicam/Diclofenac/Indomethacin and other Nonsteroidal anti-inflammatory medications as these will make you more likely to bleed and can cause stomach ulcers, can also cause Kidney problems.  3) please follow-up with your oncologist Dr. Delton Coombes as advised 4)you need oxygen at home at 2 L via nasal cannula continuously while awake and while asleep--- smoking or having open fires around oxygen can cause fire, significant injury and death  Admission Diagnosis  Lung cancer ([place]) [C34.90]   Discharge Diagnosis  Lung cancer ([place]) [C34.90]    Principal Problem:   Lung cancer ([place]) Active Problems:   Obesity, Class III, BMI 40-49.9 (morbid obesity) ([place])   Squamous cell lung cancer, unspecified laterality ([place])   Goals of care, counseling/discussion      Past Medical History:  Diagnosis Date  . Cancer ([place])   . Depression   . Glucose intolerance 03/11/2019  . Obesity, Class III, BMI 40-49.9 (morbid obesity) ([place]) 03/11/2019    Past Surgical History:  Procedure Laterality Date  . CESAREAN SECTION    . IRRIGATION AND DEBRIDEMENT ABSCESS  03/15/2019   RETROPHARYNGEAL   . TONSILLECTOMY AND ADENOIDECTOMY N/A 03/15/2019   Procedure: IRRIGATION AND DEBRIDEMENT OF RETROPHARYNGEAL ABSCESS;  Surgeon: Leta Baptist, MD;  Location: [facility] OR;  Service: ENT;  Laterality: N/A;     HPI  from the history and physical done  on the day of admission:      Tara Aguilar  is a 39 y.o. 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 Delton Coombes requested admission for pain control and antibiotics for possible lung infection     Hospital Course:    1)Left-sided chest pain--- ???  Due to lUng cancer progression Vs infection Versus Pulm Embolism-- discussed with Dr. 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 -Maintenance pembrolizumab started on 09/06/2019. -MRI brain from 02/03/2020 showed 4 mm left parietal nodular enhancement. Differential includes metastatic versus subacute infarction. -Currently receiving Keytruda --Ongoing management by oncologist Dr. Delton Coombes  3)Peripheral Neuropathy--- continue gabapentin:  4)Left basilar vein thrombus: -Ultrasound on 05/03/2019 showed nonocclusive thrombus around PICC line in the left basilic vein  considered superficial thrombus with no evidence of DVT. -Because of her metastatic cancer, she is on Eliquis and is tolerating well. ---Patient is already on Eliquis, she apparently missed couple days of Eliquis due to heavy menstrual flow - D- dimer is Not elevated  5)Thyroid abnormality: -TSH is 4.1.    6)Social/Ethics----husband apparently died recently from cancer of the ampulla -Palliative care consult for goals of care and advanced directives appreciated -Patient has a 14 year old son at home  7) acute hypoxic respiratory failure--suspect due to COPD and underlying lung cancer -Discharge home with home O2 -Bronchodilators and prednisone and doxycycline as prescribed  Discharge Condition: stable  Follow UP  Follow-up Information    [facility] Follow up.   Why: Home Oxygen       Derek Jack, MD. Schedule an appointment as soon as possible for a visit in 1 week(s).   Specialty: Hematology Contact information: [place] [address] [phone]            Consults obtained - palliative care  Diet and Activity recommendation:  As advised  Discharge Instructions     Discharge Instructions    Call MD for:  difficulty breathing, headache or visual disturbances   Complete by: As directed    Call MD for:  persistant dizziness or light-headedness   Complete by: As directed    Call MD for:  persistant nausea and vomiting   Complete by: As directed    Call MD for:  severe uncontrolled pain   Complete by: As directed    Call MD for:  temperature >100.4   Complete by: As directed    Diet - low sodium heart healthy   Complete by: As directed    Discharge instructions   Complete by: As directed    1) please take albuterol inhaler and/or nebulizer treatments as needed for shortness of breath cough and wheezing 2) you are taking apixaban/Eliquis for blood thinner so please Avoid ibuprofen/Advil/Aleve/Motrin/Goody Powders/Naproxen/BC  powders/Meloxicam/Diclofenac/Indomethacin and other Nonsteroidal anti-inflammatory medications as these will make you more likely to bleed and can cause stomach ulcers, can also cause Kidney problems.  3) please follow-up with your oncologist Dr. Delton Coombes as advised 4)you need oxygen at home at 2 L via nasal cannula continuously while awake and while asleep--- smoking or having open fires around oxygen can cause fire, significant injury and death   For home use only DME Nebulizer machine   Complete by: As directed    Patient needs a nebulizer to treat with the following condition: Dyspnea   Length of Need: Lifetime   Increase activity slowly   Complete by: As directed         Discharge Medications     Allergies as of 03/29/2020   No Known Allergies     Medication List    STOP taking these medications   PEMBROLIZUMAB IV     TAKE these medications   albuterol (2.5 MG/3ML) 0.083% nebulizer solution Commonly known as: PROVENTIL Take 3 mLs (  2.5 mg total) by nebulization every 2 (two) hours as needed for wheezing.   albuterol 108 (90 Base) MCG/ACT inhaler Commonly known as: VENTOLIN HFA Inhale 2 puffs into the lungs every 6 (six) hours as needed for wheezing or shortness of breath.   apixaban 5 MG Tabs tablet Commonly known as: Eliquis Take 1 tablet (5 mg total) by mouth 2 (two) times daily.   Compressor/Nebulizer Misc 1 Units by Does not apply route as needed. Nebulizer treatments as needed for shortness of breath cough and wheezing   doxycycline 100 MG tablet Commonly known as: VIBRA-TABS Take 1 tablet (100 mg total) by mouth 2 (two) times daily for 7 days.   gabapentin 300 MG capsule Commonly known as: NEURONTIN Take 1 capsule (300 mg total) by mouth 3 (three) times daily. Take 300 mg in the morning, 300 mg at noon and 600 mg at bedtime. What changed:   when to take this  reasons to take this  additional instructions   methocarbamol 750 MG tablet Commonly known  as: ROBAXIN Take 1 tablet (750 mg total) by mouth 3 (three) times daily.   multivitamin with minerals Tabs tablet Take 1 tablet by mouth daily. Start taking on: March 30, 2020   oxyCODONE 5 MG immediate release tablet Commonly known as: Oxy IR/ROXICODONE Take 1 tablet (5 mg total) by mouth every 4 (four) hours as needed for severe pain.   predniSONE 20 MG tablet Commonly known as: Deltasone Take 2 tablets (40 mg total) by mouth daily with breakfast.   prochlorperazine 10 MG tablet Commonly known as: COMPAZINE Take 1 tablet (10 mg total) by mouth every 6 (six) hours as needed (Nausea or vomiting).   Spiriva HandiHaler 18 MCG inhalation capsule Generic drug: tiotropium Place 1 capsule (18 mcg total) into inhaler and inhale daily.            Durable Medical Equipment  (From admission, onward)         Start     Ordered   03/29/20 1751  For home use only DME Nebulizer machine  Once    Question Answer Comment  Patient needs a nebulizer to treat with the following condition Dyspnea and respiratory abnormalities   Length of Need Lifetime      03/29/20 1751   03/29/20 0955  For home use only DME oxygen  Once    Comments: SATURATION QUALIFICATIONS: (Thisnote is usedto comply with regulatory documentation for home oxygen)  Patient Saturations on Room Air at Rest =88%  Patient Saturations on [employer] while Ambulating =83 %  Patient Saturations on2Liters of oxygen while Ambulating = 93 %   Patient needs continuous O2 at 2 L/min continuously via nasal cannula with humidifier, with gaseous portability and conserving device    -Diagnosis---- COPD and Lung Cancer--  Question Answer Comment  Length of Need Lifetime   Mode or (Route) Nasal cannula   Liters per Minute 2   Frequency Continuous (stationary and portable oxygen unit needed)   Oxygen conserving device Yes   Oxygen delivery system Gas      03/29/20 0955   03/29/20 0000  For home use only DME Nebulizer  machine    Question Answer Comment  Patient needs a nebulizer to treat with the following condition Dyspnea   Length of Need Lifetime      03/29/20 1749          Major procedures and Radiology Reports - PLEASE review detailed and final reports for all details, in brief -  CT Chest W Contrast  Result Date: 03/28/2020 CLINICAL DATA:  Chest pain or shortness of breath. History of lung cancer. Left-sided rib pain starting at 4 a.m. EXAM: CT CHEST WITH CONTRAST TECHNIQUE: Multidetector CT imaging of the chest was performed during intravenous contrast administration. CONTRAST:  26mL OMNIPAQUE IOHEXOL 300 MG/ML  SOLN COMPARISON:  02/03/2020 FINDINGS: Cardiovascular: Normal heart size. No pericardial effusion. Soft tissue density with architectural distortion at the right thoracic inlet and paratracheal mediastinum with chronic venous occlusion and well-formed collaterals. No acute vascular finding. Mediastinum/Nodes: No enlarged or enlarging discrete lymph nodes. Unchanged soft tissue density is noted above. Lungs/Pleura: Micro nodularity at the apices which is more prominent than before. There is bandlike radiation fibrosis about the right hilum. Ground-glass opacity in the posterior costophrenic sulcus on the left. Upper Abdomen: Liver enhancement related to collaterals. Musculoskeletal: No acute or aggressive finding. IMPRESSION: 1. On the symptomatic left side there is subpleural ground-glass opacity at the lower lobe. This could be atelectasis but lung infarct is also considered in this setting. No evidence of rib metastasis/fracture. 2. Bilateral micro nodularity that is progressive from 02/03/2020, increasingly concerning for progressive metastatic disease. 3. Stable soft tissue and architectural distortion at the right thoracic inlet paratracheal mediastinum with venous collaterals. Electronically Signed   By: [name] Fantasia M.D.   On: 03/28/2020 11:38   DG Chest Port 1 View  Result Date:  03/28/2020 CLINICAL DATA:  Shortness of breath. Additional history provided by technologist: Patient reports history of lung cancer, left-sided rib pain which began at 4 a.m. EXAM: PORTABLE CHEST 1 VIEW COMPARISON:  Prior chest CT 02/03/2020, chest radiograph 04/01/2019 FINDINGS: Heart size within normal limits. Chronic elevation of the right hemidiaphragm. Opacity within the medial right upper lobe likely corresponds with previously demonstrated radiation changes. Please refer to prior chest CT 02/03/2020 for a description of nodularity within the bilateral upper lobes as well as ill-defined soft tissue in the medial right supraclavicular region and right paratracheal region. No definite airspace consolidation. No evidence of pleural effusion or pneumothorax. No acute bony abnormality identified. IMPRESSION: Opacity within the medial right upper lobe likely corresponds with previously demonstrated radiation changes. Please refer to prior chest CT 02/03/2020 for a description of nodularity within the bilateral upper lobes as well as ill-defined soft tissue within the medial right supraclavicular region and right paratracheal region. No definite airspace consolidation. Electronically Signed   By: Kellie Simmering DO   On: 03/28/2020 08:53    Micro Results   Recent Results (from the past 240 hour(s))  Blood culture (routine x 2)     Status: None (Preliminary result)   Collection Time: 03/28/20  2:25 PM   Specimen: BLOOD LEFT HAND  Result Value Ref Range Status   Specimen Description BLOOD LEFT HAND  Final   Special Requests   Final    BOTTLES DRAWN AEROBIC ONLY Blood Culture results may not be optimal due to an inadequate volume of blood received in culture bottles   Culture   Final    NO GROWTH < 24 HOURS Performed at [facility], [address], [place], [place] [address]    Report Status PENDING  Incomplete  Blood culture (routine x 2)     Status: None (Preliminary result)   Collection Time: 03/28/20   2:25 PM   Specimen: Left Antecubital; Blood  Result Value Ref Range Status   Specimen Description LEFT ANTECUBITAL  Final   Special Requests   Final    BOTTLES DRAWN AEROBIC  ONLY Blood Culture results may not be optimal due to an inadequate volume of blood received in culture bottles   Culture   Final    NO GROWTH < 24 HOURS Performed at [facility], [address], [place], [place] [address]    Report Status PENDING  Incomplete  SARS Coronavirus 2 by RT PCR (hospital order, performed in [facility] hospital lab) Nasopharyngeal Nasopharyngeal Swab     Status: None   Collection Time: 03/28/20  4:05 PM   Specimen: Nasopharyngeal Swab  Result Value Ref Range Status   SARS Coronavirus 2 NEGATIVE NEGATIVE Final    Comment: (NOTE) SARS-CoV-2 target nucleic acids are NOT DETECTED. The SARS-CoV-2 RNA is generally detectable in upper and lower respiratory specimens during the acute phase of infection. The lowest concentration of SARS-CoV-2 viral copies this assay can detect is 250 copies / mL. A negative result does not preclude SARS-CoV-2 infection and should not be used as the sole basis for treatment or other patient management decisions.  A negative result may occur with improper specimen collection / handling, submission of specimen other than nasopharyngeal swab, presence of viral mutation(s) within the areas targeted by this assay, and inadequate number of viral copies (<250 copies / mL). A negative result must be combined with clinical observations, patient history, and epidemiological information. Fact Sheet for Patients:   StrictlyIdeas.no Fact Sheet for Healthcare Providers: BankingDealers.co.za This test is not yet approved or cleared  by the [place] FDA and has been authorized for detection and/or diagnosis of SARS-CoV-2 by FDA under an Emergency Use Authorization (EUA).  This EUA will remain in effect (meaning this test can  be used) for the duration of the COVID-19 declaration under Section 564(b)(1) of the Act, 21 U.S.C. section 360bbb-3(b)(1), unless the authorization is terminated or revoked sooner. Performed at [facility], [address] [place], [place] [address]    Today   Subjective    Tara Aguilar today has no new complaints -No fever  Or chills   No Nausea, Vomiting or Diarrhea        Patient has been seen and examined prior to discharge   Objective   Blood pressure 105/68, pulse (!) 116, temperature 99 F (37.2 C), temperature source Oral, resp. rate 20, height 5\' 6"  (1.676 m), weight 136.1 kg, last menstrual period 03/24/2020, SpO2 95 %.  No intake or output data in the 24 hours ending 03/29/20 1752  Exam Gen:- Awake Alert, no acute distress  HEENT:- [place].AT, No sclera icterus Neck-Supple Neck,No JVD,.  Lungs-  Diminished breath sounds and few wheezes CV- S1, S2 normal, regular Abd-  +ve B.Sounds, Abd Soft, No tenderness,    Extremity/Skin:- No  edema,   good pulses Psych-affect is appropriate, oriented x3 Neuro-no new focal deficits, no tremors    Data Review   CBC w Diff:  Lab Results  Component Value Date   WBC 9.0 03/29/2020   HGB 14.4 03/29/2020   HCT 46.6 (H) 03/29/2020   PLT 240 03/29/2020   LYMPHOPCT 10 03/28/2020   MONOPCT 6 03/28/2020   EOSPCT 1 03/28/2020   BASOPCT 1 03/28/2020    CMP:  Lab Results  Component Value Date   NA 138 03/29/2020   K 4.4 03/29/2020   CL 97 (L) 03/29/2020   CO2 30 03/29/2020   BUN 15 03/29/2020   CREATININE 1.07 (H) 03/29/2020   PROT 7.8 03/28/2020   ALBUMIN 3.7 03/28/2020   BILITOT 0.4 03/28/2020   ALKPHOS 70 03/28/2020  AST 18 03/28/2020   ALT 24 03/28/2020  .  Total Discharge time is about 33 minutes  Roxan Hockey M.D on 03/29/2020 at 5:52 PM  Go to [email or web address] -  for contact info  [facility] - Office  [phone]

## 2020-03-29 NOTE — Discharge Instructions (Signed)
1) please take albuterol inhaler and/or nebulizer treatments as needed for shortness of breath cough and wheezing 2) you are taking apixaban/Eliquis for blood thinner so please Avoid ibuprofen/Advil/Aleve/Motrin/Goody Powders/Naproxen/BC powders/Meloxicam/Diclofenac/Indomethacin and other Nonsteroidal anti-inflammatory medications as these will make you more likely to bleed and can cause stomach ulcers, can also cause Kidney problems.  3) please follow-up with your oncologist Dr. Delton Coombes as advised 4)you need oxygen at home at 2 L via nasal cannula continuously while awake and while asleep--- smoking or having open fires around oxygen can cause fire, significant injury and death

## 2020-03-29 NOTE — Progress Notes (Signed)
   03/29/20 0105  Assess: MEWS Score  Temp 98.7 F (37.1 C)  BP 139/87  Pulse Rate (!) 120  SpO2 94 %  O2 Device Room Air  Assess: MEWS Score  MEWS Temp 0  MEWS Systolic 0  MEWS Pulse 2  MEWS RR 0  MEWS LOC 0  MEWS Score 2  MEWS Score Color Yellow  Assess: if the MEWS score is Yellow or Red  Were vital signs taken at a resting state? Yes  Focused Assessment Documented focused assessment  Early Detection of Sepsis Score *See Row Information* Low  MEWS guidelines implemented *See Row Information* No, previously yellow, continue vital signs every 4 hours

## 2020-04-02 LAB — CULTURE, BLOOD (ROUTINE X 2)
Culture: NO GROWTH
Culture: NO GROWTH

## 2020-04-11 ENCOUNTER — Inpatient Hospital Stay (HOSPITAL_COMMUNITY): Payer: BC Managed Care – PPO | Attending: Hematology

## 2020-04-11 ENCOUNTER — Other Ambulatory Visit: Payer: Self-pay

## 2020-04-11 ENCOUNTER — Inpatient Hospital Stay (HOSPITAL_BASED_OUTPATIENT_CLINIC_OR_DEPARTMENT_OTHER): Payer: BC Managed Care – PPO | Admitting: Hematology

## 2020-04-11 ENCOUNTER — Inpatient Hospital Stay (HOSPITAL_COMMUNITY): Payer: BC Managed Care – PPO

## 2020-04-11 VITALS — BP 119/73 | HR 114 | Temp 98.0°F | Resp 18 | Wt 299.0 lb

## 2020-04-11 VITALS — BP 141/39 | HR 98 | Temp 97.5°F | Resp 18 | Wt 299.7 lb

## 2020-04-11 DIAGNOSIS — Z7901 Long term (current) use of anticoagulants: Secondary | ICD-10-CM | POA: Diagnosis not present

## 2020-04-11 DIAGNOSIS — Z801 Family history of malignant neoplasm of trachea, bronchus and lung: Secondary | ICD-10-CM | POA: Diagnosis not present

## 2020-04-11 DIAGNOSIS — Z808 Family history of malignant neoplasm of other organs or systems: Secondary | ICD-10-CM | POA: Insufficient documentation

## 2020-04-11 DIAGNOSIS — Z86718 Personal history of other venous thrombosis and embolism: Secondary | ICD-10-CM | POA: Insufficient documentation

## 2020-04-11 DIAGNOSIS — C3490 Malignant neoplasm of unspecified part of unspecified bronchus or lung: Secondary | ICD-10-CM

## 2020-04-11 DIAGNOSIS — F17210 Nicotine dependence, cigarettes, uncomplicated: Secondary | ICD-10-CM | POA: Insufficient documentation

## 2020-04-11 DIAGNOSIS — C7970 Secondary malignant neoplasm of unspecified adrenal gland: Secondary | ICD-10-CM | POA: Insufficient documentation

## 2020-04-11 DIAGNOSIS — Z5112 Encounter for antineoplastic immunotherapy: Secondary | ICD-10-CM | POA: Insufficient documentation

## 2020-04-11 DIAGNOSIS — Z806 Family history of leukemia: Secondary | ICD-10-CM | POA: Diagnosis not present

## 2020-04-11 DIAGNOSIS — R946 Abnormal results of thyroid function studies: Secondary | ICD-10-CM | POA: Diagnosis not present

## 2020-04-11 DIAGNOSIS — G629 Polyneuropathy, unspecified: Secondary | ICD-10-CM | POA: Insufficient documentation

## 2020-04-11 LAB — CBC WITH DIFFERENTIAL/PLATELET
Abs Immature Granulocytes: 0.09 10*3/uL — ABNORMAL HIGH (ref 0.00–0.07)
Basophils Absolute: 0.1 10*3/uL (ref 0.0–0.1)
Basophils Relative: 1 %
Eosinophils Absolute: 0.1 10*3/uL (ref 0.0–0.5)
Eosinophils Relative: 1 %
HCT: 45 % (ref 36.0–46.0)
Hemoglobin: 14 g/dL (ref 12.0–15.0)
Immature Granulocytes: 1 %
Lymphocytes Relative: 14 %
Lymphs Abs: 1.3 10*3/uL (ref 0.7–4.0)
MCH: 30 pg (ref 26.0–34.0)
MCHC: 31.1 g/dL (ref 30.0–36.0)
MCV: 96.4 fL (ref 80.0–100.0)
Monocytes Absolute: 0.7 10*3/uL (ref 0.1–1.0)
Monocytes Relative: 7 %
Neutro Abs: 7.2 10*3/uL (ref 1.7–7.7)
Neutrophils Relative %: 76 %
Platelets: 238 10*3/uL (ref 150–400)
RBC: 4.67 MIL/uL (ref 3.87–5.11)
RDW: 14.4 % (ref 11.5–15.5)
WBC: 9.4 10*3/uL (ref 4.0–10.5)
nRBC: 0 % (ref 0.0–0.2)

## 2020-04-11 LAB — COMPREHENSIVE METABOLIC PANEL
ALT: 27 U/L (ref 0–44)
AST: 18 U/L (ref 15–41)
Albumin: 3.8 g/dL (ref 3.5–5.0)
Alkaline Phosphatase: 59 U/L (ref 38–126)
Anion gap: 8 (ref 5–15)
BUN: 19 mg/dL (ref 6–20)
CO2: 28 mmol/L (ref 22–32)
Calcium: 9.2 mg/dL (ref 8.9–10.3)
Chloride: 102 mmol/L (ref 98–111)
Creatinine, Ser: 0.99 mg/dL (ref 0.44–1.00)
GFR calc Af Amer: >60 mL/min (ref >60)
GFR calc non Af Amer: >60 mL/min (ref >60)
Glucose, Bld: 105 mg/dL — ABNORMAL HIGH (ref 70–99)
Potassium: 4.2 mmol/L (ref 3.5–5.1)
Sodium: 138 mmol/L (ref 135–145)
Total Bilirubin: 0.5 mg/dL (ref 0.3–1.2)
Total Protein: 7.5 g/dL (ref 6.5–8.1)

## 2020-04-11 LAB — MAGNESIUM: Magnesium: 1.9 mg/dL (ref 1.7–2.4)

## 2020-04-11 MED ORDER — SODIUM CHLORIDE 0.9 % IV SOLN: Freq: Once | INTRAVENOUS | Status: AC

## 2020-04-11 MED ORDER — SODIUM CHLORIDE 0.9 % IV SOLN
200.0000 mg | Freq: Once | INTRAVENOUS | Status: AC
  Administered 2020-04-11: 200 mg via INTRAVENOUS
  Filled 2020-04-11: qty 8

## 2020-04-11 NOTE — Progress Notes (Signed)
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  PRIOR THERAPY:  1. 7 cycles of carboplatin, paclitaxel and pembrolizumab from 04/08/2019 through 08/16/2019.  NGS Results: Not done.  CURRENT THERAPY: Keytruda every 3 weeks  BRIEF ONCOLOGIC HISTORY:  Oncology History  Squamous cell lung cancer, unspecified laterality ([place])  04/05/2019 Initial Diagnosis   Squamous cell lung cancer, unspecified laterality ([place])   04/08/2019 -  Chemotherapy   The patient had palonosetron (ALOXI) injection 0.25 mg, 0.25 mg, Intravenous,  Once, 7 of 7 cycles Administration: 0.25 mg (04/08/2019), 0.25 mg (05/02/2019), 0.25 mg (05/23/2019), 0.25 mg (06/13/2019), 0.25 mg (07/05/2019), 0.25 mg (07/26/2019), 0.25 mg (08/16/2019) pegfilgrastim (NEULASTA) injection 6 mg, 6 mg, Subcutaneous, Once, 2 of 2 cycles Administration: 6 mg (04/11/2019), 6 mg (05/04/2019) pegfilgrastim (NEULASTA ONPRO KIT) injection 6 mg, 6 mg, Subcutaneous, Once, 3 of 3 cycles pegfilgrastim-cbqv (UDENYCA) injection 6 mg, 6 mg, Subcutaneous, Once, 5 of 5 cycles Administration: 6 mg (05/25/2019), 6 mg (06/15/2019), 6 mg (07/07/2019), 6 mg (07/28/2019), 6 mg (08/18/2019) CARBOplatin (PARAPLATIN) 900 mg in sodium chloride 0.9 % 500 mL chemo infusion, 900 mg (100 % of original dose 900 mg), Intravenous,  Once, 7 of 7 cycles Dose modification:   (original dose 900 mg, Cycle 1),   (original dose 900 mg, Cycle 2),   (original dose 900 mg, Cycle 3),   (original dose 900 mg, Cycle 4) Administration: 900 mg (04/08/2019), 900 mg (05/02/2019), 900 mg (05/23/2019), 900 mg (06/13/2019), 900 mg (07/05/2019), 900 mg (07/26/2019), 900 mg (08/16/2019) PACLitaxel (TAXOL) 492 mg in sodium chloride 0.9 % 500 mL chemo infusion (> 71m/m2), 200 mg/m2 = 492 mg,  Intravenous,  Once, 7 of 7 cycles Dose modification: 175 mg/m2 (original dose 200 mg/m2, Cycle 6, Reason: Other (see comments), Comment: neuropathy) Administration: 492 mg (04/08/2019), 492 mg (05/02/2019), 492 mg (05/23/2019), 492 mg (06/13/2019), 492 mg (07/05/2019), 432 mg (07/26/2019), 432 mg (08/16/2019) pembrolizumab (KEYTRUDA) 200 mg in sodium chloride 0.9 % 50 mL chemo infusion, 200 mg, Intravenous, Once, 18 of 19 cycles Administration: 200 mg (04/08/2019), 200 mg (07/05/2019), 200 mg (05/02/2019), 200 mg (05/23/2019), 200 mg (06/13/2019), 200 mg (07/26/2019), 200 mg (08/16/2019), 200 mg (09/06/2019), 200 mg (09/27/2019), 200 mg (10/19/2019), 200 mg (11/09/2019), 200 mg (11/30/2019), 200 mg (12/21/2019), 200 mg (01/12/2020), 200 mg (02/07/2020), 200 mg (02/28/2020), 200 mg (03/21/2020) fosaprepitant (EMEND) 150 mg, dexamethasone (DECADRON) 12 mg in sodium chloride 0.9 % 145 mL IVPB, , Intravenous,  Once, 7 of 7 cycles Administration:  (04/08/2019),  (05/02/2019),  (05/23/2019),  (06/13/2019),  (07/05/2019),  (07/26/2019),  (08/16/2019)  for chemotherapy treatment.      CANCER STAGING: Cancer Staging No matching staging information was found for the patient.  INTERVAL HISTORY:  Tara Aguilar a 39y.o. female, returns for routine follow-up and consideration for next cycle of chemotherapy. TSarahiwas last seen on 03/21/2020.  Due for cycle #18 of Keytruda today.   She was in [facility] 03/[facility]/2020 through 03/29/2020 for CP, back pain and SOB. She was discharged with antibiotics for 5 days, Prednisone for 7 days, and she missed 2 doses of Eliquis, but she has since restarted it. [facility]. She currently denies N/V/D and her breathing has improved.  Overall,  she feels ready for next cycle of chemo today.    REVIEW OF SYSTEMS:  Review of Systems  Constitutional: Positive for fatigue (moderate). Negative for appetite change.  Neurological: Positive for numbness (hands & feet).  All  other systems reviewed and are negative.   PAST MEDICAL/SURGICAL HISTORY:  Past Medical History:  Diagnosis Date  . Cancer ([place])   . Depression   . Glucose intolerance 03/11/2019  . Obesity, Class III, BMI 40-49.9 (morbid obesity) ([place]) 03/11/2019   Past Surgical History:  Procedure Laterality Date  . CESAREAN SECTION    . IRRIGATION AND DEBRIDEMENT ABSCESS  03/15/2019   RETROPHARYNGEAL   . TONSILLECTOMY AND ADENOIDECTOMY N/A 03/15/2019   Procedure: IRRIGATION AND DEBRIDEMENT OF RETROPHARYNGEAL ABSCESS;  Surgeon: Leta Baptist, MD;  Location: [facility];  Service: ENT;  Laterality: N/A;    SOCIAL HISTORY:  Social History   Socioeconomic History  . Marital status: Widowed    Spouse name: Not on file  . Number of children: 1  . Years of education: Not on file  . Highest education level: Not on file  Occupational History  . Not on file  Tobacco Use  . Smoking status: Current Some Day Smoker    Packs/day: 1.00    Years: 25.00    Pack years: 25.00    Types: Cigarettes  . Smokeless tobacco: Never Used  Vaping Use  . Vaping Use: Never used  Substance and Sexual Activity  . Alcohol use: Not Currently  . Drug use: Not Currently  . Sexual activity: Not on file  Other Topics Concern  . Not on file  Social History Narrative   03/30/19   Husband passed away four months ago from Cancer of the [place].   Pt. Has 64 yo son at home.   Social Determinants of Health   Financial Resource Strain:   . Difficulty of Paying Living Expenses:   Food Insecurity:   . Worried About Charity fundraiser in the Last Year:   . Arboriculturist in the Last Year:   Transportation Needs:   . Film/video editor (Medical):   [name] Lack of Transportation (Non-Medical):   Physical Activity:   . Days of Exercise per Week:   . Minutes of Exercise per Session:   Stress:   . Feeling of Stress :   Social Connections:   . Frequency of Communication with Friends and Family:   . Frequency of Social Gatherings  with Friends and Family:   . Attends Religious Services:   . Active Member of Clubs or Organizations:   . Attends Archivist Meetings:   [name] Marital Status:   Intimate Partner Violence:   . Fear of Current or Ex-Partner:   . Emotionally Abused:   [name] Physically Abused:   . Sexually Abused:     FAMILY HISTORY:  Family History  Problem Relation Age of Onset  . Lung cancer Mother   . Hypertension Mother   . Leukemia Father   . Myelodysplastic syndrome Father     CURRENT MEDICATIONS:  Current Outpatient Medications  Medication Sig Dispense Refill  . apixaban (ELIQUIS) 5 MG TABS tablet Take 1 tablet (5 mg total) by mouth 2 (two) times daily. 60 tablet 5  . gabapentin (NEURONTIN) 300 MG capsule Take 1 capsule (300 mg total) by mouth 3 (three) times daily. Take 300 mg in the morning, 300 mg at noon and 600 mg at bedtime. (Patient taking differently: Take 300 mg by mouth 2 (two)  times daily as needed. ) 120 capsule 2  . methocarbamol (ROBAXIN) 750 MG tablet Take 1 tablet (750 mg total) by mouth 3 (three) times daily. 90 tablet 1  . Multiple Vitamin (MULTIVITAMIN WITH MINERALS) TABS tablet Take 1 tablet by mouth daily. 120 tablet 2  . tiotropium (SPIRIVA HANDIHALER) 18 MCG inhalation capsule Place 1 capsule (18 mcg total) into inhaler and inhale daily. 30 capsule 2  . albuterol (PROVENTIL) (2.5 MG/3ML) 0.083% nebulizer solution Take 3 mLs (2.5 mg total) by nebulization every 2 (two) hours as needed for wheezing. 75 mL 12  . albuterol (VENTOLIN HFA) 108 (90 Base) MCG/ACT inhaler Inhale 2 puffs into the lungs every 6 (six) hours as needed for wheezing or shortness of breath. 18 g 3  . Nebulizers (COMPRESSOR/NEBULIZER) MISC 1 Units by Does not apply route as needed. Nebulizer treatments as needed for shortness of breath cough and wheezing 1 each 0  . oxyCODONE (OXY IR/ROXICODONE) 5 MG immediate release tablet Take 1 tablet (5 mg total) by mouth every 4 (four) hours as needed for severe  pain. 90 tablet 0  . prochlorperazine (COMPAZINE) 10 MG tablet Take 1 tablet (10 mg total) by mouth every 6 (six) hours as needed (Nausea or vomiting). 30 tablet 1   No current facility-administered medications for this visit.   Facility-Administered Medications Ordered in Other Visits  Medication Dose Route Frequency Provider Last Rate Last Admin  . pembrolizumab (KEYTRUDA) 200 mg in sodium chloride 0.9 % 50 mL chemo infusion  200 mg Intravenous Once Derek Jack, MD        ALLERGIES:  No Known Allergies  PHYSICAL EXAM:  Performance status (ECOG): 1 - Symptomatic but completely ambulatory  Vitals:   04/11/20 1516  BP: 119/73  Pulse: (!) 114  Resp: 18  Temp: 98 F (36.7 C)  SpO2: 94%   Wt Readings from Last 3 Encounters:  04/11/20 299 lb (135.6 kg)  04/11/20 299 lb 11.2 oz (135.9 kg)  03/28/20 300 lb (136.1 kg)   Physical Exam Vitals reviewed.  Constitutional:      Appearance: Normal appearance. She is obese.  Cardiovascular:     Rate and Rhythm: Normal rate and regular rhythm.     Pulses: Normal pulses.     Heart sounds: Normal heart sounds.  Pulmonary:     Effort: Pulmonary effort is normal.     Breath sounds: Normal breath sounds.  Neurological:     General: No focal deficit present.     Mental Status: She is alert and oriented to person, place, and time.  Psychiatric:        Mood and Affect: Mood normal.        Behavior: Behavior normal.     LABORATORY DATA:  I have reviewed the labs as listed.  CBC Latest Ref Rng & Units 04/11/2020 03/29/2020 03/28/2020  WBC 4.0 - 10.5 K/uL 9.4 9.0 12.0(H)  Hemoglobin 12.0 - 15.0 g/dL 14.0 14.4 15.4(H)  Hematocrit 36 - 46 % 45.0 46.6(H) 47.0(H)  Platelets 150 - 400 K/uL 238 240 279   CMP Latest Ref Rng & Units 04/11/2020 03/29/2020 03/28/2020  Glucose 70 - 99 mg/dL 105(H) 127(H) 143(H)  BUN 6 - 20 mg/dL _0 Creatinine 0.44 - 1.00 mg/dL 0.99 1.07(H) 1.04(H)  Sodium 135 - 145 mmol/L 138 138 138  Potassium 3.5 -  5.1 mmol/L 4.2 4.4 3.9  Chloride 98 - 111 mmol/L 102 97(L) 101  CO2 22 - 32 mmol/L 28 30 26  Calcium 8.9 - 10.3 mg/dL 9.2 9.2 9.4  Total Protein 6.5 - 8.1 g/dL 7.5 - 7.8  Total Bilirubin 0.3 - 1.2 mg/dL 0.5 - 0.4  Alkaline Phos 38 - 126 U/L 59 - 70  AST 15 - 41 U/L 18 - 18  ALT 0 - 44 U/L 27 - 24    DIAGNOSTIC IMAGING:  I have independently reviewed the scans and discussed with the patient. CT Chest W Contrast  Result Date: 03/28/2020 CLINICAL DATA:  Chest pain or shortness of breath. History of lung cancer. Left-sided rib pain starting at 4 a.m. EXAM: CT CHEST WITH CONTRAST TECHNIQUE: Multidetector CT imaging of the chest was performed during intravenous contrast administration. CONTRAST:  43m OMNIPAQUE IOHEXOL 300 MG/ML  SOLN COMPARISON:  02/03/2020 FINDINGS: Cardiovascular: Normal heart size. No pericardial effusion. Soft tissue density with architectural distortion at the right thoracic inlet and paratracheal mediastinum with chronic venous occlusion and well-formed collaterals. No acute vascular finding. Mediastinum/Nodes: No enlarged or enlarging discrete lymph nodes. Unchanged soft tissue density is noted above. Lungs/Pleura: Micro nodularity at the apices which is more prominent than before. There is bandlike radiation fibrosis about the right hilum. Ground-glass opacity in the posterior costophrenic sulcus on the left. Upper Abdomen: Liver enhancement related to collaterals. Musculoskeletal: No acute or aggressive finding. IMPRESSION: 1. On the symptomatic left side there is subpleural ground-glass opacity at the lower lobe. This could be atelectasis but lung infarct is also considered in this setting. No evidence of rib metastasis/fracture. 2. Bilateral micro nodularity that is progressive from 02/03/2020, increasingly concerning for progressive metastatic disease. 3. Stable soft tissue and architectural distortion at the right thoracic inlet paratracheal mediastinum with venous collaterals.  Electronically Signed   By: JMonte FantasiaM.D.   On: 03/28/2020 11:38   DG Chest Port 1 View  Result Date: 03/28/2020 CLINICAL DATA:  Shortness of breath. Additional history provided by technologist: Patient reports history of lung cancer, left-sided rib pain which began at 4 a.m. EXAM: PORTABLE CHEST 1 VIEW COMPARISON:  Prior chest CT 02/03/2020, chest radiograph 04/01/2019 FINDINGS: Heart size within normal limits. Chronic elevation of the right hemidiaphragm. Opacity within the medial right upper lobe likely corresponds with previously demonstrated radiation changes. Please refer to prior chest CT 02/03/2020 for a description of nodularity within the bilateral upper lobes as well as ill-defined soft tissue in the medial right supraclavicular region and right paratracheal region. No definite airspace consolidation. No evidence of pleural effusion or pneumothorax. No acute bony abnormality identified. IMPRESSION: Opacity within the medial right upper lobe likely corresponds with previously demonstrated radiation changes. Please refer to prior chest CT 02/03/2020 for a description of nodularity within the bilateral upper lobes as well as ill-defined soft tissue within the medial right supraclavicular region and right paratracheal region. No definite airspace consolidation. Electronically Signed   By: KKellie SimmeringDO   On: 03/28/2020 08:53     ASSESSMENT:  1. 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 -Maintenance pembrolizumab started on 09/06/2019. -MRI brain from 02/03/2020 showed 4 mm left parietal nodular enhancement. Differential includes metastatic versus subacute infarction. -CT CAP on 02/03/2020 showed new nodularity in the bilateral upper lungs measuring 6 mm. Ill-defined soft tissue in the medial right supraclavicular region and right paratracheal region stable versus mildly improved.  Stable mild axillary lymphadenopathy, right greater than left. No suspicious findings in the abdomen or pelvis. -CT chest with contrast on 03/28/2020  showed subpleural groundglass opacity in the left lower lobe consistent with atelectasis/infection.  Bilateral micronodularity, mostly in the upper part of the lungs progressive from 12/29/3612.  2.  Left basilic vein thrombus: -Ultrasound on 05/03/2019 showed nonocclusive thrombus around the PICC line in the left basilic vein considered superficial thrombus with no evidence of DVT. -Because of her metastatic cancer, she was told to continue Eliquis.    PLAN:  1.  Metastatic squamous cell carcinoma of the lung: -She recently went to ER with chest and back pain which resolved.  I have reviewed recent CT scan images with the patient.  I do not believe there is any progression of her malignancy. -She is feeling well and does not have any chest pains at this time. -I have reviewed her labs which are grossly within normal limits.  She will proceed with her next cycle of Keytruda. -She is scheduled for MRI of the brain prior to next visit.  I will cancel her CT scans as she had CT of the chest done in the ER.  2.  Peripheral neuropathy: -Continue gabapentin 300 mg twice daily.  3.  Left basilic vein thrombus: -Continue Eliquis.  4.  Thyroid abnormality: -TSH last was 4.13.  No intervention necessary.   Orders placed this encounter:  No orders of the defined types were placed in this encounter.    Derek Jack, MD [place] [phone]   I, Milinda Antis, am acting as a scribe for Dr. Sanda Linger.  I, Derek Jack MD, have reviewed the above documentation for accuracy and completeness, and I agree with the above.

## 2020-04-11 NOTE — Progress Notes (Signed)
Patient seen by Dr. Delton Coombes due to recent hospitalization with verbal order ok to treat today.     Patient tolerated therapy with no complaints voiced.  Side effects with management reviewed with understanding verbalized.  Peripheral IV site clean and dry with no bruising or swelling noted at site.  Good blood return noted before and after administration of therapy.  Band aid applied.  Patient left ambulatory with VSS and no s/s of distress noted.

## 2020-04-11 NOTE — Patient Instructions (Signed)
[place] at [facility] Discharge Instructions  You were seen today by Dr. Delton Coombes. He went over your recent results. Continue your routine care with your primary care provider. Dr. Delton Coombes will see you back in 3 weeks for labs and follow up.   Thank you for choosing [place] at [facility] to provide your oncology and hematology care.  To afford each patient quality time with our provider, please arrive at least 15 minutes before your scheduled appointment time.   If you have a lab appointment with the [facility] please come in thru the Main Entrance and check in at the main information desk  You need to re-schedule your appointment should you arrive 10 or more minutes late.  We strive to give you quality time with our providers, and arriving late affects you and other patients whose appointments are after yours.  Also, if you no show three or more times for appointments you may be dismissed from the clinic at the providers discretion.     Again, thank you for choosing [facility].  Our hope is that these requests will decrease the amount of time that you wait before being seen by our physicians.       _____________________________________________________________  Should you have questions after your visit to [facility], please contact our office at [phone] between the hours of 8:00 a.m. and 4:30 p.m.  Voicemails left after 4:00 p.m. will not be returned until the following business day.  For prescription refill requests, have your pharmacy contact our office and allow 72 hours.    Cancer Center Support Programs:   > Cancer Support Group  2nd Tuesday of the month 1pm-2pm, Journey Room

## 2020-04-18 DIAGNOSIS — Z515 Encounter for palliative care: Secondary | ICD-10-CM

## 2020-04-26 ENCOUNTER — Other Ambulatory Visit: Payer: Self-pay

## 2020-04-26 ENCOUNTER — Ambulatory Visit (HOSPITAL_COMMUNITY)
Admission: RE | Admit: 2020-04-26 | Discharge: 2020-04-26 | Disposition: A | Discharge status: Home / Self Care | Payer: BC Managed Care – PPO | Source: Ambulatory Visit | Attending: Hematology | Admitting: Hematology

## 2020-04-26 DIAGNOSIS — C3490 Malignant neoplasm of unspecified part of unspecified bronchus or lung: Secondary | ICD-10-CM | POA: Insufficient documentation

## 2020-04-26 MED ORDER — GADOBUTROL 1 MMOL/ML IV SOLN
10.0000 mL | Freq: Once | INTRAVENOUS | Status: AC | PRN
  Administered 2020-04-26: 10 mL via INTRAVENOUS

## 2020-04-27 ENCOUNTER — Ambulatory Visit (HOSPITAL_COMMUNITY): Admission: RE | Admit: 2020-04-27 | Payer: BC Managed Care – PPO | Source: Ambulatory Visit

## 2020-05-02 ENCOUNTER — Inpatient Hospital Stay (HOSPITAL_COMMUNITY): Payer: BC Managed Care – PPO | Admitting: Hematology

## 2020-05-02 ENCOUNTER — Inpatient Hospital Stay (HOSPITAL_COMMUNITY): Payer: BC Managed Care – PPO | Attending: Hematology

## 2020-05-02 ENCOUNTER — Other Ambulatory Visit: Payer: Self-pay

## 2020-05-02 ENCOUNTER — Inpatient Hospital Stay (HOSPITAL_COMMUNITY): Payer: BC Managed Care – PPO

## 2020-05-02 VITALS — BP 123/57 | HR 117 | Temp 97.9°F | Resp 18 | Wt 299.9 lb

## 2020-05-02 DIAGNOSIS — C3490 Malignant neoplasm of unspecified part of unspecified bronchus or lung: Secondary | ICD-10-CM

## 2020-05-02 DIAGNOSIS — Z806 Family history of leukemia: Secondary | ICD-10-CM | POA: Insufficient documentation

## 2020-05-02 DIAGNOSIS — Z79899 Other long term (current) drug therapy: Secondary | ICD-10-CM | POA: Insufficient documentation

## 2020-05-02 DIAGNOSIS — C7931 Secondary malignant neoplasm of brain: Secondary | ICD-10-CM | POA: Insufficient documentation

## 2020-05-02 DIAGNOSIS — R531 Weakness: Secondary | ICD-10-CM | POA: Insufficient documentation

## 2020-05-02 DIAGNOSIS — R519 Headache, unspecified: Secondary | ICD-10-CM | POA: Insufficient documentation

## 2020-05-02 DIAGNOSIS — Z801 Family history of malignant neoplasm of trachea, bronchus and lung: Secondary | ICD-10-CM | POA: Insufficient documentation

## 2020-05-02 DIAGNOSIS — R05 Cough: Secondary | ICD-10-CM | POA: Insufficient documentation

## 2020-05-02 DIAGNOSIS — I82612 Acute embolism and thrombosis of superficial veins of left upper extremity: Secondary | ICD-10-CM | POA: Insufficient documentation

## 2020-05-02 DIAGNOSIS — Z8249 Family history of ischemic heart disease and other diseases of the circulatory system: Secondary | ICD-10-CM | POA: Insufficient documentation

## 2020-05-02 DIAGNOSIS — R946 Abnormal results of thyroid function studies: Secondary | ICD-10-CM | POA: Insufficient documentation

## 2020-05-02 DIAGNOSIS — F17210 Nicotine dependence, cigarettes, uncomplicated: Secondary | ICD-10-CM | POA: Insufficient documentation

## 2020-05-02 DIAGNOSIS — G629 Polyneuropathy, unspecified: Secondary | ICD-10-CM | POA: Insufficient documentation

## 2020-05-02 DIAGNOSIS — R0602 Shortness of breath: Secondary | ICD-10-CM | POA: Insufficient documentation

## 2020-05-02 DIAGNOSIS — R5383 Other fatigue: Secondary | ICD-10-CM | POA: Insufficient documentation

## 2020-05-02 DIAGNOSIS — C7970 Secondary malignant neoplasm of unspecified adrenal gland: Secondary | ICD-10-CM | POA: Insufficient documentation

## 2020-05-02 DIAGNOSIS — Z7901 Long term (current) use of anticoagulants: Secondary | ICD-10-CM | POA: Insufficient documentation

## 2020-05-02 LAB — COMPREHENSIVE METABOLIC PANEL WITH GFR
ALT: 30 U/L (ref 0–44)
AST: 14 U/L — ABNORMAL LOW (ref 15–41)
Albumin: 3.8 g/dL (ref 3.5–5.0)
Alkaline Phosphatase: 69 U/L (ref 38–126)
Anion gap: 9 (ref 5–15)
BUN: 16 mg/dL (ref 6–20)
CO2: 27 mmol/L (ref 22–32)
Calcium: 9.1 mg/dL (ref 8.9–10.3)
Chloride: 102 mmol/L (ref 98–111)
Creatinine, Ser: 0.9 mg/dL (ref 0.44–1.00)
GFR calc Af Amer: >60 mL/min
GFR calc non Af Amer: >60 mL/min
Glucose, Bld: 119 mg/dL — ABNORMAL HIGH (ref 70–99)
Potassium: 4.2 mmol/L (ref 3.5–5.1)
Sodium: 138 mmol/L (ref 135–145)
Total Bilirubin: 0.4 mg/dL (ref 0.3–1.2)
Total Protein: 7.5 g/dL (ref 6.5–8.1)

## 2020-05-02 LAB — CBC WITH DIFFERENTIAL/PLATELET
Abs Immature Granulocytes: 0.06 10*3/uL (ref 0.00–0.07)
Basophils Absolute: 0.1 10*3/uL (ref 0.0–0.1)
Basophils Relative: 1 %
Eosinophils Absolute: 0.1 10*3/uL (ref 0.0–0.5)
Eosinophils Relative: 1 %
HCT: 47.8 % — ABNORMAL HIGH (ref 36.0–46.0)
Hemoglobin: 15.2 g/dL — ABNORMAL HIGH (ref 12.0–15.0)
Immature Granulocytes: 1 %
Lymphocytes Relative: 17 %
Lymphs Abs: 1.3 10*3/uL (ref 0.7–4.0)
MCH: 30.3 pg (ref 26.0–34.0)
MCHC: 31.8 g/dL (ref 30.0–36.0)
MCV: 95.2 fL (ref 80.0–100.0)
Monocytes Absolute: 0.5 10*3/uL (ref 0.1–1.0)
Monocytes Relative: 7 %
Neutro Abs: 5.6 10*3/uL (ref 1.7–7.7)
Neutrophils Relative %: 73 %
Platelets: 246 10*3/uL (ref 150–400)
RBC: 5.02 MIL/uL (ref 3.87–5.11)
RDW: 14 % (ref 11.5–15.5)
WBC: 7.7 10*3/uL (ref 4.0–10.5)
nRBC: 0 % (ref 0.0–0.2)

## 2020-05-02 LAB — MAGNESIUM: Magnesium: 1.9 mg/dL (ref 1.7–2.4)

## 2020-05-02 MED ORDER — SODIUM CHLORIDE 0.9 % IV SOLN
10.0000 mg | Freq: Once | INTRAVENOUS | Status: AC
  Administered 2020-05-02: 10 mg via INTRAVENOUS
  Filled 2020-05-02: qty 10

## 2020-05-02 MED ORDER — SODIUM CHLORIDE 0.9 % IV SOLN: INTRAVENOUS | Status: DC

## 2020-05-02 MED ORDER — DEXAMETHASONE 4 MG PO TABS
4.0000 mg | ORAL_TABLET | Freq: Three times a day (TID) | ORAL | 1 refills | Status: DC
Start: 2020-05-02 — End: 2020-07-03

## 2020-05-02 NOTE — Progress Notes (Signed)
Patient has been assessed, vital signs and labs have been reviewed by Dr. Delton Coombes. HOLD treatment today. Please give 10 mg dexamethasone IV per Dr. Delton Coombes.

## 2020-05-02 NOTE — Patient Instructions (Signed)
[place] at [facility] Discharge Instructions  You were seen today by Dr. Delton Coombes. He went over your recent results and scans. You received dexamethasone IV and will get a prescription to take dexamethasone at home. Dr. Delton Coombes will see you back in 2 weeks for labs and follow up.   Thank you for choosing [place] at [facility] to provide your oncology and hematology care.  To afford each patient quality time with our provider, please arrive at least 15 minutes before your scheduled appointment time.   If you have a lab appointment with the [facility] please come in thru the Main Entrance and check in at the main information desk  You need to re-schedule your appointment should you arrive 10 or more minutes late.  We strive to give you quality time with our providers, and arriving late affects you and other patients whose appointments are after yours.  Also, if you no show three or more times for appointments you may be dismissed from the clinic at the providers discretion.     Again, thank you for choosing [facility].  Our hope is that these requests will decrease the amount of time that you wait before being seen by our physicians.       _____________________________________________________________  Should you have questions after your visit to [facility], please contact our office at [phone] between the hours of 8:00 a.m. and 4:30 p.m.  Voicemails left after 4:00 p.m. will not be returned until the following business day.  For prescription refill requests, have your pharmacy contact our office and allow 72 hours.    Cancer Center Support Programs:   > Cancer Support Group  2nd Tuesday of the month 1pm-2pm, Journey Room

## 2020-05-02 NOTE — Patient Instructions (Signed)
[facility] at [facility]  Discharge Instructions:   _______________________________________________________________  Thank you for choosing [facility] at [facility] to provide your oncology and hematology care.  To afford each patient quality time with our providers, please arrive at least 15 minutes before your scheduled appointment.  You need to re-schedule your appointment if you arrive 10 or more minutes late.  We strive to give you quality time with our providers, and arriving late affects you and other patients whose appointments are after yours.  Also, if you no show three or more times for appointments you may be dismissed from the clinic.  Again, thank you for choosing [facility] at [facility]. Our hope is that these requests will allow you access to exceptional care and in a timely manner. _______________________________________________________________  If you have questions after your visit, please contact our office at [phone] between the hours of 8:30 a.m. and 5:00 p.m. Voicemails left after 4:30 p.m. will not be returned until the following business day. _______________________________________________________________  For prescription refill requests, have your pharmacy contact our office. _______________________________________________________________  Recommendations made by the consultant and any test results will be sent to your referring physician. _______________________________________________________________ 

## 2020-05-02 NOTE — Progress Notes (Signed)
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25 mg (07/05/2019), 0.25 mg (07/26/2019), 0.25 mg (08/16/2019) pegfilgrastim (NEULASTA) injection 6 mg, 6 mg, Subcutaneous, Once, 2 of 2 cycles Administration: 6 mg (04/11/2019), 6 mg (05/04/2019) pegfilgrastim (NEULASTA ONPRO KIT) injection 6 mg, 6 mg, Subcutaneous, Once, 3 of 3 cycles pegfilgrastim-cbqv (UDENYCA) injection 6 mg, 6 mg, Subcutaneous, Once, 5 of 5 cycles Administration: 6 mg (05/25/2019), 6 mg (06/15/2019), 6 mg (07/07/2019), 6 mg (07/28/2019), 6 mg (08/18/2019) CARBOplatin (PARAPLATIN) 900 mg in sodium chloride 0.9 % 500 mL chemo infusion, 900 mg (100 % of original dose 900 mg), Intravenous,  Once, 7 of 7 cycles Dose modification:   (original dose 900 mg, Cycle 1),   (original dose 900 mg, Cycle 2),   (original dose 900 mg, Cycle 3),   (original dose 900 mg, Cycle 4) Administration: 900 mg (04/08/2019), 900 mg (05/02/2019), 900 mg (05/23/2019), 900 mg (06/13/2019), 900 mg (07/05/2019), 900 mg (07/26/2019), 900 mg (08/16/2019) PACLitaxel (TAXOL) 492 mg in sodium chloride 0.9 % 500 mL chemo infusion (> '80mg'$ /m2), 200 mg/m2 = 492 mg, Intravenous,  Once, 7  of 7 cycles Dose modification: 175 mg/m2 (original dose 200 mg/m2, Cycle 6, Reason: Other (see comments), Comment: neuropathy) Administration: 492 mg (04/08/2019), 492 mg (05/02/2019), 492 mg (05/23/2019), 492 mg (06/13/2019), 492 mg (07/05/2019), 432 mg (07/26/2019), 432 mg (08/16/2019) pembrolizumab (KEYTRUDA) 200 mg in sodium chloride 0.9 % 50 mL chemo infusion, 200 mg, Intravenous, Once, 18 of 19 cycles Administration: 200 mg (04/08/2019), 200 mg (07/05/2019), 200 mg (05/02/2019), 200 mg (05/23/2019), 200 mg (06/13/2019), 200 mg (07/26/2019), 200 mg (08/16/2019), 200 mg (09/06/2019), 200 mg (09/27/2019), 200 mg (10/19/2019), 200 mg (11/09/2019), 200 mg (11/30/2019), 200 mg (12/21/2019), 200 mg (01/12/2020), 200 mg (02/07/2020), 200 mg (02/28/2020), 200 mg (03/21/2020), 200 mg (04/11/2020) fosaprepitant (EMEND) 150 mg, dexamethasone (DECADRON) 12 mg in sodium chloride 0.9 % 145 mL IVPB, , Intravenous,  Once, 7 of 7 cycles Administration:  (04/08/2019),  (05/02/2019),  (05/23/2019),  (06/13/2019),  (07/05/2019),  (07/26/2019),  (08/16/2019)  for chemotherapy treatment.      CANCER STAGING: Cancer Staging No matching staging information was found for the patient.  INTERVAL HISTORY:  [name], a 39 y.o. female, returns for routine follow-up and consideration for next cycle of chemotherapy. Tara Aguilar was last seen on 04/11/2020.  Due for cycle #19 of Keytruda today.   Today she complains of headaches and numbness and weakness in her right hand. Her weakness started 1 week ago and she has been dropping things constantly since then. She also reports occasional difficulty finding the necessary words, which is also recent.   REVIEW OF SYSTEMS:  Review of Systems  Constitutional: Positive for fatigue (moderate). Negative for  appetite change.  Respiratory: Positive for cough and shortness of breath.   Neurological: Positive for extremity weakness (R hand), headaches and numbness (bilat feet).  All other systems reviewed and  are negative.   PAST MEDICAL/SURGICAL HISTORY:  Past Medical History:  Diagnosis Date  . Cancer ([place])   . Depression   . Glucose intolerance 03/11/2019  . Obesity, Class III, BMI 40-49.9 (morbid obesity) ([place]) 03/11/2019   Past Surgical History:  Procedure Laterality Date  . CESAREAN SECTION    . IRRIGATION AND DEBRIDEMENT ABSCESS  03/15/2019   RETROPHARYNGEAL   . TONSILLECTOMY AND ADENOIDECTOMY N/A 03/15/2019   Procedure: IRRIGATION AND DEBRIDEMENT OF RETROPHARYNGEAL ABSCESS;  Surgeon: Leta Baptist, MD;  Location: [facility] OR;  Service: ENT;  Laterality: N/A;    SOCIAL HISTORY:  Social History   Socioeconomic History  . Marital status: Widowed    Spouse name: Not on file  . Number of children: 1  . Years of education: Not on file  . Highest education level: Not on file  Occupational History  . Not on file  Tobacco Use  . Smoking status: Current Some Day Smoker    Packs/day: 1.00    Years: 25.00    Pack years: 25.00    Types: Cigarettes  . Smokeless tobacco: Never Used  Vaping Use  . Vaping Use: Never used  Substance and Sexual Activity  . Alcohol use: Not Currently  . Drug use: Not Currently  . Sexual activity: Not on file  Other Topics Concern  . Not on file  Social History Narrative   April 01, 2019   Husband passed away four months ago from Cancer of the [place].   Pt. Has 67 yo son at home.   Social Determinants of Health   Financial Resource Strain:   . Difficulty of Paying Living Expenses:   Food Insecurity:   . Worried About Charity fundraiser in the Last Year:   . Arboriculturist in the Last Year:   Transportation Needs:   . Film/video editor (Medical):   [name] Lack of Transportation (Non-Medical):   Physical Activity:   . Days of Exercise per Week:   . Minutes of Exercise per Session:   Stress:   . Feeling of Stress :   Social Connections:   . Frequency of Communication with Friends and Family:   . Frequency of Social Gatherings with Friends and Family:     . Attends Religious Services:   . Active Member of Clubs or Organizations:   . Attends Archivist Meetings:   [name] Marital Status:   Intimate Partner Violence:   . Fear of Current or Ex-Partner:   . Emotionally Abused:   [name] Physically Abused:   . Sexually Abused:     FAMILY HISTORY:  Family History  Problem Relation Age of Onset  . Lung cancer Mother   . Hypertension Mother   . Leukemia Father   . Myelodysplastic syndrome Father     CURRENT MEDICATIONS:  Current Outpatient Medications  Medication Sig Dispense Refill  . apixaban (ELIQUIS) 5 MG TABS tablet Take 1 tablet (5 mg total) by mouth 2 (two) times daily. 60 tablet 5  . gabapentin (NEURONTIN) 300 MG capsule Take 1 capsule (300 mg total) by mouth 3 (three) times daily. Take 300 mg in the morning, 300 mg at noon and 600 mg at bedtime. (Patient taking differently: Take 300 mg by mouth 2 (two) times daily as needed. ) 120 capsule 2  . methocarbamol (  ROBAXIN) 750 MG tablet Take 1 tablet (750 mg total) by mouth 3 (three) times daily. 90 tablet 1  . Multiple Vitamin (MULTIVITAMIN WITH MINERALS) TABS tablet Take 1 tablet by mouth daily. 120 tablet 2  . Nebulizers (COMPRESSOR/NEBULIZER) MISC 1 Units by Does not apply route as needed. Nebulizer treatments as needed for shortness of breath cough and wheezing 1 each 0  . oxyCODONE (OXY IR/ROXICODONE) 5 MG immediate release tablet Take 1 tablet (5 mg total) by mouth every 4 (four) hours as needed for severe pain. 90 tablet 0  . tiotropium (SPIRIVA HANDIHALER) 18 MCG inhalation capsule Place 1 capsule (18 mcg total) into inhaler and inhale daily. 30 capsule 2  . albuterol (PROVENTIL) (2.5 MG/3ML) 0.083% nebulizer solution Take 3 mLs (2.5 mg total) by nebulization every 2 (two) hours as needed for wheezing. (Patient not taking: Reported on 05/02/2020) 75 mL 12  . albuterol (VENTOLIN HFA) 108 (90 Base) MCG/ACT inhaler Inhale 2 puffs into the lungs every 6 (six) hours as needed for wheezing  or shortness of breath. (Patient not taking: Reported on 05/02/2020) 18 g 3  . prochlorperazine (COMPAZINE) 10 MG tablet Take 1 tablet (10 mg total) by mouth every 6 (six) hours as needed (Nausea or vomiting). (Patient not taking: Reported on 05/02/2020) 30 tablet 1   No current facility-administered medications for this visit.    ALLERGIES:  No Known Allergies  PHYSICAL EXAM:  Performance status (ECOG): 1 - Symptomatic but completely ambulatory  Vitals:   05/02/20 1255  BP: (!) 123/57  Pulse: (!) 117  Resp: 18  Temp: 97.9 F (36.6 C)  SpO2: 94%   Wt Readings from Last 3 Encounters:  05/02/20 136 kg (299 lb 14.4 oz)  04/11/20 135.6 kg (299 lb)  04/11/20 135.9 kg (299 lb 11.2 oz)   Physical Exam Vitals reviewed.  Constitutional:      Appearance: Normal appearance. She is obese.  Cardiovascular:     Rate and Rhythm: Normal rate and regular rhythm.     Pulses: Normal pulses.     Heart sounds: Normal heart sounds.  Pulmonary:     Effort: Pulmonary effort is normal.     Breath sounds: Normal breath sounds.  Neurological:     General: No focal deficit present.     Mental Status: She is alert and oriented to person, place, and time.     Motor: Weakness (R hand) present.  Psychiatric:        Mood and Affect: Mood normal.        Behavior: Behavior normal.     LABORATORY DATA:  I have reviewed the labs as listed.  CBC Latest Ref Rng & Units 05/02/2020 04/11/2020 03/29/2020  WBC 4.0 - 10.5 K/uL 7.7 9.4 9.0  Hemoglobin 12.0 - 15.0 g/dL 15.2(H) 14.0 14.4  Hematocrit 36 - 46 % 47.8(H) 45.0 46.6(H)  Platelets 150 - 400 K/uL 246 238 240   CMP Latest Ref Rng & Units 05/02/2020 04/11/2020 03/29/2020  Glucose 70 - 99 mg/dL 119(H) 105(H) 127(H)  BUN 6 - 20 mg/dL '16 19 15  '$ Creatinine 0.44 - 1.00 mg/dL 0.90 0.99 1.07(H)  Sodium 135 - 145 mmol/L 138 138 138  Potassium 3.5 - 5.1 mmol/L 4.2 4.2 4.4  Chloride 98 - 111 mmol/L 102 102 97(L)  CO2 22 - 32 mmol/L '27 28 30  '$ Calcium 8.9 - 10.3 mg/dL  9.1 9.2 9.2  Total Protein 6.5 - 8.1 g/dL 7.5 7.5 -  Total Bilirubin 0.3 - 1.2 mg/dL 0.4  0.5 -  Alkaline Phos 38 - 126 U/L 69 59 -  AST 15 - 41 U/L 14(L) 18 -  ALT 0 - 44 U/L 30 27 -    DIAGNOSTIC IMAGING:  I have independently reviewed the scans and discussed with the patient. MR Brain W Wo Contrast  Result Date: 04/27/2020 CLINICAL DATA:  Lung cancer. EXAM: MRI HEAD WITHOUT AND WITH CONTRAST TECHNIQUE: Multiplanar, multiecho pulse sequences of the brain and surrounding structures were obtained without and with intravenous contrast. CONTRAST:  62m GADAVIST GADOBUTROL 1 MMOL/ML IV SOLN COMPARISON:  02/03/2020 MRI head. FINDINGS: Brain: Interval increase in intracranial metastases including the following reference lesions: - Previously visualized left parietal lesion measures 2.0 x 1.6 cm (10:36, previously 0.4 cm). - New dorsal left frontal enhancing lesion measures 1.2 x 0.9 cm (10:39). - Inferior to this there is a 1.7 x 1.5 cm enhancing dorsal left frontal/superior temporal lesion (10:29). All of the aforementioned lesions demonstrate correlative restricted diffusion, T2/FLAIR hyperintense signal and peri-lesional edema. No midline shift, ventriculomegaly or extra-axial fluid collection. SWI signal dropout involving the more superior frontal lesion (7:78) likely reflects intralesional hemorrhage. Vascular: Normal flow voids. Skull and upper cervical spine: Normal marrow signal. Sinuses/Orbits: Normal orbits. Left maxillary sinus mucous retention cyst. Trace right mastoid effusion. Other: None. IMPRESSION: Increased size and number of intracranial metastases with new left frontal/superior temporal lesions. Electronically Signed   By: CPrimitivo GauzeM.D.   On: 04/27/2020 09:21     ASSESSMENT:  1. 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 -Maintenance pembrolizumab started on  09/06/2019. -MRI brain from 02/03/2020 showed 4 mm left parietal nodular enhancement. Differential includes metastatic versus subacute infarction. -CT CAP on 02/03/2020 showed new nodularity in the bilateral upper lungs measuring 6 mm. Ill-defined soft tissue in the medial right supraclavicular region and right paratracheal region stable versus mildly improved. Stable mild axillary lymphadenopathy, right greater than left. No suspicious findings in the abdomen or pelvis. -CT chest with contrast on 03/28/2020 showed subpleural groundglass opacity in the left lower lobe consistent with atelectasis/infection.  Bilateral micronodularity, mostly in the upper part of the lungs progressive from 46/04/6194  2.  Left basilic vein thrombus: -Ultrasound on 05/03/2019 showed nonocclusive thrombus around the PICC line in the left basilic vein considered superficial thrombus with no evidence of DVT. -Because of her metastatic cancer, she was told to continue Eliquis.  3.  Brain metastasis: -MRI of the brain on 04/26/2020 showed left parietal lesion measuring 2.0 x 1.6 cm.  Left frontal enhancing lesion measures 1.2 x 0.9 cm.  1.7 x 1 point centimeters enhancing dorsal left frontal/superior temporal lesion present.   PLAN:  1.  Metastatic squamous cell carcinoma of the lung: -I have reviewed results of the brain MRI and images with the patient. -There are 3 new lesions on the brain MRI, largest measuring 2 x 1.6 cm with surrounding edema. -I will hold off on Keytruda. -She has developed weakness in the right upper extremity. -We will give her 10 mg of IV dexamethasone and start her on 4 mg 3 times daily. -I will reach out to radiation oncology for treatment of brain lesions. -I will evaluate her in 2 weeks.  2.  Peripheral neuropathy: -Continue gabapentin 300 mg twice daily.  3.  Left basilic vein thrombus: -Continue Eliquis.  4.  Thyroid abnormality: -Last TSH was 4.13.   Orders placed this encounter:   No orders  of the defined types were placed in this encounter.    Derek Jack, MD [place] [phone]   I, Milinda Antis, am acting as a scribe for Dr. Sanda Linger.  I, Derek Jack MD, have reviewed the above documentation for accuracy and completeness, and I agree with the above.

## 2020-05-02 NOTE — Progress Notes (Signed)
Patient presents today for treatment and follow up visit with Dr. Delton Coombes. Vital signs stable. Patient has complaints of numbness on there Right and left hands limiting activities.   Message received from [facility] LPN/ Dr. Delton Coombes NO treatment today. Infuse Decadron 10mg  IV x 1 dose now.   Decadron 10mg  given today per MD orders. Tolerated infusion without adverse affects. Vital signs stable. No complaints at this time. Discharged from clinic via wheel chair. F/U with [facility] as scheduled.

## 2020-05-03 ENCOUNTER — Encounter (HOSPITAL_COMMUNITY): Payer: Self-pay | Admitting: Nurse Practitioner

## 2020-05-04 ENCOUNTER — Other Ambulatory Visit: Payer: Self-pay | Admitting: Radiation Therapy

## 2020-05-04 ENCOUNTER — Encounter (HOSPITAL_COMMUNITY): Payer: Self-pay | Admitting: *Deleted

## 2020-05-04 DIAGNOSIS — C7949 Secondary malignant neoplasm of other parts of nervous system: Secondary | ICD-10-CM

## 2020-05-04 DIAGNOSIS — C7931 Secondary malignant neoplasm of brain: Secondary | ICD-10-CM

## 2020-05-09 ENCOUNTER — Ambulatory Visit
Admission: RE | Admit: 2020-05-09 | Discharge: 2020-05-09 | Disposition: A | Discharge status: Home / Self Care | Payer: BC Managed Care – PPO | Source: Ambulatory Visit | Attending: Radiation Oncology | Admitting: Radiation Oncology

## 2020-05-09 ENCOUNTER — Other Ambulatory Visit: Payer: Self-pay

## 2020-05-09 ENCOUNTER — Encounter: Payer: Self-pay | Admitting: Radiation Oncology

## 2020-05-09 DIAGNOSIS — C7931 Secondary malignant neoplasm of brain: Secondary | ICD-10-CM

## 2020-05-09 DIAGNOSIS — C7949 Secondary malignant neoplasm of other parts of nervous system: Secondary | ICD-10-CM

## 2020-05-09 MED ORDER — GADOBENATE DIMEGLUMINE 529 MG/ML IV SOLN
20.0000 mL | Freq: Once | INTRAVENOUS | Status: AC | PRN
  Administered 2020-05-09: 20 mL via INTRAVENOUS

## 2020-05-09 NOTE — Progress Notes (Signed)
Has armband been applied?  Yes  Does patient have an allergy to IV contrast dye?: No   Has patient ever received premedication for IV contrast dye?: No  Does patient take metformin?: No  If patient does take metformin when was the last dose: n/a  Date of lab work: 05/02/2020 BUN: 16 CR: 0.90 Egfr: >60  IV site: Right AC  Has IV site been added to flowsheet?  Yes

## 2020-05-10 ENCOUNTER — Ambulatory Visit
Admission: RE | Admit: 2020-05-10 | Discharge: 2020-05-10 | Disposition: A | Discharge status: Home / Self Care | Payer: BC Managed Care – PPO | Source: Ambulatory Visit | Attending: Radiation Oncology | Admitting: Radiation Oncology

## 2020-05-10 ENCOUNTER — Other Ambulatory Visit: Payer: Self-pay

## 2020-05-10 DIAGNOSIS — C7931 Secondary malignant neoplasm of brain: Secondary | ICD-10-CM | POA: Diagnosis not present

## 2020-05-10 DIAGNOSIS — C3490 Malignant neoplasm of unspecified part of unspecified bronchus or lung: Secondary | ICD-10-CM

## 2020-05-10 DIAGNOSIS — C779 Secondary and unspecified malignant neoplasm of lymph node, unspecified: Secondary | ICD-10-CM

## 2020-05-10 DIAGNOSIS — C7949 Secondary malignant neoplasm of other parts of nervous system: Secondary | ICD-10-CM | POA: Insufficient documentation

## 2020-05-10 MED ORDER — SODIUM CHLORIDE 0.9% FLUSH
10.0000 mL | Freq: Once | INTRAVENOUS | Status: AC
  Administered 2020-05-10: 10 mL via INTRAVENOUS

## 2020-05-10 NOTE — Progress Notes (Signed)
Radiation Oncology         [phone] ________________________________  Name: Tara Aguilar        MRN: [id number]  Date of Service: 05/10/2020 DOB: [birth year]  TD:DUKG, Costella Hatcher, MD  Derek Jack, MD     REFERRING PHYSICIAN: Derek Jack, MD   DIAGNOSIS: The primary encounter diagnosis was Squamous cell carcinoma of lymph node ([place]). A diagnosis of Malignant neoplasm of unspecified part of unspecified bronchus or lung ([place]) was also pertinent to this visit.   HISTORY OF PRESENT ILLNESS: Tara Aguilar is a 39 y.o. with a history of stage IV non-small cell lung cancer, squamous cell carcinoma of the right upper lobe.  She was originally diagnosed after presenting with edema and swelling in that extremity and upper neck in the spring and early summer 2020.  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 Delton Coombes in medical oncology following her hospital admission and radiotherapy treatment.  She received carboplatin Taxol, and pembrolizumab between June and October 2020 followed by maintenance pembrolizumab which she has continued.  She had an MRI in April 2021 for staging purposes which revealed a subcentimeter left parietal nodular area of enhancement which was possibly subacute infarction versus metastatic disease.  It was recommended that she have this repeated, and this was repeated on 04/26/2020, but now revealed an increase in the lesion in the left parietal lobe now measuring 2 x 1.6 cm, a new left frontal lesion measuring 1.2 x 0.9 cm, and inferior to this area also in the left frontal/superior temporal region measuring 1.7 x 1.5 cm.  There was perilesional edema, without midline shift and features were noted in the frontal lesion superiorly with possible inter lesional hemorrhage.  She was counseled on the rationale for a 3T study with the anticipation of offering stereotactic  radiosurgery to treat these areas, this was performed yesterday with and without contrast.  Unfortunately the study was quite limited by motion artifact and the 3 previously seen left cerebral lesions were similar in size, associated with left cerebral edema mildly increased without significant change or mass-effect.  She is contacted today via MyChart to discuss treatment options with stereotactic radiosurgery [facility]).    PREVIOUS RADIATION THERAPY: Yes   03/17/19-03/31/19:  30 Gy in 10 fractions to the RUL target   PAST MEDICAL HISTORY:  Past Medical History:  Diagnosis Date  . Cancer ([place])   . Depression   . Glucose intolerance 03/11/2019  . Obesity, Class III, BMI 40-49.9 (morbid obesity) ([place]) 03/11/2019       PAST SURGICAL HISTORY: Past Surgical History:  Procedure Laterality Date  . CESAREAN SECTION    . IRRIGATION AND DEBRIDEMENT ABSCESS  03/15/2019   RETROPHARYNGEAL   . TONSILLECTOMY AND ADENOIDECTOMY N/A 03/15/2019   Procedure: IRRIGATION AND DEBRIDEMENT OF RETROPHARYNGEAL ABSCESS;  Surgeon: Leta Baptist, MD;  Location: [facility] OR;  Service: ENT;  Laterality: N/A;     FAMILY HISTORY:  Family History  Problem Relation Age of Onset  . Lung cancer Mother   . Hypertension Mother   . Leukemia Father   . Myelodysplastic syndrome Father      SOCIAL HISTORY:  reports that she has been smoking cigarettes. She has a 25.00 pack-year smoking history. She has never used smokeless tobacco. She reports previous alcohol use. She reports previous drug use.  The patient is widowed.  She has a son who lives with her.  Prior  to her illness she was working as a Occupational psychologist at [employer].   ALLERGIES: Patient has no known allergies.   MEDICATIONS:  Current Outpatient Medications  Medication Sig Dispense Refill  . apixaban (ELIQUIS) 5 MG TABS tablet Take 1 tablet (5 mg total) by mouth 2 (two) times daily. 60 tablet 5  . dexamethasone (DECADRON) 4 MG tablet Take 1 tablet (4 mg total) by mouth 3  (three) times daily. 90 tablet 1  . gabapentin (NEURONTIN) 300 MG capsule Take 1 capsule (300 mg total) by mouth 3 (three) times daily. Take 300 mg in the morning, 300 mg at noon and 600 mg at bedtime. (Patient taking differently: Take 300 mg by mouth 2 (two) times daily as needed. ) 120 capsule 2  . methocarbamol (ROBAXIN) 750 MG tablet Take 1 tablet (750 mg total) by mouth 3 (three) times daily. 90 tablet 1  . Multiple Vitamin (MULTIVITAMIN WITH MINERALS) TABS tablet Take 1 tablet by mouth daily. 120 tablet 2  . Nebulizers (COMPRESSOR/NEBULIZER) MISC 1 Units by Does not apply route as needed. Nebulizer treatments as needed for shortness of breath cough and wheezing 1 each 0  . oxyCODONE (OXY IR/ROXICODONE) 5 MG immediate release tablet Take 1 tablet (5 mg total) by mouth every 4 (four) hours as needed for severe pain. 90 tablet 0  . prochlorperazine (COMPAZINE) 10 MG tablet Take 1 tablet (10 mg total) by mouth every 6 (six) hours as needed (Nausea or vomiting). 30 tablet 1  . tiotropium (SPIRIVA HANDIHALER) 18 MCG inhalation capsule Place 1 capsule (18 mcg total) into inhaler and inhale daily. 30 capsule 2  . albuterol (PROVENTIL) (2.5 MG/3ML) 0.083% nebulizer solution Take 3 mLs (2.5 mg total) by nebulization every 2 (two) hours as needed for wheezing. (Patient not taking: Reported on 05/09/2020) 75 mL 12  . albuterol (VENTOLIN HFA) 108 (90 Base) MCG/ACT inhaler Inhale 2 puffs into the lungs every 6 (six) hours as needed for wheezing or shortness of breath. (Patient not taking: Reported on 05/09/2020) 18 g 3   No current facility-administered medications for this encounter.     REVIEW OF SYSTEMS: On review of systems the patient reports that she is doing pretty well overall.  She states that she had noticed some weakness in the right side particularly her right upper extremity to where she was having a lot of numbness in the extremity and weakness with motor function, she also was dropping a lot of  items that she was trying to hold.  The patient is trying to use her left hand and left side to do most of her activities, but since being started on dexamethasone 4 mg 3 times a day, her symptoms have improved, she is still having a hard time being able to dress herself because of the weakness.  She has been trying to find humor in these moments.  She reports that her appetite has increased with the steroids, she is not taking anything to help protect her stomach, but is willing to start.  She has not been having significant headaches or nausea or vomiting.  No other complaints are verbalized.     PHYSICAL EXAM:  Unable to record vitals at this time due to encounter type. In general this is a well appearing Caucasian female in no acute distress.  She's alert and oriented x4 and appropriate throughout the examination. Cardiopulmonary assessment is negative for acute distress and she exhibits normal effort.     ECOG = 1  0 - Asymptomatic (  Fully active, able to carry on all predisease activities without restriction)  1 - Symptomatic but completely ambulatory (Restricted in physically strenuous activity but ambulatory and able to carry out work of a light or sedentary nature. For example, light housework, office work)  2 - Symptomatic, <50% in bed during the day (Ambulatory and capable of all self care but unable to carry out any work activities. Up and about more than 50% of waking hours)  3 - Symptomatic, >50% in bed, but not bedbound (Capable of only limited self-care, confined to bed or chair 50% or more of waking hours)  4 - Bedbound (Completely disabled. Cannot carry on any self-care. Totally confined to bed or chair)  5 - Death   Eustace Pen MM, Creech RH, Tormey DC, et al. [phone]). "Toxicity and response criteria of the [facility] Group". [place] Oncol. 5 (6): 649-55    LABORATORY DATA:  Lab Results  Component Value Date   WBC 7.7 05/02/2020   HGB 15.2 (H)  05/02/2020   HCT 47.8 (H) 05/02/2020   MCV 95.2 05/02/2020   PLT 246 05/02/2020   Lab Results  Component Value Date   NA 138 05/02/2020   K 4.2 05/02/2020   CL 102 05/02/2020   CO2 27 05/02/2020   Lab Results  Component Value Date   ALT 30 05/02/2020   AST 14 (L) 05/02/2020   ALKPHOS 69 05/02/2020   BILITOT 0.4 05/02/2020      RADIOGRAPHY: MR Brain W Wo Contrast  Result Date: 05/09/2020 CLINICAL DATA:  Lung cancer, follow-up EXAM: MRI HEAD WITHOUT AND WITH CONTRAST TECHNIQUE: Multiplanar, multiecho pulse sequences of the brain and surrounding structures were obtained without and with intravenous contrast. CONTRAST:  26mL MULTIHANCE GADOBENATE DIMEGLUMINE 529 MG/ML IV SOLN COMPARISON:  04/26/2020 FINDINGS: Significant motion artifact. Brain: Comparison is limited. Previously identified 3 left cerebral lesions are similar in size. Associated left cerebral edema is mildly increased. No definite new lesion. There is no acute infarction. Similar partial effacement of the left lateral ventricle. No midline shift. No hydrocephalus or extra-axial fluid collection. Vascular: Major vessel flow voids at the skull base are preserved. Skull and upper cervical spine: Normal marrow signal is preserved. Sinuses/Orbits: Minor mucosal thickening.  Orbits are unremarkable. Other: Sella is unremarkable.  Mastoid air cells are clear. IMPRESSION: Significantly motion degraded study with limited measurement comparison. The three previously identified left cerebral lesions appear similar in size. Associated left cerebral edema is mildly increased without significant change in mass effect. Electronically Signed   By: Macy Mis M.D.   On: 05/09/2020 16:40   MR Brain W Wo Contrast  Result Date: 04/27/2020 CLINICAL DATA:  Lung cancer. EXAM: MRI HEAD WITHOUT AND WITH CONTRAST TECHNIQUE: Multiplanar, multiecho pulse sequences of the brain and surrounding structures were obtained without and with intravenous  contrast. CONTRAST:  19mL GADAVIST GADOBUTROL 1 MMOL/ML IV SOLN COMPARISON:  02/03/2020 MRI head. FINDINGS: Brain: Interval increase in intracranial metastases including the following reference lesions: - Previously visualized left parietal lesion measures 2.0 x 1.6 cm (10:36, previously 0.4 cm). - New dorsal left frontal enhancing lesion measures 1.2 x 0.9 cm (10:39). - Inferior to this there is a 1.7 x 1.5 cm enhancing dorsal left frontal/superior temporal lesion (10:29). All of the aforementioned lesions demonstrate correlative restricted diffusion, T2/FLAIR hyperintense signal and peri-lesional edema. No midline shift, ventriculomegaly or extra-axial fluid collection. SWI signal dropout involving the more superior frontal lesion (7:78) likely reflects intralesional hemorrhage. Vascular: Normal flow voids.  Skull and upper cervical spine: Normal marrow signal. Sinuses/Orbits: Normal orbits. Left maxillary sinus mucous retention cyst. Trace right mastoid effusion. Other: None. IMPRESSION: Increased size and number of intracranial metastases with new left frontal/superior temporal lesions. Electronically Signed   By: Primitivo Gauze M.D.   On: 04/27/2020 09:21       IMPRESSION/PLAN: 1. 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  Dr. Lisbeth Renshaw is aware of her case and recommended proceeding with stereotactic radiosurgery in the work-up to be able to plan for this.  We reviewed her MRI results today.  Unfortunately the images are quite distorted because of the motion artifact.  Physics is going to try and review her films, but the patient is aware that she may need another 3T MRI in order to have better planning technique.  We did discuss the rationale and the delivery as well as logistics of SRS treatment, and we discussed the risks, benefits, short and long-term effects of treatment and the patient is interested in proceeding.  She will come in this afternoon for simulation with IV  contrast, to make a mask, and is scheduled to meet next week with Dr. Marcello Moores in neurosurgery which we outlined the rationale for.  We also discussed the the course would likely be single fraction though Dr. Lisbeth Renshaw may recommend fractionation and she is aware of this as well.  Tentatively she is scheduled to proceed with a single fraction next Friday.  Written consent will be obtained today when she comes for simulation. 2. Contraceptive counseling.  The patient is not sexually active at this time, she assures me that she has not been for quite some time, nor does she plan to be during treatment.  We can forego urine pregnancy testing, but she is aware that if she wanted to become sexually active prior to receiving the radiation we are planning to give, she needs to let us know so that we can guide her in contraception.  This encounter was provided by telemedicine platform MyChart.  The patient has provided two factor identification and has given verbal consent for this type of encounter and has been advised to only accept a meeting of this type in a secure network environment. The time spent during this encounter was 45 minutes including preparation, discussion, and coordination of the patient's care. The attendants for this meeting include Mont Dutton, RT,  Hayden Pedro  and Kathalene Frames.  During the encounter,  Mont Dutton, RT, and Hayden Pedro were located at [facility] Radiation Oncology Department.  Laysa Kimmey was located at home.      Carola Rhine, PAC

## 2020-05-16 ENCOUNTER — Inpatient Hospital Stay (HOSPITAL_COMMUNITY): Payer: BC Managed Care – PPO

## 2020-05-16 ENCOUNTER — Inpatient Hospital Stay (HOSPITAL_BASED_OUTPATIENT_CLINIC_OR_DEPARTMENT_OTHER): Payer: BC Managed Care – PPO | Admitting: Hematology

## 2020-05-16 ENCOUNTER — Other Ambulatory Visit: Payer: Self-pay

## 2020-05-16 VITALS — BP 138/79 | HR 119 | Temp 96.8°F | Resp 20 | Wt 295.2 lb

## 2020-05-16 DIAGNOSIS — C7931 Secondary malignant neoplasm of brain: Secondary | ICD-10-CM | POA: Diagnosis not present

## 2020-05-16 DIAGNOSIS — C3490 Malignant neoplasm of unspecified part of unspecified bronchus or lung: Secondary | ICD-10-CM

## 2020-05-16 LAB — COMPREHENSIVE METABOLIC PANEL
ALT: 632 U/L — ABNORMAL HIGH (ref 0–44)
AST: 117 U/L — ABNORMAL HIGH (ref 15–41)
Albumin: 3.6 g/dL (ref 3.5–5.0)
Alkaline Phosphatase: 88 U/L (ref 38–126)
Anion gap: 9 (ref 5–15)
BUN: 29 mg/dL — ABNORMAL HIGH (ref 6–20)
CO2: 30 mmol/L (ref 22–32)
Calcium: 8.6 mg/dL — ABNORMAL LOW (ref 8.9–10.3)
Chloride: 98 mmol/L (ref 98–111)
Creatinine, Ser: 1.22 mg/dL — ABNORMAL HIGH (ref 0.44–1.00)
GFR calc Af Amer: >60 mL/min (ref >60)
GFR calc non Af Amer: 55 mL/min — ABNORMAL LOW (ref >60)
Glucose, Bld: 223 mg/dL — ABNORMAL HIGH (ref 70–99)
Potassium: 4.5 mmol/L (ref 3.5–5.1)
Sodium: 137 mmol/L (ref 135–145)
Total Bilirubin: 1.2 mg/dL (ref 0.3–1.2)
Total Protein: 6.6 g/dL (ref 6.5–8.1)

## 2020-05-16 LAB — CBC WITH DIFFERENTIAL/PLATELET
Abs Immature Granulocytes: 0.39 10*3/uL — ABNORMAL HIGH (ref 0.00–0.07)
Basophils Absolute: 0.1 10*3/uL (ref 0.0–0.1)
Basophils Relative: 0 %
Eosinophils Absolute: 0 10*3/uL (ref 0.0–0.5)
Eosinophils Relative: 0 %
HCT: 51.3 % — ABNORMAL HIGH (ref 36.0–46.0)
Hemoglobin: 16.5 g/dL — ABNORMAL HIGH (ref 12.0–15.0)
Immature Granulocytes: 2 %
Lymphocytes Relative: 5 %
Lymphs Abs: 1 10*3/uL (ref 0.7–4.0)
MCH: 30.5 pg (ref 26.0–34.0)
MCHC: 32.2 g/dL (ref 30.0–36.0)
MCV: 94.8 fL (ref 80.0–100.0)
Monocytes Absolute: 1.2 10*3/uL — ABNORMAL HIGH (ref 0.1–1.0)
Monocytes Relative: 6 %
Neutro Abs: 18.4 10*3/uL — ABNORMAL HIGH (ref 1.7–7.7)
Neutrophils Relative %: 87 %
Platelets: 207 10*3/uL (ref 150–400)
RBC: 5.41 MIL/uL — ABNORMAL HIGH (ref 3.87–5.11)
RDW: 15 % (ref 11.5–15.5)
WBC: 21 10*3/uL — ABNORMAL HIGH (ref 4.0–10.5)
nRBC: 0 % (ref 0.0–0.2)

## 2020-05-16 LAB — TSH: TSH: 5.355 u[IU]/mL — ABNORMAL HIGH (ref 0.350–4.500)

## 2020-05-16 LAB — MAGNESIUM: Magnesium: 2.1 mg/dL (ref 1.7–2.4)

## 2020-05-16 NOTE — Patient Instructions (Signed)
[place] at [facility] Discharge Instructions  You were seen today by Dr. Delton Coombes. He went over your recent results. You did not receive your treatment today. You will be scheduled for a CT scan of your abdomen. Dr. Delton Coombes will see you back after your scan for labs and follow up.   Thank you for choosing [place] at [facility] to provide your oncology and hematology care.  To afford each patient quality time with our provider, please arrive at least 15 minutes before your scheduled appointment time.   If you have a lab appointment with the [facility] please come in thru the Main Entrance and check in at the main information desk  You need to re-schedule your appointment should you arrive 10 or more minutes late.  We strive to give you quality time with our providers, and arriving late affects you and other patients whose appointments are after yours.  Also, if you no show three or more times for appointments you may be dismissed from the clinic at the providers discretion.     Again, thank you for choosing [facility].  Our hope is that these requests will decrease the amount of time that you wait before being seen by our physicians.       _____________________________________________________________  Should you have questions after your visit to [facility], please contact our office at [phone] between the hours of 8:00 a.m. and 4:30 p.m.  Voicemails left after 4:00 p.m. will not be returned until the following business day.  For prescription refill requests, have your pharmacy contact our office and allow 72 hours.    Cancer Center Support Programs:   > Cancer Support Group  2nd Tuesday of the month 1pm-2pm, Journey Room

## 2020-05-16 NOTE — Progress Notes (Signed)
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  PRIOR THERAPY: Carboplatin, paclitaxel and Keytruda x 7 cycles from 04/08/2019 to 08/16/2019  NGS Results: Not done  CURRENT THERAPY: Keytruda every 3 weeks  BRIEF ONCOLOGIC HISTORY:  Oncology History  Squamous cell lung cancer, unspecified laterality ([place])  04/05/2019 Initial Diagnosis   Squamous cell lung cancer, unspecified laterality ([place])   04/08/2019 -  Chemotherapy   The patient had palonosetron (ALOXI) injection 0.25 mg, 0.25 mg, Intravenous,  Once, 7 of 7 cycles Administration: 0.25 mg (04/08/2019), 0.25 mg (05/02/2019), 0.25 mg (05/23/2019), 0.25 mg (06/13/2019), 0.25 mg (07/05/2019), 0.25 mg (07/26/2019), 0.25 mg (08/16/2019) pegfilgrastim (NEULASTA) injection 6 mg, 6 mg, Subcutaneous, Once, 2 of 2 cycles Administration: 6 mg (04/11/2019), 6 mg (05/04/2019) pegfilgrastim (NEULASTA ONPRO KIT) injection 6 mg, 6 mg, Subcutaneous, Once, 3 of 3 cycles pegfilgrastim-cbqv (UDENYCA) injection 6 mg, 6 mg, Subcutaneous, Once, 5 of 5 cycles Administration: 6 mg (05/25/2019), 6 mg (06/15/2019), 6 mg (07/07/2019), 6 mg (07/28/2019), 6 mg (08/18/2019) CARBOplatin (PARAPLATIN) 900 mg in sodium chloride 0.9 % 500 mL chemo infusion, 900 mg (100 % of original dose 900 mg), Intravenous,  Once, 7 of 7 cycles Dose modification:   (original dose 900 mg, Cycle 1),   (original dose 900 mg, Cycle 2),   (original dose 900 mg, Cycle 3),   (original dose 900 mg, Cycle 4) Administration: 900 mg (04/08/2019), 900 mg (05/02/2019), 900 mg (05/23/2019), 900 mg (06/13/2019), 900 mg (07/05/2019), 900 mg (07/26/2019), 900 mg (08/16/2019) PACLitaxel (TAXOL) 492 mg in sodium chloride 0.9 % 500 mL chemo infusion (> 38m/m2), 200 mg/m2 = 492 mg, Intravenous,  Once, 7 of 7  cycles Dose modification: 175 mg/m2 (original dose 200 mg/m2, Cycle 6, Reason: Other (see comments), Comment: neuropathy) Administration: 492 mg (04/08/2019), 492 mg (05/02/2019), 492 mg (05/23/2019), 492 mg (06/13/2019), 492 mg (07/05/2019), 432 mg (07/26/2019), 432 mg (08/16/2019) pembrolizumab (KEYTRUDA) 200 mg in sodium chloride 0.9 % 50 mL chemo infusion, 200 mg, Intravenous, Once, 18 of 19 cycles Administration: 200 mg (04/08/2019), 200 mg (07/05/2019), 200 mg (05/02/2019), 200 mg (05/23/2019), 200 mg (06/13/2019), 200 mg (07/26/2019), 200 mg (08/16/2019), 200 mg (09/06/2019), 200 mg (09/27/2019), 200 mg (10/19/2019), 200 mg (11/09/2019), 200 mg (11/30/2019), 200 mg (12/21/2019), 200 mg (01/12/2020), 200 mg (02/07/2020), 200 mg (02/28/2020), 200 mg (03/21/2020), 200 mg (04/11/2020) fosaprepitant (EMEND) 150 mg, dexamethasone (DECADRON) 12 mg in sodium chloride 0.9 % 145 mL IVPB, , Intravenous,  Once, 7 of 7 cycles Administration:  (04/08/2019),  (05/02/2019),  (05/23/2019),  (06/13/2019),  (07/05/2019),  (07/26/2019),  (08/16/2019)  for chemotherapy treatment.      CANCER STAGING: Cancer Staging No matching staging information was found for the patient.  INTERVAL HISTORY:  Tara Aguilar a 39y.o. female, returns for routine follow-up and consideration for next cycle of chemotherapy. [place] last seen on 05/02/2020.  Due for cycle #19 of Keytruda today.   Today she reports that she will start the radiation therapy on 05/18/2020. Her hands are doing much better with the dexamethasone. She is not dropping things anymore. Her SOB is stable and coughing a little bit; she sometimes brings up white sputum with her coughs. Her appetite has increased and she has been cooking meals almost every night.  Overall, she feels ready for next cycle of chemo today.    REVIEW OF SYSTEMS:  Review of Systems  Constitutional: Negative for appetite change and fatigue.  Respiratory: Positive for cough and shortness of breath.     Gastrointestinal: Negative for abdominal pain.  Neurological: Positive for dizziness.  Psychiatric/Behavioral: Positive for sleep disturbance.  All other systems reviewed and are negative.   PAST MEDICAL/SURGICAL HISTORY:  Past Medical History:  Diagnosis Date  . Cancer ([place])   . Depression   . Glucose intolerance 03/11/2019  . Obesity, Class III, BMI 40-49.9 (morbid obesity) ([place]) 03/11/2019   Past Surgical History:  Procedure Laterality Date  . CESAREAN SECTION    . IRRIGATION AND DEBRIDEMENT ABSCESS  03/15/2019   RETROPHARYNGEAL   . TONSILLECTOMY AND ADENOIDECTOMY N/A 03/15/2019   Procedure: IRRIGATION AND DEBRIDEMENT OF RETROPHARYNGEAL ABSCESS;  Surgeon: Leta Baptist, MD;  Location: [facility];  Service: ENT;  Laterality: N/A;    SOCIAL HISTORY:  Social History   Socioeconomic History  . Marital status: Widowed    Spouse name: Not on file  . Number of children: 1  . Years of education: Not on file  . Highest education level: Not on file  Occupational History  . Not on file  Tobacco Use  . Smoking status: Current Some Day Smoker    Packs/day: 1.00    Years: 25.00    Pack years: 25.00    Types: Cigarettes  . Smokeless tobacco: Never Used  Vaping Use  . Vaping Use: Never used  Substance and Sexual Activity  . Alcohol use: Not Currently  . Drug use: Not Currently  . Sexual activity: Not on file  Other Topics Concern  . Not on file  Social History Narrative   03-15-2019   Husband passed away four months ago from Cancer of the [place].   Pt. Has 97 yo son at home.   Social Determinants of Health   Financial Resource Strain:   . Difficulty of Paying Living Expenses:   Food Insecurity:   . Worried About Charity fundraiser in the Last Year:   . Arboriculturist in the Last Year:   Transportation Needs:   . Film/video editor (Medical):   [name] Lack of Transportation (Non-Medical):   Physical Activity:   . Days of Exercise per Week:   . Minutes of Exercise per Session:    Stress:   . Feeling of Stress :   Social Connections:   . Frequency of Communication with Friends and Family:   . Frequency of Social Gatherings with Friends and Family:   . Attends Religious Services:   . Active Member of Clubs or Organizations:   . Attends Archivist Meetings:   [name] Marital Status:   Intimate Partner Violence:   . Fear of Current or Ex-Partner:   . Emotionally Abused:   [name] Physically Abused:   . Sexually Abused:     FAMILY HISTORY:  Family History  Problem Relation Age of Onset  . Lung cancer Mother   . Hypertension Mother   . Leukemia Father   . Myelodysplastic syndrome Father     CURRENT MEDICATIONS:  Current Outpatient Medications  Medication Sig Dispense Refill  . apixaban (ELIQUIS) 5 MG TABS tablet Take 1 tablet (5 mg total) by mouth 2 (two) times daily. 60 tablet 5  . dexamethasone (DECADRON) 4 MG tablet Take 1 tablet (4 mg total) by mouth 3 (three) times daily. 90 tablet 1  . gabapentin (  NEURONTIN) 300 MG capsule Take 1 capsule (300 mg total) by mouth 3 (three) times daily. Take 300 mg in the morning, 300 mg at noon and 600 mg at bedtime. (Patient taking differently: Take 300 mg by mouth 2 (two) times daily as needed. ) 120 capsule 2  . methocarbamol (ROBAXIN) 750 MG tablet Take 1 tablet (750 mg total) by mouth 3 (three) times daily. 90 tablet 1  . Multiple Vitamin (MULTIVITAMIN WITH MINERALS) TABS tablet Take 1 tablet by mouth daily. 120 tablet 2  . Nebulizers (COMPRESSOR/NEBULIZER) MISC 1 Units by Does not apply route as needed. Nebulizer treatments as needed for shortness of breath cough and wheezing 1 each 0  . oxyCODONE (OXY IR/ROXICODONE) 5 MG immediate release tablet Take 1 tablet (5 mg total) by mouth every 4 (four) hours as needed for severe pain. 90 tablet 0  . tiotropium (SPIRIVA HANDIHALER) 18 MCG inhalation capsule Place 1 capsule (18 mcg total) into inhaler and inhale daily. 30 capsule 2  . albuterol (PROVENTIL) (2.5 MG/3ML) 0.083%  nebulizer solution Take 3 mLs (2.5 mg total) by nebulization every 2 (two) hours as needed for wheezing. (Patient not taking: Reported on 05/16/2020) 75 mL 12  . albuterol (VENTOLIN HFA) 108 (90 Base) MCG/ACT inhaler Inhale 2 puffs into the lungs every 6 (six) hours as needed for wheezing or shortness of breath. (Patient not taking: Reported on 05/16/2020) 18 g 3  . prochlorperazine (COMPAZINE) 10 MG tablet Take 1 tablet (10 mg total) by mouth every 6 (six) hours as needed (Nausea or vomiting). (Patient not taking: Reported on 05/16/2020) 30 tablet 1   No current facility-administered medications for this visit.    ALLERGIES:  No Known Allergies  PHYSICAL EXAM:  Performance status (ECOG): 1 - Symptomatic but completely ambulatory  Vitals:   05/16/20 1301  BP: 138/79  Pulse: (!) 119  Resp: 20  Temp: (!) 96.8 F (36 C)  SpO2: 95%   Wt Readings from Last 3 Encounters:  05/16/20 295 lb 3.2 oz (133.9 kg)  05/02/20 299 lb 14.4 oz (136 kg)  04/11/20 299 lb (135.6 kg)   Physical Exam Vitals reviewed.  Constitutional:      Appearance: Normal appearance. She is obese.  Cardiovascular:     Rate and Rhythm: Normal rate and regular rhythm.     Pulses: Normal pulses.     Heart sounds: Normal heart sounds.  Pulmonary:     Effort: Pulmonary effort is normal.     Breath sounds: Normal breath sounds.  Neurological:     General: No focal deficit present.     Mental Status: She is alert and oriented to person, place, and time.  Psychiatric:        Mood and Affect: Mood normal.        Behavior: Behavior normal.     LABORATORY DATA:  I have reviewed the labs as listed.  CBC Latest Ref Rng & Units 05/16/2020 05/02/2020 04/11/2020  WBC 4.0 - 10.5 K/uL 21.0(H) 7.7 9.4  Hemoglobin 12.0 - 15.0 g/dL 16.5(H) 15.2(H) 14.0  Hematocrit 36 - 46 % 51.3(H) 47.8(H) 45.0  Platelets 150 - 400 K/uL 207 246 238   CMP Latest Ref Rng & Units 05/16/2020 05/02/2020 04/11/2020  Glucose 70 - 99 mg/dL 223(H) 119(H)  105(H)  BUN 6 - 20 mg/dL 29(H) 16 19  Creatinine 0.44 - 1.00 mg/dL 1.22(H) 0.90 0.99  Sodium 135 - 145 mmol/L 137 138 138  Potassium 3.5 - 5.1 mmol/L 4.5 4.2 4.2  Chloride  98 - 111 mmol/L 98 102 102  CO2 22 - 32 mmol/L _0 Calcium 8.9 - 10.3 mg/dL 8.6(L) 9.1 9.2  Total Protein 6.5 - 8.1 g/dL 6.6 7.5 7.5  Total Bilirubin 0.3 - 1.2 mg/dL 1.2 0.4 0.5  Alkaline Phos 38 - 126 U/L 88 69 59  AST 15 - 41 U/L 117(H) 14(L) 18  ALT 0 - 44 U/L 632(H) 30 27    DIAGNOSTIC IMAGING:  I have independently reviewed the scans and discussed with the patient. MR Brain W Wo Contrast  Addendum Date: 05/11/2020   ADDENDUM REPORT: 05/11/2020 14:46 ADDENDUM: Repeat postcontrast imaging was performed. Left frontal lesion on series 4, image 118 measures 1 x 0.9 cm (previously 1.2 x 0.9 cm) Left parietal lesion on series 4, image 106 measures 2 x 2 cm (previously 2 x 2 cm) Left superior temporal lesion on series 4, image 87 measures 1.7 x 1.7 cm (previously 1.7 x 1.5 cm) No new mass or abnormal enhancement. Electronically Signed   By: Macy Mis M.D.   On: 05/11/2020 14:46   Result Date: 05/11/2020 CLINICAL DATA:  Lung cancer, follow-up EXAM: MRI HEAD WITHOUT AND WITH CONTRAST TECHNIQUE: Multiplanar, multiecho pulse sequences of the brain and surrounding structures were obtained without and with intravenous contrast. CONTRAST:  104m MULTIHANCE GADOBENATE DIMEGLUMINE 529 MG/ML IV SOLN COMPARISON:  04/26/2020 FINDINGS: Significant motion artifact. Brain: Comparison is limited. Previously identified 3 left cerebral lesions are similar in size. Associated left cerebral edema is mildly increased. No definite new lesion. There is no acute infarction. Similar partial effacement of the left lateral ventricle. No midline shift. No hydrocephalus or extra-axial fluid collection. Vascular: Major vessel flow voids at the skull base are preserved. Skull and upper cervical spine: Normal marrow signal is preserved.  Sinuses/Orbits: Minor mucosal thickening.  Orbits are unremarkable. Other: Sella is unremarkable.  Mastoid air cells are clear. IMPRESSION: Significantly motion degraded study with limited measurement comparison. The three previously identified left cerebral lesions appear similar in size. Associated left cerebral edema is mildly increased without significant change in mass effect. Electronically Signed: By: PMacy MisM.D. On: 05/09/2020 16:40   MR Brain W Wo Contrast  Result Date: 04/27/2020 CLINICAL DATA:  Lung cancer. EXAM: MRI HEAD WITHOUT AND WITH CONTRAST TECHNIQUE: Multiplanar, multiecho pulse sequences of the brain and surrounding structures were obtained without and with intravenous contrast. CONTRAST:  184mGADAVIST GADOBUTROL 1 MMOL/ML IV SOLN COMPARISON:  02/03/2020 MRI head. FINDINGS: Brain: Interval increase in intracranial metastases including the following reference lesions: - Previously visualized left parietal lesion measures 2.0 x 1.6 cm (10:36, previously 0.4 cm). - New dorsal left frontal enhancing lesion measures 1.2 x 0.9 cm (10:39). - Inferior to this there is a 1.7 x 1.5 cm enhancing dorsal left frontal/superior temporal lesion (10:29). All of the aforementioned lesions demonstrate correlative restricted diffusion, T2/FLAIR hyperintense signal and peri-lesional edema. No midline shift, ventriculomegaly or extra-axial fluid collection. SWI signal dropout involving the more superior frontal lesion (7:78) likely reflects intralesional hemorrhage. Vascular: Normal flow voids. Skull and upper cervical spine: Normal marrow signal. Sinuses/Orbits: Normal orbits. Left maxillary sinus mucous retention cyst. Trace right mastoid effusion. Other: None. IMPRESSION: Increased size and number of intracranial metastases with new left frontal/superior temporal lesions. Electronically Signed   By: ChPrimitivo Gauze.D.   On: 04/27/2020 09:21     ASSESSMENT:  1. Metastatic squamous cell  carcinoma of the lung to the adrenals: -7 cycles of carboplatin, paclitaxel and pembrolizumab from  04/08/2019 through 08/16/2019. -Maintenance pembrolizumab started on 09/06/2019. -MRI brain from 02/03/2020 showed 4 mm left parietal nodular enhancement. Differential includes metastatic versus subacute infarction. -CT CAP on 02/03/2020 showed new nodularity in the bilateral upper lungs measuring 6 mm. Ill-defined soft tissue in the medial right supraclavicular region and right paratracheal region stable versus mildly improved. Stable mild axillary lymphadenopathy, right greater than left. No suspicious findings in the abdomen or pelvis. -CT chest with contrast on 03/28/2020 showed subpleural groundglass opacity in the left lower lobe consistent with atelectasis/infection. Bilateral micronodularity, mostly in the upper part of the lungs progressive from 2/0/9470.  2. Left basilic vein thrombus: -Ultrasound on 05/03/2019 showed nonocclusive thrombus around the PICC line in the left basilic vein considered superficial thrombus with no evidence of DVT. -Because of her metastatic cancer, she was told to continue Eliquis.  3.  Brain metastasis: -MRI of the brain on 04/26/2020 showed left parietal lesion measuring 2.0 x 1.6 cm.  Left frontal enhancing lesion measures 1.2 x 0.9 cm.  1.7 x 1 point centimeters enhancing dorsal left frontal/superior temporal lesion present.   PLAN:  1. Metastatic squamous cell carcinoma of the lung: -She was evaluated by radiation oncology and is being planned for SRS this Friday. -She is continuing dexamethasone 4 mg 3 times daily.  Her strength in the right upper extremity has returned back to normal.  She is also feeling very energetic and is able to do all her activities easily. -I have reviewed her labs today which showed AST of 117 and ALT of 632.  Total bilirubin also gone up from baseline at 1.2.  Creatinine has mildly increased to 1.22.  She denies any new  medications. -I have recommended imaging of the abdomen with CT of the abdomen and pelvis.  I will see her back after the CT scans.  We will continue to hold pembrolizumab at this time.  2. Peripheral neuropathy: -Continue gabapentin 300 mg twice daily.  3. Left basilic vein thrombus: -Continue Eliquis.  She is tolerating without any problems.  4. Thyroid abnormality: -TSH today is slightly high at 5.35.  We will closely monitor.   Orders placed this encounter:  No orders of the defined types were placed in this encounter.    Derek Jack, MD [place] [phone]   I, Milinda Antis, am acting as a scribe for Dr. Sanda Linger.  I, Derek Jack MD, have reviewed the above documentation for accuracy and completeness, and I agree with the above.

## 2020-05-16 NOTE — Progress Notes (Signed)
Patient has been assessed, vital signs and labs have been reviewed by Dr. Delton Coombes. HOLD treatment today due to labs.

## 2020-05-16 NOTE — Progress Notes (Signed)
Patient presents today for treatment and follow up visit with Dr. Delton Coombes. ALT 632, AST 117.   Message received from [facility] LPN/ Dr. Melanie Crazier treatment today due to labs per note.

## 2020-05-18 ENCOUNTER — Ambulatory Visit
Admission: RE | Admit: 2020-05-18 | Discharge: 2020-05-18 | Disposition: A | Discharge status: Home / Self Care | Payer: BC Managed Care – PPO | Source: Ambulatory Visit | Attending: Radiation Oncology | Admitting: Radiation Oncology

## 2020-05-18 ENCOUNTER — Other Ambulatory Visit: Payer: Self-pay

## 2020-05-18 ENCOUNTER — Encounter (HOSPITAL_COMMUNITY): Payer: Self-pay

## 2020-05-18 DIAGNOSIS — C7931 Secondary malignant neoplasm of brain: Secondary | ICD-10-CM | POA: Diagnosis not present

## 2020-05-18 NOTE — Op Note (Signed)
Name: Tara Aguilar    MRN: [id number]   Date: 05/18/2020    DOB: Feb 25, 1981   STEREOTACTIC RADIOSURGERY OPERATIVE NOTE  PRE-OPERATIVE DIAGNOSIS:  Metastatic brain disease  POST-OPERATIVE DIAGNOSIS:  Metastatic brain disease  PROCEDURE:  Stereotactic Radiosurgery using TrueBeam Linac device to 3 left-sided lesions  SURGEON:  Duffy Rhody, MD  RADIATION ONCOLOGIST:  Dr. Lisbeth Renshaw  TECHNIQUE:  The patient underwent a radiation treatment planning session in the radiation oncology simulation suite under the care of the radiation oncology physician and physicist.  I participated closely in the radiation treatment planning afterwards. The patient underwent planning CT which was fused to 3T high resolution MRI with 1 mm axial slices.  These images were fused on the planning system.  We contoured the gross target volumes and subsequently expanded this by 1 mm to yield the Planning Target Volume. I actively participated in the planning process.  I helped to define and review the target contours and also the contours of the optic pathway, eyes, brainstem and selected nearby organs at risk.  All the dose constraints for critical structures were reviewed and compared to AAPM Task Group 101.  The prescription dose conformity was reviewed.  I approved the plan electronically.    Accordingly, Tara Aguilar  was brought to the TrueBeam stereotactic radiation treatment linac and placed in the custom immobilization mask.  The patient was aligned according to the IR fiducial markers with BrainLab Exactrac, then orthogonal x-rays were used in ExacTrac with the 6DOF robotic table and the shifts were made to align the patient  The plan was to deliver 27 Gy in 3 fractions.  She received the first fraction (9 Gy) uneventfully on 7/23, with the second and third fraction following in the subsequent days in a similar manner.   The detailed description of the procedure is recorded in the radiation oncology procedure note.  I  was present for the duration of the procedure.  DISPOSITION:   Following delivery, the patient was transported to nursing in stable condition and monitored for possible acute effects to be discharged to home in stable condition with follow-up in one month.  Duffy Rhody, MD [facility]

## 2020-05-18 NOTE — Progress Notes (Signed)
Mrs. Orchard rested with [place] for 15 minutes following her [place] treatment.  Patient denies headache, dizziness, nausea, diplopia or ringing in the ears. Denies fatigue. Patient without complaints. Understands to avoid strenuous activity for the next 24 hours and call [phone] with needs.    BP (!) 124/91 (BP Location: Left Wrist, Patient Position: Sitting, Cuff Size: Normal)   Pulse (!) 115   Temp 98.5 F (36.9 C)   Resp 20   SpO2 94%   Albert Hersch M. Leonie Green, BSN

## 2020-05-22 ENCOUNTER — Ambulatory Visit
Admission: RE | Admit: 2020-05-22 | Discharge: 2020-05-22 | Disposition: A | Discharge status: Home / Self Care | Payer: BC Managed Care – PPO | Source: Ambulatory Visit | Attending: Radiation Oncology | Admitting: Radiation Oncology

## 2020-05-22 ENCOUNTER — Other Ambulatory Visit: Payer: Self-pay

## 2020-05-22 DIAGNOSIS — C7931 Secondary malignant neoplasm of brain: Secondary | ICD-10-CM | POA: Diagnosis not present

## 2020-05-24 ENCOUNTER — Other Ambulatory Visit: Payer: Self-pay

## 2020-05-24 ENCOUNTER — Encounter: Payer: Self-pay | Admitting: Radiation Oncology

## 2020-05-24 ENCOUNTER — Ambulatory Visit
Admission: RE | Admit: 2020-05-24 | Discharge: 2020-05-24 | Disposition: A | Discharge status: Home / Self Care | Payer: BC Managed Care – PPO | Source: Ambulatory Visit | Attending: Radiation Oncology | Admitting: Radiation Oncology

## 2020-05-24 DIAGNOSIS — C7931 Secondary malignant neoplasm of brain: Secondary | ICD-10-CM | POA: Diagnosis not present

## 2020-05-24 NOTE — Progress Notes (Signed)
Mrs. Tacey rested with [place] for 15 minutes following her final SRS treatment.  Patient denies headache, dizziness, nausea, diplopia or ringing in the ears. Denies fatigue. Patient without complaints. Understands to avoid strenuous activity for the next 24 hours and call [phone] with needs. Steroid taper instructions reviewed and patient knows to call if her current supply runs low and she she needs a refill to complete regimen. Ambulated out of clinic without incident.  Vitals:   05/24/20 0900  BP: (!) 143/97  Pulse: (!) 108  Resp: 20  Temp: 98.5 F (36.9 C)  SpO2: 95%

## 2020-05-24 NOTE — Progress Notes (Signed)
  Radiation Oncology         [phone] ________________________________  Name: Tara Aguilar MRN: [id number]  Date of Service: 05/24/2020  DOB: Dec 01, 1980  Steroid Taper Instructions   You currently have a prescription for Dexamethasone 4 mg Tablets.   Beginning 05/25/20:  Take a 4 mg tablet twice a day  Beginning 06/01/20: Take 1/2 of a tablet (which is 2 mg) twice a day  Beginning 06/08/20: Take 1/2 of a tablet (which is 2 mg) once a day  Beginning 06/15/20: Take 1/2 of a tablet (which is 2 mg) every other day and stop on 06/26/20.   Please call our office if you have any headaches, visual changes, uncontrolled movements, nausea or vomiting.

## 2020-05-28 ENCOUNTER — Other Ambulatory Visit (HOSPITAL_COMMUNITY): Payer: Self-pay | Admitting: *Deleted

## 2020-05-28 DIAGNOSIS — C3490 Malignant neoplasm of unspecified part of unspecified bronchus or lung: Secondary | ICD-10-CM

## 2020-05-29 ENCOUNTER — Other Ambulatory Visit: Payer: Self-pay

## 2020-05-29 ENCOUNTER — Ambulatory Visit (HOSPITAL_COMMUNITY)
Admission: RE | Admit: 2020-05-29 | Discharge: 2020-05-29 | Disposition: A | Discharge status: Home / Self Care | Payer: BC Managed Care – PPO | Source: Ambulatory Visit | Attending: Hematology | Admitting: Hematology

## 2020-05-29 ENCOUNTER — Inpatient Hospital Stay (HOSPITAL_COMMUNITY): Payer: BC Managed Care – PPO | Attending: Hematology

## 2020-05-29 DIAGNOSIS — Z806 Family history of leukemia: Secondary | ICD-10-CM | POA: Diagnosis not present

## 2020-05-29 DIAGNOSIS — Z7952 Long term (current) use of systemic steroids: Secondary | ICD-10-CM | POA: Diagnosis not present

## 2020-05-29 DIAGNOSIS — Z7901 Long term (current) use of anticoagulants: Secondary | ICD-10-CM | POA: Diagnosis not present

## 2020-05-29 DIAGNOSIS — I82612 Acute embolism and thrombosis of superficial veins of left upper extremity: Secondary | ICD-10-CM | POA: Insufficient documentation

## 2020-05-29 DIAGNOSIS — C3490 Malignant neoplasm of unspecified part of unspecified bronchus or lung: Secondary | ICD-10-CM | POA: Diagnosis present

## 2020-05-29 DIAGNOSIS — R05 Cough: Secondary | ICD-10-CM | POA: Diagnosis not present

## 2020-05-29 DIAGNOSIS — Z79899 Other long term (current) drug therapy: Secondary | ICD-10-CM | POA: Diagnosis not present

## 2020-05-29 DIAGNOSIS — Z801 Family history of malignant neoplasm of trachea, bronchus and lung: Secondary | ICD-10-CM | POA: Diagnosis not present

## 2020-05-29 DIAGNOSIS — C7931 Secondary malignant neoplasm of brain: Secondary | ICD-10-CM | POA: Insufficient documentation

## 2020-05-29 DIAGNOSIS — C7970 Secondary malignant neoplasm of unspecified adrenal gland: Secondary | ICD-10-CM | POA: Diagnosis present

## 2020-05-29 DIAGNOSIS — Z923 Personal history of irradiation: Secondary | ICD-10-CM | POA: Diagnosis not present

## 2020-05-29 DIAGNOSIS — R590 Localized enlarged lymph nodes: Secondary | ICD-10-CM | POA: Insufficient documentation

## 2020-05-29 DIAGNOSIS — F17210 Nicotine dependence, cigarettes, uncomplicated: Secondary | ICD-10-CM | POA: Insufficient documentation

## 2020-05-29 DIAGNOSIS — G629 Polyneuropathy, unspecified: Secondary | ICD-10-CM | POA: Diagnosis not present

## 2020-05-29 LAB — COMPREHENSIVE METABOLIC PANEL
ALT: 571 U/L — ABNORMAL HIGH (ref 0–44)
AST: 92 U/L — ABNORMAL HIGH (ref 15–41)
Albumin: 3.8 g/dL (ref 3.5–5.0)
Alkaline Phosphatase: 103 U/L (ref 38–126)
Anion gap: 10 (ref 5–15)
BUN: 38 mg/dL — ABNORMAL HIGH (ref 6–20)
CO2: 31 mmol/L (ref 22–32)
Calcium: 9.7 mg/dL (ref 8.9–10.3)
Chloride: 98 mmol/L (ref 98–111)
Creatinine, Ser: 0.94 mg/dL (ref 0.44–1.00)
GFR calc Af Amer: >60 mL/min (ref >60)
GFR calc non Af Amer: >60 mL/min (ref >60)
Glucose, Bld: 293 mg/dL — ABNORMAL HIGH (ref 70–99)
Potassium: 5.3 mmol/L — ABNORMAL HIGH (ref 3.5–5.1)
Sodium: 139 mmol/L (ref 135–145)
Total Bilirubin: 1.1 mg/dL (ref 0.3–1.2)
Total Protein: 7 g/dL (ref 6.5–8.1)

## 2020-05-29 LAB — CBC WITH DIFFERENTIAL/PLATELET
Abs Immature Granulocytes: 0.22 10*3/uL — ABNORMAL HIGH (ref 0.00–0.07)
Basophils Absolute: 0.1 10*3/uL (ref 0.0–0.1)
Basophils Relative: 1 %
Eosinophils Absolute: 0 10*3/uL (ref 0.0–0.5)
Eosinophils Relative: 0 %
HCT: 50.4 % — ABNORMAL HIGH (ref 36.0–46.0)
Hemoglobin: 15.8 g/dL — ABNORMAL HIGH (ref 12.0–15.0)
Immature Granulocytes: 2 %
Lymphocytes Relative: 6 %
Lymphs Abs: 0.7 10*3/uL (ref 0.7–4.0)
MCH: 30.5 pg (ref 26.0–34.0)
MCHC: 31.3 g/dL (ref 30.0–36.0)
MCV: 97.3 fL (ref 80.0–100.0)
Monocytes Absolute: 0.5 10*3/uL (ref 0.1–1.0)
Monocytes Relative: 4 %
Neutro Abs: 9.4 10*3/uL — ABNORMAL HIGH (ref 1.7–7.7)
Neutrophils Relative %: 87 %
Platelets: 145 10*3/uL — ABNORMAL LOW (ref 150–400)
RBC: 5.18 MIL/uL — ABNORMAL HIGH (ref 3.87–5.11)
RDW: 16.6 % — ABNORMAL HIGH (ref 11.5–15.5)
WBC: 10.8 10*3/uL — ABNORMAL HIGH (ref 4.0–10.5)
nRBC: 0 % (ref 0.0–0.2)

## 2020-05-29 MED ORDER — IOHEXOL 300 MG/ML  SOLN
100.0000 mL | Freq: Once | INTRAMUSCULAR | Status: AC | PRN
  Administered 2020-05-29: 100 mL via INTRAVENOUS

## 2020-05-30 IMAGING — CT CT CHEST W/ CM
2 of 5 series · 9 of 36 positions shown, 11 images · IV contrast (omnipaque)
Comparison: Multiple prior examinations, most recent 06/06/2019.
COMPARISON: Multiple prior examinations, most recent 06/06/2019.

Addendum:
CLINICAL DATA: Metastatic squamous cell, history of head neck
cancer with metastatic disease to the adrenal glands. Recent PET
exam with nodal disease in the chest. Three cycles of systemic
therapy completed on 05/23/2019 and cycle 4 on 06/13/2019. Most
recent CT showed improvement with respect to nodal disease.

EXAM:
CT CHEST, ABDOMEN, AND PELVIS WITH CONTRAST
TECHNIQUE: Multidetector CT imaging of the chest, abdomen and pelvis was
performed following the standard protocol during bolus
administration of intravenous contrast.
CONTRAST:  100mL OMNIPAQUE IOHEXOL 300 MG/ML  SOLN

[Series 2: cap with · axial · 0.88mm/px · z∈[+893,+1428]mm · 6 of 135 slices shown, 8 images]
[im 14/135  mediastinal]
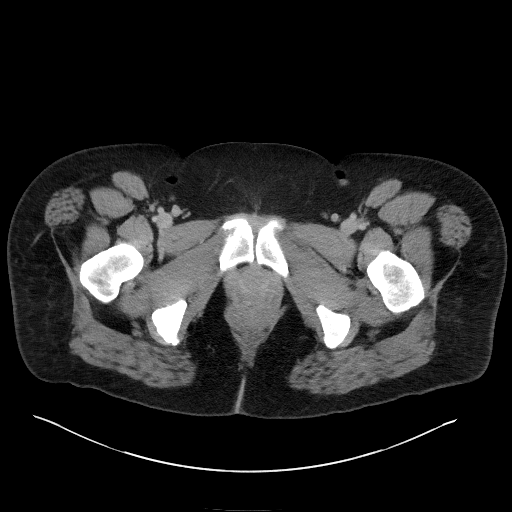
[im 14/135  lung]
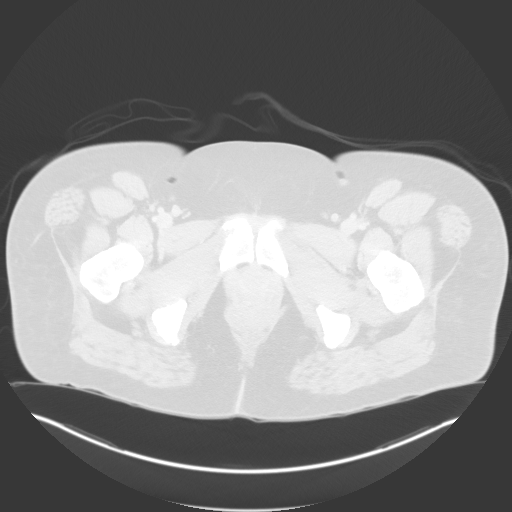
[im 41/135  lung]
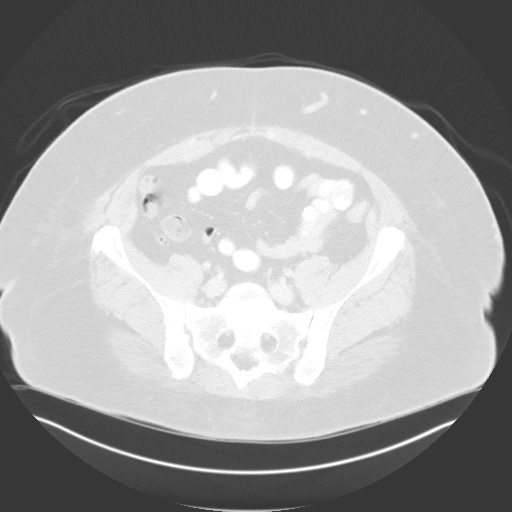
[im 54/135  lung]
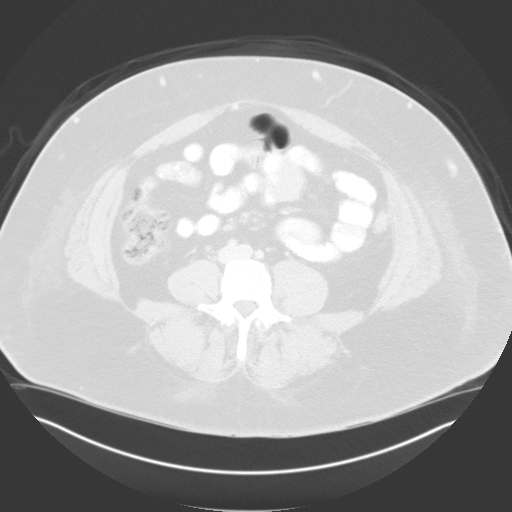
[im 81/135  lung]
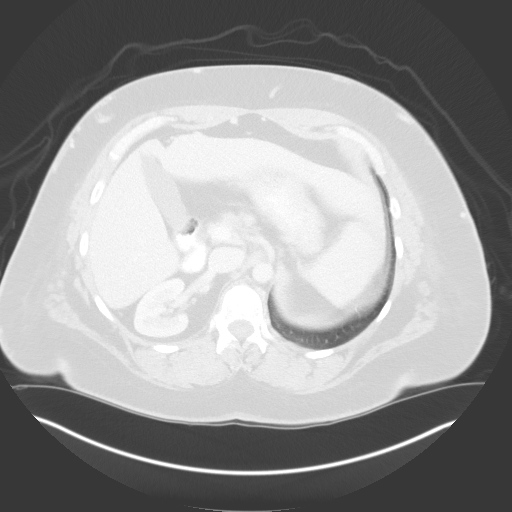
[im 94/135  mediastinal]
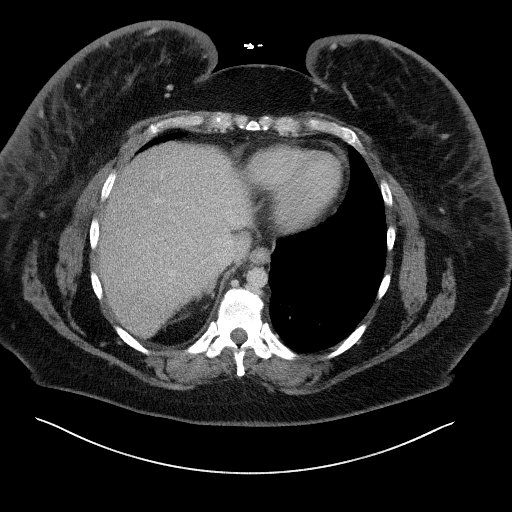
[im 94/135  lung]
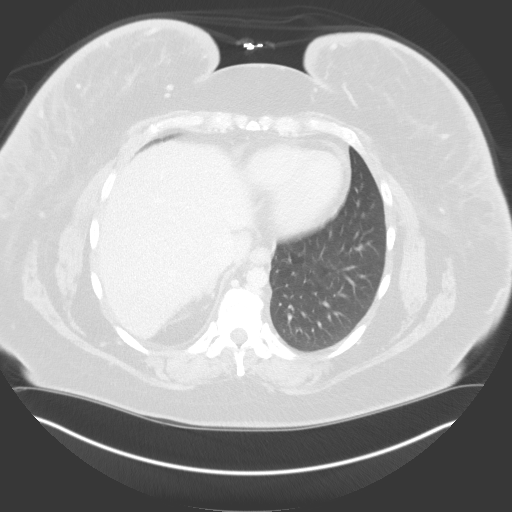
[im 121/135  lung]
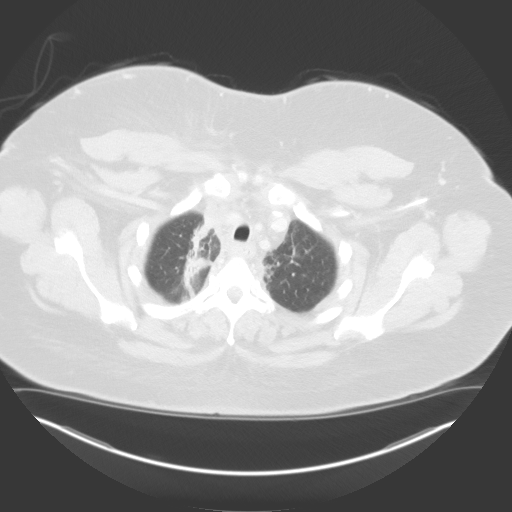

[Series 5: coronals · coronal · 0.82mm/px · 3 of 185 slices shown]
[im 37/185  lung]
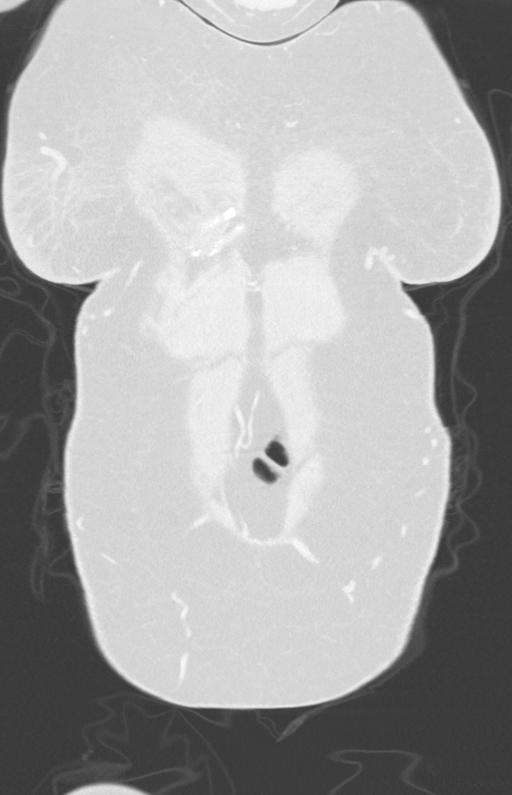
[im 74/185  lung]
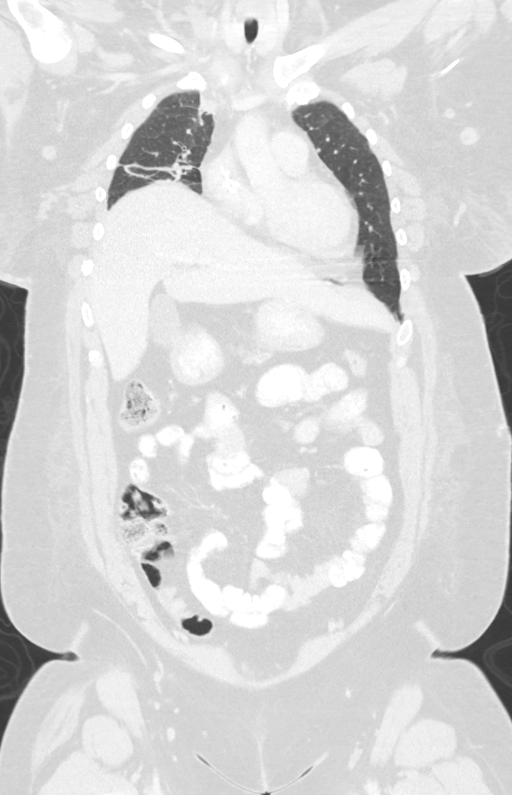
[im 111/185  lung]
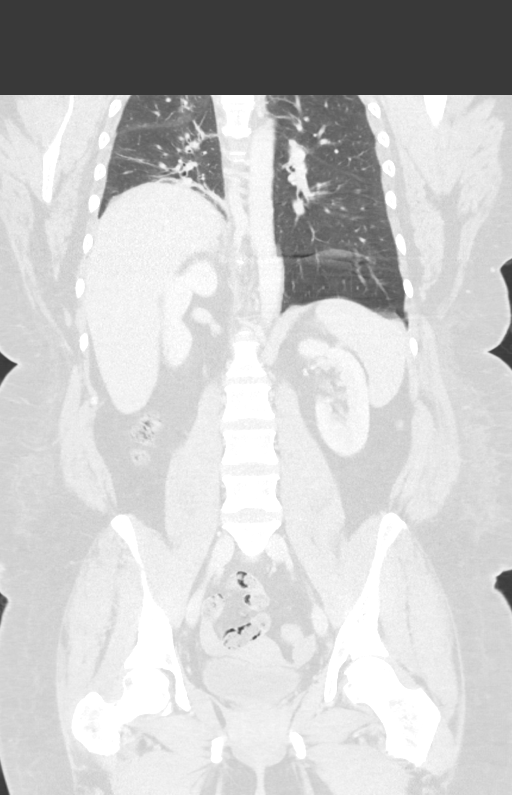

[9 of 36 positions shown; findings below may reference images not displayed]

FINDINGS: CT CHEST FINDINGS

Cardiovascular: Soft tissue about the superior mediastinum vascular
structures is redemonstrated, see below for specific measurements.
Left-sided central venous access device, PICC line terminating at
the caval to atrial junction. Aortic caliber is normal. Heart size
is stable with small pericardial effusion. Central pulmonary
vasculature is unremarkable.

Mediastinum/Nodes: Right supraclavicular soft tissue is not as well
defined as on previous exam is. This measures approximately 3.1 x
1.8 cm (image 7, series 2) previously measuring approximately 3.4 by
2.7 cm.

Soft tissue tracks into the superior mediastinum and along the right
paratracheal chain. This interdigitates with the fat planes about
the vessels in this area. Soft tissue measured at the level of the
mid right paratracheal chain (image 20, series [DATE] x 2.0 cm,
ill-defined nature makes it difficult to evaluate but appearing
similar to previous exam. With

Enlargement of left axillary lymph node not well assessed on the
04/01/2019 exam and stable compared to the most recent comparison
measuring approximately 2.1 cm in short axis. Other prominent nodes
in the bilateral axilla are similar as well.

Subcarinal nodal tissue may be smaller than on the previous exam now
measuring approximately 7 mm in short axis (image 27, series 2

Lungs/Pleura: Marked improved aeration of the right lower lobe as
compared to the prior study. Paramediastinal bandlike opacities in
the right chest tract towards the right hilum. Platelike

Ground-glass opacities in the left upper lobe are shifting slightly
though no areas of dense consolidation. Right hemidiaphragm remains
elevated. In atelectatic changes are noted in the right middle lobe
and lower lobe.

Musculoskeletal: Soft tissue at the right thoracic inlet as
discussed. No new signs of new chest wall mass.

Soft tissue encasement of venous structures leads to collateral
vessels about the chest. Right brachycephalic occlusion is similar.

CT ABDOMEN PELVIS FINDINGS

Hepatobiliary: No signs of focal hepatic lesion. No signs of biliary
ductal dilation. Gallbladder is normal.

Pancreas: Unremarkable. No pancreatic ductal dilatation or
surrounding inflammatory changes.

Spleen: Normal in size without focal abnormality.

Adrenals/Urinary Tract: Adrenal glands are unremarkable. Kidneys
enhance symmetrically without signs of hydro nephrosis. There is a
nonobstructing calculus in the distal right ureter 1-2 cm proximal
to the right UVJ. This calculus measures approximately 3-4 mm.

No signs of suspicious renal lesion.

Stomach/Bowel: No acute gastrointestinal process. Appendix is
normal.

Vascular/Lymphatic: Abdominal vasculature is patent. No signs of
adenopathy in the retroperitoneum or upper abdomen. Patent splenic
portal venous system.

No signs of pelvic vascular abnormality. No sign of pelvic
adenopathy.

Reproductive: Uterus remains in situ.  No signs of pelvic mass.

Other: Urinary bladder unremarkable aside from distal right ureteral
calculus with mild periureteral stranding. No sign of free fluid in
the pelvis. No free air in the abdomen.

Musculoskeletal: No sign of acute bone finding or evidence of
destructive bone process. Spinal degenerative changes.
IMPRESSION: Continued improvement with respect to right supraclavicular mass
with grossly stable appearance of mediastinal soft tissue.

Redemonstration of central venous occlusion in the chest. SVC
remains patent via left brachiocephalic vein.

Improved aeration in the right chest with areas of segmental
atelectasis

Paramediastinal post radiation changes.

Currently the adrenal glands have a normal radiographic appearance
not changed from recent comparison evaluation. Continued attention
on follow-up is suggested given history.

ADDENDUM:
In the body of the report but not in the conclusion there was
mention of a distal right ureteral calculus, 4 mm, just proximal to
the right UVJ. This does not cause obstruction or alteration of
renal enhancement right versus left.

These results will be called to the ordering clinician or
representative by the Radiologist Assistant, and communication
documented in the PACS or zVision Dashboard.

*** End of Addendum ***
FINDINGS: CT CHEST FINDINGS

Cardiovascular: Soft tissue about the superior mediastinum vascular
structures is redemonstrated, see below for specific measurements.
Left-sided central venous access device, PICC line terminating at
the caval to atrial junction. Aortic caliber is normal. Heart size
is stable with small pericardial effusion. Central pulmonary
vasculature is unremarkable.

Mediastinum/Nodes: Right supraclavicular soft tissue is not as well
defined as on previous exam is. This measures approximately 3.1 x
1.8 cm (image 7, series 2) previously measuring approximately 3.4 by
2.7 cm.

Soft tissue tracks into the superior mediastinum and along the right
paratracheal chain. This interdigitates with the fat planes about
the vessels in this area. Soft tissue measured at the level of the
mid right paratracheal chain (image 20, series [DATE] x 2.0 cm,
ill-defined nature makes it difficult to evaluate but appearing
similar to previous exam. With

Enlargement of left axillary lymph node not well assessed on the
04/01/2019 exam and stable compared to the most recent comparison
measuring approximately 2.1 cm in short axis. Other prominent nodes
in the bilateral axilla are similar as well.

Subcarinal nodal tissue may be smaller than on the previous exam now
measuring approximately 7 mm in short axis (image 27, series 2

Lungs/Pleura: Marked improved aeration of the right lower lobe as
compared to the prior study. Paramediastinal bandlike opacities in
the right chest tract towards the right hilum. Platelike

Ground-glass opacities in the left upper lobe are shifting slightly
though no areas of dense consolidation. Right hemidiaphragm remains
elevated. In atelectatic changes are noted in the right middle lobe
and lower lobe.

Musculoskeletal: Soft tissue at the right thoracic inlet as
discussed. No new signs of new chest wall mass.

Soft tissue encasement of venous structures leads to collateral
vessels about the chest. Right brachycephalic occlusion is similar.

CT ABDOMEN PELVIS FINDINGS

Hepatobiliary: No signs of focal hepatic lesion. No signs of biliary
ductal dilation. Gallbladder is normal.

Pancreas: Unremarkable. No pancreatic ductal dilatation or
surrounding inflammatory changes.

Spleen: Normal in size without focal abnormality.

Adrenals/Urinary Tract: Adrenal glands are unremarkable. Kidneys
enhance symmetrically without signs of hydro nephrosis. There is a
nonobstructing calculus in the distal right ureter 1-2 cm proximal
to the right UVJ. This calculus measures approximately 3-4 mm.

No signs of suspicious renal lesion.

Stomach/Bowel: No acute gastrointestinal process. Appendix is
normal.

Vascular/Lymphatic: Abdominal vasculature is patent. No signs of
adenopathy in the retroperitoneum or upper abdomen. Patent splenic
portal venous system.

No signs of pelvic vascular abnormality. No sign of pelvic
adenopathy.

Reproductive: Uterus remains in situ.  No signs of pelvic mass.

Other: Urinary bladder unremarkable aside from distal right ureteral
calculus with mild periureteral stranding. No sign of free fluid in
the pelvis. No free air in the abdomen.

Musculoskeletal: No sign of acute bone finding or evidence of
destructive bone process. Spinal degenerative changes.
IMPRESSION: Continued improvement with respect to right supraclavicular mass
with grossly stable appearance of mediastinal soft tissue.

Redemonstration of central venous occlusion in the chest. SVC
remains patent via left brachiocephalic vein.

Improved aeration in the right chest with areas of segmental
atelectasis

Paramediastinal post radiation changes.

Currently the adrenal glands have a normal radiographic appearance
not changed from recent comparison evaluation. Continued attention
on follow-up is suggested given history.

## 2020-05-31 ENCOUNTER — Ambulatory Visit (HOSPITAL_COMMUNITY): Payer: BC Managed Care – PPO | Admitting: Hematology

## 2020-05-31 ENCOUNTER — Other Ambulatory Visit: Payer: Self-pay

## 2020-05-31 ENCOUNTER — Inpatient Hospital Stay (HOSPITAL_COMMUNITY): Payer: BC Managed Care – PPO | Admitting: Hematology

## 2020-05-31 VITALS — BP 136/96 | HR 118 | Temp 98.1°F | Resp 18 | Wt 292.5 lb

## 2020-05-31 DIAGNOSIS — C3490 Malignant neoplasm of unspecified part of unspecified bronchus or lung: Secondary | ICD-10-CM

## 2020-05-31 NOTE — Patient Instructions (Signed)
[place] at [facility] Discharge Instructions  You were seen today by Dr. Delton Coombes. He went over your recent results. You will be scheduled for a CT scan of your chest. Continue being more physically active. Dr. Delton Coombes will see you back in 2 weeks for labs and follow up.   Thank you for choosing [place] at [facility] to provide your oncology and hematology care.  To afford each patient quality time with our provider, please arrive at least 15 minutes before your scheduled appointment time.   If you have a lab appointment with the [facility] please come in thru the Main Entrance and check in at the main information desk  You need to re-schedule your appointment should you arrive 10 or more minutes late.  We strive to give you quality time with our providers, and arriving late affects you and other patients whose appointments are after yours.  Also, if you no show three or more times for appointments you may be dismissed from the clinic at the providers discretion.     Again, thank you for choosing [facility].  Our hope is that these requests will decrease the amount of time that you wait before being seen by our physicians.       _____________________________________________________________  Should you have questions after your visit to [facility], please contact our office at [phone] between the hours of 8:00 a.m. and 4:30 p.m.  Voicemails left after 4:00 p.m. will not be returned until the following business day.  For prescription refill requests, have your pharmacy contact our office and allow 72 hours.    Cancer Center Support Programs:   > Cancer Support Group  2nd Tuesday of the month 1pm-2pm, Journey Room

## 2020-05-31 NOTE — Progress Notes (Signed)
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  PRIOR THERAPY: Carboplatin, paclitaxel and Keytruda x 7 cycles from 04/08/2019 to 08/16/2019  NGS Results: Not done  CURRENT THERAPY: Keytruda every 3 weeks  BRIEF ONCOLOGIC HISTORY:  Oncology History  Squamous cell lung cancer, unspecified laterality ([place])  04/05/2019 Initial Diagnosis   Squamous cell lung cancer, unspecified laterality ([place])   04/08/2019 -  Chemotherapy   The patient had palonosetron (ALOXI) injection 0.25 mg, 0.25 mg, Intravenous,  Once, 7 of 7 cycles Administration: 0.25 mg (04/08/2019), 0.25 mg (05/02/2019), 0.25 mg (05/23/2019), 0.25 mg (06/13/2019), 0.25 mg (07/05/2019), 0.25 mg (07/26/2019), 0.25 mg (08/16/2019) pegfilgrastim (NEULASTA) injection 6 mg, 6 mg, Subcutaneous, Once, 2 of 2 cycles Administration: 6 mg (04/11/2019), 6 mg (05/04/2019) pegfilgrastim (NEULASTA ONPRO KIT) injection 6 mg, 6 mg, Subcutaneous, Once, 3 of 3 cycles pegfilgrastim-cbqv (UDENYCA) injection 6 mg, 6 mg, Subcutaneous, Once, 5 of 5 cycles Administration: 6 mg (05/25/2019), 6 mg (06/15/2019), 6 mg (07/07/2019), 6 mg (07/28/2019), 6 mg (08/18/2019) CARBOplatin (PARAPLATIN) 900 mg in sodium chloride 0.9 % 500 mL chemo infusion, 900 mg (100 % of original dose 900 mg), Intravenous,  Once, 7 of 7 cycles Dose modification:   (original dose 900 mg, Cycle 1),   (original dose 900 mg, Cycle 2),   (original dose 900 mg, Cycle 3),   (original dose 900 mg, Cycle 4) Administration: 900 mg (04/08/2019), 900 mg (05/02/2019), 900 mg (05/23/2019), 900 mg (06/13/2019), 900 mg (07/05/2019), 900 mg (07/26/2019), 900 mg (08/16/2019) PACLitaxel (TAXOL) 492 mg in sodium chloride 0.9 % 500 mL chemo infusion (> 62m/m2), 200 mg/m2 = 492 mg, Intravenous,  Once, 7 of 7  cycles Dose modification: 175 mg/m2 (original dose 200 mg/m2, Cycle 6, Reason: Other (see comments), Comment: neuropathy) Administration: 492 mg (04/08/2019), 492 mg (05/02/2019), 492 mg (05/23/2019), 492 mg (06/13/2019), 492 mg (07/05/2019), 432 mg (07/26/2019), 432 mg (08/16/2019) pembrolizumab (KEYTRUDA) 200 mg in sodium chloride 0.9 % 50 mL chemo infusion, 200 mg, Intravenous, Once, 18 of 19 cycles Administration: 200 mg (04/08/2019), 200 mg (07/05/2019), 200 mg (05/02/2019), 200 mg (05/23/2019), 200 mg (06/13/2019), 200 mg (07/26/2019), 200 mg (08/16/2019), 200 mg (09/06/2019), 200 mg (09/27/2019), 200 mg (10/19/2019), 200 mg (11/09/2019), 200 mg (11/30/2019), 200 mg (12/21/2019), 200 mg (01/12/2020), 200 mg (02/07/2020), 200 mg (02/28/2020), 200 mg (03/21/2020), 200 mg (04/11/2020) fosaprepitant (EMEND) 150 mg, dexamethasone (DECADRON) 12 mg in sodium chloride 0.9 % 145 mL IVPB, , Intravenous,  Once, 7 of 7 cycles Administration:  (04/08/2019),  (05/02/2019),  (05/23/2019),  (06/13/2019),  (07/05/2019),  (07/26/2019),  (08/16/2019)  for chemotherapy treatment.      CANCER STAGING: Cancer Staging No matching staging information was found for the patient.  INTERVAL HISTORY:  Ms. TSky Primo a 39y.o. 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 T[place]as last seen on 05/16/2020. Her last KBeryle Flockwas on 04/11/2020.  Today she reports that her radiation therapy finished on Thursday 7/29. Her numbness in her hands and feet is stable, but she denies dropping things and she takes gabapentin daily. She is used nebulizer for her productive cough, mainly white sputum. She denies headaches or vision changes or leg swelling. Starting Friday, 8/6, she will take dexamethasone 2  mg BID. She tries to be active and to eat cleanly.   REVIEW OF SYSTEMS:  Review of Systems  Constitutional: Positive for fatigue (mild). Negative for appetite change.  Eyes: Negative for eye problems.    Respiratory: Positive for cough (productive w/ white sputum).   Cardiovascular: Negative for leg swelling.  Neurological: Positive for numbness (hands & feet). Negative for headaches.  All other systems reviewed and are negative.   PAST MEDICAL/SURGICAL HISTORY:  Past Medical History:  Diagnosis Date  . Cancer ([facility])   . Depression   . Glucose intolerance 03/11/2019  . Obesity, Class III, BMI 40-49.9 (morbid obesity) ([facility]) 03/11/2019   Past Surgical History:  Procedure Laterality Date  . CESAREAN SECTION    . IRRIGATION AND DEBRIDEMENT ABSCESS  03/15/2019   RETROPHARYNGEAL   . TONSILLECTOMY AND ADENOIDECTOMY N/A 03/15/2019   Procedure: IRRIGATION AND DEBRIDEMENT OF RETROPHARYNGEAL ABSCESS;  Surgeon: Leta Baptist, MD;  Location: [facility];  Service: ENT;  Laterality: N/A;    SOCIAL HISTORY:  Social History   Socioeconomic History  . Marital status: Widowed    Spouse name: Not on file  . Number of children: 1  . Years of education: Not on file  . Highest education level: Not on file  Occupational History  . Not on file  Tobacco Use  . Smoking status: Current Some Day Smoker    Packs/day: 1.00    Years: 25.00    Pack years: 25.00    Types: Cigarettes  . Smokeless tobacco: Never Used  Vaping Use  . Vaping Use: Never used  Substance and Sexual Activity  . Alcohol use: Not Currently  . Drug use: Not Currently  . Sexual activity: Not on file  Other Topics Concern  . Not on file  Social History Narrative   Mar 16, 2019   Husband passed away four months ago from Cancer of the [place].   Pt. Has 72 yo son at home.   Social Determinants of Health   Financial Resource Strain:   . Difficulty of Paying Living Expenses:   Food Insecurity:   . Worried About Charity fundraiser in the Last Year:   . Arboriculturist in the Last Year:   Transportation Needs:   . Film/video editor (Medical):   [name] Lack of Transportation (Non-Medical):   Physical Activity:   . Days of Exercise per  Week:   . Minutes of Exercise per Session:   Stress:   . Feeling of Stress :   Social Connections:   . Frequency of Communication with Friends and Family:   . Frequency of Social Gatherings with Friends and Family:   . Attends Religious Services:   . Active Member of Clubs or Organizations:   . Attends Archivist Meetings:   [name] Marital Status:   Intimate Partner Violence:   . Fear of Current or Ex-Partner:   . Emotionally Abused:   [name] Physically Abused:   . Sexually Abused:     FAMILY HISTORY:  Family History  Problem Relation Age of Onset  . Lung cancer Mother   . Hypertension Mother   . Leukemia Father   . Myelodysplastic syndrome Father     CURRENT MEDICATIONS:  Current Outpatient Medications  Medication Sig Dispense Refill  . apixaban (ELIQUIS) 5 MG TABS tablet Take 1 tablet (5 mg total) by mouth 2 (two) times daily. 60 tablet 5  . dexamethasone (DECADRON) 4 MG tablet Take 1 tablet (4 mg total) by mouth 3 (three)  times daily. 90 tablet 1  . Nebulizers (COMPRESSOR/NEBULIZER) MISC 1 Units by Does not apply route as needed. Nebulizer treatments as needed for shortness of breath cough and wheezing 1 each 0  . albuterol (PROVENTIL) (2.5 MG/3ML) 0.083% nebulizer solution Take 3 mLs (2.5 mg total) by nebulization every 2 (two) hours as needed for wheezing. (Patient not taking: Reported on 05/31/2020) 75 mL 12  . albuterol (VENTOLIN HFA) 108 (90 Base) MCG/ACT inhaler Inhale 2 puffs into the lungs every 6 (six) hours as needed for wheezing or shortness of breath. (Patient not taking: Reported on 05/31/2020) 18 g 3  . gabapentin (NEURONTIN) 300 MG capsule Take 1 capsule (300 mg total) by mouth 3 (three) times daily. Take 300 mg in the morning, 300 mg at noon and 600 mg at bedtime. (Patient not taking: Reported on 05/31/2020) 120 capsule 2  . methocarbamol (ROBAXIN) 750 MG tablet Take 1 tablet (750 mg total) by mouth 3 (three) times daily. 90 tablet 1  . Multiple Vitamin (MULTIVITAMIN  WITH MINERALS) TABS tablet Take 1 tablet by mouth daily. 120 tablet 2  . oxyCODONE (OXY IR/ROXICODONE) 5 MG immediate release tablet Take 1 tablet (5 mg total) by mouth every 4 (four) hours as needed for severe pain. (Patient not taking: Reported on 05/31/2020) 90 tablet 0  . prochlorperazine (COMPAZINE) 10 MG tablet Take 1 tablet (10 mg total) by mouth every 6 (six) hours as needed (Nausea or vomiting). (Patient not taking: Reported on 05/31/2020) 30 tablet 1  . tiotropium (SPIRIVA HANDIHALER) 18 MCG inhalation capsule Place 1 capsule (18 mcg total) into inhaler and inhale daily. 30 capsule 2   No current facility-administered medications for this visit.    ALLERGIES:  No Known Allergies  PHYSICAL EXAM:  Performance status (ECOG): 1 - Symptomatic but completely ambulatory  Vitals:   05/31/20 1130 05/31/20 1132  BP: (!) 140/91 (!) 136/96  Pulse: (!) 118   Resp: 18   Temp: 98.1 F (36.7 C)   SpO2: 97%    Wt Readings from Last 3 Encounters:  05/31/20 292 lb 8 oz (132.7 kg)  05/16/20 295 lb 3.2 oz (133.9 kg)  05/02/20 299 lb 14.4 oz (136 kg)   Physical Exam Vitals reviewed.  Constitutional:      Appearance: Normal appearance. She is obese.  Neurological:     General: No focal deficit present.     Mental Status: She is alert and oriented to person, place, and time.  Psychiatric:        Mood and Affect: Mood normal.        Behavior: Behavior normal.      LABORATORY DATA:  I have reviewed the labs as listed.  CBC Latest Ref Rng & Units 05/29/2020 05/16/2020 05/02/2020  WBC 4.0 - 10.5 K/uL 10.8(H) 21.0(H) 7.7  Hemoglobin 12.0 - 15.0 g/dL 15.8(H) 16.5(H) 15.2(H)  Hematocrit 36 - 46 % 50.4(H) 51.3(H) 47.8(H)  Platelets 150 - 400 K/uL 145(L) 207 246   CMP Latest Ref Rng & Units 05/29/2020 05/16/2020 05/02/2020  Glucose 70 - 99 mg/dL 293(H) 223(H) 119(H)  BUN 6 - 20 mg/dL 38(H) 29(H) 16  Creatinine 0.44 - 1.00 mg/dL 0.94 1.22(H) 0.90  Sodium 135 - 145 mmol/L 139 137 138  Potassium 3.5 -  5.1 mmol/L 5.3(H) 4.5 4.2  Chloride 98 - 111 mmol/L 98 98 102  CO2 22 - 32 mmol/L _0 Calcium 8.9 - 10.3 mg/dL 9.7 8.6(L) 9.1  Total Protein 6.5 - 8.1 g/dL 7.0 6.6  7.5  Total Bilirubin 0.3 - 1.2 mg/dL 1.1 1.2 0.4  Alkaline Phos 38 - 126 U/L 103 88 69  AST 15 - 41 U/L 92(H) 117(H) 14(L)  ALT 0 - 44 U/L 571(H) 632(H) 30    DIAGNOSTIC IMAGING:  I have independently reviewed the scans and discussed with the patient. MR Brain W Wo Contrast  Addendum Date: 05/11/2020   ADDENDUM REPORT: 05/11/2020 14:46 ADDENDUM: Repeat postcontrast imaging was performed. Left frontal lesion on series 4, image 118 measures 1 x 0.9 cm (previously 1.2 x 0.9 cm) Left parietal lesion on series 4, image 106 measures 2 x 2 cm (previously 2 x 2 cm) Left superior temporal lesion on series 4, image 87 measures 1.7 x 1.7 cm (previously 1.7 x 1.5 cm) No new mass or abnormal enhancement. Electronically Signed   By: Macy Mis M.D.   On: 05/11/2020 14:46   Result Date: 05/11/2020 CLINICAL DATA:  Lung cancer, follow-up EXAM: MRI HEAD WITHOUT AND WITH CONTRAST TECHNIQUE: Multiplanar, multiecho pulse sequences of the brain and surrounding structures were obtained without and with intravenous contrast. CONTRAST:  81m MULTIHANCE GADOBENATE DIMEGLUMINE 529 MG/ML IV SOLN COMPARISON:  04/26/2020 FINDINGS: Significant motion artifact. Brain: Comparison is limited. Previously identified 3 left cerebral lesions are similar in size. Associated left cerebral edema is mildly increased. No definite new lesion. There is no acute infarction. Similar partial effacement of the left lateral ventricle. No midline shift. No hydrocephalus or extra-axial fluid collection. Vascular: Major vessel flow voids at the skull base are preserved. Skull and upper cervical spine: Normal marrow signal is preserved. Sinuses/Orbits: Minor mucosal thickening.  Orbits are unremarkable. Other: Sella is unremarkable.  Mastoid air cells are clear. IMPRESSION:  Significantly motion degraded study with limited measurement comparison. The three previously identified left cerebral lesions appear similar in size. Associated left cerebral edema is mildly increased without significant change in mass effect. Electronically Signed: By: PMacy MisM.D. On: 05/09/2020 16:40   CT Abdomen Pelvis W Contrast  Result Date: 05/30/2020 CLINICAL DATA:  Non-small cell lung cancer. History of head neck cancer. Brain metastasis demonstrated on recent MRI EXAM: CT ABDOMEN AND PELVIS WITH CONTRAST TECHNIQUE: Multidetector CT imaging of the abdomen and pelvis was performed using the standard protocol following bolus administration of intravenous contrast. CONTRAST:  1082mOMNIPAQUE IOHEXOL 300 MG/ML  SOLN COMPARISON:  CT abdomen 02/03/2020 FINDINGS: Lower chest: Atelectasis in the RIGHT middle lobe. No discrete nodularity at the lung bases. Some motion degradation. Hepatobiliary: Segmental vascular enhancement in the central LEFT hepatic lobe is a benign vascular phenomena related SVC obstruction. No enhancing hepatic lesion. Gallbladder normal. Pancreas: Pancreas is normal. No ductal dilatation. No pancreatic inflammation. Spleen: Normal spleen Adrenals/urinary tract: Adrenal glands and kidneys are normal. The ureters and bladder normal. Stomach/Bowel: Stomach, small bowel, appendix, and cecum are normal. The colon and rectosigmoid colon are normal. Vascular/Lymphatic: Abdominal aorta is normal caliber. No periportal or retroperitoneal adenopathy. No pelvic adenopathy. Venous collaterals in the anterior chest abdominal related to SVC obstruction. Reproductive: Uterus and ovaries normal. Other: No free fluid. Musculoskeletal: No aggressive osseous lesion. IMPRESSION: 1. No evidence of metastatic disease in the abdomen or pelvis. 2. No discrete nodularity demonstrate at the lung bases. Some motion degradation. Electronically Signed   By: StSuzy Bouchard.D.   On: 05/30/2020 08:32      ASSESSMENT:  1. 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 -Maintenance pembrolizumab started  on 09/06/2019. -MRI brain from 02/03/2020 showed 4 mm left parietal nodular enhancement. Differential includes metastatic versus subacute infarction. -CT CAP on 02/03/2020 showed new nodularity in the bilateral upper lungs measuring 6 mm. Ill-defined soft tissue in the medial right supraclavicular region and right paratracheal region stable versus mildly improved. Stable mild axillary lymphadenopathy, right greater than left. No suspicious findings in the abdomen or pelvis. -CT chest with contrast on 03/28/2020 showed subpleural groundglass opacity in the left lower lobe consistent with atelectasis/infection. Bilateral micronodularity, mostly in the upper part of the lungs progressive from 02/03/2020. -CT AP on 05/29/2020 did not show any evidence of metastatic disease in the abdomen or pelvis.  2. Left basilic vein thrombus: -Ultrasound on 05/03/2019 showed nonocclusive thrombus around the PICC line in the left basilic vein considered superficial thrombus with no evidence of DVT. -Because of her metastatic cancer, she was told to continue Eliquis.  3. Brain metastasis: -MRI of the brain on 04/26/2020 showed left parietal lesion measuring 2.0 x 1.6 cm. Left frontal enhancing lesion measures 1.2 x 0.9 cm. 1.7 x 1 point centimeters enhancing dorsal left frontal/superior temporal lesion present. -SRS to the brain lesions completed on 05/24/2020.   PLAN:  1. Metastatic squamous cell carcinoma of the lung: -She has completed SRS for brain lesions.  CT of the abdomen and pelvis did not show any evidence of metastatic disease. -She is currently on dexamethasone 4 mg twice daily which will be changed to 2 mg twice daily tomorrow for 1 week. -Because of the steroids, I would hold off on pembrolizumab  at this time. -I plan to repeat a CT of the chest in 2 weeks.  If there is any further progress, will consider changing therapy.  Otherwise we will continue pembrolizumab as her brain lesions were treated.  2. Peripheral neuropathy: -Continue gabapentin 300 mg twice daily.  3. Left basilic vein thrombus: -Continue Eliquis.  No bleeding issues.  4. Thyroid abnormality: -Last TSH was 5.35.  We will closely monitor.  5.  Brain metastasis: -XRT completed on 05/24/2020.  She is on tapering doses of dexamethasone which will be discontinued end of August.   Orders placed this encounter:  No orders of the defined types were placed in this encounter.    Derek Jack, MD [place] [phone]   I, Milinda Antis, am acting as a scribe for Dr. Sanda Linger.  I, Derek Jack MD, have reviewed the above documentation for accuracy and completeness, and I agree with the above.

## 2020-06-11 NOTE — Progress Notes (Signed)
  Radiation Oncology         [phone] ________________________________  Name: Tara Aguilar MRN: [id number]  Date: 05/24/2020  DOB: Sep 12, 1981  End of Treatment Note  Diagnosis:   stage IV non-small cell lung cancer     Indication for treatment:  palliative       Radiation treatment dates:   05/18/20 - 05/24/20  Site/dose:   27 Gy in 3 fractions ExacTrac, 6 vmat beams Max dose=123.6% PTV1 LtPar33mm PTV2 LtFront52mm PTV3 LtTemp32mm  Narrative: The patient tolerated radiation treatment well.   There were no signs of acute toxicity after treatment.  Plan: The patient has completed radiation treatment. The patient will return to radiation oncology clinic for routine followup in one month. I advised the patient to call or return sooner if they have any questions or concerns related to their recovery or treatment. ________________________________  ------------------------------------------------  Jodelle Gross, MD, PhD

## 2020-06-12 ENCOUNTER — Ambulatory Visit (HOSPITAL_COMMUNITY)
Admission: RE | Admit: 2020-06-12 | Discharge: 2020-06-12 | Disposition: A | Discharge status: Home / Self Care | Payer: BC Managed Care – PPO | Source: Ambulatory Visit | Attending: Hematology | Admitting: Hematology

## 2020-06-12 ENCOUNTER — Other Ambulatory Visit: Payer: Self-pay

## 2020-06-12 DIAGNOSIS — C3490 Malignant neoplasm of unspecified part of unspecified bronchus or lung: Secondary | ICD-10-CM | POA: Diagnosis present

## 2020-06-12 MED ORDER — IOHEXOL 300 MG/ML  SOLN
75.0000 mL | Freq: Once | INTRAMUSCULAR | Status: AC | PRN
  Administered 2020-06-12: 75 mL via INTRAVENOUS

## 2020-06-14 ENCOUNTER — Inpatient Hospital Stay (HOSPITAL_COMMUNITY): Payer: BC Managed Care – PPO

## 2020-06-14 ENCOUNTER — Other Ambulatory Visit: Payer: Self-pay

## 2020-06-14 ENCOUNTER — Inpatient Hospital Stay (HOSPITAL_COMMUNITY): Payer: BC Managed Care – PPO | Attending: Hematology | Admitting: Hematology

## 2020-06-14 VITALS — BP 139/88 | HR 120 | Temp 97.1°F | Resp 19 | Wt 299.1 lb

## 2020-06-14 DIAGNOSIS — C3490 Malignant neoplasm of unspecified part of unspecified bronchus or lung: Secondary | ICD-10-CM

## 2020-06-14 DIAGNOSIS — F17210 Nicotine dependence, cigarettes, uncomplicated: Secondary | ICD-10-CM | POA: Insufficient documentation

## 2020-06-14 DIAGNOSIS — C7970 Secondary malignant neoplasm of unspecified adrenal gland: Secondary | ICD-10-CM | POA: Diagnosis not present

## 2020-06-14 DIAGNOSIS — Z79899 Other long term (current) drug therapy: Secondary | ICD-10-CM | POA: Diagnosis not present

## 2020-06-14 DIAGNOSIS — Z7901 Long term (current) use of anticoagulants: Secondary | ICD-10-CM | POA: Insufficient documentation

## 2020-06-14 LAB — COMPREHENSIVE METABOLIC PANEL
ALT: 163 U/L — ABNORMAL HIGH (ref 0–44)
AST: 27 U/L (ref 15–41)
Albumin: 3.2 g/dL — ABNORMAL LOW (ref 3.5–5.0)
Alkaline Phosphatase: 117 U/L (ref 38–126)
Anion gap: 12 (ref 5–15)
BUN: 21 mg/dL — ABNORMAL HIGH (ref 6–20)
CO2: 28 mmol/L (ref 22–32)
Calcium: 9.3 mg/dL (ref 8.9–10.3)
Chloride: 97 mmol/L — ABNORMAL LOW (ref 98–111)
Creatinine, Ser: 0.9 mg/dL (ref 0.44–1.00)
GFR calc Af Amer: >60 mL/min (ref >60)
GFR calc non Af Amer: >60 mL/min (ref >60)
Glucose, Bld: 454 mg/dL — ABNORMAL HIGH (ref 70–99)
Potassium: 3.9 mmol/L (ref 3.5–5.1)
Sodium: 137 mmol/L (ref 135–145)
Total Bilirubin: 0.7 mg/dL (ref 0.3–1.2)
Total Protein: 6.4 g/dL — ABNORMAL LOW (ref 6.5–8.1)

## 2020-06-14 LAB — CBC WITH DIFFERENTIAL/PLATELET
Abs Immature Granulocytes: 0.3 10*3/uL — ABNORMAL HIGH (ref 0.00–0.07)
Basophils Absolute: 0.1 10*3/uL (ref 0.0–0.1)
Basophils Relative: 1 %
Eosinophils Absolute: 0 10*3/uL (ref 0.0–0.5)
Eosinophils Relative: 0 %
HCT: 45.3 % (ref 36.0–46.0)
Hemoglobin: 14.5 g/dL (ref 12.0–15.0)
Immature Granulocytes: 6 %
Lymphocytes Relative: 18 %
Lymphs Abs: 1 10*3/uL (ref 0.7–4.0)
MCH: 31.2 pg (ref 26.0–34.0)
MCHC: 32 g/dL (ref 30.0–36.0)
MCV: 97.4 fL (ref 80.0–100.0)
Monocytes Absolute: 0.3 10*3/uL (ref 0.1–1.0)
Monocytes Relative: 6 %
Neutro Abs: 3.5 10*3/uL (ref 1.7–7.7)
Neutrophils Relative %: 69 %
Platelets: 142 10*3/uL — ABNORMAL LOW (ref 150–400)
RBC: 4.65 MIL/uL (ref 3.87–5.11)
RDW: 17.2 % — ABNORMAL HIGH (ref 11.5–15.5)
WBC: 5.2 10*3/uL (ref 4.0–10.5)
nRBC: 0 % (ref 0.0–0.2)

## 2020-06-14 NOTE — Patient Instructions (Signed)
[facility] °Discharge Instructions ° °You were seen today by Dr. Katragadda. He went over your recent results. Dr. Katragadda will see you back in 2 weeks for labs and follow up. ° ° °Thank you for choosing [facility] at [facility] to provide your oncology and hematology care.  To afford each patient quality time with our provider, please arrive at least 15 minutes before your scheduled appointment time.  ° °If you have a lab appointment with the Cancer Center please come in thru the Main Entrance and check in at the main information desk ° °You need to re-schedule your appointment should you arrive 10 or more minutes late.  We strive to give you quality time with our providers, and arriving late affects you and other patients whose appointments are after yours.  Also, if you no show three or more times for appointments you may be dismissed from the clinic at the providers discretion.     °Again, thank you for choosing [facility].  Our hope is that these requests will decrease the amount of time that you wait before being seen by our physicians.       °_____________________________________________________________ ° °Should you have questions after your visit to [facility], please contact our office at [phone] between the hours of 8:00 a.m. and 4:30 p.m.  Voicemails left after 4:00 p.m. will not be returned until the following business day.  For prescription refill requests, have your pharmacy contact our office and allow 72 hours.   ° °Cancer Center Support Programs:  ° °> Cancer Support Group  °2nd Tuesday of the month 1pm-2pm, Journey Room  ° ° °

## 2020-06-14 NOTE — Progress Notes (Signed)
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  PRIOR THERAPY: Carboplatin, paclitaxel and Keytruda x 7 cycles from 04/08/2019 to 08/16/2019  NGS Results: Not done  CURRENT THERAPY: Keytruda every 3 weeks  BRIEF ONCOLOGIC HISTORY:  Oncology History  Squamous cell lung cancer, unspecified laterality ([facility])  04/05/2019 Initial Diagnosis   Squamous cell lung cancer, unspecified laterality ([place])   04/08/2019 -  Chemotherapy   The patient had palonosetron (ALOXI) injection 0.25 mg, 0.25 mg, Intravenous,  Once, 7 of 7 cycles Administration: 0.25 mg (04/08/2019), 0.25 mg (05/02/2019), 0.25 mg (05/23/2019), 0.25 mg (06/13/2019), 0.25 mg (07/05/2019), 0.25 mg (07/26/2019), 0.25 mg (08/16/2019) pegfilgrastim (NEULASTA) injection 6 mg, 6 mg, Subcutaneous, Once, 2 of 2 cycles Administration: 6 mg (04/11/2019), 6 mg (05/04/2019) pegfilgrastim (NEULASTA ONPRO KIT) injection 6 mg, 6 mg, Subcutaneous, Once, 3 of 3 cycles pegfilgrastim-cbqv (UDENYCA) injection 6 mg, 6 mg, Subcutaneous, Once, 5 of 5 cycles Administration: 6 mg (05/25/2019), 6 mg (06/15/2019), 6 mg (07/07/2019), 6 mg (07/28/2019), 6 mg (08/18/2019) CARBOplatin (PARAPLATIN) 900 mg in sodium chloride 0.9 % 500 mL chemo infusion, 900 mg (100 % of original dose 900 mg), Intravenous,  Once, 7 of 7 cycles Dose modification:   (original dose 900 mg, Cycle 1),   (original dose 900 mg, Cycle 2),   (original dose 900 mg, Cycle 3),   (original dose 900 mg, Cycle 4) Administration: 900 mg (04/08/2019), 900 mg (05/02/2019), 900 mg (05/23/2019), 900 mg (06/13/2019), 900 mg (07/05/2019), 900 mg (07/26/2019), 900 mg (08/16/2019) PACLitaxel (TAXOL) 492 mg in sodium chloride 0.9 % 500 mL chemo infusion (> $RemoveBef'80mg'OddRaXynrq$ /m2), 200 mg/m2 = 492 mg, Intravenous,  Once, 7 of 7  cycles Dose modification: 175 mg/m2 (original dose 200 mg/m2, Cycle 6, Reason: Other (see comments), Comment: neuropathy) Administration: 492 mg (04/08/2019), 492 mg (05/02/2019), 492 mg (05/23/2019), 492 mg (06/13/2019), 492 mg (07/05/2019), 432 mg (07/26/2019), 432 mg (08/16/2019) pembrolizumab (KEYTRUDA) 200 mg in sodium chloride 0.9 % 50 mL chemo infusion, 200 mg, Intravenous, Once, 18 of 21 cycles Administration: 200 mg (04/08/2019), 200 mg (07/05/2019), 200 mg (05/02/2019), 200 mg (05/23/2019), 200 mg (06/13/2019), 200 mg (07/26/2019), 200 mg (08/16/2019), 200 mg (09/06/2019), 200 mg (09/27/2019), 200 mg (10/19/2019), 200 mg (11/09/2019), 200 mg (11/30/2019), 200 mg (12/21/2019), 200 mg (01/12/2020), 200 mg (02/07/2020), 200 mg (02/28/2020), 200 mg (03/21/2020), 200 mg (04/11/2020) fosaprepitant (EMEND) 150 mg, dexamethasone (DECADRON) 12 mg in sodium chloride 0.9 % 145 mL IVPB, , Intravenous,  Once, 7 of 7 cycles Administration:  (04/08/2019),  (05/02/2019),  (05/23/2019),  (06/13/2019),  (07/05/2019),  (07/26/2019),  (08/16/2019)  for chemotherapy treatment.      CANCER STAGING: Cancer Staging No matching staging information was found for the patient.  INTERVAL HISTORY:  Tara Aguilar, a 39 y.o. 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 Tara Aguilar was last seen on 05/31/2020.  Today she reports feeling tired. She is taking Decadron 2 mg once daily. Her cough is stable. She also reports feeling more swelling since starting finishing radiation therapy from 7/23 to 7/29. Her energy levels are lower and she is napping more. Her appetite and taste are good; she is trying to eat better and drink more water daily.   REVIEW OF SYSTEMS:  Review of Systems  Constitutional: Positive for fatigue (moderate). Negative for appetite change.  Respiratory: Positive for cough and shortness of breath.   Neurological: Positive for numbness (fingers).  All other systems reviewed and  are negative.   PAST MEDICAL/SURGICAL HISTORY:  Past Medical History:  Diagnosis Date  . Cancer ([facility])   . Depression   . Glucose intolerance 03/11/2019  . Obesity, Class III, BMI 40-49.9 (morbid obesity) ([facility]) 03/11/2019   Past Surgical History:  Procedure Laterality Date  . CESAREAN SECTION    . IRRIGATION AND DEBRIDEMENT ABSCESS  03/15/2019   RETROPHARYNGEAL   . TONSILLECTOMY AND ADENOIDECTOMY N/A 03/15/2019   Procedure: IRRIGATION AND DEBRIDEMENT OF RETROPHARYNGEAL ABSCESS;  Surgeon: Leta Baptist, MD;  Location: [facility];  Service: ENT;  Laterality: N/A;    SOCIAL HISTORY:  Social History   Socioeconomic History  . Marital status: Widowed    Spouse name: Not on file  . Number of children: 1  . Years of education: Not on file  . Highest education level: Not on file  Occupational History  . Not on file  Tobacco Use  . Smoking status: Current Some Day Smoker    Packs/day: 1.00    Years: 25.00    Pack years: 25.00    Types: Cigarettes  . Smokeless tobacco: Never Used  Vaping Use  . Vaping Use: Never used  Substance and Sexual Activity  . Alcohol use: Not Currently  . Drug use: Not Currently  . Sexual activity: Not on file  Other Topics Concern  . Not on file  Social History Narrative   2019/03/31   Husband passed away four months ago from Cancer of the [place].   Pt. Has 8 yo son at home.   Social Determinants of Health   Financial Resource Strain:   . Difficulty of Paying Living Expenses: Not on file  Food Insecurity:   . Worried About Charity fundraiser in the Last Year: Not on file  . Ran Out of Food in the Last Year: Not on file  Transportation Needs:   . Lack of Transportation (Medical): Not on file  . Lack of Transportation (Non-Medical): Not on file  Physical Activity:   . Days of Exercise per Week: Not on file  . Minutes of Exercise per Session: Not on file  Stress:   . Feeling of Stress : Not on file  Social Connections:   . Frequency of Communication  with Friends and Family: Not on file  . Frequency of Social Gatherings with Friends and Family: Not on file  . Attends Religious Services: Not on file  . Active Member of Clubs or Organizations: Not on file  . Attends Archivist Meetings: Not on file  . Marital Status: Not on file  Intimate Partner Violence:   . Fear of Current or Ex-Partner: Not on file  . Emotionally Abused: Not on file  . Physically Abused: Not on file  . Sexually Abused: Not on file    FAMILY HISTORY:  Family History  Problem Relation Age of Onset  . Lung cancer Mother   . Hypertension Mother   . Leukemia Father   . Myelodysplastic syndrome Father     CURRENT MEDICATIONS:  Current Outpatient Medications  Medication Sig Dispense Refill  . apixaban (ELIQUIS) 5 MG TABS tablet Take 1 tablet (5 mg total) by mouth 2 (two) times daily. 60 tablet 5  . dexamethasone (DECADRON) 4 MG tablet Take 1 tablet (4 mg total) by mouth 3 (  three) times daily. 90 tablet 1  . gabapentin (NEURONTIN) 300 MG capsule Take 1 capsule (300 mg total) by mouth 3 (three) times daily. Take 300 mg in the morning, 300 mg at noon and 600 mg at bedtime. 120 capsule 2  . albuterol (PROVENTIL) (2.5 MG/3ML) 0.083% nebulizer solution Take 3 mLs (2.5 mg total) by nebulization every 2 (two) hours as needed for wheezing. (Patient not taking: Reported on 06/14/2020) 75 mL 12  . albuterol (VENTOLIN HFA) 108 (90 Base) MCG/ACT inhaler Inhale 2 puffs into the lungs every 6 (six) hours as needed for wheezing or shortness of breath. (Patient not taking: Reported on 06/14/2020) 18 g 3  . methocarbamol (ROBAXIN) 750 MG tablet Take 1 tablet (750 mg total) by mouth 3 (three) times daily. 90 tablet 1  . Multiple Vitamin (MULTIVITAMIN WITH MINERALS) TABS tablet Take 1 tablet by mouth daily. 120 tablet 2  . Nebulizers (COMPRESSOR/NEBULIZER) MISC 1 Units by Does not apply route as needed. Nebulizer treatments as needed for shortness of breath cough and wheezing  (Patient not taking: Reported on 06/14/2020) 1 each 0  . oxyCODONE (OXY IR/ROXICODONE) 5 MG immediate release tablet Take 1 tablet (5 mg total) by mouth every 4 (four) hours as needed for severe pain. (Patient not taking: Reported on 06/14/2020) 90 tablet 0  . prochlorperazine (COMPAZINE) 10 MG tablet Take 1 tablet (10 mg total) by mouth every 6 (six) hours as needed (Nausea or vomiting). (Patient not taking: Reported on 06/14/2020) 30 tablet 1  . tiotropium (SPIRIVA HANDIHALER) 18 MCG inhalation capsule Place 1 capsule (18 mcg total) into inhaler and inhale daily. 30 capsule 2   No current facility-administered medications for this visit.    ALLERGIES:  No Known Allergies  PHYSICAL EXAM:  Performance status (ECOG): 1 - Symptomatic but completely ambulatory  Vitals:   06/14/20 1002  BP: 139/88  Pulse: (!) 120  Resp: 19  Temp: (!) 97.1 F (36.2 C)  SpO2: 94%   Wt Readings from Last 3 Encounters:  06/14/20 299 lb 1.6 oz (135.7 kg)  05/31/20 292 lb 8 oz (132.7 kg)  05/16/20 295 lb 3.2 oz (133.9 kg)   Physical Exam Vitals reviewed.  Constitutional:      Appearance: Normal appearance. She is obese.  Cardiovascular:     Rate and Rhythm: Normal rate and regular rhythm.     Pulses: Normal pulses.     Heart sounds: Normal heart sounds.  Pulmonary:     Effort: Pulmonary effort is normal.     Breath sounds: Normal breath sounds.  Musculoskeletal:     Right lower leg: Edema (trace) present.     Left lower leg: Edema (trace) present.  Neurological:     General: No focal deficit present.     Mental Status: She is alert and oriented to person, place, and time.  Psychiatric:        Mood and Affect: Mood normal.        Behavior: Behavior normal.      LABORATORY DATA:  I have reviewed the labs as listed.  CBC Latest Ref Rng & Units 06/14/2020 05/29/2020 05/16/2020  WBC 4.0 - 10.5 K/uL 5.2 10.8(H) 21.0(H)  Hemoglobin 12.0 - 15.0 g/dL 14.5 15.8(H) 16.5(H)  Hematocrit 36 - 46 % 45.3  50.4(H) 51.3(H)  Platelets 150 - 400 K/uL 142(L) 145(L) 207   CMP Latest Ref Rng & Units 06/14/2020 05/29/2020 05/16/2020  Glucose 70 - 99 mg/dL 454(H) 293(H) 223(H)  BUN 6 - 20 mg/dL 21(H)  38(H) 29(H)  Creatinine 0.44 - 1.00 mg/dL 0.90 0.94 1.22(H)  Sodium 135 - 145 mmol/L 137 139 137  Potassium 3.5 - 5.1 mmol/L 3.9 5.3(H) 4.5  Chloride 98 - 111 mmol/L 97(L) 98 98  CO2 22 - 32 mmol/L $RemoveB'28 31 30  'EjFOAYQo$ Calcium 8.9 - 10.3 mg/dL 9.3 9.7 8.6(L)  Total Protein 6.5 - 8.1 g/dL 6.4(L) 7.0 6.6  Total Bilirubin 0.3 - 1.2 mg/dL 0.7 1.1 1.2  Alkaline Phos 38 - 126 U/L 117 103 88  AST 15 - 41 U/L 27 92(H) 117(H)  ALT 0 - 44 U/L 163(H) 571(H) 632(H)    DIAGNOSTIC IMAGING:  I have independently reviewed the scans and discussed with the patient. CT Chest W Contrast  Result Date: 06/13/2020 CLINICAL DATA:  Lung cancer, history of head/neck cancer. Last radiation treatment 2 weeks ago. EXAM: CT CHEST WITH CONTRAST TECHNIQUE: Multidetector CT imaging of the chest was performed during intravenous contrast administration. CONTRAST:  41mL OMNIPAQUE IOHEXOL 300 MG/ML  SOLN COMPARISON:  CT abdomen pelvis 05/29/2020 and CT chest 03/28/2020. FINDINGS: Cardiovascular: Heart is at the upper limits of normal in size. No pericardial effusion. Mediastinum/Nodes: There is soft tissue asymmetry anterior to the right anterior scalene musculature (2/6) as well as right paratracheal station, with associated venous collaterals, similar to the prior exam. No discrete pathologically enlarged mediastinal, hilar or axillary lymph nodes. Esophagus is unremarkable. Lungs/Pleura: Linear densities in the medial aspect of the upper right hemithorax are indicative of radiation therapy. Previously seen diffuse pulmonary parenchymal micronodularity has nearly completely resolved in the interval. Chronic appearing volume loss in the right middle and right lower lobes, adjacent to an elevated right hemidiaphragm. No pleural fluid. Airway is unremarkable.  Upper Abdomen: Hyperdense collateral perfusion is seen in the left hepatic lobe. Visualized portions of the liver, gallbladder, adrenal glands, kidneys, spleen, pancreas, stomach and bowel are otherwise grossly unremarkable. Musculoskeletal: Degenerative changes in the spine. No worrisome lytic or sclerotic lesions. IMPRESSION: 1. Near complete resolution of previously seen diffuse micronodularity, indicative of resolving metastatic disease. 2. Asymmetric right thoracic inlet and right paratracheal soft tissue with associated collateral venous flow, stable. Electronically Signed   By: Lorin Picket M.D.   On: 06/13/2020 09:33   CT Abdomen Pelvis W Contrast  Result Date: 05/30/2020 CLINICAL DATA:  Non-small cell lung cancer. History of head neck cancer. Brain metastasis demonstrated on recent MRI EXAM: CT ABDOMEN AND PELVIS WITH CONTRAST TECHNIQUE: Multidetector CT imaging of the abdomen and pelvis was performed using the standard protocol following bolus administration of intravenous contrast. CONTRAST:  123mL OMNIPAQUE IOHEXOL 300 MG/ML  SOLN COMPARISON:  CT abdomen 02/03/2020 FINDINGS: Lower chest: Atelectasis in the RIGHT middle lobe. No discrete nodularity at the lung bases. Some motion degradation. Hepatobiliary: Segmental vascular enhancement in the central LEFT hepatic lobe is a benign vascular phenomena related SVC obstruction. No enhancing hepatic lesion. Gallbladder normal. Pancreas: Pancreas is normal. No ductal dilatation. No pancreatic inflammation. Spleen: Normal spleen Adrenals/urinary tract: Adrenal glands and kidneys are normal. The ureters and bladder normal. Stomach/Bowel: Stomach, small bowel, appendix, and cecum are normal. The colon and rectosigmoid colon are normal. Vascular/Lymphatic: Abdominal aorta is normal caliber. No periportal or retroperitoneal adenopathy. No pelvic adenopathy. Venous collaterals in the anterior chest abdominal related to SVC obstruction. Reproductive: Uterus and  ovaries normal. Other: No free fluid. Musculoskeletal: No aggressive osseous lesion. IMPRESSION: 1. No evidence of metastatic disease in the abdomen or pelvis. 2. No discrete nodularity demonstrate at the lung bases. Some motion degradation.  Electronically Signed   By: Suzy Bouchard M.D.   On: 05/30/2020 08:32     ASSESSMENT:  1. 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 -Maintenance pembrolizumab started on 09/06/2019. -MRI brain from 02/03/2020 showed 4 mm left parietal nodular enhancement. Differential includes metastatic versus subacute infarction. -CT CAP on 02/03/2020 showed new nodularity in the bilateral upper lungs measuring 6 mm. Ill-defined soft tissue in the medial right supraclavicular region and right paratracheal region stable versus mildly improved. Stable mild axillary lymphadenopathy, right greater than left. No suspicious findings in the abdomen or pelvis. -CT chest with contrast on 03/28/2020 showed subpleural groundglass opacity in the left lower lobe consistent with atelectasis/infection. Bilateral micronodularity, mostly in the upper part of the lungs progressive from 02/03/2020. -CT AP on 05/29/2020 did not show any evidence of metastatic disease in the abdomen or pelvis. -CT chest on 06/12/2020 shows near complete resolution of previously seen diffuse micronodularity.  Asymmetric right thoracic inlet and right paratracheal soft tissue stable.  2. Left basilic vein thrombus: -Ultrasound on 05/03/2019 showed nonocclusive thrombus around the PICC line in the left basilic vein considered superficial thrombus with no evidence of DVT. -Because of her metastatic cancer, she was told to continue Eliquis.  3. Brain metastasis: -MRI of the brain on 04/26/2020 showed left parietal lesion measuring 2.0 x 1.6 cm. Left frontal enhancing lesion measures 1.2 x 0.9 cm. 1.7 x 1  point centimeters enhancing dorsal left frontal/superior temporal lesion present. -SRS to the brain lesions completed on 05/24/2020.   PLAN:  1. Metastatic squamous cell carcinoma of the lung: -I have reviewed CT scan of the chest results which showed complete resolution of micro nodularity which is great news. -We can resume her pembrolizumab once she is off of dexamethasone. -I plan to see her back in 2 weeks for restarting pembrolizumab. -I have reviewed her labs today which showed glucose is elevated at 454.  Albumin is 3.2 and ALT is 163 but has improved.  2. Peripheral neuropathy: -Continue gabapentin 300 mg twice daily.  3. Left basilic vein thrombus: -Continue Eliquis.  No bleeding issues.  4. Thyroid abnormality: -Last TSH was 5.35.  We will closely monitor.  5.  Brain metastasis: -Completed SRS on 05/24/2020. -She is currently on dexamethasone 2 mg daily.  She has some patchy hair loss.   Orders placed this encounter:  No orders of the defined types were placed in this encounter.    Derek Jack, MD [place] [phone]   I, Milinda Antis, am acting as a scribe for Dr. Sanda Linger.  I, Derek Jack MD, have reviewed the above documentation for accuracy and completeness, and I agree with the above.

## 2020-06-15 ENCOUNTER — Other Ambulatory Visit (HOSPITAL_COMMUNITY): Payer: Self-pay | Admitting: *Deleted

## 2020-06-15 DIAGNOSIS — C3490 Malignant neoplasm of unspecified part of unspecified bronchus or lung: Secondary | ICD-10-CM

## 2020-06-25 ENCOUNTER — Other Ambulatory Visit: Payer: Self-pay | Admitting: Radiation Therapy

## 2020-06-25 ENCOUNTER — Other Ambulatory Visit: Payer: Self-pay

## 2020-06-25 ENCOUNTER — Ambulatory Visit
Admission: RE | Admit: 2020-06-25 | Discharge: 2020-06-25 | Disposition: A | Discharge status: Home / Self Care | Payer: BC Managed Care – PPO | Source: Ambulatory Visit | Attending: Radiation Oncology | Admitting: Radiation Oncology

## 2020-06-25 DIAGNOSIS — C7931 Secondary malignant neoplasm of brain: Secondary | ICD-10-CM

## 2020-06-25 DIAGNOSIS — C7949 Secondary malignant neoplasm of other parts of nervous system: Secondary | ICD-10-CM

## 2020-06-25 DIAGNOSIS — C3490 Malignant neoplasm of unspecified part of unspecified bronchus or lung: Secondary | ICD-10-CM

## 2020-06-25 NOTE — Progress Notes (Signed)
  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 ExacTrac, 6 vmat beams, Max dose=123.6% PTV1 LtPar46mm PTV2 LtFront51mm PTV3 LtTemp16mm  03/17/19-03/31/19:  30 Gy in 10 fractions to the RUL target  Narrative:  The patient was contacted today for routine follow-up. During treatment she did very well with radiotherapy and did not have significant desquamation. She was given steroid taper instructions the day of her treatment and did not call with concerns following therapy.  Impression/Plan: 1. 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 I was unable to reach the patient so instead I left a voicemail and on it discussed that we would plan to proceed with routine brain imaging with an MRI in about 2 months. She was given contact information to reach [place] if she'd like to discuss further. She will also continue to follow up with Dr. Delton Coombes in medical oncology.     Carola Rhine, PAC

## 2020-06-27 ENCOUNTER — Other Ambulatory Visit (HOSPITAL_COMMUNITY): Payer: Self-pay | Admitting: *Deleted

## 2020-06-27 ENCOUNTER — Other Ambulatory Visit (HOSPITAL_COMMUNITY): Payer: Self-pay | Admitting: Nurse Practitioner

## 2020-06-27 ENCOUNTER — Encounter: Payer: Self-pay | Admitting: Radiation Oncology

## 2020-06-27 ENCOUNTER — Encounter (HOSPITAL_COMMUNITY): Payer: Self-pay

## 2020-06-27 MED ORDER — CEPHALEXIN 500 MG PO CAPS
500.0000 mg | ORAL_CAPSULE | Freq: Two times a day (BID) | ORAL | 0 refills | Status: DC
Start: 2020-06-27 — End: 2020-07-12

## 2020-07-03 ENCOUNTER — Telehealth: Payer: Self-pay | Admitting: Radiation Oncology

## 2020-07-03 ENCOUNTER — Other Ambulatory Visit: Payer: Self-pay | Admitting: Radiation Oncology

## 2020-07-03 MED ORDER — DEXAMETHASONE 4 MG PO TABS: ORAL_TABLET | ORAL | 0 refills | Status: DC

## 2020-07-03 NOTE — Telephone Encounter (Signed)
The patient called me and I returned her phone call, she states that she has had approximately 1 week of word finding difficulty and right upper extremity weakness that is similar to what she noticed prior to her radiation treatment.  She was concerned that this could be a possible side effect of radiotherapy to her brain.  In discussing her medication, she was given a taper instruction from her dexamethasone, it appears that she had run out of her dexamethasone before completing the taper prior to getting to 2 mg every other day dosing, the last time she remembers taking medication was approximately August 20 which should have been a 2 mg/day dose.  We discussed that if she is also going to be resuming Keytruda, that I would need to reach out to Dr. Delton Coombes.  My hope is that we could get resolution of her symptoms with the lowest possible dose of steroid to try and reduce interaction with Keytruda/delay in starting that as to Dr. Tomie China preference.  A new prescription was sent into her pharmacy.  I will follow-up with her later this week to see how she is doing prior to making any further adjustments.    Carola Rhine, PAC

## 2020-07-04 ENCOUNTER — Inpatient Hospital Stay (HOSPITAL_COMMUNITY)
Admission: AD | Admit: 2020-07-04 | Discharge: 2020-07-12 | DRG: 054 | Disposition: A | Discharge status: Hospice | Payer: BC Managed Care – PPO | Source: Other Acute Inpatient Hospital | Attending: Internal Medicine | Admitting: Internal Medicine

## 2020-07-04 ENCOUNTER — Encounter (HOSPITAL_COMMUNITY): Payer: Self-pay | Admitting: Internal Medicine

## 2020-07-04 ENCOUNTER — Inpatient Hospital Stay (HOSPITAL_COMMUNITY): Payer: BC Managed Care – PPO

## 2020-07-04 ENCOUNTER — Inpatient Hospital Stay (HOSPITAL_COMMUNITY): Payer: BC Managed Care – PPO | Admitting: Hematology

## 2020-07-04 DIAGNOSIS — E1142 Type 2 diabetes mellitus with diabetic polyneuropathy: Secondary | ICD-10-CM | POA: Diagnosis present

## 2020-07-04 DIAGNOSIS — Z515 Encounter for palliative care: Secondary | ICD-10-CM | POA: Diagnosis not present

## 2020-07-04 DIAGNOSIS — Z801 Family history of malignant neoplasm of trachea, bronchus and lung: Secondary | ICD-10-CM

## 2020-07-04 DIAGNOSIS — Z66 Do not resuscitate: Secondary | ICD-10-CM | POA: Diagnosis not present

## 2020-07-04 DIAGNOSIS — Z20822 Contact with and (suspected) exposure to covid-19: Secondary | ICD-10-CM | POA: Diagnosis present

## 2020-07-04 DIAGNOSIS — R739 Hyperglycemia, unspecified: Secondary | ICD-10-CM | POA: Diagnosis not present

## 2020-07-04 DIAGNOSIS — D72829 Elevated white blood cell count, unspecified: Secondary | ICD-10-CM | POA: Diagnosis present

## 2020-07-04 DIAGNOSIS — Z79899 Other long term (current) drug therapy: Secondary | ICD-10-CM

## 2020-07-04 DIAGNOSIS — R29810 Facial weakness: Secondary | ICD-10-CM | POA: Diagnosis present

## 2020-07-04 DIAGNOSIS — Z86718 Personal history of other venous thrombosis and embolism: Secondary | ICD-10-CM | POA: Diagnosis not present

## 2020-07-04 DIAGNOSIS — E1165 Type 2 diabetes mellitus with hyperglycemia: Secondary | ICD-10-CM | POA: Diagnosis present

## 2020-07-04 DIAGNOSIS — Z7901 Long term (current) use of anticoagulants: Secondary | ICD-10-CM | POA: Diagnosis not present

## 2020-07-04 DIAGNOSIS — E875 Hyperkalemia: Secondary | ICD-10-CM | POA: Diagnosis present

## 2020-07-04 DIAGNOSIS — C7931 Secondary malignant neoplasm of brain: Principal | ICD-10-CM | POA: Diagnosis present

## 2020-07-04 DIAGNOSIS — E7439 Other disorders of intestinal carbohydrate absorption: Secondary | ICD-10-CM | POA: Diagnosis present

## 2020-07-04 DIAGNOSIS — Z923 Personal history of irradiation: Secondary | ICD-10-CM

## 2020-07-04 DIAGNOSIS — Y92239 Unspecified place in hospital as the place of occurrence of the external cause: Secondary | ICD-10-CM | POA: Diagnosis present

## 2020-07-04 DIAGNOSIS — C7970 Secondary malignant neoplasm of unspecified adrenal gland: Secondary | ICD-10-CM | POA: Diagnosis present

## 2020-07-04 DIAGNOSIS — G936 Cerebral edema: Secondary | ICD-10-CM | POA: Diagnosis present

## 2020-07-04 DIAGNOSIS — G629 Polyneuropathy, unspecified: Secondary | ICD-10-CM

## 2020-07-04 DIAGNOSIS — T380X5A Adverse effect of glucocorticoids and synthetic analogues, initial encounter: Secondary | ICD-10-CM | POA: Diagnosis present

## 2020-07-04 DIAGNOSIS — Z7189 Other specified counseling: Secondary | ICD-10-CM | POA: Diagnosis not present

## 2020-07-04 DIAGNOSIS — M7989 Other specified soft tissue disorders: Secondary | ICD-10-CM | POA: Diagnosis not present

## 2020-07-04 DIAGNOSIS — Z8249 Family history of ischemic heart disease and other diseases of the circulatory system: Secondary | ICD-10-CM

## 2020-07-04 DIAGNOSIS — F17210 Nicotine dependence, cigarettes, uncomplicated: Secondary | ICD-10-CM | POA: Diagnosis present

## 2020-07-04 DIAGNOSIS — R4781 Slurred speech: Secondary | ICD-10-CM | POA: Diagnosis present

## 2020-07-04 DIAGNOSIS — Z6841 Body Mass Index (BMI) 40.0 and over, adult: Secondary | ICD-10-CM

## 2020-07-04 DIAGNOSIS — Z806 Family history of leukemia: Secondary | ICD-10-CM | POA: Diagnosis not present

## 2020-07-04 DIAGNOSIS — R569 Unspecified convulsions: Secondary | ICD-10-CM | POA: Diagnosis present

## 2020-07-04 DIAGNOSIS — Z23 Encounter for immunization: Secondary | ICD-10-CM | POA: Diagnosis not present

## 2020-07-04 DIAGNOSIS — C3490 Malignant neoplasm of unspecified part of unspecified bronchus or lung: Secondary | ICD-10-CM | POA: Diagnosis present

## 2020-07-04 LAB — MRSA PCR SCREENING: MRSA by PCR: NEGATIVE

## 2020-07-04 MED ORDER — ORAL CARE MOUTH RINSE
15.0000 mL | Freq: Two times a day (BID) | OROMUCOSAL | Status: DC
  Administered 2020-07-04: 15 mL via OROMUCOSAL

## 2020-07-04 MED ORDER — ONDANSETRON HCL 4 MG/2ML IJ SOLN: 4.0000 mg | Freq: Four times a day (QID) | INTRAMUSCULAR | Status: DC | PRN

## 2020-07-04 MED ORDER — CHLORHEXIDINE GLUCONATE CLOTH 2 % EX PADS
6.0000 | MEDICATED_PAD | Freq: Every day | CUTANEOUS | Status: DC
  Administered 2020-07-04 – 2020-07-08 (×4): 6 via TOPICAL

## 2020-07-04 MED ORDER — ONDANSETRON HCL 4 MG PO TABS: 4.0000 mg | ORAL_TABLET | Freq: Four times a day (QID) | ORAL | Status: DC | PRN

## 2020-07-04 MED ORDER — DEXAMETHASONE SODIUM PHOSPHATE 10 MG/ML IJ SOLN
6.0000 mg | Freq: Four times a day (QID) | INTRAMUSCULAR | Status: DC
  Administered 2020-07-05 – 2020-07-07 (×10): 6 mg via INTRAVENOUS
  Filled 2020-07-04 (×10): qty 1

## 2020-07-04 NOTE — H&P (Addendum)
History and Physical    Tara Aguilar VOZ:[id number] DOB: February 11, 1981 DOA: 07/04/2020  PCP: Glenda Chroman, MD  Patient coming from: Patient was transferred from [facility].  Chief Complaint: Altered mental status seizures.  HPI: Tara Aguilar is a 39 y.o. 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  Patient had a CT of the head which showed 2.4 cm mass in the left frontoparietal area with possible hemorrhage and the edema of the left cerebral area with effacement of the left lateral ventricle.  Patient was started on Keppra and Decadron and discussed with patient's oncologist who advised to transfer patient was sent down.  Labs are showing elevated lactic acid around 4 and elevated blood sugar.  The time of my exam patient is signed.  Right facial droop and slight slurring of speech mild weakness of the right upper extremity around 4 x 5.  Patient otherwise is oriented to person and place.  ED Course: Patient is a direct admit.  Review of Systems: As per HPI, rest all negative.   Past Medical History:  Diagnosis Date  . Cancer ([place])   . Depression   . Glucose intolerance 03/11/2019  . Obesity, Class III, BMI 40-49.9 (morbid obesity) ([place]) 03/11/2019    Past Surgical History:  Procedure Laterality Date  . CESAREAN SECTION    . IRRIGATION AND DEBRIDEMENT ABSCESS  03/15/2019   RETROPHARYNGEAL   . TONSILLECTOMY AND ADENOIDECTOMY N/A 03/15/2019   Procedure: IRRIGATION AND DEBRIDEMENT OF RETROPHARYNGEAL ABSCESS;  Surgeon: Leta Baptist, MD;  Location: [place];  Service: ENT;  Laterality: N/A;     reports that she has been smoking cigarettes. She has a  25.00 pack-year smoking history. She has never used smokeless tobacco. She reports previous alcohol use. She reports previous drug use.  No Known Allergies  Family History  Problem Relation Age of Onset  . Lung cancer Mother   . Hypertension Mother   . Leukemia Father   . Myelodysplastic syndrome Father     Prior to Admission medications   Medication Sig Start Date End Date Taking? Authorizing Provider  albuterol (PROVENTIL) (2.5 MG/3ML) 0.083% nebulizer solution Take 3 mLs (2.5 mg total) by nebulization every 2 (two) hours as needed for wheezing. Patient not taking: Reported on 06/14/2020 03/29/20   Roxan Hockey, MD  albuterol (VENTOLIN HFA) 108 (90 Base) MCG/ACT inhaler Inhale 2 puffs into the lungs every 6 (six) hours as needed for wheezing or shortness of breath. Patient not taking: Reported on 06/14/2020 03/29/20   Roxan Hockey, MD  apixaban (ELIQUIS) 5 MG TABS tablet Take 1 tablet (5 mg total) by mouth 2 (two) times daily. 03/29/20   Roxan Hockey, MD  cephALEXin (KEFLEX) 500 MG capsule Take 1 capsule (500 mg total) by mouth 2 (two) times daily. 06/27/20   Glennie Isle, NP-C  dexamethasone (DECADRON) 4 MG tablet 1/2 tablet po daily, taper instructions to follow 07/03/20   Hayden Pedro, PA-C  gabapentin (NEURONTIN) 300 MG capsule Take 1 capsule (300 mg total) by mouth 3 (three) times daily. Take 300 mg in the morning, 300 mg at noon and 600 mg at bedtime. 10/19/19   Derek Jack,  MD  Nebulizers (COMPRESSOR/NEBULIZER) MISC 1 Units by Does not apply route as needed. Nebulizer treatments as needed for shortness of breath cough and wheezing Patient not taking: Reported on 06/14/2020 03/29/20   Roxan Hockey, MD  oxyCODONE (OXY IR/ROXICODONE) 5 MG immediate release tablet Take 1 tablet (5 mg total) by mouth every 4 (four) hours as needed for severe pain. Patient not taking: Reported on 06/14/2020 10/12/19   Francene Finders L, NP-C  prochlorperazine (COMPAZINE) 10 MG  tablet Take 1 tablet (10 mg total) by mouth every 6 (six) hours as needed (Nausea or vomiting). Patient not taking: Reported on 06/14/2020 04/06/19   Derek Jack, MD    Physical Exam: Constitutional: Moderately built and nourished. Vitals:   07/04/20 2134 07/04/20 2148 07/04/20 2157  BP: (!) 137/102 (!) 146/103   Pulse:  (!) 103   Resp:  18   Temp:  99.5 F (37.5 C)   TempSrc:  Axillary   SpO2:  96%   Weight:   (!) 136.7 kg  Height:   5\' 6"  (1.676 m)   Eyes: Anicteric no pallor. ENMT: No discharge from the ears eyes nose or mouth. Neck: No mass felt.  No neck rigidity. Respiratory: No rhonchi or crepitations. Cardiovascular: S1-S2 heard. Abdomen: Soft nontender bowel sounds present. Musculoskeletal: No edema. Skin: No rash. Neurologic: Patient is alert awake oriented to name and place has mild weakness of the right upper extremity around 4 x 5 and rest of the extremities are 5 x 5. Psychiatric: Oriented to her name and place.   Labs on Admission: I have personally reviewed following labs and imaging studies  CBC: No results for input(s): WBC, NEUTROABS, HGB, HCT, MCV, PLT in the last 168 hours. Basic Metabolic Panel: No results for input(s): NA, K, CL, CO2, GLUCOSE, BUN, CREATININE, CALCIUM, MG, PHOS in the last 168 hours. GFR: Estimated Creatinine Clearance: 117.3 mL/min (by C-G formula based on SCr of 0.9 mg/dL). Liver Function Tests: No results for input(s): AST, ALT, ALKPHOS, BILITOT, PROT, ALBUMIN in the last 168 hours. No results for input(s): LIPASE, AMYLASE in the last 168 hours. No results for input(s): AMMONIA in the last 168 hours. Coagulation Profile: No results for input(s): INR, PROTIME in the last 168 hours. Cardiac Enzymes: No results for input(s): CKTOTAL, CKMB, CKMBINDEX, TROPONINI in the last 168 hours. BNP (last 3 results) No results for input(s): PROBNP in the last 8760 hours. HbA1C: No results for input(s): HGBA1C in the last 72  hours. CBG: No results for input(s): GLUCAP in the last 168 hours. Lipid Profile: No results for input(s): CHOL, HDL, LDLCALC, TRIG, CHOLHDL, LDLDIRECT in the last 72 hours. Thyroid Function Tests: No results for input(s): TSH, T4TOTAL, FREET4, T3FREE, THYROIDAB in the last 72 hours. Anemia Panel: No results for input(s): VITAMINB12, FOLATE, FERRITIN, TIBC, IRON, RETICCTPCT in the last 72 hours. Urine analysis: No results found for: COLORURINE, APPEARANCEUR, LABSPEC, [place], GLUCOSEU, HGBUR, BILIRUBINUR, KETONESUR, PROTEINUR, UROBILINOGEN, NITRITE, LEUKOCYTESUR Sepsis Labs: @LABRCNTIP (procalcitonin:4,lacticidven:4) ) Recent Results (from the past 240 hour(s))  MRSA PCR Screening     Status: None   Collection Time: 07/04/20  9:59 PM   Specimen: Nasal Mucosa; Nasopharyngeal  Result Value Ref Range Status   MRSA by PCR NEGATIVE NEGATIVE Final    Comment:        The GeneXpert MRSA Assay (FDA approved for NASAL specimens only), is one component of a comprehensive MRSA colonization surveillance program. It is not intended to diagnose MRSA infection nor to guide or monitor treatment for  MRSA infections. Performed at [facility], [place] [address], [place], [place] [address]      Radiological Exams on Admission: No results found.    Assessment/Plan Principal Problem:   Brain metastasis (HCC) Active Problems:   Glucose intolerance   Obesity, Class III, BMI 40-49.9 (morbid obesity) (HCC)   Squamous cell lung cancer, unspecified laterality ([place])    1. Brain mets with cerebral edema with possible hemorrhage and seizures on Decadron and Keppra.  Discussed with on-call neurologist about the Keppra dose.  Patient be continued on Decadron 6 mg IV every 6 and will order MRI of the brain with and without contrast get oncology input. 2. Hyperglycemia could be from steroids.  Check hemoglobin A1c. 3. Metastatic squamous cell lung cancer being followed by oncologist in  any pain. 4. Tobacco abuse advised about quitting. 5. Addendum -history of basilic vein thrombosis and as per the report patient has been off apixaban for more than a week now.  Since patient has underlying brain mets with hemorrhage with possible seizures will need close monitoring for any further worsening in inpatient status.  At this time I do not have any contact number for the patient's family.  Will need to discuss with family when available.  Covid test at [facility] was negative.  Covid test at this visit is pending.  All labs are pending.   DVT prophylaxis: SCDs.  Avoiding anticoagulation due to possible hemorrhage in the brain mass. Code Status: Full code. Family Communication: Unable to reach family. Disposition Plan: To be determined. Consults called: Discussed with on-call neurologist. Admission status: Inpatient.   Rise Patience MD [facility] Pager [phone].  If 7PM-7AM, please contact night-coverage [email or web address] Password [facility]  07/04/2020, 11:49 PM

## 2020-07-05 ENCOUNTER — Inpatient Hospital Stay (HOSPITAL_COMMUNITY): Payer: BC Managed Care – PPO

## 2020-07-05 ENCOUNTER — Inpatient Hospital Stay (HOSPITAL_COMMUNITY)
Admission: AD | Admit: 2020-07-05 | Discharge: 2020-07-05 | Disposition: A | Discharge status: Home / Self Care | Payer: BC Managed Care – PPO | Source: Other Acute Inpatient Hospital | Attending: Internal Medicine | Admitting: Internal Medicine

## 2020-07-05 ENCOUNTER — Other Ambulatory Visit: Payer: Self-pay

## 2020-07-05 DIAGNOSIS — C7931 Secondary malignant neoplasm of brain: Principal | ICD-10-CM

## 2020-07-05 DIAGNOSIS — C3490 Malignant neoplasm of unspecified part of unspecified bronchus or lung: Secondary | ICD-10-CM

## 2020-07-05 DIAGNOSIS — R739 Hyperglycemia, unspecified: Secondary | ICD-10-CM

## 2020-07-05 LAB — HEMOGLOBIN A1C
Hgb A1c MFr Bld: 10.4 % — ABNORMAL HIGH (ref 4.8–5.6)
Mean Plasma Glucose: 251.78 mg/dL

## 2020-07-05 LAB — BASIC METABOLIC PANEL
Anion gap: 9 (ref 5–15)
BUN: 12 mg/dL (ref 6–20)
CO2: 27 mmol/L (ref 22–32)
Calcium: 9.1 mg/dL (ref 8.9–10.3)
Chloride: 99 mmol/L (ref 98–111)
Creatinine, Ser: 0.67 mg/dL (ref 0.44–1.00)
GFR calc Af Amer: >60 mL/min (ref >60)
GFR calc non Af Amer: >60 mL/min (ref >60)
Glucose, Bld: 253 mg/dL — ABNORMAL HIGH (ref 70–99)
Potassium: 4.3 mmol/L (ref 3.5–5.1)
Sodium: 135 mmol/L (ref 135–145)

## 2020-07-05 LAB — COMPREHENSIVE METABOLIC PANEL
ALT: 61 U/L — ABNORMAL HIGH (ref 0–44)
AST: 27 U/L (ref 15–41)
Albumin: 3.6 g/dL (ref 3.5–5.0)
Alkaline Phosphatase: 93 U/L (ref 38–126)
Anion gap: 11 (ref 5–15)
BUN: 10 mg/dL (ref 6–20)
CO2: 27 mmol/L (ref 22–32)
Calcium: 9.3 mg/dL (ref 8.9–10.3)
Chloride: 98 mmol/L (ref 98–111)
Creatinine, Ser: 0.68 mg/dL (ref 0.44–1.00)
GFR calc Af Amer: >60 mL/min (ref >60)
GFR calc non Af Amer: >60 mL/min (ref >60)
Glucose, Bld: 250 mg/dL — ABNORMAL HIGH (ref 70–99)
Potassium: 4.5 mmol/L (ref 3.5–5.1)
Sodium: 136 mmol/L (ref 135–145)
Total Bilirubin: 1 mg/dL (ref 0.3–1.2)
Total Protein: 6.8 g/dL (ref 6.5–8.1)

## 2020-07-05 LAB — URINALYSIS, ROUTINE W REFLEX MICROSCOPIC
Bilirubin Urine: NEGATIVE
Glucose, UA: >=500 mg/dL — AB
Ketones, ur: 5 mg/dL — AB
Leukocytes,Ua: NEGATIVE
Nitrite: NEGATIVE
Protein, ur: NEGATIVE mg/dL
Specific Gravity, Urine: 1.011 (ref 1.005–1.030)
pH: 6 (ref 5.0–8.0)

## 2020-07-05 LAB — CBC WITH DIFFERENTIAL/PLATELET
Abs Immature Granulocytes: 0.22 10*3/uL — ABNORMAL HIGH (ref 0.00–0.07)
Basophils Absolute: 0 10*3/uL (ref 0.0–0.1)
Basophils Relative: 0 %
Eosinophils Absolute: 0 10*3/uL (ref 0.0–0.5)
Eosinophils Relative: 0 %
HCT: 44.1 % (ref 36.0–46.0)
Hemoglobin: 14.5 g/dL (ref 12.0–15.0)
Immature Granulocytes: 2 %
Lymphocytes Relative: 7 %
Lymphs Abs: 0.8 10*3/uL (ref 0.7–4.0)
MCH: 32.2 pg (ref 26.0–34.0)
MCHC: 32.9 g/dL (ref 30.0–36.0)
MCV: 98 fL (ref 80.0–100.0)
Monocytes Absolute: 0.5 10*3/uL (ref 0.1–1.0)
Monocytes Relative: 4 %
Neutro Abs: 10.5 10*3/uL — ABNORMAL HIGH (ref 1.7–7.7)
Neutrophils Relative %: 87 %
Platelets: 258 10*3/uL (ref 150–400)
RBC: 4.5 MIL/uL (ref 3.87–5.11)
RDW: 17.1 % — ABNORMAL HIGH (ref 11.5–15.5)
WBC: 12 10*3/uL — ABNORMAL HIGH (ref 4.0–10.5)
nRBC: 0 % (ref 0.0–0.2)

## 2020-07-05 LAB — CBC
HCT: 43 % (ref 36.0–46.0)
Hemoglobin: 14.2 g/dL (ref 12.0–15.0)
MCH: 32.3 pg (ref 26.0–34.0)
MCHC: 33 g/dL (ref 30.0–36.0)
MCV: 97.7 fL (ref 80.0–100.0)
Platelets: 258 10*3/uL (ref 150–400)
RBC: 4.4 MIL/uL (ref 3.87–5.11)
RDW: 17.2 % — ABNORMAL HIGH (ref 11.5–15.5)
WBC: 12.1 10*3/uL — ABNORMAL HIGH (ref 4.0–10.5)
nRBC: 0 % (ref 0.0–0.2)

## 2020-07-05 LAB — GLUCOSE, CAPILLARY
Glucose-Capillary: 259 mg/dL — ABNORMAL HIGH (ref 70–99)
Glucose-Capillary: 229 mg/dL — ABNORMAL HIGH (ref 70–99)
Glucose-Capillary: 304 mg/dL — ABNORMAL HIGH (ref 70–99)
Glucose-Capillary: 273 mg/dL — ABNORMAL HIGH (ref 70–99)

## 2020-07-05 LAB — SARS CORONAVIRUS 2 BY RT PCR (HOSPITAL ORDER, PERFORMED IN ~~LOC~~ HOSPITAL LAB): SARS Coronavirus 2: NEGATIVE

## 2020-07-05 LAB — PREGNANCY, URINE: Preg Test, Ur: NEGATIVE

## 2020-07-05 LAB — LACTIC ACID, PLASMA
Lactic Acid, Venous: 1.1 mmol/L (ref 0.5–1.9)
Lactic Acid, Venous: 1.2 mmol/L (ref 0.5–1.9)

## 2020-07-05 MED ORDER — LEVETIRACETAM IN NACL 1500 MG/100ML IV SOLN
1500.0000 mg | Freq: Two times a day (BID) | INTRAVENOUS | Status: DC
  Administered 2020-07-05 – 2020-07-07 (×5): 1500 mg via INTRAVENOUS
  Filled 2020-07-05 (×7): qty 100

## 2020-07-05 MED ORDER — INSULIN ASPART 100 UNIT/ML ~~LOC~~ SOLN
0.0000 [IU] | Freq: Three times a day (TID) | SUBCUTANEOUS | Status: DC
  Administered 2020-07-05: 7 [IU] via SUBCUTANEOUS
  Administered 2020-07-05 (×2): 5 [IU] via SUBCUTANEOUS
  Administered 2020-07-06 – 2020-07-07 (×4): 7 [IU] via SUBCUTANEOUS
  Administered 2020-07-07 (×2): 9 [IU] via SUBCUTANEOUS
  Administered 2020-07-08: 7 [IU] via SUBCUTANEOUS
  Administered 2020-07-08: 5 [IU] via SUBCUTANEOUS
  Administered 2020-07-08: 7 [IU] via SUBCUTANEOUS
  Administered 2020-07-09: 3 [IU] via SUBCUTANEOUS
  Administered 2020-07-09: 5 [IU] via SUBCUTANEOUS
  Administered 2020-07-09: 9 [IU] via SUBCUTANEOUS
  Administered 2020-07-10 (×2): 5 [IU] via SUBCUTANEOUS
  Administered 2020-07-10: 3 [IU] via SUBCUTANEOUS
  Administered 2020-07-11: 7 [IU] via SUBCUTANEOUS
  Administered 2020-07-11: 5 [IU] via SUBCUTANEOUS
  Administered 2020-07-11: 9 [IU] via SUBCUTANEOUS
  Administered 2020-07-12: 3 [IU] via SUBCUTANEOUS
  Administered 2020-07-12: 5 [IU] via SUBCUTANEOUS

## 2020-07-05 MED ORDER — ORAL CARE MOUTH RINSE
15.0000 mL | Freq: Two times a day (BID) | OROMUCOSAL | Status: DC
  Administered 2020-07-05 – 2020-07-12 (×13): 15 mL via OROMUCOSAL

## 2020-07-05 MED ORDER — CHLORHEXIDINE GLUCONATE 0.12 % MT SOLN
15.0000 mL | Freq: Two times a day (BID) | OROMUCOSAL | Status: DC
  Administered 2020-07-05 – 2020-07-12 (×14): 15 mL via OROMUCOSAL
  Filled 2020-07-05 (×14): qty 15

## 2020-07-05 MED ORDER — INSULIN GLARGINE 100 UNIT/ML ~~LOC~~ SOLN
20.0000 [IU] | Freq: Every day | SUBCUTANEOUS | Status: DC
  Administered 2020-07-05: 20 [IU] via SUBCUTANEOUS
  Filled 2020-07-05 (×2): qty 0.2

## 2020-07-05 MED ORDER — GADOBUTROL 1 MMOL/ML IV SOLN
10.0000 mL | Freq: Once | INTRAVENOUS | Status: AC | PRN
  Administered 2020-07-05: 10 mL via INTRAVENOUS

## 2020-07-05 NOTE — Progress Notes (Signed)
Inpatient Diabetes Program Recommendations  AACE/ADA: New Consensus Statement on Inpatient Glycemic Control (2015)  Target Ranges:  Prepandial:   less than 140 mg/dL      Peak postprandial:   less than 180 mg/dL (1-2 hours)      Critically ill patients:  140 - 180 mg/dL   Lab Results  Component Value Date   GLUCAP 229 (H) 07/05/2020   HGBA1C 10.4 (H) 07/05/2020    Review of Glycemic Control  Diabetes history: "Glucose Intolerance" Outpatient Diabetes medications: None Current orders for Inpatient glycemic control: Lantus 20 units QD, Novolog 0-9 units tidwc  HgbA1C - 10.4% CBGs 259, 229 mg/dL Decadron 6 mg Q6H  Steroid-induced hyperglycemia New onset DM with HgbA1C of 10.4%  Inpatient Diabetes Program Recommendations:     Increase Novolog to 0-15 units tidwc and hs May need meal coverage if po intake increases If FBS > 180 mg/dL, increase Lantus to 25 units QD.  Avoid hypoglycemia.  Will follow glucose trends.   Thank you. Lorenda Peck, RD, LDN, CDE Inpatient Diabetes Coordinator [phone]

## 2020-07-05 NOTE — Progress Notes (Signed)
MD Starla Link states okay for pt to come off tele when going down for MRI. Erin in MRI notified and aware.

## 2020-07-05 NOTE — Progress Notes (Signed)
Spoke to Proctor, pt's sister and provided updates. Sonia Baller states she would like to be involved in pt's care and possible hospice discussion with Education officer, museum. SW Vanoss informed and given Jenny's contact information.

## 2020-07-05 NOTE — Progress Notes (Addendum)
Patient ID: Tara Aguilar, female   DOB: July 24, 1981, 39 y.o.   MRN: [id number]  PROGRESS NOTE    Tara Aguilar  ZYS:[id number] DOB: 10/08/1981 DOA: 07/04/2020 PCP: Tara Chroman, MD   Brief Narrative:  39 y.o. 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 Patient had a CT of the head which showed 2.4 cm mass in the left frontoparietal area with possible hemorrhage and the edema of the left cerebral area with effacement of the left lateral ventricle.  Patient was started on Keppra and Decadron and discussed with patient's oncologist/Dr. Delton Coombes who advised that patient be transferred to [facility] for radiation oncology evaluation.  Assessment & Plan:   Seizure and confusion in a patient with history of known metastasis to brain -Patient has a known history of lung cancer with metastasis to brain and recently completed brain radiation on May 24, 2020 and has been experiencing right upper and lower extremity numbness for the past few weeks and was also placed on Decadron by patient's oncologist.  Presented with seizure like episode of confusion with right-sided weakness. -CT of the head which showed 2.4 cm mass in the left frontoparietal area with possible hemorrhage and the edema of the left cerebral area with effacement of the left lateral ventricle. -Currently on Keppra and IV Decadron.  Continue.  Follow MRI of the brain. -Follow-up input from radiation oncology.  Secure chatted with neuro oncology/Dr. Mickeal Skinner who recommends high dose of IV Decadron, 24 mg a day while hospitalized and switch to around 4 mg daily once patient is stable and not  postictal.  Spoke to neurology/Dr. Rory Percy on phone on 07/05/2020 who recommended EEG. -Seizure precautions/aspiration precautions.   -Outpatient follow-up with oncology -PT eval once patient is more awake  Metastatic squamous cell cancer -Being followed up by Dr. Katragadda/oncology at [place] care evaluation for goals of care discussion  Hyperglycemia -Most likely right secondary to steroid use.  Check hemoglobin A1c.  Continue Lantus along with CBGs with SSI  Leukocytosis -Probably reactive.  Monitor  Morbid obesity -Outpatient follow-up  Tobacco abuse -Needs to stop smoking  History of left basilic vein thrombosis -Eliquis on hold for now.  Will follow MRI of the brain     DVT prophylaxis: SCDs.  Avoiding anticoagulation due to possible hemorrhage in the brain mass Code Status: Full Family Communication: None at bedside Disposition Plan: Status is: Inpatient  Remains inpatient appropriate because:Inpatient level of care appropriate due to severity of illness   Dispo: The patient is from: Home              Anticipated d/c is to: Home              Anticipated d/c date is: 3 days              Patient currently is not medically stable to d/c.   Consultants: Radiation oncology.  Discussed with Dr. Mickeal Skinner via secure chat.  Spoke to on-call neurologist/Dr. Rory Percy on phone on 07/05/2020 Procedures: None  Antimicrobials: None   Subjective: Patient seen and examined at bedside.  She is sleepy, wakes up slightly, denies any headache or nausea.  Poor  historian.  Follows commands slightly.  No overnight fever or vomiting reported.  Objective: Vitals:   07/05/20 0600 07/05/20 0645 07/05/20 0800 07/05/20 0832  BP: 120/64   (!) 113/52  Pulse: 100 81  95  Resp: 16 11  15   Temp:   98.4 F (36.9 C)   TempSrc:   Axillary   SpO2: 95% 94%  99%  Weight:      Height:        Intake/Output Summary (Last 24 hours) at 07/05/2020 1100 Last data filed at  07/05/2020 0015 Gross per 24 hour  Intake --  Output 600 ml  Net -600 ml   Filed Weights   07/04/20 2157  Weight: (!) 136.7 kg    Examination:  General exam: Appears calm and comfortable.  Looks chronically ill. Respiratory system: Bilateral decreased breath sounds at bases with some scattered crackles Cardiovascular system: S1 & S2 heard, Rate controlled Gastrointestinal system: Abdomen is nondistended, soft and nontender. Normal bowel sounds heard. Extremities: No cyanosis, clubbing; trace lower extremity edema present Central nervous system: Sleepy, wakes up slightly, follows very minimal commands.  Go back to sleep.  No focal neurological deficits. Moving extremities Skin: No rashes, lesions or ulcers Psychiatry: Could not be assessed because of mental status  Data Reviewed: I have personally reviewed following labs and imaging studies  CBC: Recent Labs  Lab 07/05/20 0000 07/05/20 0249  WBC 12.0* 12.1*  NEUTROABS 10.5*  --   HGB 14.5 14.2  HCT 44.1 43.0  MCV 98.0 97.7  PLT 258 580   Basic Metabolic Panel: Recent Labs  Lab 07/05/20 0000 07/05/20 0249  NA 136 135  K 4.5 4.3  CL 98 99  CO2 27 27  GLUCOSE 250* 253*  BUN 10 12  CREATININE 0.68 0.67  CALCIUM 9.3 9.1   GFR: Estimated Creatinine Clearance: 131.9 mL/min (by C-G formula based on SCr of 0.67 mg/dL). Liver Function Tests: Recent Labs  Lab 07/05/20 0000  AST 27  ALT 61*  ALKPHOS 93  BILITOT 1.0  PROT 6.8  ALBUMIN 3.6   No results for input(s): LIPASE, AMYLASE in the last 168 hours. No results for input(s): AMMONIA in the last 168 hours. Coagulation Profile: No results for input(s): INR, PROTIME in the last 168 hours. Cardiac Enzymes: No results for input(s): CKTOTAL, CKMB, CKMBINDEX, TROPONINI in the last 168 hours. BNP (last 3 results) No results for input(s): PROBNP in the last 8760 hours. HbA1C: Recent Labs    07/05/20 0000  HGBA1C 10.4*   CBG: Recent Labs  Lab 07/05/20 0751   GLUCAP 259*   Lipid Profile: No results for input(s): CHOL, HDL, LDLCALC, TRIG, CHOLHDL, LDLDIRECT in the last 72 hours. Thyroid Function Tests: No results for input(s): TSH, T4TOTAL, FREET4, T3FREE, THYROIDAB in the last 72 hours. Anemia Panel: No results for input(s): VITAMINB12, FOLATE, FERRITIN, TIBC, IRON, RETICCTPCT in the last 72 hours. Sepsis Labs: Recent Labs  Lab 07/05/20 0000 07/05/20 0249  LATICACIDVEN 1.2 1.1    Recent Results (from the past 240 hour(s))  MRSA PCR Screening     Status: None   Collection Time: 07/04/20  9:59 PM   Specimen: Nasal Mucosa; Nasopharyngeal  Result Value Ref Range Status   MRSA by PCR NEGATIVE NEGATIVE Final    Comment:        The GeneXpert MRSA Assay (FDA approved for NASAL specimens only), is one component of a comprehensive MRSA colonization surveillance program. It is not intended to diagnose MRSA infection nor to guide  or monitor treatment for MRSA infections. Performed at [facility], [place] [address], [place], [place] [address]   SARS Coronavirus 2 by RT PCR (hospital order, performed in [facility] hospital lab) Nasopharyngeal Nasopharyngeal Swab     Status: None   Collection Time: 07/05/20 12:17 AM   Specimen: Nasopharyngeal Swab  Result Value Ref Range Status   SARS Coronavirus 2 NEGATIVE NEGATIVE Final    Comment: (NOTE) SARS-CoV-2 target nucleic acids are NOT DETECTED.  The SARS-CoV-2 RNA is generally detectable in upper and lower respiratory specimens during the acute phase of infection. The lowest concentration of SARS-CoV-2 viral copies this assay can detect is 250 copies / mL. A negative result does not preclude SARS-CoV-2 infection and should not be used as the sole basis for treatment or other patient management decisions.  A negative result may occur with improper specimen collection / handling, submission of specimen other than nasopharyngeal swab, presence of viral mutation(s) within  the areas targeted by this assay, and inadequate number of viral copies (<250 copies / mL). A negative result must be combined with clinical observations, patient history, and epidemiological information.  Fact Sheet for Patients:   StrictlyIdeas.no  Fact Sheet for Healthcare Providers: BankingDealers.co.za  This test is not yet approved or  cleared by the [place] FDA and has been authorized for detection and/or diagnosis of SARS-CoV-2 by FDA under an Emergency Use Authorization (EUA).  This EUA will remain in effect (meaning this test can be used) for the duration of the COVID-19 declaration under Section 564(b)(1) of the Act, 21 U.S.C. section 360bbb-3(b)(1), unless the authorization is terminated or revoked sooner.  Performed at [facility], [place] [address], [place], [place] [address]          Radiology Studies: No results found.      Scheduled Meds: . chlorhexidine  15 mL Mouth Rinse BID  . Chlorhexidine Gluconate Cloth  6 each Topical Daily  . dexamethasone (DECADRON) injection  6 mg Intravenous Q6H  . insulin aspart  0-9 Units Subcutaneous TID WC  . insulin glargine  20 Units Subcutaneous Daily  . mouth rinse  15 mL Mouth Rinse q12n4p   Continuous Infusions: . levETIRAcetam Stopped (07/05/20 1033)          Aline August, MD [facility] 07/05/2020, 11:00 AM

## 2020-07-05 NOTE — TOC Initial Note (Addendum)
Transition of Care [facility]) - Initial/Assessment Note    Patient Details  Name: Tara Aguilar MRN: [id number] Date of Birth: 1981/01/02  Transition of [facility]) CM/SW Contact:    Leeroy Cha, RN Phone Number: 07/05/2020, 8:14 AM  Clinical Narrative:                 Hx-lung ca with mets to brain. 2.4cm mass in the left frontopariteal area, rt facial drooping and slurred speech. Iv decadron, iv keppra, [place] at 2l/min. Plan possible home with hospice versus residental hospice.  Pt is widowed and lives alone, does have family in area. And a friend is listed as contact. [phone] TCF Sister Arvella Merles at [phone]/ updated on condition and possible plan.  Caller was very aware of entries into the chart. She [place] out of state but does want to be kept abreast of patient's progress . tct-Luwanna cochran/updated on progress. Coming to the hospital today to see patient. Home hospice would not be good for her due to 41 year old that lives with the patient.  Lujwanna does not live with er and residental hospice or even snf. Expected Discharge Plan: Home w Hospice Care Barriers to Discharge: Continued Medical Work up   Patient Goals and CMS Choice Patient states their goals for this hospitalization and ongoing recovery are:: to go back home CMS Medicare.gov Compare Post Acute Care list provided to:: Patient    Expected Discharge Plan and Services Expected Discharge Plan: [place]   Discharge Planning Services: CM Consult   Living arrangements for the past 2 months: Single Family Home                                      Prior Living Arrangements/Services Living arrangements for the past 2 months: Single Family Home Lives with:: Self Patient language and need for interpreter reviewed:: Yes Do you feel safe going back to the place where you live?: Yes      Need for Family Participation in Patient Care: Yes (Comment) Care giver support system in place?: Yes  (comment)   Criminal Activity/Legal Involvement Pertinent to Current Situation/Hospitalization: No - Comment as needed  Activities of Daily Living Home Assistive Devices/Equipment: Cane (specify quad or straight) ADL Screening (condition at time of admission) Patient's cognitive ability adequate to safely complete daily activities?: Yes Is the patient deaf or have difficulty hearing?: No Does the patient have difficulty seeing, even when wearing glasses/contacts?: No Does the patient have difficulty concentrating, remembering, or making decisions?: No Patient able to express need for assistance with ADLs?: Yes Does the patient have difficulty dressing or bathing?: Yes Independently performs ADLs?: No Communication: Independent Dressing (OT): Needs assistance Is this a change from baseline?: Pre-admission baseline Grooming: Needs assistance Is this a change from baseline?: Pre-admission baseline Feeding: Needs assistance Is this a change from baseline?: Pre-admission baseline Bathing: Needs assistance Is this a change from baseline?: Pre-admission baseline Toileting: Needs assistance Is this a change from baseline?: Pre-admission baseline In/Out Bed: Needs assistance Is this a change from baseline?: Pre-admission baseline Walks in Home: Needs assistance Is this a change from baseline?: Pre-admission baseline Does the patient have difficulty walking or climbing stairs?: Yes Weakness of Legs: Both Weakness of Arms/Hands: Both  Permission Sought/Granted                  Emotional Assessment Appearance:: Appears stated age Attitude/Demeanor/Rapport: Engaged  Affect (typically observed): Calm Orientation: : Oriented to Place, Oriented to Self, Oriented to  Time, Oriented to Situation Alcohol / Substance Use: Not Applicable Psych Involvement: No (comment)  Admission diagnosis:  Brain metastasis ([place]) [C79.31] Patient Active Problem List   Diagnosis Date Noted  . Brain  metastasis ([place]) 07/04/2020  . Palliative care by specialist   . Lung cancer ([place]) 03/28/2020  . 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 Squamous cell lung cancer, unspecified laterality ([place]) 04/05/2019  . Goals of care, counseling/discussion 04/05/2019  . 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 Glucose intolerance 03/11/2019  . Obesity, Class III, BMI 40-49.9 (morbid obesity) ([place]) 03/11/2019  . Tobacco use 03/11/2019  . Dental abscess 03/07/2019  . 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 Depression    PCP:  Glenda Chroman, MD Pharmacy:   [place], [place] - [address] [place] [address] [place] [address] Phone: [phone] Fax: [phone]  [place], [place] [address] Phone: [phone] Fax: [place], [address] [place] [address] Phone: [phone] Fax: [phone]     Social Determinants of Health (SDOH) Interventions    Readmission Risk Interventions No flowsheet data found.

## 2020-07-05 NOTE — Progress Notes (Signed)
EEG complete - results pending 

## 2020-07-05 NOTE — Progress Notes (Signed)
When patient arrived to [facility] last night, I asked her who she lived with. She stated that she lived with her son. I asked her his name and phone number. She told me his name and stated that she could not remember his phone number at this time. I stated I wanted to let him know that you were here at [facility].  She stated that he would find out she was here. I will pass on to the day shift RN to ask again what his phone number is to put in Epic.

## 2020-07-06 DIAGNOSIS — Z515 Encounter for palliative care: Secondary | ICD-10-CM

## 2020-07-06 DIAGNOSIS — Z7189 Other specified counseling: Secondary | ICD-10-CM

## 2020-07-06 DIAGNOSIS — E1165 Type 2 diabetes mellitus with hyperglycemia: Secondary | ICD-10-CM

## 2020-07-06 DIAGNOSIS — R569 Unspecified convulsions: Secondary | ICD-10-CM

## 2020-07-06 LAB — GLUCOSE, CAPILLARY
Glucose-Capillary: 212 mg/dL — ABNORMAL HIGH (ref 70–99)
Glucose-Capillary: 315 mg/dL — ABNORMAL HIGH (ref 70–99)
Glucose-Capillary: 315 mg/dL — ABNORMAL HIGH (ref 70–99)
Glucose-Capillary: 309 mg/dL — ABNORMAL HIGH (ref 70–99)
Glucose-Capillary: 368 mg/dL — ABNORMAL HIGH (ref 70–99)

## 2020-07-06 LAB — CBC WITH DIFFERENTIAL/PLATELET
Abs Immature Granulocytes: 0.1 10*3/uL — ABNORMAL HIGH (ref 0.00–0.07)
Basophils Absolute: 0 10*3/uL (ref 0.0–0.1)
Basophils Relative: 0 %
Eosinophils Absolute: 0 10*3/uL (ref 0.0–0.5)
Eosinophils Relative: 0 %
HCT: 44.2 % (ref 36.0–46.0)
Hemoglobin: 14.4 g/dL (ref 12.0–15.0)
Immature Granulocytes: 1 %
Lymphocytes Relative: 6 %
Lymphs Abs: 0.7 10*3/uL (ref 0.7–4.0)
MCH: 31.8 pg (ref 26.0–34.0)
MCHC: 32.6 g/dL (ref 30.0–36.0)
MCV: 97.6 fL (ref 80.0–100.0)
Monocytes Absolute: 0.7 10*3/uL (ref 0.1–1.0)
Monocytes Relative: 6 %
Neutro Abs: 10.2 10*3/uL — ABNORMAL HIGH (ref 1.7–7.7)
Neutrophils Relative %: 87 %
Platelets: 246 10*3/uL (ref 150–400)
RBC: 4.53 MIL/uL (ref 3.87–5.11)
RDW: 16.8 % — ABNORMAL HIGH (ref 11.5–15.5)
WBC: 11.7 10*3/uL — ABNORMAL HIGH (ref 4.0–10.5)
nRBC: 0 % (ref 0.0–0.2)

## 2020-07-06 LAB — COMPREHENSIVE METABOLIC PANEL
ALT: 52 U/L — ABNORMAL HIGH (ref 0–44)
AST: 18 U/L (ref 15–41)
Albumin: 3.6 g/dL (ref 3.5–5.0)
Alkaline Phosphatase: 96 U/L (ref 38–126)
Anion gap: 9 (ref 5–15)
BUN: 22 mg/dL — ABNORMAL HIGH (ref 6–20)
CO2: 29 mmol/L (ref 22–32)
Calcium: 9.4 mg/dL (ref 8.9–10.3)
Chloride: 99 mmol/L (ref 98–111)
Creatinine, Ser: 0.77 mg/dL (ref 0.44–1.00)
GFR calc Af Amer: >60 mL/min (ref >60)
GFR calc non Af Amer: >60 mL/min (ref >60)
Glucose, Bld: 314 mg/dL — ABNORMAL HIGH (ref 70–99)
Potassium: 4.9 mmol/L (ref 3.5–5.1)
Sodium: 137 mmol/L (ref 135–145)
Total Bilirubin: 0.4 mg/dL (ref 0.3–1.2)
Total Protein: 7.1 g/dL (ref 6.5–8.1)

## 2020-07-06 LAB — MAGNESIUM: Magnesium: 2.2 mg/dL (ref 1.7–2.4)

## 2020-07-06 MED ORDER — LIVING WELL WITH DIABETES BOOK
Freq: Once | Status: AC
  Administered 2020-07-06: 1
  Filled 2020-07-06: qty 1

## 2020-07-06 MED ORDER — INSULIN GLARGINE 100 UNIT/ML ~~LOC~~ SOLN
25.0000 [IU] | Freq: Every day | SUBCUTANEOUS | Status: DC
  Administered 2020-07-06 – 2020-07-07 (×2): 25 [IU] via SUBCUTANEOUS
  Filled 2020-07-06 (×2): qty 0.25

## 2020-07-06 MED ORDER — INSULIN ASPART 100 UNIT/ML ~~LOC~~ SOLN
7.0000 [IU] | Freq: Once | SUBCUTANEOUS | Status: AC
  Administered 2020-07-06: 7 [IU] via SUBCUTANEOUS

## 2020-07-06 NOTE — Procedures (Signed)
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  EEG to evaluate for seizures.  Level of alertness: Awake  AEDs during EEG study: Keppra  Technical aspects: This EEG study was done with scalp electrodes positioned according to the 10-20 International system of electrode placement. Electrical activity was acquired at a sampling rate of 500Hz  and reviewed with a high frequency filter of 70Hz  and a low frequency filter of 1Hz . EEG data were recorded continuously and digitally stored.   Description: No clear posterior dominant rhythm was seen.  EEG showed condyle generalized more for 3 to 6 Hz theta-delta slowing.  Intermittent rhythmic 2 to 3 Hz delta slowing was also seen in left hemisphere.  Hyperventilation and photic stimulation were not performed.     ABNORMALITY -Continuous slow, generalized - Intermittent rhythmic delta slow, left hemisphere  IMPRESSION: This study is suggestive of cortical dysfunction in left hemisphere likely secondary to underlying structural abnormality, cerebral mets, postictal state.  No seizures or epileptiform discharges were seen throughout the recording.  [name] [name]

## 2020-07-06 NOTE — Progress Notes (Signed)
Patient ID: Tara Aguilar, female   DOB: July 18, 1981, 39 y.o.   MRN: [id number]  PROGRESS NOTE    Tara Aguilar  CBJ:[id number] DOB: Oct 28, 1980 DOA: 07/04/2020 PCP: Glenda Chroman, MD   Brief Narrative:  39 y.o. 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 Patient had a CT of the head which showed 2.4 cm mass in the left frontoparietal area with possible hemorrhage and the edema of the left cerebral area with effacement of the left lateral ventricle.  Patient was started on Keppra and Decadron and discussed with patient's oncologist/Dr. Delton Coombes who advised that patient be transferred to [facility] for radiation oncology evaluation.  Assessment & Plan:   Seizure and confusion in a patient with history of known metastasis to brain -Patient has a known history of lung cancer with metastasis to brain and recently completed brain radiation on May 24, 2020 and has been experiencing right upper and lower extremity numbness for the past few weeks and was also placed on Decadron by patient's oncologist.  Presented with seizure like episode of confusion with right-sided weakness. -CT of the head showed 2.4 cm mass in the left frontoparietal area with possible hemorrhage and the edema of the left cerebral area with effacement of the left lateral ventricle. -Currently on Keppra and IV Decadron. MRI of the brain showed minimal increase in the size of the larger 2 enhancing lesions, favored to reflect treatment change in the setting of interval radiation treatment; decreased size of the smallest lesion in the high left frontoparietal region; no new enhancing lesions  identified. When accounting for differences in technique, similar associated surrounding edema and mass effect with trace rightward midline shift. No hydrocephalus. -No need for radiation at this time as per radiation oncology.  Secure chatted with neuro oncology/Dr. Mickeal Skinner on 07/05/20 who recommended high doses of IV Decadron, upto total of 24 mg a day while hospitalized and switch to around 4 mg daily once patient is stable and not postictal.  Spoke to neurology/Dr. Rory Percy on phone on 07/05/2020 who recommended EEG.  -Spoke to Dr. Arora/neurology again on 07/06/2020 on phone who stated that EEG did not show any obvious seizure activities although official report is still pending.  We will hold off on official neurology consultation as patient is more awake this morning. -Seizure precautions/aspiration precautions.   -Outpatient follow-up with oncology -PT eval  -Patient is still slow to respond to questions but more awake and answers questions appropriately.  Will advance diet to carb modified diet and if tolerates diet, will switch Keppra to oral.  Metastatic squamous cell cancer -Being followed up by Dr. Katragadda/oncology at [place] care evaluation for goals of care discussion  New diagnosis of diabetes mellitus type 2 uncontrolled with hyperglycemia -hemoglobin A1c 10.4. Increase Lantus to 25 units daily along with CBGs with SSI  Leukocytosis -Probably reactive.  Monitor  Morbid obesity -Outpatient follow-up  Tobacco abuse -Needs to stop smoking  History of left basilic vein thrombosis -Eliquis on hold for now. Will resume Eliquis if okay with neuro oncology  Generalized conditioning -Overall prognosis is very poor. Palliative care evaluation is pending     DVT prophylaxis:  SCDs.  Avoiding anticoagulation due to possible hemorrhage in the brain mass Code Status: Full Family Communication: None at bedside Disposition Plan: Status is: Inpatient  Remains  inpatient appropriate because:Inpatient level of care appropriate due to severity of illness   Dispo: The patient is from: Home              Anticipated d/c is to: Home              Anticipated d/c date is: 2 days              Patient currently is not medically stable to d/c.   Consultants: Radiation oncology.  Discussed with Dr. Mickeal Skinner via secure chat.  Spoke to on-call neurologist/Dr. Rory Percy on phone on 07/05/2020 Palliative care  Procedures: EEG report is pending  Antimicrobials: None   Subjective: Patient seen and examined at bedside.  Poor historian.  Sleepy, wakes up slightly, still slow to respond to questions but answers them appropriately.  Denies current headache.  No overnight seizures, fever or vomiting reported.  Objective: Vitals:   07/06/20 0341 07/06/20 0400 07/06/20 0500 07/06/20 0600  BP:  110/71 110/78 110/64  Pulse:  81 82 86  Resp:  14 15 14   Temp: 97.8 F (36.6 C)     TempSrc: Axillary     SpO2:  92% 95% 97%  Weight:      Height:        Intake/Output Summary (Last 24 hours) at 07/06/2020 0715 Last data filed at 07/06/2020 0600 Gross per 24 hour  Intake 1040 ml  Output 2500 ml  Net -1460 ml   Filed Weights   07/04/20 2157  Weight: (!) 136.7 kg    Examination:  General exam: No distress.  Looks chronically ill. Respiratory system: Bilateral decreased breath sounds at bases with no wheezing. Some crackles heard. Cardiovascular system: Rate controlled, S1-S2 heard Gastrointestinal system: Abdomen is morbidly obese, nondistended, soft and nontender. Bowel sounds are heard Extremities: Bilateral lower extremity edema present. No clubbing Central nervous system:  Sleepy, wakes up slightly, still slow to respond to questions but answers them appropriately and is oriented.  No focal neurological deficits. Moving extremities Skin: No obvious ecchymosis/rashes Psychiatry: Does not engage in conversation much.  Flat affect.  Data Reviewed: I have  personally reviewed following labs and imaging studies  CBC: Recent Labs  Lab 07/05/20 0000 07/05/20 0249 07/06/20 0255  WBC 12.0* 12.1* 11.7*  NEUTROABS 10.5*  --  10.2*  HGB 14.5 14.2 14.4  HCT 44.1 43.0 44.2  MCV 98.0 97.7 97.6  PLT 258 258 944   Basic Metabolic Panel: Recent Labs  Lab 07/05/20 0000 07/05/20 0249 07/06/20 0255  NA 136 135 137  K 4.5 4.3 4.9  CL 98 99 99  CO2 27 27 29   GLUCOSE 250* 253* 314*  BUN 10 12 22*  CREATININE 0.68 0.67 0.77  CALCIUM 9.3 9.1 9.4  MG  --   --  2.2   GFR: Estimated Creatinine Clearance: 131.9 mL/min (by C-G formula based on SCr of 0.77 mg/dL). Liver Function Tests: Recent Labs  Lab 07/05/20 0000 07/06/20 0255  AST 27 18  ALT 61* 52*  ALKPHOS 93 96  BILITOT 1.0 0.4  PROT 6.8 7.1  ALBUMIN 3.6 3.6   No results for input(s): LIPASE, AMYLASE in the last 168 hours. No results for input(s): AMMONIA in the last 168 hours. Coagulation Profile: No results for input(s): INR, PROTIME in the last 168 hours. Cardiac Enzymes: No results for  input(s): CKTOTAL, CKMB, CKMBINDEX, TROPONINI in the last 168 hours. BNP (last 3 results) No results for input(s): PROBNP in the last 8760 hours. HbA1C: Recent Labs    07/05/20 0000  HGBA1C 10.4*   CBG: Recent Labs  Lab 07/05/20 0751 07/05/20 1149 07/05/20 1641 07/05/20 2134  GLUCAP 259* 229* 304* 273*   Lipid Profile: No results for input(s): CHOL, HDL, LDLCALC, TRIG, CHOLHDL, LDLDIRECT in the last 72 hours. Thyroid Function Tests: No results for input(s): TSH, T4TOTAL, FREET4, T3FREE, THYROIDAB in the last 72 hours. Anemia Panel: No results for input(s): VITAMINB12, FOLATE, FERRITIN, TIBC, IRON, RETICCTPCT in the last 72 hours. Sepsis Labs: Recent Labs  Lab 07/05/20 0000 07/05/20 0249  LATICACIDVEN 1.2 1.1    Recent Results (from the past 240 hour(s))  MRSA PCR Screening     Status: None   Collection Time: 07/04/20  9:59 PM   Specimen: Nasal Mucosa; Nasopharyngeal    Result Value Ref Range Status   MRSA by PCR NEGATIVE NEGATIVE Final    Comment:        The GeneXpert MRSA Assay (FDA approved for NASAL specimens only), is one component of a comprehensive MRSA colonization surveillance program. It is not intended to diagnose MRSA infection nor to guide or monitor treatment for MRSA infections. Performed at [facility], [place] [address], [place], [place] [address]   SARS Coronavirus 2 by RT PCR (hospital order, performed in [facility] hospital lab) Nasopharyngeal Nasopharyngeal Swab     Status: None   Collection Time: 07/05/20 12:17 AM   Specimen: Nasopharyngeal Swab  Result Value Ref Range Status   SARS Coronavirus 2 NEGATIVE NEGATIVE Final    Comment: (NOTE) SARS-CoV-2 target nucleic acids are NOT DETECTED.  The SARS-CoV-2 RNA is generally detectable in upper and lower respiratory specimens during the acute phase of infection. The lowest concentration of SARS-CoV-2 viral copies this assay can detect is 250 copies / mL. A negative result does not preclude SARS-CoV-2 infection and should not be used as the sole basis for treatment or other patient management decisions.  A negative result may occur with improper specimen collection / handling, submission of specimen other than nasopharyngeal swab, presence of viral mutation(s) within the areas targeted by this assay, and inadequate number of viral copies (<250 copies / mL). A negative result must be combined with clinical observations, patient history, and epidemiological information.  Fact Sheet for Patients:   StrictlyIdeas.no  Fact Sheet for Healthcare Providers: BankingDealers.co.za  This test is not yet approved or  cleared by the [place] FDA and has been authorized for detection and/or diagnosis of SARS-CoV-2 by FDA under an Emergency Use Authorization (EUA).  This EUA will remain in effect (meaning this test can  be used) for the duration of the COVID-19 declaration under Section 564(b)(1) of the Act, 21 U.S.C. section 360bbb-3(b)(1), unless the authorization is terminated or revoked sooner.  Performed at [facility], [place] [address], [place], [place] [address]          Radiology Studies: MR BRAIN W WO CONTRAST  Result Date: 07/05/2020 CLINICAL DATA:  Brain/CNS neoplasm staging EXAM: MRI HEAD WITHOUT AND WITH CONTRAST TECHNIQUE: Multiplanar, multiecho pulse sequences of the brain and surrounding structures were obtained without and with intravenous contrast. CONTRAST:  52mL GADAVIST GADOBUTROL 1 MMOL/ML IV SOLN COMPARISON:  MRI 05/09/2020 and 05/10/2020 FINDINGS: Brain: Minimal increase in the size of the 2 larger enhancing lesions, which is favored to relate to treatment change given increased  heterogeneity of enhancement of these lesions and areas of peripheral cystic change/necrosis. The smallest lesion in the high left frontoparietal region is smaller. Mild associated susceptibility artifact, compatible with internal hemorrhage. When accounting for differences in technique, similar surrounding exuberant T2/FLAIR hyperintense edema. Similar mild mass effect. Trace rightward midline shift. Basal cisterns are patent. No acute infarct.  No new acute hemorrhage.  No hydrocephalus. Vascular: Major proximal arterial flow voids at the skull base are maintained. Skull and upper cervical spine: No focal marrow replacing lesion. Sinuses/Orbits: Small inferior left maxillary sinus retention cyst. Otherwise, the sinuses are clear. Unremarkable orbits. Other: Small right mastoid effusion. IMPRESSION: 1. Minimal increase in the size of the larger 2 enhancing lesions, favored to reflect treatment change in the setting of interval radiation treatment. Decreased size of the smallest lesion in the high left frontoparietal region. No new enhancing lesions identified. 2. When accounting for differences in  technique, similar associated surrounding edema and mass effect with trace rightward midline shift. No hydrocephalus. Electronically Signed   By: Margaretha [place] MD   On: 07/05/2020 11:37        Scheduled Meds: . chlorhexidine  15 mL Mouth Rinse BID  . Chlorhexidine Gluconate Cloth  6 each Topical Daily  . dexamethasone (DECADRON) injection  6 mg Intravenous Q6H  . insulin aspart  0-9 Units Subcutaneous TID WC  . insulin glargine  20 Units Subcutaneous Daily  . mouth rinse  15 mL Mouth Rinse q12n4p   Continuous Infusions: . levETIRAcetam Stopped (07/05/20 2232)          Aline August, MD [facility] 07/06/2020, 7:15 AM

## 2020-07-07 LAB — GLUCOSE, CAPILLARY
Glucose-Capillary: 285 mg/dL — ABNORMAL HIGH (ref 70–99)
Glucose-Capillary: 356 mg/dL — ABNORMAL HIGH (ref 70–99)
Glucose-Capillary: 339 mg/dL — ABNORMAL HIGH (ref 70–99)
Glucose-Capillary: 389 mg/dL — ABNORMAL HIGH (ref 70–99)
Glucose-Capillary: 337 mg/dL — ABNORMAL HIGH (ref 70–99)

## 2020-07-07 LAB — COMPREHENSIVE METABOLIC PANEL
ALT: 48 U/L — ABNORMAL HIGH (ref 0–44)
AST: 45 U/L — ABNORMAL HIGH (ref 15–41)
Albumin: 3.5 g/dL (ref 3.5–5.0)
Alkaline Phosphatase: 91 U/L (ref 38–126)
Anion gap: 12 (ref 5–15)
BUN: 28 mg/dL — ABNORMAL HIGH (ref 6–20)
CO2: 30 mmol/L (ref 22–32)
Calcium: 9.4 mg/dL (ref 8.9–10.3)
Chloride: 95 mmol/L — ABNORMAL LOW (ref 98–111)
Creatinine, Ser: 0.88 mg/dL (ref 0.44–1.00)
GFR calc Af Amer: >60 mL/min (ref >60)
GFR calc non Af Amer: >60 mL/min (ref >60)
Glucose, Bld: 289 mg/dL — ABNORMAL HIGH (ref 70–99)
Potassium: 5.6 mmol/L — ABNORMAL HIGH (ref 3.5–5.1)
Sodium: 137 mmol/L (ref 135–145)
Total Bilirubin: 1.2 mg/dL (ref 0.3–1.2)
Total Protein: 7 g/dL (ref 6.5–8.1)

## 2020-07-07 LAB — CBC WITH DIFFERENTIAL/PLATELET
Abs Immature Granulocytes: 0.11 10*3/uL — ABNORMAL HIGH (ref 0.00–0.07)
Basophils Absolute: 0 10*3/uL (ref 0.0–0.1)
Basophils Relative: 0 %
Eosinophils Absolute: 0 10*3/uL (ref 0.0–0.5)
Eosinophils Relative: 0 %
HCT: 45 % (ref 36.0–46.0)
Hemoglobin: 14.8 g/dL (ref 12.0–15.0)
Immature Granulocytes: 1 %
Lymphocytes Relative: 6 %
Lymphs Abs: 0.7 10*3/uL (ref 0.7–4.0)
MCH: 32 pg (ref 26.0–34.0)
MCHC: 32.9 g/dL (ref 30.0–36.0)
MCV: 97.4 fL (ref 80.0–100.0)
Monocytes Absolute: 0.7 10*3/uL (ref 0.1–1.0)
Monocytes Relative: 6 %
Neutro Abs: 9.3 10*3/uL — ABNORMAL HIGH (ref 1.7–7.7)
Neutrophils Relative %: 87 %
Platelets: 256 10*3/uL (ref 150–400)
RBC: 4.62 MIL/uL (ref 3.87–5.11)
RDW: 16.5 % — ABNORMAL HIGH (ref 11.5–15.5)
WBC: 10.7 10*3/uL — ABNORMAL HIGH (ref 4.0–10.5)
nRBC: 0 % (ref 0.0–0.2)

## 2020-07-07 LAB — MAGNESIUM: Magnesium: 2.3 mg/dL (ref 1.7–2.4)

## 2020-07-07 MED ORDER — DEXAMETHASONE SODIUM PHOSPHATE 4 MG/ML IJ SOLN
4.0000 mg | Freq: Four times a day (QID) | INTRAMUSCULAR | Status: DC
  Administered 2020-07-07 – 2020-07-08 (×4): 4 mg via INTRAVENOUS
  Filled 2020-07-07 (×4): qty 1

## 2020-07-07 MED ORDER — LEVETIRACETAM 500 MG PO TABS
1500.0000 mg | ORAL_TABLET | Freq: Two times a day (BID) | ORAL | Status: DC
  Administered 2020-07-07 – 2020-07-09 (×5): 1500 mg via ORAL
  Filled 2020-07-07 (×5): qty 3

## 2020-07-07 MED ORDER — INSULIN GLARGINE 100 UNIT/ML ~~LOC~~ SOLN
30.0000 [IU] | Freq: Every day | SUBCUTANEOUS | Status: DC
  Filled 2020-07-07: qty 0.3

## 2020-07-07 MED ORDER — INSULIN ASPART 100 UNIT/ML ~~LOC~~ SOLN
4.0000 [IU] | Freq: Three times a day (TID) | SUBCUTANEOUS | Status: DC
  Administered 2020-07-07 – 2020-07-08 (×3): 4 [IU] via SUBCUTANEOUS

## 2020-07-07 NOTE — Progress Notes (Signed)
Paged hospitalitis regarding patient blood glucose and need for night time insulin.

## 2020-07-07 NOTE — Evaluation (Signed)
Clinical/Bedside Swallow Evaluation Patient Details  Name: Oleva Koo MRN: [id number] Date of Birth: 18-May-1981  Today's Date: 07/07/2020 Time: SLP Start Time (ACUTE ONLY): 1045 SLP Stop Time (ACUTE ONLY): 1105 SLP Time Calculation (min) (ACUTE ONLY): 20 min  Past Medical History:  Past Medical History:  Diagnosis Date  . Cancer ([place])   . Depression   . Glucose intolerance 03/11/2019  . Obesity, Class III, BMI 40-49.9 (morbid obesity) ([place]) 03/11/2019   Past Surgical History:  Past Surgical History:  Procedure Laterality Date  . CESAREAN SECTION    . IRRIGATION AND DEBRIDEMENT ABSCESS  03/15/2019   RETROPHARYNGEAL   . TONSILLECTOMY AND ADENOIDECTOMY N/A 03/15/2019   Procedure: IRRIGATION AND DEBRIDEMENT OF RETROPHARYNGEAL ABSCESS;  Surgeon: Leta Baptist, MD;  Location: [facility] OR;  Service: ENT;  Laterality: N/A;   HPI:  Denver Harder Friel is a 39 y.o. 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  She had been experiencing right upper and lower extremity numbness for the last few weeks and was also placed on Decadron by patient's oncologist.  She was brought to the ER as she had a seizure-like episode with confusion and right-sided weakness.  Patient had a CT of the head which showed 2.4 cm mass in the left frontoparietal area with possible hemorrhage and the edema of the left cerebral area with effacement of the left lateral ventricle.  Patient was started on Keppra and Decadron.  Labs are showing elevated lactic acid around 4 and elevated blood sugar.  MRI of the head was completed on 9/9 showing minimal increase in the size of the larger 2 enhancing leasions, favored to reflect treatment change in the setting of interval radiation treatment, decreased size of the smallest lesion in the high left frontoparietal region with no new enhancing lesions identified.  When accounting for  differences in technique, similar associated surrounding edema and mass effect with trace rightward midline shift was seen.     Assessment / Plan / Recommendation Clinical Impression  Clinical swallowing evaluation was completed using thin liquids via spoon, cup and straw, pureed material and dry solids.  RN reported no obvious issues with intake.  Patient reported no problems with coughing, throat clearing or feeling as if material gets stuck given intake.  Cranial nerve exam was completed and remarkable to lingual deviation to the right during movement and slight facial droop on the right.  Subtle lingual weakness noted on the right.  Jaw range of motion and strength were adequate.  Facial sensation appeared to be intact and she did not endorse a difference in sensation from the right to left side of her face.  She presented with a possible pharyngeal dyshphagia.  Mastication of dry solids appeared adequate.  Swallow trigger was appreciated to palpation.  Consistent overt s/s of aspiration were not seen.  Delayed cough was seen following the 3 ounce water challenge.  Patient reported she does not generally cough with intake.  Recommend she remain on a regular diet with thin liquids.  She should take single sips via a cup and not use straws.  ST will follow for therapeutic diet tolerance and need for possible instrumental exam.   SLP Visit Diagnosis: Dysphagia, unspecified (R13.10)    Aspiration Risk  Mild aspiration risk    Diet Recommendation   Regular/thin.  Single sips NO Straws  Medication Administration: Whole meds with puree    Other  Recommendations Oral Care Recommendations: Oral care BID   Follow up Recommendations Other (comment) (TBD)      Frequency and Duration min 2x/week  2 weeks       Prognosis Prognosis for Safe Diet Advancement: Good      Swallow Study   General Date of Onset: 07/04/20 HPI: Paizley Ramella is a 39 y.o. female with known history of metastatic squamous  cell lung cancer with metastases to adrenals and brain who recently completed brain radiation on May 24, 2020.  She had been experiencing right upper and lower extremity numbness for the last few weeks and was also placed on Decadron by patient's oncologist.  She was brought to the ER as she had a seizure-like episode with confusion and right-sided weakness.  Patient had a CT of the head which showed 2.4 cm mass in the left frontoparietal area with possible hemorrhage and the edema of the left cerebral area with effacement of the left lateral ventricle.  Patient was started on Keppra and Decadron.  Labs are showing elevated lactic acid around 4 and elevated blood sugar.  MRI of the head was completed on 9/9 showing minimal increase in the size of the larger 2 ehancing leasions, favored to reflect treatment change in the setting of interval radiation treatment, decreased size of the smallest lesion in the high left frontoparietal region with no new enhancing lesions identified.  When accounting for differences in technique, similar associated surrounding edema and mass effect with trace rightward midline shift was seen.   Type of Study: Bedside Swallow Evaluation Previous Swallow Assessment: None noted at [facility]. Diet Prior to this Study: Regular;Thin liquids Temperature Spikes Noted: No History of Recent Intubation: No Behavior/Cognition: Alert;Cooperative Oral Cavity Assessment: Within Functional Limits Oral Care Completed by SLP: No Oral Cavity - Dentition: Adequate natural dentition Vision: Functional for self-feeding Self-Feeding Abilities: Able to feed self Patient Positioning: Upright in chair Baseline Vocal Quality: Normal Volitional Swallow: Able to elicit    Oral/Motor/Sensory Function Overall Oral Motor/Sensory Function: Mild impairment Facial ROM: Reduced right;Suspected CN VII (facial) dysfunction Facial Symmetry: Abnormal symmetry right;Suspected CN VII (facial) dysfunction Facial  Strength: Reduced right;Suspected CN VII (facial) dysfunction Facial Sensation: Within Functional Limits Lingual ROM: Reduced right;Suspected CN XII (hypoglossal) dysfunction Lingual Symmetry: Abnormal symmetry right Lingual Strength: Within Functional Limits Mandible: Within Functional Limits   Ice Chips Ice chips: Not tested   Thin Liquid Thin Liquid: Impaired Presentation: Cup;Self Fed;Spoon;Straw Pharyngeal  Phase Impairments: Cough - Delayed (given serial sips of thin liquids via straw)    Nectar Thick Nectar Thick Liquid: Not tested   Honey Thick Honey Thick Liquid: Not tested   Puree Puree: Within functional limits Presentation: Spoon   Solid     Solid: Within functional limits Presentation: [place], [place], [place] Acute Rehab SLP [phone]  Lamar Sprinkles 07/07/2020,11:28 AM

## 2020-07-07 NOTE — Evaluation (Signed)
Physical Therapy Evaluation Patient Details Name: Tara Aguilar MRN: [id number] DOB: Aug 22, 1981 Today's Date: 07/07/2020   History of Present Illness  39 y.o. 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 Patient had a CT of the head which showed 2.4 cm mass in the left frontoparietal area with possible hemorrhage and the edema of the left cerebral area with effacement of the left lateral ventricle  Clinical Impression  Pt admitted with above diagnosis.  Pt currently with functional limitations due to the deficits listed below (see PT Problem List). Pt will benefit from skilled PT to increase their independence and safety with mobility to allow discharge to the venue listed below.  Pt fatigues quickly and requiring overall min assist for mobility at this time.  Pt with decreased use of R UE and hand and feels she may need more assist upon d/c for ADLs.  Recommend CIR at this time.     Follow Up Recommendations CIR;Supervision for mobility/OOB    Equipment Recommendations  Rolling walker with 5" wheels (wide)    Recommendations for Other Services       Precautions / Restrictions Precautions Precautions: Fall Precaution Comments: monitor sats      Mobility  Bed Mobility Overal bed mobility: Needs Assistance Bed Mobility: Supine to Sit     Supine to sit: Supervision;HOB elevated     General bed mobility comments: increased time and effort  Transfers Overall transfer level: Needs assistance Equipment used: Rolling walker (2 wheeled) Transfers: Sit to/from Stand Sit to Stand: Min guard         General transfer comment: verbal cues for  hand placement, min/guard for safety, use of momentum to stand  Ambulation/Gait Ambulation/Gait assistance: Min guard;Min assist Gait Distance (Feet): 40 Feet (x2) Assistive device: Rolling walker (2 wheeled) Gait Pattern/deviations: Step-through pattern;Decreased stride length     General Gait Details: verbal cues for RW positioning, required one seated rest break due to fatigue; SpO2 89-91% room air however decreased to 85% upon sitting on toilet in bathroom so reapplied 2L O2 [place] (RN aware)  Stairs            Wheelchair Mobility    Modified Rankin (Stroke Patients Only)       Balance Overall balance assessment: History of Falls;Needs assistance         Standing balance support: Single extremity supported Standing balance-Leahy Scale: Poor                               Pertinent Vitals/Pain Pain Assessment: No/denies pain    Home Living Family/patient expects to be discharged to:: Private residence Living Arrangements: Children;Other (Comment) Available Help at Discharge: Family Type of Home: House Home Access: Stairs to enter Entrance Stairs-Rails: None Entrance Stairs-Number of Steps: 4 Home Layout: Able to live on main level with bedroom/bathroom Home Equipment: Cane - single point      Prior Function Level of Independence: Independent with assistive device(s)               Hand Dominance        Extremity/Trunk Assessment   Upper Extremity Assessment Upper Extremity  Assessment: RUE deficits/detail RUE Deficits / Details: edematous, report strength deficits since brain met diagnosis, poor coordination and grip strength observed, unable to lift arm more then 45*    Lower Extremity Assessment Lower Extremity Assessment: Generalized weakness       Communication   Communication: No difficulties  Cognition Arousal/Alertness: Awake/alert Behavior During Therapy: Flat affect Overall Cognitive Status: Within Functional Limits for  tasks assessed                                        General Comments      Exercises     Assessment/Plan    PT Assessment Patient needs continued PT services  PT Problem List Decreased strength;Decreased activity tolerance;Decreased mobility;Decreased knowledge of use of DME;Decreased range of motion;Decreased coordination;Decreased balance       PT Treatment Interventions DME instruction;Therapeutic exercise;Stair training;Functional mobility training;Therapeutic activities;Patient/family education;Balance training;Neuromuscular re-education;Gait training    PT Goals (Current goals can be found in the Care Plan section)  Acute Rehab PT Goals PT Goal Formulation: With patient Time For Goal Achievement: 07/21/20 Potential to Achieve Goals: Good    Frequency Min 3X/week   Barriers to discharge        Co-evaluation               AM-PAC PT "6 Clicks" Mobility  Outcome Measure Help needed turning from your back to your side while in a flat bed without using bedrails?: A Little Help needed moving from lying on your back to sitting on the side of a flat bed without using bedrails?: A Little Help needed moving to and from a bed to a chair (including a wheelchair)?: A Little Help needed standing up from a chair using your arms (e.g., wheelchair or bedside chair)?: A Little Help needed to walk in hospital room?: A Little Help needed climbing 3-5 steps with a railing? : A Lot 6 Click Score: 17    End of Session Equipment Utilized During Treatment: Gait belt Activity Tolerance: Patient tolerated treatment well Patient left:  (bathroom with nurse tech) Nurse Communication: Mobility status PT Visit Diagnosis: Other symptoms and signs involving the nervous system (R29.898);Difficulty in walking, not elsewhere classified (R26.2)    Time: 6431-4276 PT Time Calculation (min) (ACUTE ONLY): 20 min   Charges:   PT Evaluation $PT Eval Low Complexity: 1 Low         Kati PT, DPT Acute Rehabilitation Services Pager: [phone] Office: [phone]  Trena Platt 07/07/2020, 12:48 PM

## 2020-07-07 NOTE — Progress Notes (Signed)
Inpatient Diabetes Program Recommendations  AACE/ADA: New Consensus Statement on Inpatient Glycemic Control (2015)  Target Ranges:  Prepandial:   less than 140 mg/dL      Peak postprandial:   less than 180 mg/dL (1-2 hours)      Critically ill patients:  140 - 180 mg/dL   Lab Results  Component Value Date   GLUCAP 368 (H) 07/06/2020   HGBA1C 10.4 (H) 07/05/2020    Review of Glycemic Control  CBGs today: 315, 309, 368 mg/dL  Current orders for Inpatient glycemic control: Lantus 25 units QD, Novolog 0-9 units tidwc  On Decadron 6 mg Q6H HgbA1C - 10.4%  Inpatient Diabetes Program Recommendations:     Increase Lantus to 30 units QD Add Novolog 4 units tidwc for meal coverage insulin if pt eats > 50% meal  Spoke with patient about new diabetes diagnosis.  Discussed A1C results (10.4%) and explained what an A1C is and informed patient that his current A1C indicates an average glucose of 252 mg/dl over the past 2-3 months. Discussed basic pathophysiology of DM Type 2, basic home care, importance of checking CBGs and maintaining good CBG control to prevent complications. Reviewed signs and symptoms of hyperglycemia and hypoglycemia along with treatment for both. Discussed impact of nutrition, exercise, stress, sickness, and medications on diabetes control. Reviewed Living Well with diabetes booklet and encouraged patient to read through entire book.   Educated patient on insulin pen use at home.  Reviewed all steps if insulin pen including attachment of needle, 2-unit air shot, dialing up dose, giving injection, removing needle, disposal of sharps, storage of unused insulin, disposal of insulin etc. Patient able to provide successful return demonstration. Also reviewed troubleshooting with insulin pen. MD to give patient Rxs for insulin pens and insulin pen needles.  For discharge:  Lantus Solostar pen [phone]) Novolog Flexpen [phone]) Insulin pen needles [phone]) Blood glucose meter  kit (#[id number])    Continue to follow.  Thank you. Lorenda Peck, RD, LDN, CDE Inpatient Diabetes Coordinator [phone]

## 2020-07-07 NOTE — Progress Notes (Signed)
Daily Progress Note   Patient Name: Tara Aguilar       Date: 07/07/2020 DOB: [birth year]  Age: 39 y.o. MRN#: [id number] Attending Physician: Aline August, MD Primary Care Physician: Glenda Chroman, MD Admit Date: 07/04/2020  Reason for Consultation/Follow-up: Establishing goals of care  Subjective: I met today with Tara Aguilar, her close friend, Tara Aguilar, and her sister, Tara Aguilar.  Mariyam was much more awake and alert today and able to participate in conversation.  We discussed about the things that are most important to Ms. Altice.  The focus for her is to be able to spend as much time as possible with her son, Rodman Key.  Rodman Key is 11 and also lost his father to cancer.  At the same time, family is very realistic about the severity of her illness and want to focus on adding as much quality time as possible to her life.  She has great support from [place] and they both are focused on helping [place] in any way possible. [place] and her husband want her to be at home as much as possible and are willing to be her in-home support when the time comes for her to transition to hospice services.  They really want her to be at home as this will allow her to see her son as much as possible and this would be limited if she is in any other kind of facility or residential hospice setting.  We discussed all possible options for her care plan moving forward including home hospice, home with home health, transition to skilled facility for trial of rehab, or pursuing evaluation for CIR.  We also discussed advance care planning including recommendation for completion of HCPOA and MOST form.  Length of Stay: 3  Current Medications: Scheduled Meds:  . chlorhexidine  15 mL Mouth Rinse BID  . Chlorhexidine  Gluconate Cloth  6 each Topical Daily  . dexamethasone (DECADRON) injection  4 mg Intravenous Q6H  . insulin aspart  0-9 Units Subcutaneous TID WC  . insulin aspart  4 Units Subcutaneous TID WC  . [START ON 07/08/2020] insulin glargine  30 Units Subcutaneous Daily  . levETIRAcetam  1,500 mg Oral BID  . mouth rinse  15 mL Mouth Rinse q12n4p    Continuous Infusions:   PRN Meds: ondansetron **OR** ondansetron (ZOFRAN)  IV  Physical Exam        General: Alert, awake, in no acute distress. Chronically ill appearing Resp: Decreased air movement, scattered crackles in bases Abdomen: Nondistended Ext: No significant edema Skin: Warm and dry Neuro: Grossly intact, nonfocal.  More awake today.  Vital Signs: BP (!) 123/91 (BP Location: Left Arm)   Pulse 98   Temp 97.7 F (36.5 C) (Oral)   Resp 18   Ht $R'5\' 6"'Ed$  (1.676 m)   Wt (!) 136.7 kg   SpO2 98%   BMI 48.64 kg/m  SpO2: SpO2: 98 % O2 Device: O2 Device: Room Air O2 Flow Rate: O2 Flow Rate (L/min): 2 L/min  Intake/output summary:   Intake/Output Summary (Last 24 hours) at 07/07/2020 2301 Last data filed at 07/07/2020 0900 Gross per 24 hour  Intake 500 ml  Output --  Net 500 ml   LBM: Last BM Date: 07/02/20 Baseline Weight: Weight: (!) 136.7 kg Most recent weight: Weight: (!) 136.7 kg       Palliative Assessment/Data:      Patient Active Problem List   Diagnosis Date Noted  . Brain metastasis ([place]) 07/04/2020  . Palliative care by specialist   . Lung cancer ([place]) 03/28/2020  . 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 Squamous cell lung cancer, unspecified laterality ([place]) 04/05/2019  . Goals of care, counseling/discussion 04/05/2019  . 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 Glucose intolerance 03/11/2019  . Obesity, Class III, BMI 40-49.9  (morbid obesity) ([place]) 03/11/2019  . Tobacco use 03/11/2019  . Dental abscess 03/07/2019  . 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 Depression     Palliative Care Assessment & Plan   Patient Profile: 39 y.o. 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  She had been on Decadron following radiation but this was stopped prior to intended stop date and recently restarted at low-dose by radiation oncology.  Following presentation to the hospital, high-dose steroids were reinitiated and her mental status certainly appears to be improved following this increase. Palliative consulted for goals of care.  Recommendations/Plan:  Full code/ Full Scope treatment  Ms. Chatham is clear that she is not at a point where she wants to consider electing hospice at this time.  While she is open to the idea in the future, her goal at this point is to see how much functional status she can regain while waiting to see how much benefit she gains from reinitiation of high-dose steroids.  She wants to see how she continues to do with continuation of steroids and follow-up with Dr. Mickeal Skinner, radiation oncology, and Dr. Delton Coombes to discuss next steps.  She would like to see if she is a candidate for CIR.  If not, she would like to pursue placement at skilled facility for trial of rehab.   She would like for her friend, Tara Aguilar, and 2 of her sisters Tara Aguilar and Diane?)  To act as her Air traffic controller in the event she cannot make her own medical decisions.  Will request spiritual care to follow-up for completion of [facility] POA paperwork on Monday.  We discussed regarding CODE STATUS, limitations  of care, how to develop a plan to focus on interventions that are most likely  to be helpful in her goal of seeing her son, and consideration for completion of MOST form.    Plan for follow-up tomorrow at 2 PM to continue conversation regarding advanced care planning.   Goals of Care and Additional Recommendations:  Limitations on Scope of Treatment: Full Scope Treatment  Code Status:    Code Status Orders  (From admission, onward)         Start     Ordered   07/04/20 2348  Full code  Continuous        07/04/20 2349        Code Status History    Date Active Date Inactive Code Status Order ID Comments User Context   03/28/2020 1800 03/30/2020 0003 Full Code [id number]  Roxan Hockey, MD Inpatient   03/11/2019 2000 03/22/2019 1743 Full Code [id number]  Reubin Milan, MD ED   03/05/2019 2015 03/08/2019 1435 Full Code [id number]  Truett Mainland, DO Inpatient   Advance Care Planning Activity       Prognosis:   Guarded  Discharge Planning:  [place] for rehab with Palliative care service follow-up vs CIR  Care plan was discussed with patient, sister, friend, and Dr. Starla Link  Thank you for allowing the Palliative Medicine Team to assist in the care of this patient.   Time In: 1415 Time Out: 1530 Total Time 75 Prolonged Time Billed Yes      Greater than 50%  of this time was spent counseling and coordinating care related to the above assessment and plan.  Micheline Rough, MD  Please contact Palliative Medicine Team phone at [phone] for questions and concerns.

## 2020-07-07 NOTE — Consult Note (Signed)
Consultation Note Date: 07/07/2020   Patient Name: Tara Aguilar  DOB: 03/24/1981  MRN: [id number]  Age / Sex: 39 y.o., female  PCP: Glenda Chroman, MD Referring Physician: Aline August, MD  Reason for Consultation: Establishing goals of care  HPI/Patient Profile: 39 y.o. 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  She had been on Decadron following radiation but this was stopped prior to intended stop date and recently restarted at low-dose by radiation oncology.  Palliative consulted for goals of care.  Clinical Assessment and Goals of Care: Palliative care consult received.  Chart reviewed including personal review of pertinent labs and imaging.  I reached out to Dr. Mauri Reading (neurooncology) and Worthy Flank from radiation oncology prior to meeting with [name].  Appreciate their expertise greatly.  I met today with [name].  She was lying in bed in no distress but she remains sleepy. While she answered questions appropriately, I am not sure she entirely followed all of my conversation with her.  We discussed clinical course as well as wishes moving forward in regard to advanced directives.  Concepts specific to code status and rehospitalization discussed.  We discussed difference between a aggressive medical intervention path and a palliative, comfort focused care path.  Values and goals of care important to patient and family were attempted to be elicited.  I also then called and spoke individually with her friend, Alfonso Ramus, and her sister, Sonia Baller.  We discussed my conversation with radiation oncology team and Dr. Mickeal Skinner.  We talked about the fact that she is continuing to have changes in her  nutrition, cognition, and functional status and that these are signs of disease progression, however, she also has significant edema and inflammation that may improve with the high-dose steroids recommended by Dr. Mickeal Skinner.  We discussed that while the overall goal is palliative in nature, she may regain some functional status with high-dose steroids.  They understand that any gains would be temporary as her disease will continue to progress over time.  We discussed options moving forward of pursuing hospice care (her friend states that she and her husband will take care of her at home with hospice when that time comes) versus trial of rehab and continuation of steroids with close follow-up to see if she is able to have some temporary regain of her function and cognition to enjoy more time with her 5 year old son.  Both sister and friend are clear that having meaningful time with her son is the absolute top priority for [name].  Questions and concerns addressed.   PMT will continue to support holistically.  SUMMARY OF RECOMMENDATIONS    Full code/full scope  Discussed with rad onc and Dr. Mickeal Skinner.  She certainly has had significant decline in her nutrition, cognition, and functional status over the past month, but she had also  stopped taking steroids and has significant edema/inflammation.  Dr. Mickeal Skinner and I conferred and recommendation is that she may need more time on high dose steroids to determine what her new baseline is going to be moving forward.  Likely plan will be to rehab to continue steroids to see how much function she can regain.  If she continues to decline, her friend, Alfonso Ramus, and her husband are open to caring for her at home with the support of home hospice.  Discussed that even with some temporary stability, long term, she is going to continue to decline and hospice will be a good way to support her at some point in the near future.  Plan for follow-up meeting with [place]  tomorrow at [facility] and also possibly with her sister when she arrives in town on Monday if [place] is still admitted at that time.   Code Status/Advance Care Planning:  Full code- plan to address further tomorrow   Symptom Management:   Continue high dose steroids  Palliative Prophylaxis:   Delirium Protocol and Frequent Pain Assessment  Additional Recommendations (Limitations, Scope, Preferences):  Full Scope Treatment  Psycho-social/Spiritual:   Desire for further Chaplaincy support:no  Additional Recommendations: Education on Hospice  Prognosis:   Unable to determine  Discharge Planning: [place] for rehab with Palliative care service follow-up vs home with hospice     Primary Diagnoses: Present on Admission: . Squamous cell lung cancer, unspecified laterality ([place]) . Glucose intolerance . Obesity, Class III, BMI 40-49.9 (morbid obesity) ([place]) . Brain metastasis ([place])   I have reviewed the medical record, interviewed the patient and family, and examined the patient. The following aspects are pertinent.  Past Medical History:  Diagnosis Date  . Cancer ([place])   . Depression   . Glucose intolerance 03/11/2019  . Obesity, Class III, BMI 40-49.9 (morbid obesity) ([place]) 03/11/2019   Social History   Socioeconomic History  . Marital status: Widowed    Spouse name: Not on file  . Number of children: 1  . Years of education: Not on file  . Highest education level: Not on file  Occupational History  . Not on file  Tobacco Use  . Smoking status: Current Some Day Smoker    Packs/day: 1.00    Years: 25.00    Pack years: 25.00    Types: Cigarettes  . Smokeless tobacco: Never Used  Vaping Use  . Vaping Use: Never used  Substance and Sexual Activity  . Alcohol use: Not Currently  . Drug use: Not Currently  . Sexual activity: Not on file  Other Topics Concern  . Not on file  Social History Narrative   03/24/2019   Husband passed away four months  ago from Cancer of the [place].   Pt. Has 41 yo son at home.   Social Determinants of Health   Financial Resource Strain:   . Difficulty of Paying Living Expenses: Not on file  Food Insecurity:   . Worried About Charity fundraiser in the Last Year: Not on file  . Ran Out of Food in the Last Year: Not on file  Transportation Needs:   . Lack of Transportation (Medical): Not on file  . Lack of Transportation (Non-Medical): Not on file  Physical Activity:   . Days of Exercise per Week: Not on file  . Minutes of Exercise per Session: Not on file  Stress:   . Feeling of Stress : Not on file  Social Connections:   . Frequency of  Communication with Friends and Family: Not on file  . Frequency of Social Gatherings with Friends and Family: Not on file  . Attends Religious Services: Not on file  . Active Member of Clubs or Organizations: Not on file  . Attends Archivist Meetings: Not on file  . Marital Status: Not on file   Family History  Problem Relation Age of Onset  . Lung cancer Mother   . Hypertension Mother   . Leukemia Father   . Myelodysplastic syndrome Father    Scheduled Meds: . chlorhexidine  15 mL Mouth Rinse BID  . Chlorhexidine Gluconate Cloth  6 each Topical Daily  . dexamethasone (DECADRON) injection  6 mg Intravenous Q6H  . insulin aspart  0-9 Units Subcutaneous TID WC  . insulin glargine  25 Units Subcutaneous Daily  . mouth rinse  15 mL Mouth Rinse q12n4p   Continuous Infusions: . levETIRAcetam 1,500 mg (07/06/20 2228)   PRN Meds:.ondansetron **OR** ondansetron (ZOFRAN) IV Medications Prior to Admission:  Prior to Admission medications   Medication Sig Start Date End Date Taking? Authorizing Provider  albuterol (PROVENTIL) (2.5 MG/3ML) 0.083% nebulizer solution Take 3 mLs (2.5 mg total) by nebulization every 2 (two) hours as needed for wheezing. 03/29/20  Yes Emokpae, Courage, MD  albuterol (VENTOLIN HFA) 108 (90 Base) MCG/ACT inhaler Inhale 2 puffs  into the lungs every 6 (six) hours as needed for wheezing or shortness of breath. 03/29/20  Yes Emokpae, Courage, MD  apixaban (ELIQUIS) 5 MG TABS tablet Take 1 tablet (5 mg total) by mouth 2 (two) times daily. 03/29/20  Yes Emokpae, Courage, MD  cephALEXin (KEFLEX) 500 MG capsule Take 1 capsule (500 mg total) by mouth 2 (two) times daily. 06/27/20  Yes Lockamy, Randi L, NP-C  gabapentin (NEURONTIN) 300 MG capsule Take 1 capsule (300 mg total) by mouth 3 (three) times daily. Take 300 mg in the morning, 300 mg at noon and 600 mg at bedtime. Patient taking differently: Take 300 mg by mouth 3 (three) times daily as needed (neuropathy).  10/19/19  Yes Derek Jack, MD  ibuprofen (ADVIL) 200 MG tablet Take 200 mg by mouth every 6 (six) hours as needed for fever, headache or mild pain.   Yes [provider]  dexamethasone (DECADRON) 4 MG tablet 1/2 tablet po daily, taper instructions to follow 07/03/20   Hayden Pedro, PA-C  Nebulizers (COMPRESSOR/NEBULIZER) MISC 1 Units by Does not apply route as needed. Nebulizer treatments as needed for shortness of breath cough and wheezing Patient not taking: Reported on 06/14/2020 03/29/20   Roxan Hockey, MD  oxyCODONE (OXY IR/ROXICODONE) 5 MG immediate release tablet Take 1 tablet (5 mg total) by mouth every 4 (four) hours as needed for severe pain. Patient not taking: Reported on 06/14/2020 10/12/19   Francene Finders L, NP-C  prochlorperazine (COMPAZINE) 10 MG tablet Take 1 tablet (10 mg total) by mouth every 6 (six) hours as needed (Nausea or vomiting). Patient not taking: Reported on 06/14/2020 04/06/19   Derek Jack, MD   No Known Allergies Review of Systems  Denies all complaints, but limited secondary to sleepiness  Physical Exam  General: Alert, awake, in no acute distress. Chronically ill appearing Heart: Regular rate and rhythm. No murmur appreciated. Lungs: Decreased air movement, scattered crackles in bases Abdomen: Soft,  nontender, nondistended, positive bowel sounds.  Ext: No significant edema Skin: Warm and dry Neuro: Grossly intact, nonfocal. Sleepy.  Vital Signs: BP (!) 127/91   Pulse 81   Temp 97.6  F (36.4 C) (Oral)   Resp 20   Ht $R'5\' 6"'PF$  (0.045 m)   Wt (!) 136.7 kg   SpO2 96%   BMI 48.64 kg/m  Pain Scale: 0-10   Pain Score: 0-No pain   SpO2: SpO2: 96 % O2 Device:SpO2: 96 % O2 Flow Rate: .O2 Flow Rate (L/min): 2 L/min  IO: Intake/output summary:   Intake/Output Summary (Last 24 hours) at 07/07/2020 0834 Last data filed at 07/06/2020 2036 Gross per 24 hour  Intake 460 ml  Output 850 ml  Net -390 ml    LBM: Last BM Date: 07/02/20 Baseline Weight: Weight: (!) 136.7 kg Most recent weight: Weight: (!) 136.7 kg     Palliative Assessment/Data:     Time In: 1045 Time Out: 1205 Time Total: 80  Greater than 50%  of this time was spent counseling and coordinating care related to the above assessment and plan.  Signed by: Micheline Rough, MD   Please contact Palliative Medicine Team phone at [phone] for questions and concerns.  For individual provider: See Shea Evans

## 2020-07-07 NOTE — Progress Notes (Signed)
Patient ID: Tara Aguilar, female   DOB: August 16, 1981, 39 y.o.   MRN: [id number]  PROGRESS NOTE    Tara Aguilar  EQA:[id number] DOB: May 23, 1981 DOA: 07/04/2020 PCP: Glenda Chroman, MD   Brief Narrative:  39 y.o. 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 Patient had a CT of the head which showed 2.4 cm mass in the left frontoparietal area with possible hemorrhage and the edema of the left cerebral area with effacement of the left lateral ventricle.  Patient was started on Keppra and Decadron and discussed with patient's oncologist/Dr. Delton Coombes who advised that patient be transferred to [facility] for radiation oncology evaluation.  Assessment & Plan:   Seizure and confusion in a patient with history of known metastasis to brain -Patient has a known history of lung cancer with metastasis to brain and recently completed brain radiation on May 24, 2020 and has been experiencing right upper and lower extremity numbness for the past few weeks and was also placed on Decadron by patient's oncologist.  Presented with seizure like episode of confusion with right-sided weakness. -CT of the head showed 2.4 cm mass in the left frontoparietal area with possible hemorrhage and the edema of the left cerebral area with effacement of the left lateral ventricle. -Currently on Keppra and IV Decadron. MRI of the brain showed minimal increase in the size of the larger 2 enhancing lesions, favored to reflect treatment change in the setting of interval radiation treatment; decreased size of the smallest lesion in the high left frontoparietal region; no new enhancing lesions  identified. When accounting for differences in technique, similar associated surrounding edema and mass effect with trace rightward midline shift. No hydrocephalus. -No need for radiation at this time as per radiation oncology.  Secure chatted with neuro oncology/Dr. Mickeal Skinner on 07/05/20 who recommended high doses of IV Decadron, upto total of 24 mg a day while hospitalized and switch to around 4 mg daily once patient is stable and not postictal.  Spoke to neurology/Dr. Rory Percy on phone on 07/05/2020 who recommended EEG.  -Spoke to Dr. Arora/neurology again on 07/06/2020 on phone who stated that EEG did not show any obvious seizure activities.  We will hold off on official neurology consultation -Seizure precautions/aspiration precautions.   -Outpatient follow-up with oncology -PT eval  -Tolerating diet.  Switch to oral Keppra decrease Decadron to 4 mg IV every 6 hours.  Metastatic squamous cell cancer -Being followed up by Dr. Katragadda/oncology at [facility] -Palliative care evaluation for goals of care discussion is pending.  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 Increase Lantus to 30 units daily along with CBGs with SSI.  Add NovoLog with meals.  Hyperkalemia -Probably spurious lab result.  Repeat a.m. labs.  Leukocytosis -Probably reactive.  Resolved  Morbid obesity -Outpatient follow-up  Tobacco abuse -Needs to stop smoking  History of left basilic vein thrombosis -Eliquis on hold for now. Will resume Eliquis if okay with neuro oncology  Generalized conditioning -Overall prognosis is very poor. Palliative care following. -PT eval pending   DVT prophylaxis: SCDs.  Avoiding anticoagulation due to possible hemorrhage in the brain mass Code  Status: Full Family Communication: None at bedside Disposition Plan: Status is: Inpatient  Remains inpatient appropriate because:Inpatient level of care appropriate due to  severity of illness.  Pending PT eval.   Dispo: The patient is from: Home              Anticipated d/c is to: Home              Anticipated d/c date is: 1 day              Patient currently is not medically stable to d/c.   Consultants: Radiation oncology.  Discussed with Dr. Mickeal Skinner via secure chat.  Spoke to on-call neurologist/Dr. Rory Percy on phone on 07/05/2020 Palliative care  Procedures: EEG  IMPRESSION: This study is suggestive of cortical dysfunction in left hemisphere likely secondary to underlying structural abnormality, cerebral mets, postictal state.  No seizures or epileptiform discharges were seen throughout the recording.  Antimicrobials: None   Subjective: Patient seen and examined at bedside.  Poor historian.  Does not engage in conversation much.  Denies any overnight fever, vomiting, seizures.  Awake, answers questions appropriately.  Denies headaches. Objective: Vitals:   07/06/20 2036 07/06/20 2119 07/07/20 0209 07/07/20 0609  BP: (!) 103/47 120/82 123/89 (!) 127/91  Pulse: 93 91 90 81  Resp: (!) 28 20 20 20   Temp:  97.8 F (36.6 C) 97.7 F (36.5 C) 97.6 F (36.4 C)  TempSrc:  Oral Oral Oral  SpO2: 91% 96% 96% 96%  Weight:      Height:        Intake/Output Summary (Last 24 hours) at 07/07/2020 0826 Last data filed at 07/06/2020 2036 Gross per 24 hour  Intake 460 ml  Output 850 ml  Net -390 ml   Filed Weights   07/04/20 2157  Weight: (!) 136.7 kg    Examination:  General exam: No acute distress.  Looks chronically ill. Respiratory system: Bilateral decreased breath sounds at bases with some scattered crackles  Cardiovascular system: S1-S2 heard, rate controlled Gastrointestinal system: Abdomen is morbidly obese, nondistended, soft and nontender.  Normal bowel sounds heard Extremities: Trace lower extremity edema present.  No cyanosis  Central nervous system: Awake, answers questions appropriately and is oriented.  No focal neurological deficits.   Moves extremities; still slightly weak on the right side Skin: No obvious rashes/ulcers Psychiatry: Extremely flat affect.  Does not engage in conversation much  Data Reviewed: I have personally reviewed following labs and imaging studies  CBC: Recent Labs  Lab 07/05/20 0000 07/05/20 0249 07/06/20 0255 07/07/20 0552  WBC 12.0* 12.1* 11.7* 10.7*  NEUTROABS 10.5*  --  10.2* 9.3*  HGB 14.5 14.2 14.4 14.8  HCT 44.1 43.0 44.2 45.0  MCV 98.0 97.7 97.6 97.4  PLT 258 258 246 500   Basic Metabolic Panel: Recent Labs  Lab 07/05/20 0000 07/05/20 0249 07/06/20 0255 07/07/20 0552  NA 136 135 137 137  K 4.5 4.3 4.9 5.6*  CL 98 99 99 95*  CO2 27 27 29 30   GLUCOSE 250* 253* 314* 289*  BUN 10 12 22* 28*  CREATININE 0.68 0.67 0.77 0.88  CALCIUM 9.3 9.1 9.4 9.4  MG  --   --  2.2 2.3   GFR: Estimated Creatinine Clearance: 119.9 mL/min (by C-G formula based on SCr of 0.88 mg/dL). Liver Function Tests: Recent Labs  Lab 07/05/20 0000 07/06/20 0255 07/07/20 0552  AST 27 18 45*  ALT 61* 52* 48*  ALKPHOS 93 96 91  BILITOT 1.0 0.4  1.2  PROT 6.8 7.1 7.0  ALBUMIN 3.6 3.6 3.5   No results for input(s): LIPASE, AMYLASE in the last 168 hours. No results for input(s): AMMONIA in the last 168 hours. Coagulation Profile: No results for input(s): INR, PROTIME in the last 168 hours. Cardiac Enzymes: No results for input(s): CKTOTAL, CKMB, CKMBINDEX, TROPONINI in the last 168 hours. BNP (last 3 results) No results for input(s): PROBNP in the last 8760 hours. HbA1C: Recent Labs    07/05/20 0000  HGBA1C 10.4*   CBG: Recent Labs  Lab 07/06/20 0753 07/06/20 1211 07/06/20 1633 07/06/20 2128 07/07/20 0739  GLUCAP 315* 315* 309* 368* 285*   Lipid Profile: No results for input(s): CHOL, HDL, LDLCALC, TRIG, CHOLHDL, LDLDIRECT in the last 72 hours. Thyroid Function Tests: No results for input(s): TSH, T4TOTAL, FREET4, T3FREE, THYROIDAB in the last 72 hours. Anemia Panel: No results  for input(s): VITAMINB12, FOLATE, FERRITIN, TIBC, IRON, RETICCTPCT in the last 72 hours. Sepsis Labs: Recent Labs  Lab 07/05/20 0000 07/05/20 0249  LATICACIDVEN 1.2 1.1    Recent Results (from the past 240 hour(s))  MRSA PCR Screening     Status: None   Collection Time: 07/04/20  9:59 PM   Specimen: Nasal Mucosa; Nasopharyngeal  Result Value Ref Range Status   MRSA by PCR NEGATIVE NEGATIVE Final    Comment:        The GeneXpert MRSA Assay (FDA approved for NASAL specimens only), is one component of a comprehensive MRSA colonization surveillance program. It is not intended to diagnose MRSA infection nor to guide or monitor treatment for MRSA infections. Performed at [facility], [place] [address], [place], [place] [address]   SARS Coronavirus 2 by RT PCR (hospital order, performed in [facility] hospital lab) Nasopharyngeal Nasopharyngeal Swab     Status: None   Collection Time: 07/05/20 12:17 AM   Specimen: Nasopharyngeal Swab  Result Value Ref Range Status   SARS Coronavirus 2 NEGATIVE NEGATIVE Final    Comment: (NOTE) SARS-CoV-2 target nucleic acids are NOT DETECTED.  The SARS-CoV-2 RNA is generally detectable in upper and lower respiratory specimens during the acute phase of infection. The lowest concentration of SARS-CoV-2 viral copies this assay can detect is 250 copies / mL. A negative result does not preclude SARS-CoV-2 infection and should not be used as the sole basis for treatment or other patient management decisions.  A negative result may occur with improper specimen collection / handling, submission of specimen other than nasopharyngeal swab, presence of viral mutation(s) within the areas targeted by this assay, and inadequate number of viral copies (<250 copies / mL). A negative result must be combined with clinical observations, patient history, and epidemiological information.  Fact Sheet for Patients:     StrictlyIdeas.no  Fact Sheet for Healthcare Providers: BankingDealers.co.za  This test is not yet approved or  cleared by the [place] FDA and has been authorized for detection and/or diagnosis of SARS-CoV-2 by FDA under an Emergency Use Authorization (EUA).  This EUA will remain in effect (meaning this test can be used) for the duration of the COVID-19 declaration under Section 564(b)(1) of the Act, 21 U.S.C. section 360bbb-3(b)(1), unless the authorization is terminated or revoked sooner.  Performed at [facility], [place] [address] [place], [place] [address]          Radiology Studies: EEG  Result Date: 07/06/2020 Lora Havens, MD     07/06/2020 11:28 AM Patient Name: Tara Aguilar MRN: [id number]  Epilepsy Attending: Lora Havens Referring Physician/Provider: Dr Daryel November Date: 07/05/2020 Duration: 24.39 minutes Patient history: 39 year old female with 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  EEG to evaluate for seizures. Level of alertness: Awake AEDs during EEG study: Keppra Technical aspects: This EEG study was done with scalp electrodes positioned according to the 10-20 International system of electrode placement. Electrical activity was acquired at a sampling rate of 500Hz  and reviewed with a high frequency filter of 70Hz  and a low frequency filter of 1Hz . EEG data were recorded continuously and digitally stored. Description: No clear posterior dominant rhythm was seen.  EEG showed condyle generalized more for 3 to 6 Hz theta-delta slowing.  Intermittent rhythmic 2 to 3 Hz delta slowing was also seen in left hemisphere.  Hyperventilation and photic stimulation were not performed.   ABNORMALITY -Continuous slow, generalized - Intermittent rhythmic delta slow, left hemisphere IMPRESSION: This study is suggestive of  cortical dysfunction in left hemisphere likely secondary to underlying structural abnormality, cerebral mets, postictal state.  No seizures or epileptiform discharges were seen throughout the recording. Lora Havens   MR BRAIN W WO CONTRAST  Result Date: 07/05/2020 CLINICAL DATA:  Brain/CNS neoplasm staging EXAM: MRI HEAD WITHOUT AND WITH CONTRAST TECHNIQUE: Multiplanar, multiecho pulse sequences of the brain and surrounding structures were obtained without and with intravenous contrast. CONTRAST:  47mL GADAVIST GADOBUTROL 1 MMOL/ML IV SOLN COMPARISON:  MRI 05/09/2020 and 05/10/2020 FINDINGS: Brain: Minimal increase in the size of the 2 larger enhancing lesions, which is favored to relate to treatment change given increased heterogeneity of enhancement of these lesions and areas of peripheral cystic change/necrosis. The smallest lesion in the high left frontoparietal region is smaller. Mild associated susceptibility artifact, compatible with internal hemorrhage. When accounting for differences in technique, similar surrounding exuberant T2/FLAIR hyperintense edema. Similar mild mass effect. Trace rightward midline shift. Basal cisterns are patent. No acute infarct.  No new acute hemorrhage.  No hydrocephalus. Vascular: Major proximal arterial flow voids at the skull base are maintained. Skull and upper cervical spine: No focal marrow replacing lesion. Sinuses/Orbits: Small inferior left maxillary sinus retention cyst. Otherwise, the sinuses are clear. Unremarkable orbits. Other: Small right mastoid effusion. IMPRESSION: 1. Minimal increase in the size of the larger 2 enhancing lesions, favored to reflect treatment change in the setting of interval radiation treatment. Decreased size of the smallest lesion in the high left frontoparietal region. No new enhancing lesions identified. 2. When accounting for differences in technique, similar associated surrounding edema and mass effect with trace rightward midline  shift. No hydrocephalus. Electronically Signed   By: Margaretha Sheffield MD   On: 07/05/2020 11:37        Scheduled Meds: . chlorhexidine  15 mL Mouth Rinse BID  . Chlorhexidine Gluconate Cloth  6 each Topical Daily  . dexamethasone (DECADRON) injection  6 mg Intravenous Q6H  . insulin aspart  0-9 Units Subcutaneous TID WC  . insulin glargine  25 Units Subcutaneous Daily  . mouth rinse  15 mL Mouth Rinse q12n4p   Continuous Infusions: . levETIRAcetam 1,500 mg (07/06/20 2228)          Aline August, MD [facility] 07/07/2020, 8:26 AM

## 2020-07-07 NOTE — Progress Notes (Signed)
Inpatient Rehab Admissions Coordinator Note:   Per PT recommendation, pt was screened for CIR candidacy by Gayland Curry, MS, CCC-SLP.  At this time we are not recommending an Inpatient Rehab consult. Will continue to monitor pt's tolerance and progress with therapies to help determine if pt would be an appropriate CIR candidate.  Please contact me with questions.    Gayland Curry, [place], [place] Admissions Coordinator [phone]  07/07/20 2:33 PM

## 2020-07-08 LAB — BASIC METABOLIC PANEL
Anion gap: 10 (ref 5–15)
BUN: 27 mg/dL — ABNORMAL HIGH (ref 6–20)
CO2: 31 mmol/L (ref 22–32)
Calcium: 9.6 mg/dL (ref 8.9–10.3)
Chloride: 96 mmol/L — ABNORMAL LOW (ref 98–111)
Creatinine, Ser: 0.76 mg/dL (ref 0.44–1.00)
GFR calc Af Amer: >60 mL/min (ref >60)
GFR calc non Af Amer: >60 mL/min (ref >60)
Glucose, Bld: 297 mg/dL — ABNORMAL HIGH (ref 70–99)
Potassium: 4.5 mmol/L (ref 3.5–5.1)
Sodium: 137 mmol/L (ref 135–145)

## 2020-07-08 LAB — GLUCOSE, CAPILLARY
Glucose-Capillary: 284 mg/dL — ABNORMAL HIGH (ref 70–99)
Glucose-Capillary: 347 mg/dL — ABNORMAL HIGH (ref 70–99)
Glucose-Capillary: 325 mg/dL — ABNORMAL HIGH (ref 70–99)
Glucose-Capillary: 340 mg/dL — ABNORMAL HIGH (ref 70–99)

## 2020-07-08 LAB — MAGNESIUM: Magnesium: 1.9 mg/dL (ref 1.7–2.4)

## 2020-07-08 MED ORDER — INSULIN ASPART 100 UNIT/ML ~~LOC~~ SOLN
7.0000 [IU] | Freq: Three times a day (TID) | SUBCUTANEOUS | Status: DC
  Administered 2020-07-08 (×2): 7 [IU] via SUBCUTANEOUS

## 2020-07-08 MED ORDER — DEXAMETHASONE SODIUM PHOSPHATE 4 MG/ML IJ SOLN
4.0000 mg | Freq: Three times a day (TID) | INTRAMUSCULAR | Status: DC
  Administered 2020-07-08 – 2020-07-09 (×2): 4 mg via INTRAVENOUS
  Filled 2020-07-08 (×3): qty 1

## 2020-07-08 MED ORDER — INSULIN GLARGINE 100 UNIT/ML ~~LOC~~ SOLN
35.0000 [IU] | Freq: Every day | SUBCUTANEOUS | Status: DC
  Administered 2020-07-08: 35 [IU] via SUBCUTANEOUS
  Filled 2020-07-08 (×2): qty 0.35

## 2020-07-08 NOTE — Progress Notes (Signed)
Daily Progress Note   Patient Name: Tara Aguilar       Date: 07/08/2020 DOB: December 13, 1980  Age: 39 y.o. MRN#: [id number] Attending Physician: Aline August, MD Primary Care Physician: Glenda Chroman, MD Admit Date: 07/04/2020  Reason for Consultation/Follow-up: Establishing goals of care  Subjective: I met today with [name], her close friend, Tara Aguilar, and her sister, Tara Aguilar.  Tara Aguilar continues to be awake and alert and was able to fully participate in conversation.  We discussed potential discharge options for her care moving forward including home with home health, transition to skilled facility for trial of rehab, or possible reevaluation for CIR.  She would like the opportunity to work with physical therapy again prior to determining will be the best venue for her to continue rehab.  While she would still be open to CIR, there are questions about her ability to tolerate intensity of therapy.  She does appear to be more awake and interactive today and she would like the opportunity to see how she does with further therapy as she is feeling less lethargic now that she has been on steroids for a few days.  Her backup plan would likely be to skilled facility unless it is felt that she could probably ambulate independently at home.  There is concern about her safety if she does choose to return home with home health.  We also discussed advance care planning including recommendation for completion of HCPOA and MOST form.  I will place a consult to spiritual care to meet with her tomorrow to discuss completion of healthcare power of attorney paperwork.  I did drop off a copy of advance directives today for them to review.  We reviewed a MOST form and discussed how to develop plan of care to focus on  continuing therapies that would maximize chance of being well enough to return home and limiting therapies not in line with this goal.  I discussed with Tara Aguilar and her family regarding heroic interventions at the end-of-life. There is agreement this would not be in line with prior expressed wishes for a natural death or be likely to lead to getting well enough to go back home. She was in agreement with changing CODE STATUS to DO NOT RESUSCITATE.  While she is agreeable to no attempts at resuscitation in the event  of a natural death, she is otherwise invested in continuation of any therapies that will add more time and quality to her life to allow her to spend time with her son, Tara Aguilar.  We completed MOST form today. DNR, Limited additional interventions, IVF and ABX if indicated, no feeding tube.   Length of Stay: 4  Current Medications: Scheduled Meds:  . chlorhexidine  15 mL Mouth Rinse BID  . Chlorhexidine Gluconate Cloth  6 each Topical Daily  . dexamethasone (DECADRON) injection  4 mg Intravenous Q8H  . insulin aspart  0-9 Units Subcutaneous TID WC  . insulin aspart  7 Units Subcutaneous TID WC  . insulin glargine  35 Units Subcutaneous Daily  . levETIRAcetam  1,500 mg Oral BID  . mouth rinse  15 mL Mouth Rinse q12n4p    Continuous Infusions:   PRN Meds: ondansetron **OR** ondansetron (ZOFRAN) IV  Physical Exam        General: Alert, awake, in no acute distress. Chronically ill appearing Resp: Decreased air movement, scattered crackles in bases Abdomen: Nondistended Ext: No significant edema Skin: Warm and dry Neuro: Grossly intact, nonfocal.  More awake today.  Vital Signs: BP (!) 105/43 (BP Location: Right Arm)   Pulse 90   Temp 98.1 F (36.7 C) (Oral)   Resp 17   Ht 5\' 6"  (1.676 m)   Wt (!) 136.7 kg   SpO2 98%   BMI 48.64 kg/m  SpO2: SpO2: 98 % O2 Device: O2 Device: Nasal Cannula O2 Flow Rate: O2 Flow Rate (L/min): 2 L/min  Intake/output summary:    Intake/Output Summary (Last 24 hours) at 07/08/2020 1642 Last data filed at 07/08/2020 1300 Gross per 24 hour  Intake 240 ml  Output --  Net 240 ml   LBM: Last BM Date: 07/02/20 Baseline Weight: Weight: (!) 136.7 kg Most recent weight: Weight: (!) 136.7 kg       Palliative Assessment/Data:      Patient Active Problem List   Diagnosis Date Noted  . Brain metastasis (HCC) 07/04/2020  . Palliative care by specialist   . Lung cancer (HCC) 03/28/2020  . 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 Squamous cell lung cancer, unspecified laterality (HCC) 04/05/2019  . Goals of care, counseling/discussion 04/05/2019  . 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 Glucose intolerance 03/11/2019  . Obesity, Class III, BMI 40-49.9 (morbid obesity) (HCC) 03/11/2019  . Tobacco use 03/11/2019  . Dental abscess 03/07/2019  . 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 Depression     Palliative Care Assessment & Plan   Patient Profile: 39 y.o. 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  She had been on Decadron following radiation but this was stopped prior to intended stop date and recently restarted at low-dose by radiation oncology.  Following presentation to the hospital, high-dose steroids were reinitiated and her mental status certainly appears to be improved following this increase. Palliative consulted for goals of care.  Recommendations/Plan:  We completed a MOST form today.  Care plan moving forward includes: DNR, limited additional interventions, IV fluid and  antibiotics if indicated, no feeding tube.  Tara Aguilar is  clear that she is not at a point where she wants to consider electing hospice at this time.  While she is open to the idea in the future, her goal at this point is to see how much functional status she can regain while waiting to see how much benefit she gains from reinitiation of high-dose steroids.  She wants to see how she continues to do with continuation of steroids and follow-up with Dr. Mickeal Skinner, radiation oncology, and Dr. Delton Coombes to discuss next steps.  Eily would like to work again with therapy to get their input on best venue for continuing trial of rehab when the time comes to discharge from the hospital.  She would still be open to consideration for CIR if it is thought that she would tolerate the intensity of therapy.  Otherwise consideration will be for skilled facility versus home with home health.  While she would like to be home, there is concern about her ability to safely manage and family would appreciate PT input on next best step if her overall goal is to improve her functional status is much as possible.  She would like for her friend, Tara Aguilar, and 2 of her sisters Tara Aguilar and Diane?)  To act as her Air traffic controller in the event she cannot make her own medical decisions.  Will request spiritual care to follow-up for completion of [facility] POA paperwork on Monday.  Goals of Care and Additional Recommendations:  Limitations on Scope of Treatment: Full Scope Treatment  Code Status: DNR  Prognosis:   Guarded  Discharge Planning:  [place] for rehab with Palliative care service follow-up vs CIR  Care plan was discussed with patient, sister, friend, and Dr. Starla Link  Thank you for allowing the Palliative Medicine Team to assist in the care of this patient.   Time In: 1410 Time Out: 1500 Total Time 50 Prolonged Time Billed No   Greater than 50%  of this time was spent counseling and coordinating  care related to the above assessment and plan.  Micheline Rough, MD  Please contact Palliative Medicine Team phone at [phone] for questions and concerns.

## 2020-07-08 NOTE — Progress Notes (Signed)
Patient ID: Tara Aguilar, female   DOB: [birth year], 39 y.o.   MRN: [id number]  PROGRESS NOTE    Tara Aguilar  ZHY:[id number] DOB: 1981/09/17 DOA: 07/04/2020 PCP: Glenda Chroman, MD   Brief Narrative:  39 y.o. 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 Patient had a CT of the head which showed 2.4 cm mass in the left frontoparietal area with possible hemorrhage and the edema of the left cerebral area with effacement of the left lateral ventricle.  Patient was started on Keppra and Decadron and discussed with patient's oncologist/Dr. Delton Coombes who advised that patient be transferred to [facility] for radiation oncology evaluation.  Assessment & Plan:   Seizure and confusion in a patient with history of known metastasis to brain -Patient has a known history of lung cancer with metastasis to brain and recently completed brain radiation on May 24, 2020 and has been experiencing right upper and lower extremity numbness for the past few weeks and was also placed on Decadron by patient's oncologist.  Presented with seizure like episode of confusion with right-sided weakness. -CT of the head showed 2.4 cm mass in the left frontoparietal area with possible hemorrhage and the edema of the left cerebral area with effacement of the left lateral ventricle. -MRI of the brain showed minimal increase in the size of the larger 2 enhancing lesions, favored to reflect treatment change in the setting of interval radiation treatment; decreased size of the smallest lesion in the high left frontoparietal region; no new enhancing lesions identified. When accounting for  differences in technique, similar associated surrounding edema and mass effect with trace rightward midline shift. No hydrocephalus. -No need for radiation at this time as per radiation oncology.  Secure chatted with neuro oncology/Dr. Mickeal Skinner on 07/05/20 who recommended high doses of IV Decadron, upto total of 24 mg a day while hospitalized and switch to around 4 mg daily once patient is stable and not postictal.  Spoke to neurology/Dr. Rory Percy on phone on 07/05/2020 who recommended EEG.  -Spoke to Dr. Arora/neurology again on 07/06/2020 on phone who stated that EEG did not show any obvious seizure activities.  We will hold off on official neurology consultation -Seizure precautions/aspiration precautions.   -Outpatient follow-up with oncology -PT eval  -Tolerating diet.   -Switched to oral Keppra on 07/07/2020. -Decrease Decadron to 4 mg IV every 8 hours.  Metastatic squamous cell cancer -Being followed up by Dr. Katragadda/oncology at [facility] -Palliative care evaluation for goals of care discussion is appreciated.  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 Increase Lantus to 35 units daily along with CBGs with SSI and NovoLog with meals.  Hyperkalemia -Probably spurious lab result.  Resolved.  Leukocytosis -Probably reactive.  Resolved  Morbid obesity -Outpatient follow-up  Tobacco abuse -Needs to stop smoking  History of left basilic vein thrombosis -Eliquis on hold for now. Will resume Eliquis if okay with neuro oncology  Generalized conditioning -Overall prognosis is very poor. Palliative care following. -PT eval recommended CIR.  CIR evaluated the patient and stated that she is not a CIR candidate.  Consult social work for possible SNF placement.  DVT prophylaxis: SCDs.  Avoiding anticoagulation due to possible hemorrhage in the brain mass Code Status: Full Family Communication: None at bedside Disposition  Plan: Status is: Inpatient  Remains inpatient appropriate because:Inpatient level of care appropriate due to severity of illness.  Currently medically stable for discharge to CIR or SNF   Dispo: The patient is from: Home              Anticipated d/c is to: SNF/CIR              Anticipated d/c date is: 1 day              Patient currently is medically stable to d/c.   Consultants: Radiation oncology.  Discussed with Dr. Mickeal Skinner via secure chat.  Spoke to on-call neurologist/Dr. Rory Percy on phone on 07/05/2020 Palliative care  Procedures: EEG  IMPRESSION: This study is suggestive of cortical dysfunction in left hemisphere likely secondary to underlying structural abnormality, cerebral mets, postictal state.  No seizures or epileptiform discharges were seen throughout the recording.  Antimicrobials: None   Subjective: Patient seen and examined at bedside.  Poor historian.  Does not engage in conversation much.  No worsening headaches.  No overnight fever, vomiting or worsening shortness of breath reported.  No seizures reported as well.   Objective: Vitals:   07/07/20 0609 07/07/20 1001 07/07/20 1314 07/07/20 2106  BP: (!) 127/91 (!) 147/84 133/63 (!) 123/91  Pulse: 81 92 99 98  Resp: 20 20 16 18   Temp: 97.6 F (36.4 C) 98.2 F (36.8 C) 97.7 F (36.5 C) 97.7 F (36.5 C)  TempSrc: Oral Oral Oral Oral  SpO2: 96% 96% 96% 98%  Weight:      Height:        Intake/Output Summary (Last 24 hours) at 07/08/2020 0814 Last data filed at 07/07/2020 0900 Gross per 24 hour  Intake 500 ml  Output --  Net 500 ml   Filed Weights   07/04/20 2157  Weight: (!) 136.7 kg    Examination:  General exam:  Looks chronically ill.  No distress. Respiratory system: Bilateral decreased breath sounds at bases with no wheezing cardiovascular system: Rate controlled, S1-S2 heard Gastrointestinal system: Abdomen is morbidly obese, nondistended, soft and nontender.  Bowel sounds are heard  extremities: No  clubbing.  Mild bilateral lower extremity edema present. Central nervous system: Awake and oriented, still slow to respond to questions.  No focal neurological deficits.  Moves extremities; still slightly weak on the right side Skin: No obvious petechiae/lesions Psychiatry: Currently engages in any conversation.  Extremely flat affect.  Data Reviewed: I have personally reviewed following labs and imaging studies  CBC: Recent Labs  Lab 07/05/20 0000 07/05/20 0249 07/06/20 0255 07/07/20 0552  WBC 12.0* 12.1* 11.7* 10.7*  NEUTROABS 10.5*  --  10.2* 9.3*  HGB 14.5 14.2 14.4 14.8  HCT 44.1 43.0 44.2 45.0  MCV 98.0 97.7 97.6 97.4  PLT 258 258 246 007   Basic Metabolic Panel: Recent Labs  Lab 07/05/20 0000 07/05/20 0249 07/06/20 0255 07/07/20 0552 07/08/20 0558  NA 136 135 137 137 137  K 4.5 4.3 4.9 5.6* 4.5  CL 98 99 99 95* 96*  CO2 27 27 29 30 31   GLUCOSE 250* 253* 314* 289* 297*  BUN 10 12 22* 28* 27*  CREATININE 0.68 0.67 0.77 0.88 0.76  CALCIUM 9.3 9.1 9.4 9.4 9.6  MG  --   --  2.2 2.3 1.9   GFR: Estimated Creatinine Clearance: 131.9 mL/min (  by C-G formula based on SCr of 0.76 mg/dL). Liver Function Tests: Recent Labs  Lab 07/05/20 0000 07/06/20 0255 07/07/20 0552  AST 27 18 45*  ALT 61* 52* 48*  ALKPHOS 93 96 91  BILITOT 1.0 0.4 1.2  PROT 6.8 7.1 7.0  ALBUMIN 3.6 3.6 3.5   No results for input(s): LIPASE, AMYLASE in the last 168 hours. No results for input(s): AMMONIA in the last 168 hours. Coagulation Profile: No results for input(s): INR, PROTIME in the last 168 hours. Cardiac Enzymes: No results for input(s): CKTOTAL, CKMB, CKMBINDEX, TROPONINI in the last 168 hours. BNP (last 3 results) No results for input(s): PROBNP in the last 8760 hours. HbA1C: No results for input(s): HGBA1C in the last 72 hours. CBG: Recent Labs  Lab 07/07/20 0929 07/07/20 1141 07/07/20 1652 07/07/20 2058 07/08/20 0753  GLUCAP 356* 339* 389* 337* 284*   Lipid  Profile: No results for input(s): CHOL, HDL, LDLCALC, TRIG, CHOLHDL, LDLDIRECT in the last 72 hours. Thyroid Function Tests: No results for input(s): TSH, T4TOTAL, FREET4, T3FREE, THYROIDAB in the last 72 hours. Anemia Panel: No results for input(s): VITAMINB12, FOLATE, FERRITIN, TIBC, IRON, RETICCTPCT in the last 72 hours. Sepsis Labs: Recent Labs  Lab 07/05/20 0000 07/05/20 0249  LATICACIDVEN 1.2 1.1    Recent Results (from the past 240 hour(s))  MRSA PCR Screening     Status: None   Collection Time: 07/04/20  9:59 PM   Specimen: Nasal Mucosa; Nasopharyngeal  Result Value Ref Range Status   MRSA by PCR NEGATIVE NEGATIVE Final    Comment:        The GeneXpert MRSA Assay (FDA approved for NASAL specimens only), is one component of a comprehensive MRSA colonization surveillance program. It is not intended to diagnose MRSA infection nor to guide or monitor treatment for MRSA infections. Performed at [facility], [place] [address] [place], [place] [address]   SARS Coronavirus 2 by RT PCR (hospital order, performed in [facility] hospital lab) Nasopharyngeal Nasopharyngeal Swab     Status: None   Collection Time: 07/05/20 12:17 AM   Specimen: Nasopharyngeal Swab  Result Value Ref Range Status   SARS Coronavirus 2 NEGATIVE NEGATIVE Final    Comment: (NOTE) SARS-CoV-2 target nucleic acids are NOT DETECTED.  The SARS-CoV-2 RNA is generally detectable in upper and lower respiratory specimens during the acute phase of infection. The lowest concentration of SARS-CoV-2 viral copies this assay can detect is 250 copies / mL. A negative result does not preclude SARS-CoV-2 infection and should not be used as the sole basis for treatment or other patient management decisions.  A negative result may occur with improper specimen collection / handling, submission of specimen other than nasopharyngeal swab, presence of viral mutation(s) within the areas targeted by this  assay, and inadequate number of viral copies (<250 copies / mL). A negative result must be combined with clinical observations, patient history, and epidemiological information.  Fact Sheet for Patients:   StrictlyIdeas.no  Fact Sheet for Healthcare Providers: BankingDealers.co.za  This test is not yet approved or  cleared by the [place] FDA and has been authorized for detection and/or diagnosis of SARS-CoV-2 by FDA under an Emergency Use Authorization (EUA).  This EUA will remain in effect (meaning this test can be used) for the duration of the COVID-19 declaration under Section 564(b)(1) of the Act, 21 U.S.C. section 360bbb-3(b)(1), unless the authorization is terminated or revoked sooner.  Performed at [facility], [place]  [place],  [id number]          Radiology Studies: EEG  Result Date: 07/06/2020 Lora Havens, MD     07/06/2020 11:28 AM Patient Name: Tara Aguilar MRN: [id number] Epilepsy Attending: Lora Havens Referring Physician/Provider: Dr Daryel November Date: 07/05/2020 Duration: 24.39 minutes Patient history: 39 year old female with 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  EEG to evaluate for seizures. Level of alertness: Awake AEDs during EEG study: Keppra Technical aspects: This EEG study was done with scalp electrodes positioned according to the 10-20 International system of electrode placement. Electrical activity was acquired at a sampling rate of 500Hz  and reviewed with a high frequency filter of 70Hz  and a low frequency filter of 1Hz . EEG data were recorded continuously and digitally stored. Description: No clear posterior dominant rhythm was seen.  EEG showed condyle generalized more for 3 to 6 Hz theta-delta slowing.  Intermittent rhythmic 2 to 3 Hz delta slowing was also seen in left  hemisphere.  Hyperventilation and photic stimulation were not performed.   ABNORMALITY -Continuous slow, generalized - Intermittent rhythmic delta slow, left hemisphere IMPRESSION: This study is suggestive of cortical dysfunction in left hemisphere likely secondary to underlying structural abnormality, cerebral mets, postictal state.  No seizures or epileptiform discharges were seen throughout the recording. Priyanka [name]        Scheduled Meds: . chlorhexidine  15 mL Mouth Rinse BID  . Chlorhexidine Gluconate Cloth  6 each Topical Daily  . dexamethasone (DECADRON) injection  4 mg Intravenous Q6H  . insulin aspart  0-9 Units Subcutaneous TID WC  . insulin aspart  4 Units Subcutaneous TID WC  . insulin glargine  30 Units Subcutaneous Daily  . levETIRAcetam  1,500 mg Oral BID  . mouth rinse  15 mL Mouth Rinse q12n4p   Continuous Infusions:         Aline August, MD [facility] 07/08/2020, 8:14 AM

## 2020-07-08 NOTE — Progress Notes (Addendum)
Patient's right upper extremity has some edema, more specifically the right hand. During my morning assessment of the patient, the patient's right hand did not appear to be swollen like it is now. I have encouraged the patient to prop right upper extremity with a pillow to help with the edema. MD was notified and gave new orders. The patient's IV was leaking. IV team was consulted. RN assisted patient back to bed and IV was removed. The new IV was placed by IV team. The patient is on room air O2 sat around 90 to 91%. Patient is currently not on any oxygen at the time. Patient is alert and oriented and is comfortable sitting in the chair.

## 2020-07-08 NOTE — TOC Initial Note (Signed)
Transition of Care [facility]) - Initial/Assessment Note    Patient Details  Name: Tara Aguilar MRN: [id number] Date of Birth: 05-Feb-1981  [facility] CM/SW Contact:    Greg Cutter, LCSW Phone Number: 07/08/2020, 1:42 PM  Clinical Narrative:   LCSW spoke with patient's sister on 07/08/20. LCSW asked for permission to start SNF placement at this time. Sister reports that they are unsure if [facility] or SNF would be the best option for patient at this time. Sister wishes to research options and discuss with family further before making a decision. Patient and patient's sister have a meeting with palliative care this afternoon to discuss this matter (which discharge options would be best for patient at this time.) Family state that they would like to wait for this meeting first before making a decision.         Expected Discharge Plan: Home w Hospice Care Barriers to Discharge: Continued Medical Work up   Patient Goals and CMS Choice Patient states their goals for this hospitalization and ongoing recovery are:: to go back home CMS Medicare.gov Compare Post Acute Care list provided to:: Patient    Expected Discharge Plan and Services Expected Discharge Plan: [place]   Discharge Planning Services: CM Consult   Living arrangements for the past 2 months: Single Family Home                    Prior Living Arrangements/Services Living arrangements for the past 2 months: Single Family Home Lives with:: Self Patient language and need for interpreter reviewed:: Yes Do you feel safe going back to the place where you live?: Yes      Need for Family Participation in Patient Care: Yes (Comment) Care giver support system in place?: Yes (comment)   Criminal Activity/Legal Involvement Pertinent to Current Situation/Hospitalization: No - Comment as needed  Activities of Daily Living Home Assistive Devices/Equipment: Cane (specify quad or straight) ADL Screening (condition at time  of admission) Patient's cognitive ability adequate to safely complete daily activities?: Yes Is the patient deaf or have difficulty hearing?: No Does the patient have difficulty seeing, even when wearing glasses/contacts?: No Does the patient have difficulty concentrating, remembering, or making decisions?: No Patient able to express need for assistance with ADLs?: Yes Does the patient have difficulty dressing or bathing?: Yes Independently performs ADLs?: No Communication: Independent Dressing (OT): Needs assistance Is this a change from baseline?: Pre-admission baseline Grooming: Needs assistance Is this a change from baseline?: Pre-admission baseline Feeding: Needs assistance Is this a change from baseline?: Pre-admission baseline Bathing: Needs assistance Is this a change from baseline?: Pre-admission baseline Toileting: Needs assistance Is this a change from baseline?: Pre-admission baseline In/Out Bed: Needs assistance Is this a change from baseline?: Pre-admission baseline Walks in Home: Needs assistance Is this a change from baseline?: Pre-admission baseline Does the patient have difficulty walking or climbing stairs?: Yes Weakness of Legs: Both Weakness of Arms/Hands: Both  Emotional Assessment Appearance:: Appears stated age Attitude/Demeanor/Rapport: Engaged Affect (typically observed): Calm Orientation: : Oriented to Place, Oriented to Self, Oriented to  Time, Oriented to Situation Alcohol / Substance Use: Not Applicable Psych Involvement: No (comment)  Admission diagnosis:  Brain metastasis ([place]) [C79.31] Patient Active Problem List   Diagnosis Date Noted  . Brain metastasis ([place]) 07/04/2020  . Palliative care by specialist   . Lung cancer ([place]) 03/28/2020  . 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 Squamous cell lung  cancer, unspecified laterality ([place]) 04/05/2019  . Goals of care, counseling/discussion  04/05/2019  . 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 Glucose intolerance 03/11/2019  . Obesity, Class III, BMI 40-49.9 (morbid obesity) ([place]) 03/11/2019  . Tobacco use 03/11/2019  . Dental abscess 03/07/2019  . 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 Depression    PCP:  Glenda Chroman, MD Pharmacy:   [place], [place] - [place] [address] [place] [address] [place] [address] Phone: [phone] Fax: [phone]  [place], [place] [address] Phone: [phone] Fax: [place], [address] [place] [address] Phone: [phone] Fax: [phone]   Readmission Risk Interventions No flowsheet data found.   Eula Fried, BSW, MSW, LCSW [email or web address]

## 2020-07-09 ENCOUNTER — Inpatient Hospital Stay (HOSPITAL_COMMUNITY): Payer: BC Managed Care – PPO

## 2020-07-09 DIAGNOSIS — M7989 Other specified soft tissue disorders: Secondary | ICD-10-CM

## 2020-07-09 LAB — GLUCOSE, CAPILLARY
Glucose-Capillary: 264 mg/dL — ABNORMAL HIGH (ref 70–99)
Glucose-Capillary: 385 mg/dL — ABNORMAL HIGH (ref 70–99)
Glucose-Capillary: 203 mg/dL — ABNORMAL HIGH (ref 70–99)
Glucose-Capillary: 279 mg/dL — ABNORMAL HIGH (ref 70–99)

## 2020-07-09 MED ORDER — DEXAMETHASONE SODIUM PHOSPHATE 4 MG/ML IJ SOLN
4.0000 mg | Freq: Two times a day (BID) | INTRAMUSCULAR | Status: DC
  Administered 2020-07-09 – 2020-07-11 (×4): 4 mg via INTRAVENOUS
  Filled 2020-07-09 (×4): qty 1

## 2020-07-09 MED ORDER — INFLUENZA VAC SPLIT QUAD 0.5 ML IM SUSY
0.5000 mL | PREFILLED_SYRINGE | INTRAMUSCULAR | Status: AC
  Administered 2020-07-10: 0.5 mL via INTRAMUSCULAR
  Filled 2020-07-09: qty 0.5

## 2020-07-09 MED ORDER — INSULIN ASPART 100 UNIT/ML ~~LOC~~ SOLN
10.0000 [IU] | Freq: Three times a day (TID) | SUBCUTANEOUS | Status: DC
  Administered 2020-07-09 – 2020-07-12 (×11): 10 [IU] via SUBCUTANEOUS

## 2020-07-09 MED ORDER — INSULIN GLARGINE 100 UNIT/ML ~~LOC~~ SOLN
40.0000 [IU] | Freq: Every day | SUBCUTANEOUS | Status: DC
  Administered 2020-07-09: 40 [IU] via SUBCUTANEOUS
  Filled 2020-07-09 (×2): qty 0.4

## 2020-07-09 NOTE — Plan of Care (Signed)
  Problem: Education: Goal: Knowledge of General Education information will improve Description: Including pain rating scale, medication(s)/side effects and non-pharmacologic comfort measures Outcome: Completed/Met   Problem: Activity: Goal: Risk for activity intolerance will decrease Outcome: Completed/Met   Problem: Coping: Goal: Level of anxiety will decrease Outcome: Completed/Met   Problem: Pain Managment: Goal: General experience of comfort will improve Outcome: Completed/Met

## 2020-07-09 NOTE — Progress Notes (Signed)
Physical Therapy Treatment Patient Details Name: Tara Aguilar MRN: [id number] DOB: [birth year] Today's Date: 07/09/2020    History of Present Illness 39 y.o. 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 Patient had a CT of the head which showed 2.4 cm mass in the left frontoparietal area with possible hemorrhage and the edema of the left cerebral area with effacement of the left lateral ventricle    PT Comments    Pt OOB in recliner on RA sats 95% with sister(lives in [place])  in room. General Comments: AxO x 3 very pleasant.  Feeling better.  Good historian and recall. Pt lives home with her 42 year old son.    Assisted with amb in hallway.  General Gait Details: used a Bariatric 4 WW this session.  Assisted with amb an increased distance in hallway while monitoring HR and sats.  Avg HR 84 and sats 92% with 2/4 dyspnea noted requiring  one seated rest break after 40 feet.  BP 135/89.  Amb another 40 feet back to room.  Again, mild dyspnea but def need to rest.  Pt feeling better but appears deconditioned and not functioning at prior mobility level as far as endurance/duration/activity level.   Pt would be appropriate for Rehab at CIR to regain her prior level of mobility.  Follow Up Recommendations  CIR     Equipment Recommendations  Rolling walker with 5" wheels    Recommendations for Other Services       Precautions / Restrictions Precautions Precautions: Fall Precaution Comments: monitor sats Restrictions Weight Bearing Restrictions: No    Mobility  Bed Mobility               General bed mobility comments: OOB in recliner on  arrival  Transfers Overall transfer level: Needs assistance Equipment used: 4-wheeled walker Transfers: Sit to/from Stand;Stand Pivot Transfers Sit to Stand: Supervision Stand pivot transfers: Supervision;Min guard       General transfer comment: pt self able to rise and lower with minimal use of hands  Ambulation/Gait Ambulation/Gait assistance: Supervision;Min guard Gait Distance (Feet): 80 Feet (40 feet x 2) Assistive device: 4-wheeled walker Gait Pattern/deviations: Step-through pattern Gait velocity: decreased   General Gait Details: used a Bariatric 4 WW this session.  Assisted with amb an increased distance in hallway while monitoring HR and sats.  Avg HR 84 and sats 92% with 2/4 dyspnea noted requiring  one seated rest break after 40 feet.  BP 135/89.   Stairs             Wheelchair Mobility    Modified Rankin (Stroke Patients Only)       Balance                                            Cognition Arousal/Alertness: Awake/alert Behavior During Therapy: WFL for tasks assessed/performed Overall Cognitive Status: Within Functional Limits for tasks assessed  General Comments: AxO x 3 very pleasant.  Feeling better.  Good historian and recall.      Exercises      General Comments        Pertinent Vitals/Pain Pain Assessment: No/denies pain    Home Living                      Prior Function            PT Goals (current goals can now be found in the care plan section) Progress towards PT goals: Progressing toward goals    Frequency    Min 3X/week      PT Plan Current plan remains appropriate    Co-evaluation              AM-PAC PT "6 Clicks" Mobility   Outcome Measure  Help needed turning from your back to your side while in a flat bed without using bedrails?: A Little Help needed moving from lying on your back to sitting on the side of a flat bed  without using bedrails?: A Little Help needed moving to and from a bed to a chair (including a wheelchair)?: A Little Help needed standing up from a chair using your arms (e.g., wheelchair or bedside chair)?: A Little Help needed to walk in hospital room?: A Little Help needed climbing 3-5 steps with a railing? : A Lot 6 Click Score: 17    End of Session Equipment Utilized During Treatment: Gait belt Activity Tolerance: Patient tolerated treatment well Patient left: in chair Nurse Communication: Mobility status PT Visit Diagnosis: Other symptoms and signs involving the nervous system (R29.898);Difficulty in walking, not elsewhere classified (R26.2)     Time: 5003-7048 PT Time Calculation (min) (ACUTE ONLY): 26 min  Charges:  $Gait Training: 8-22 mins $Therapeutic Activity: 8-22 mins                     Rica Koyanagi  PTA Acute  Rehabilitation Services Pager      [phone] Office      [phone]

## 2020-07-09 NOTE — Progress Notes (Signed)
Patient is alert and oriented and sitting in the chair comfortably next to bed watching entertainment on patient's phone. Patient has call bell within reach and grippy socks on. Patient has been able to go to the bathroom without assistance, but has been reminded to call for help when going to the bathroom. Patient was wearing oxygen at 2 liters upon assessment, but takes it off at times. When assessing vitals, patient's O2 sats were ranging from about 93 to 95%.

## 2020-07-09 NOTE — TOC Progression Note (Addendum)
Transition of Care [facility]) - Progression Note    Patient Details  Name: Tara Aguilar MRN: [id number] Date of Birth: Jul 08, 1981  Transition of Care [facility]) CM/SW Contact  Purcell Mouton, RN Phone Number: 07/09/2020, 4:56 PM  Clinical Narrative:     Damaris Schooner with Alfonso Ramus concerning no beds at [facility] and that pt was faxed to [place]. Also asked [place] if she wanted pt to be faxed to other SNF/Rehabs if [facility] could not take pt. Luj[place]ana agreed to pt going to a SNF for Rehab. Alfonso Ramus states that she and her husband are moving into pt's home to help care for pt.  Pt has BCBS, will need OT eval.   Expected Discharge Plan: Home w Hospice Care Barriers to Discharge: Continued Medical Work up  Expected Discharge Plan and Services Expected Discharge Plan: [place]   Discharge Planning Services: CM Consult   Living arrangements for the past 2 months: Single Family Home                                       Social Determinants of Health (SDOH) Interventions    Readmission Risk Interventions No flowsheet data found.

## 2020-07-09 NOTE — NC FL2 (Signed)
[place] LEVEL OF CARE SCREENING TOOL     IDENTIFICATION  Patient Name: Tara Aguilar Birthdate: 20-Dec-1980 Sex: female Admission Date (Current Location): 07/04/2020  [facility] and [place] Number:  Herbalist and Address:  [facility],  [place], [place]      Provider Number: [id number]  Attending Physician Name and Address:  Aline August, MD  Relative Name and Phone Number:  Maryann Alar  [phone] friend    Current Level of Care: Hospital Recommended Level of Care: [place] Prior Approval Number:    Date Approved/Denied:   PASRR Number: [id number]  Discharge Plan: SNF    Current Diagnoses: Patient Active Problem List   Diagnosis Date Noted  . Brain metastasis ([place]) 07/04/2020  . Palliative care by specialist   . Lung cancer ([place]) 03/28/2020  . Secondary and unspecified malignant neoplasm of intrathoracic lymph nodes ([place]) 06/09/2019  . Squamous cell lung cancer, unspecified laterality ([place]) 04/05/2019  . Goals of care, counseling/discussion 04/05/2019  . 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 Glucose intolerance 03/11/2019  . Obesity, Class III, BMI 40-49.9 (morbid obesity) ([place]) 03/11/2019  . Tobacco use 03/11/2019  . Dental abscess 03/07/2019  . 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 Depression     Orientation RESPIRATION BLADDER Height & Weight     Self, Time, Situation, Place  O2 (2L O2 [place]) Continent Weight: (!) 136.7 kg Height:  5\' 6"  (167.6 cm)  BEHAVIORAL SYMPTOMS/MOOD NEUROLOGICAL BOWEL NUTRITION STATUS      Continent Diet (Carb Mod)  AMBULATORY STATUS COMMUNICATION OF NEEDS Skin   Extensive Assist   Normal                       Personal Care Assistance Level of Assistance  Bathing, Feeding, Dressing  Bathing Assistance: Limited assistance Feeding assistance: Independent Dressing Assistance: Limited assistance     Functional Limitations Info  Sight, Hearing, Speech Sight Info: Adequate Hearing Info: Adequate Speech Info: Adequate    SPECIAL CARE FACTORS FREQUENCY  PT (By licensed PT), OT (By licensed OT)     PT Frequency: 5x week OT Frequency: 5x week            Contractures Contractures Info: Not present    Additional Factors Info  Code Status, Allergies Code Status Info: DNR Allergies Info: No Known Allergies           Current Medications (07/09/2020):  This is the current hospital active medication list Current Facility-Administered Medications  Medication Dose Route Frequency Provider Last Rate Last Admin  . chlorhexidine (PERIDEX) 0.12 % solution 15 mL  15 mL Mouth Rinse BID Rise Patience, MD   15 mL at 07/09/20 0937  . dexamethasone (DECADRON) injection 4 mg  4 mg Intravenous Q12H Aline August, MD      . Derrill Memo ON 07/10/2020] influenza vac split quadrivalent PF (FLUARIX) injection 0.5 mL  0.5 mL Intramuscular Tomorrow-1000 Alekh, Kshitiz, MD      . insulin aspart (novoLOG) injection 0-9 Units  0-9 Units Subcutaneous TID WC Rise Patience, MD   3 Units at 07/09/20 1718  . insulin aspart (novoLOG) injection 10 Units  10 Units Subcutaneous TID WC Aline August, MD   10 Units at 07/09/20 1719  . insulin glargine (LANTUS) injection 40 Units  40  Units Subcutaneous Daily Aline August, MD   40 Units at 07/09/20 0937  . levETIRAcetam (KEPPRA) tablet 1,500 mg  1,500 mg Oral BID Aline August, MD   1,500 mg at 07/09/20 0937  . MEDLINE mouth rinse  15 mL Mouth Rinse q12n4p Rise Patience, MD   15 mL at 07/09/20 1719  . ondansetron (ZOFRAN) tablet 4 mg  4 mg Oral Q6H PRN Rise Patience, MD       Or  . ondansetron [facility]) injection 4 mg  4 mg Intravenous Q6H PRN Rise Patience, MD         Discharge Medications: Please see discharge  summary for a list of discharge medications.  Relevant Imaging Results:  Relevant Lab Results:   Additional Information QP#[id number]  Purcell Mouton, RN

## 2020-07-09 NOTE — Progress Notes (Signed)
Daily Progress Note   Patient Name: Tara Aguilar       Date: 07/09/2020 DOB: 28-Sep-1981  Age: 39 y.o. MRN#: [id number] Attending Physician: Aline August, MD Primary Care Physician: Glenda Chroman, MD Admit Date: 07/04/2020  Reason for Consultation/Follow-up: Establishing goals of care  Subjective: I met today with Tara Aguilar.  Tara Aguilar continues to be awake and alert and was able to fully participate in conversation.  She was sitting in bedside chair in no distress.  I discussed with her plan for reevaluation by PT for further input on most appropriate venue for trial of rehab on discharge.  I was also able to discuss with the therapist who is going to be working with her today to explain her situation and consideration for which care venue would be most appropriate for her to continue with rehab on discharge.  Appreciate PT expertise greatly.  Length of Stay: 5  Current Medications: Scheduled Meds:  . chlorhexidine  15 mL Mouth Rinse BID  . dexamethasone (DECADRON) injection  4 mg Intravenous Q12H  . insulin aspart  0-9 Units Subcutaneous TID WC  . insulin aspart  10 Units Subcutaneous TID WC  . insulin glargine  40 Units Subcutaneous Daily  . levETIRAcetam  1,500 mg Oral BID  . mouth rinse  15 mL Mouth Rinse q12n4p    Continuous Infusions:   PRN Meds: ondansetron **OR** ondansetron (ZOFRAN) IV  Physical Exam        General: Alert, awake, in no acute distress. Chronically ill appearing Resp: Decreased air movement, scattered crackles in bases Abdomen: Nondistended Ext: No significant edema Skin: Warm and dry Neuro: Grossly intact, nonfocal.  More awake today.  Vital Signs: BP 113/82 (BP Location: Right Arm)   Pulse 88   Temp (!) 97.5 F (36.4 C) (Oral)   Resp 17   Ht 5'  6" (1.676 m)   Wt (!) 136.7 kg   SpO2 95%   BMI 48.64 kg/m  SpO2: SpO2: 95 % O2 Device: O2 Device: Room Air O2 Flow Rate: O2 Flow Rate (L/min): 2 L/min  Intake/output summary:   Intake/Output Summary (Last 24 hours) at 07/09/2020 1137 Last data filed at 07/08/2020 1300 Gross per 24 hour  Intake 240 ml  Output --  Net 240 ml   LBM: Last BM Date: 07/02/20 Baseline  Weight: Weight: (!) 136.7 kg Most recent weight: Weight: (!) 136.7 kg       Palliative Assessment/Data:      Patient Active Problem List   Diagnosis Date Noted  . Brain metastasis ([place]) 07/04/2020  . Palliative care by specialist   . Lung cancer ([place]) 03/28/2020  . Secondary and unspecified malignant neoplasm of intrathoracic lymph nodes ([place]) 06/09/2019  . Squamous cell lung cancer, unspecified laterality ([place]) 04/05/2019  . Goals of care, counseling/discussion 04/05/2019  . 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 Glucose intolerance 03/11/2019  . Obesity, Class III, BMI 40-49.9 (morbid obesity) ([place]) 03/11/2019  . Tobacco use 03/11/2019  . Dental abscess 03/07/2019  . 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 Depression     Palliative Care Assessment & Plan   Patient Profile: 39 y.o. 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  She had been on Decadron following radiation but this was stopped prior to intended stop date and recently restarted at low-dose by radiation oncology.  Following presentation to the hospital, high-dose steroids were reinitiated and her mental status certainly appears to be improved following this increase.  Palliative consulted for goals of care.  Recommendations/Plan:  We completed a MOST form this admission.  Care plan moving forward includes: DNR, limited additional interventions, IV fluid and antibiotics if indicated, no feeding tube.  Tara Aguilar is clear that she is not at a point where she wants to consider electing hospice at this time.  While she is open to the idea in the future, her goal at this point is to see how much functional status she can regain while waiting to see how much benefit she gains from reinitiation of high-dose steroids.  She wants to see how she continues to do with continuation of steroids and follow-up with Dr. Mickeal Skinner, radiation oncology, and Dr. Delton Coombes to discuss next steps.  Appreciate PT input on most appropriate avenue for her to pursue rehab on discharge.  She would still be open to consideration for CIR if her stamina and functional status have improved enough with continuation of steroids that it is thought that she would be able to tolerate the intensity of the therapy.  If this is not a good fit, she would be considering between SNF vs home health.  She would like for her friend, Audie Box, and 2 of her sisters Donia Guiles and Diane?)  To act as her Air traffic controller in the event she cannot make her own medical decisions.  Will request spiritual care to follow-up for completion of [facility] POA paperwork on Monday.  Goals of Care and Additional Recommendations:  Limitations on Scope of Treatment: Full Scope Treatment  Code Status: DNR  Prognosis:   Guarded  Discharge Planning:  Likely SNF versus home health  Care plan was discussed with patient, PT, and Dr. Starla Link  Thank you for allowing the Palliative Medicine Team to assist in the care of this patient.   Time In: 0945 Time Out: 1010 Total Time 25 Prolonged Time Billed No   Greater than 50%  of this time was spent counseling and coordinating care related to the above assessment and plan.  Tara Aguilar  Domingo Cocking, MD  Please contact Palliative Medicine Team phone at [phone] for questions and concerns.

## 2020-07-09 NOTE — TOC Progression Note (Signed)
Transition of Care [facility]) - Progression Note    Patient Details  Name: Tara Aguilar MRN: [id number] Date of Birth: [birth year]  Transition of Care [facility]) CM/SW Contact  Tara Mouton, RN Phone Number: 07/09/2020, 4:15 PM  Clinical Narrative:     Spoke with pt's sister Tara Aguilar at nursing station early this morning, who states that she expect pt to go to CIR. A call to CIR was made, left VM. A return can was made to CIR, there are no beds at present time. Tara Aguilar was called who is on her way back to [place], however she asked that [place] a friend be called who is local to assist in placement. Asked Tara Aguilar that if no beds are available if she agreed with pt going to a SNF for rehab. Pt faxed to [place].   Expected Discharge Plan: Home w Hospice Care Barriers to Discharge: Continued Medical Work up  Expected Discharge Plan and Services Expected Discharge Plan: [place]   Discharge Planning Services: CM Consult   Living arrangements for the past 2 months: Single Family Home                                       Social Determinants of Health (SDOH) Interventions    Readmission Risk Interventions No flowsheet data found.

## 2020-07-09 NOTE — Progress Notes (Signed)
Patient ID: Tara Aguilar, female   DOB: Mar 18, 1981, 39 y.o.   MRN: [id number]  PROGRESS NOTE    Tara Aguilar  NFA:[id number] DOB: Feb 28, 1981 DOA: 07/04/2020 PCP: Glenda Chroman, MD   Brief Narrative:  39 y.o. 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 Patient had a CT of the head which showed 2.4 cm mass in the left frontoparietal area with possible hemorrhage and the edema of the left cerebral area with effacement of the left lateral ventricle.  Patient was started on Keppra and Decadron and discussed with patient's oncologist/Dr. Delton Coombes who advised that patient be transferred to [facility] for radiation oncology evaluation.  Assessment & Plan:   Seizure and confusion in a patient with history of known metastasis to brain -Patient has a known history of lung cancer with metastasis to brain and recently completed brain radiation on May 24, 2020 and has been experiencing right upper and lower extremity numbness for the past few weeks and was also placed on Decadron by patient's oncologist.  Presented with seizure like episode of confusion with right-sided weakness. -CT of the head showed 2.4 cm mass in the left frontoparietal area with possible hemorrhage and the edema of the left cerebral area with effacement of the left lateral ventricle. -MRI of the brain showed minimal increase in the size of the larger 2 enhancing lesions, favored to reflect treatment change in the setting of interval radiation treatment; decreased size of the smallest lesion in the high left frontoparietal region; no new enhancing lesions identified. When accounting for  differences in technique, similar associated surrounding edema and mass effect with trace rightward midline shift. No hydrocephalus. -No need for radiation at this time as per radiation oncology.  Secure chatted with neuro oncology/Dr. Mickeal Skinner on 07/05/20 who recommended high doses of IV Decadron, upto total of 24 mg a day while hospitalized and switch to around 4 mg daily once patient is stable and not postictal.  Spoke to neurology/Dr. Rory Percy on phone on 07/05/2020 who recommended EEG.  -Spoke to Dr. Arora/neurology again on 07/06/2020 on phone who stated that EEG did not show any obvious seizure activities.  We will hold off on official neurology consultation -Seizure precautions/aspiration precautions.   -Outpatient follow-up with oncology -Tolerating diet.   -Switched to oral Keppra on 07/07/2020.  No seizures since admission. -Decrease Decadron to 4 mg IV every 12hours.  Metastatic squamous cell cancer -Being followed up by Dr. Katragadda/oncology at [place] care follow-up for goals of care discussion is appreciated.  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 Increase Lantus to 40 units daily along with CBGs with SSI and NovoLog with meals.  Hyperkalemia -Probably spurious lab result.  Resolved.  Leukocytosis -Probably reactive.  Resolved  Morbid obesity -Outpatient follow-up  Tobacco abuse -Needs to stop smoking  History of left basilic vein thrombosis -Eliquis on hold for now. Will resume Eliquis if okay with neuro oncology  Generalized conditioning -Overall prognosis is very poor. Palliative care following. -PT eval recommended CIR.  CIR evaluated the patient and stated that she is not a CIR candidate.  Consult social work for possible SNF  placement.  Will ask PT to reevaluate.   DVT prophylaxis: SCDs.  Avoiding anticoagulation due to possible hemorrhage in the brain mass Code Status: Full Family  Communication: None at bedside Disposition Plan: Status is: Inpatient  Remains inpatient appropriate because:Inpatient level of care appropriate due to severity of illness.  Currently medically stable for discharge to CIR or SNF   Dispo: The patient is from: Home              Anticipated d/c is to: SNF/CIR              Anticipated d/c date is: 1 day              Patient currently is medically stable to d/c.   Consultants: Radiation oncology.  Discussed with Dr. Mickeal Skinner via secure chat.  Spoke to on-call neurologist/Dr. Rory Percy on phone on 07/05/2020 Palliative care  Procedures: EEG  IMPRESSION: This study is suggestive of cortical dysfunction in left hemisphere likely secondary to underlying structural abnormality, cerebral mets, postictal state.  No seizures or epileptiform discharges were seen throughout the recording.  Antimicrobials: None   Subjective: Patient seen and examined at bedside.  Poor historian.  Reports worsening headache or any seizure.  No overnight fever or vomiting reported.   Objective: Vitals:   07/08/20 1403 07/08/20 1742 07/08/20 2147 07/09/20 0534  BP: (!) 105/43  116/80 123/89  Pulse: 90 87 89 85  Resp: 17 18 18 20   Temp: 98.1 F (36.7 C)  (!) 97.5 F (36.4 C) 97.9 F (36.6 C)  TempSrc: Oral  Oral Oral  SpO2: 98% 91% 91% 98%  Weight:      Height:        Intake/Output Summary (Last 24 hours) at 07/09/2020 0747 Last data filed at 07/08/2020 1300 Gross per 24 hour  Intake 240 ml  Output -  Net 240 ml   Filed Weights   07/04/20 2157  Weight: (!) 136.7 kg    Examination:  General exam: No acute distress.  Chronically ill looking. Respiratory system: Bilateral decreased breath sounds at bases with some scattered crackles  cardiovascular system: S1-S2 heard, rate controlled Gastrointestinal system: Abdomen is morbidly obese, nondistended, soft and nontender.  Normal bowel sounds heard  extremities: Mild bilateral lower extremity edema.  Right  upper extremity edema present.  No cyanosis. Central nervous system: Alert and awake.  No focal neurological deficits.  Moving extremities  Skin: No obvious ecchymosis/rash Psychiatry:  Flat affect  Data Reviewed: I have personally reviewed following labs and imaging studies  CBC: Recent Labs  Lab 07/05/20 0000 07/05/20 0249 07/06/20 0255 07/07/20 0552  WBC 12.0* 12.1* 11.7* 10.7*  NEUTROABS 10.5*  --  10.2* 9.3*  HGB 14.5 14.2 14.4 14.8  HCT 44.1 43.0 44.2 45.0  MCV 98.0 97.7 97.6 97.4  PLT 258 258 246 932   Basic Metabolic Panel: Recent Labs  Lab 07/05/20 0000 07/05/20 0249 07/06/20 0255 07/07/20 0552 07/08/20 0558  NA 136 135 137 137 137  K 4.5 4.3 4.9 5.6* 4.5  CL 98 99 99 95* 96*  CO2 27 27 29 30 31   GLUCOSE 250* 253* 314* 289* 297*  BUN 10 12 22* 28* 27*  CREATININE 0.68 0.67 0.77 0.88 0.76  CALCIUM 9.3 9.1 9.4 9.4 9.6  MG  --   --  2.2 2.3 1.9   GFR: Estimated Creatinine Clearance: 131.9 mL/min (by C-G formula based on SCr of 0.76 mg/dL). Liver Function Tests: Recent Labs  Lab 07/05/20 0000 07/06/20 0255  07/07/20 0552  AST 27 18 45*  ALT 61* 52* 48*  ALKPHOS 93 96 91  BILITOT 1.0 0.4 1.2  PROT 6.8 7.1 7.0  ALBUMIN 3.6 3.6 3.5   No results for input(s): LIPASE, AMYLASE in the last 168 hours. No results for input(s): AMMONIA in the last 168 hours. Coagulation Profile: No results for input(s): INR, PROTIME in the last 168 hours. Cardiac Enzymes: No results for input(s): CKTOTAL, CKMB, CKMBINDEX, TROPONINI in the last 168 hours. BNP (last 3 results) No results for input(s): PROBNP in the last 8760 hours. HbA1C: No results for input(s): HGBA1C in the last 72 hours. CBG: Recent Labs  Lab 07/07/20 2058 07/08/20 0753 07/08/20 1154 07/08/20 1640 07/08/20 2143  GLUCAP 337* 284* 347* 325* 340*   Lipid Profile: No results for input(s): CHOL, HDL, LDLCALC, TRIG, CHOLHDL, LDLDIRECT in the last 72 hours. Thyroid Function Tests: No results for  input(s): TSH, T4TOTAL, FREET4, T3FREE, THYROIDAB in the last 72 hours. Anemia Panel: No results for input(s): VITAMINB12, FOLATE, FERRITIN, TIBC, IRON, RETICCTPCT in the last 72 hours. Sepsis Labs: Recent Labs  Lab 07/05/20 0000 07/05/20 0249  LATICACIDVEN 1.2 1.1    Recent Results (from the past 240 hour(s))  MRSA PCR Screening     Status: None   Collection Time: 07/04/20  9:59 PM   Specimen: Nasal Mucosa; Nasopharyngeal  Result Value Ref Range Status   MRSA by PCR NEGATIVE NEGATIVE Final    Comment:        The GeneXpert MRSA Assay (FDA approved for NASAL specimens only), is one component of a comprehensive MRSA colonization surveillance program. It is not intended to diagnose MRSA infection nor to guide or monitor treatment for MRSA infections. Performed at [facility], [place] [address] [place], [place] [address]   SARS Coronavirus 2 by RT PCR (hospital order, performed in [facility] hospital lab) Nasopharyngeal Nasopharyngeal Swab     Status: None   Collection Time: 07/05/20 12:17 AM   Specimen: Nasopharyngeal Swab  Result Value Ref Range Status   SARS Coronavirus 2 NEGATIVE NEGATIVE Final    Comment: (NOTE) SARS-CoV-2 target nucleic acids are NOT DETECTED.  The SARS-CoV-2 RNA is generally detectable in upper and lower respiratory specimens during the acute phase of infection. The lowest concentration of SARS-CoV-2 viral copies this assay can detect is 250 copies / mL. A negative result does not preclude SARS-CoV-2 infection and should not be used as the sole basis for treatment or other patient management decisions.  A negative result may occur with improper specimen collection / handling, submission of specimen other than nasopharyngeal swab, presence of viral mutation(s) within the areas targeted by this assay, and inadequate number of viral copies (<250 copies / mL). A negative result must be combined with clinical observations, patient  history, and epidemiological information.  Fact Sheet for Patients:   StrictlyIdeas.no  Fact Sheet for Healthcare Providers: BankingDealers.co.za  This test is not yet approved or  cleared by the [place] FDA and has been authorized for detection and/or diagnosis of SARS-CoV-2 by FDA under an Emergency Use Authorization (EUA).  This EUA will remain in effect (meaning this test can be used) for the duration of the COVID-19 declaration under Section 564(b)(1) of the Act, 21 U.S.C. section 360bbb-3(b)(1), unless the authorization is terminated or revoked sooner.  Performed at [facility], [place] [address] [place], [place] [address]          Radiology Studies: No results found.  Scheduled Meds: . chlorhexidine  15 mL Mouth Rinse BID  . Chlorhexidine Gluconate Cloth  6 each Topical Daily  . dexamethasone (DECADRON) injection  4 mg Intravenous Q8H  . insulin aspart  0-9 Units Subcutaneous TID WC  . insulin aspart  7 Units Subcutaneous TID WC  . insulin glargine  35 Units Subcutaneous Daily  . levETIRAcetam  1,500 mg Oral BID  . mouth rinse  15 mL Mouth Rinse q12n4p   Continuous Infusions:         Aline August, MD [facility] 07/09/2020, 7:47 AM

## 2020-07-09 NOTE — Progress Notes (Signed)
Right upper extremity venous duplex has been completed. Preliminary results can be found in CV Proc through chart review.   07/09/20 9:36 AM Tara Aguilar RVT

## 2020-07-09 NOTE — Progress Notes (Signed)
Patient already had AD form. Needed instructions   07/09/20 1100  Clinical Encounter Type  Visited With Patient  Visit Type Initial;Other (Comment) (Needed to fill out AD)  Referral From Physician  Consult/Referral To Chaplain   to fill it out.  Went over with patient and friend.  When they are ready to have it notarized they are to have the chaplain paged. Conard Novak, Chaplain

## 2020-07-09 NOTE — Progress Notes (Signed)
Pt alert and aware sitting up in chair. Pt's sister at bedside. The chaplain explained I was there to help complete the AD. I got the witnesses and the notary. The form was completed and copies made and the original given to pt and one for the chart.

## 2020-07-09 NOTE — Progress Notes (Signed)
Inpatient Rehabilitation Admissions Coordinator  Inpatient rehab consult received. Received call from [facility]. We currently do not have bed available for this patient to admit. Noted good progression with therapy and now supervision to min guard assist 80 feet . Other rehab venue options need to be explored at this time.  Danne Baxter, RN, MSN Rehab Admissions Coordinator [phone] 07/09/2020 3:43 PM

## 2020-07-10 LAB — GLUCOSE, CAPILLARY
Glucose-Capillary: 236 mg/dL — ABNORMAL HIGH (ref 70–99)
Glucose-Capillary: 272 mg/dL — ABNORMAL HIGH (ref 70–99)
Glucose-Capillary: 284 mg/dL — ABNORMAL HIGH (ref 70–99)
Glucose-Capillary: 283 mg/dL — ABNORMAL HIGH (ref 70–99)

## 2020-07-10 MED ORDER — INSULIN GLARGINE 100 UNIT/ML ~~LOC~~ SOLN
45.0000 [IU] | Freq: Every day | SUBCUTANEOUS | Status: DC
  Administered 2020-07-10 – 2020-07-11 (×2): 45 [IU] via SUBCUTANEOUS
  Filled 2020-07-10 (×3): qty 0.45

## 2020-07-10 MED ORDER — LEVETIRACETAM 500 MG PO TABS
500.0000 mg | ORAL_TABLET | Freq: Two times a day (BID) | ORAL | Status: DC
  Administered 2020-07-10 – 2020-07-12 (×5): 500 mg via ORAL
  Filled 2020-07-10 (×5): qty 1

## 2020-07-10 NOTE — Evaluation (Signed)
Occupational Therapy Evaluation Patient Details Name: Nataly Pacifico MRN: [id number] DOB: 1980/11/06 Today's Date: 07/10/2020    History of Present Illness 39 y.o. 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 Patient had a CT of the head which showed 2.4 cm mass in the left frontoparietal area with possible hemorrhage and the edema of the left cerebral area with effacement of the left lateral ventricle   Clinical Impression   An occupational therapy evaluation was completed on this 39 year old, right handed, female with pertinent past medical history of Lung CA with mets to brain and completion of brain RXT on 05/24/20, as well as PMH in chart. Patient is currently requiring assistance with ADLs including min guard with bathing, min assist with UE/LE dressing esp for fasteners on clothing, and supervision with standing grooming, toileting, and setup for eating all of which is below patient's typical baseline of being Independent.  During this evaluation, patient was limited by RT hand numbness with absent light touch and severely impaired proprioception, decreased ROM and strength of entire RUE as well as 2-3+ pitting edema to dorsal RT hand, all of which has the potential to impact patient's safety and independence during functional mobility, as well as performance for ADLs. ?[place] "6-clicks" Daily Activity Inpatient Short Form score of 18/24 indicates minimal but significant ADL impairment this session. Patient lives with her son with plan to move in with family, who are able to provide PRN supervision and assistance.  Patient demonstrates good  rehab potential, and should benefit from continued skilled occupational therapy services while in acute care to maximize safety, independence and quality of life at home.  Continued occupational therapy services in a CIR setting prior to return home is recommended.    Follow Up Recommendations  CIR;SNF (Primary CIR, but SNF if pt does not qualify for CIR.)    Equipment Recommendations  3 in 1 bedside commode    Recommendations for Other Services Rehab consult     Precautions / Restrictions Precautions Precautions: Fall Precaution Comments: monitor sats Restrictions Weight Bearing Restrictions: No      Mobility Bed Mobility   Bed Mobility:  (Pt up in recliner for OT. Pt reports that she assisted herself OOB with diffculty.)              Transfers   Equipment used: Rolling walker (2 wheeled) Transfers: Sit to/from [facility] Sit to Stand: Supervision Stand pivot transfers: Supervision       General transfer comment: pt self able to rise and lower with minimal use of hands. In room ambulation with RW and supervision.    Balance   Sitting-balance support: No upper extremity supported Sitting balance-Leahy Scale: Good     Standing balance support: Single extremity supported Standing balance-Leahy Scale: Good                             ADL either performed or assessed with clinical judgement   ADL Overall ADL's : Needs assistance/impaired Eating/Feeding: Set up Eating/Feeding Details (indicate cue type and reason): Due to RT hand impairment. Grooming: Standing;Supervision/safety   Upper Body Bathing: Supervision/  safety;Set up;Sitting   Lower Body Bathing: Min guard;Sit to/from stand   Upper Body Dressing : Set up;Sitting   Lower Body Dressing: Set up;Supervision/safety Lower Body Dressing Details (indicate cue type and reason): Pt able to don/doff socks using figure 4 position on recliner. Able to lift and manage gowns before  and after toileting w/ supervision for safety. Toilet Transfer: Automotive engineer and Hygiene: Supervision/safety Toileting - Clothing Manipulation Details (indicate cue type and reason): Pt stood from commode, raised LT foot onto lower bar of RW, and performed hygiene in this way standing on LT foot only with supervision, but no LOB.     Functional mobility during ADLs: Supervision/safety;Rolling walker       Vision   Additional Comments: Pt reports some acute changes to her near vision since brain RXT. Pt able to read clock on wall with 5 min inaccuracy.     Perception     Praxis      Pertinent Vitals/Pain Pain Assessment: No/denies pain     Hand Dominance Right (Using LT more due to RT hand numbness and edema)   Extremity/Trunk Assessment Upper Extremity Assessment RUE Deficits / Details: RT hand 2+ pitting edematous, RT UE strength deficits since brain met diagnosis, poor coordination and grip strength observed, unable to lift arm more then about 75-80 degrees with strength of 2+/5 to RT shoulder and 4-/5 to grip. Elbow strength WFL. RUE Sensation: decreased light touch;decreased proprioception RUE Coordination:  (Intact finger to thumb, but impaired fine dexterity due to edema and numbess)       Cervical / Trunk Assessment Cervical / Trunk Assessment: Kyphotic   Communication Communication Communication: No difficulties   Cognition Arousal/Alertness: Awake/alert Behavior During Therapy: WFL for tasks assessed/performed Overall Cognitive Status: Within Functional Limits for tasks assessed                                 General Comments: AxO x 4 very pleasant.  Good historian and recall.   General Comments       Exercises     Shoulder Instructions      Home Living Family/patient expects to be discharged to:: Private residence Living Arrangements: Children;Other (Comment) Available Help at Discharge:  Family Type of Home: House Home Access: Stairs to enter [employer] of Steps: 4   Home Layout: Able to live on main level with bedroom/bathroom               Home Equipment: [place] - single point;Shower seat   Additional Comments: Pt plan to move in with family members after release from rehab.  Pt reports this home will have np steps to enter, a walk in shower, and pt can staty on the main floor. Pt is unsure if shower has a built in bench but pt owns a shower chair. Pt unsure of height of comode at new home.      Prior Functioning/Environment Level of Independence: Independent with assistive device(s)        Comments: Pt sleep in her recliner at baseline. Reports struggle with IADLs including cooking/cleaning/laudry since her RXT to brain in July, but prior to that, was independent.        OT Problem List: Decreased strength;Decreased coordination;Increased edema;Impaired sensation;Decreased range of motion;Decreased activity tolerance;Impaired UE functional use;Obesity      OT Treatment/Interventions: Self-care/ADL training;Therapeutic exercise;Therapeutic activities;Neuromuscular education;Energy conservation;DME and/or AE instruction;Patient/family education;Manual therapy;Balance training  OT Goals(Current goals can be found in the care plan section) Acute Rehab OT Goals Patient Stated Goal: To be able to keep up with 48 year old child. OT Goal Formulation: With patient Time For Goal Achievement: 07/24/20 Potential to Achieve Goals: Good ADL Goals Pt Will Perform Lower Body Bathing: with modified independence;sitting/lateral leans;sit to/from stand Pt Will Perform Lower Body Dressing: with modified independence;with adaptive equipment;sit to/from stand;sitting/lateral leans Pt Will Transfer to Toilet: with modified independence Pt Will Perform Toileting - Clothing Manipulation and hygiene: with modified independence Pt Will Perform Tub/Shower Transfer: with  modified independence;shower seat;rolling walker;anterior/posterior transfer Pt/caregiver will Perform Home Exercise Program: With written HEP provided;Right Upper extremity;Increased strength;With theraband Additional ADL Goal #1: Pt will be independent with RT UE edema mngt including positioning and hand pumps as well as with RT fine motor coordination home program.  OT Frequency: Min 2X/week   Barriers to D/C:    Planning a move to a new home. Pt plan to have her home fully moved into new family home while she is in [place].       Co-evaluation              AM-PAC OT "6 Clicks" Daily Activity     Outcome Measure Help from another person eating meals?: A Little Help from another person taking care of personal grooming?: A Little Help from another person toileting, which includes using toliet, bedpan, or urinal?: A Little Help from another person bathing (including washing, rinsing, drying)?: A Little Help from another person to put on and taking off regular upper body clothing?: A Little Help from another person to put on and taking off regular lower body clothing?: A Little 6 Click Score: 18   End of Session Equipment Utilized During Treatment: Gait belt;Rolling walker  Activity Tolerance: Patient tolerated treatment well Patient left: in chair;with call bell/phone within reach  OT Visit Diagnosis: Other abnormalities of gait and mobility (R26.89);History of falling (Z91.81) (decreased ADLs)                Time: 4196-2229 OT Time Calculation (min): 28 min Charges:  OT General Charges $OT Visit: 1 Visit OT Evaluation $OT Eval Low Complexity: 1 Low OT Treatments $Self Care/Home Management : 8-22 mins  Anderson Malta, OT Acute Rehab Services Office: [phone] 07/10/2020   Julien Girt 07/10/2020, 11:15 AM

## 2020-07-10 NOTE — Progress Notes (Signed)
Inpatient Rehabilitation Admissions Coordinator  Reviewed OT therapy eval. Patient not a candidate for Cir admit at this time. Supervision to min guard assist with adls. Recommend SNF which TOC is pursuing. Please call me with any questions.  Danne Baxter, RN, MSN Rehab Admissions Coordinator [phone] 07/10/2020 3:19 PM

## 2020-07-10 NOTE — Progress Notes (Signed)
Patient ID: Tara Aguilar, female   DOB: November 29, 1980, 39 y.o.   MRN: [id number]  PROGRESS NOTE    Rehanna Oloughlin  WUJ:[id number] DOB: 07-07-1981 DOA: 07/04/2020 PCP: Glenda Chroman, MD   Brief Narrative:  39 y.o. 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 Patient had a CT of the head which showed 2.4 cm mass in the left frontoparietal area with possible hemorrhage and the edema of the left cerebral area with effacement of the left lateral ventricle.  Patient was started on Keppra and Decadron and discussed with patient's oncologist/Dr. Delton Coombes who advised that patient be transferred to [facility] for radiation oncology evaluation.  Assessment & Plan:   Seizure and confusion in a patient with history of known metastasis to brain -Patient has a known history of lung cancer with metastasis to brain and recently completed brain radiation on May 24, 2020 and has been experiencing right upper and lower extremity numbness for the past few weeks and was also placed on Decadron by patient's oncologist.  Presented with seizure like episode of confusion with right-sided weakness. -CT of the head showed 2.4 cm mass in the left frontoparietal area with possible hemorrhage and the edema of the left cerebral area with effacement of the left lateral ventricle. -MRI of the brain showed minimal increase in the size of the larger 2 enhancing lesions, favored to reflect treatment change in the setting of interval radiation treatment; decreased size of the smallest lesion in the high left frontoparietal region; no new enhancing lesions identified. When accounting for  differences in technique, similar associated surrounding edema and mass effect with trace rightward midline shift. No hydrocephalus. -No need for radiation at this time as per radiation oncology.  Secure chatted with neuro oncology/Dr. Mickeal Skinner on 07/05/20 who recommended high doses of IV Decadron, upto total of 24 mg a day while hospitalized and switch to around 4 mg daily once patient is stable and not postictal.  Spoke to neurology/Dr. Rory Percy on phone on 07/05/2020 who recommended EEG.  -Spoke to Dr. Arora/neurology again on 07/06/2020 on phone who stated that EEG did not show any obvious seizure activities.  We will hold off on official neurology consultation -Seizure precautions/aspiration precautions.   -Outpatient follow-up with oncology -Tolerating diet.   -Switched to oral Keppra on 07/07/2020.  No seizures since admission.  Decrease Keppra to 500 mg twice a day as recommended by Dr. Mickeal Skinner in secure chat. -Continue Decadron to 4 mg IV every 12hours.  When she is ready for discharge, will switch to oral Decadron 4 mg daily till evaluation by Dr. Mickeal Skinner as an outpatient.  Metastatic squamous cell cancer -Being followed up by Dr. Katragadda/oncology at [place] care follow-up for goals of care discussion is appreciated.  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 Increase Lantus to 45 units daily along with CBGs with SSI.  Continue NovoLog 10 units 3 times daily with meals  Hyperkalemia -Probably spurious lab result.  Resolved.  Leukocytosis -Probably reactive.  Resolved  Morbid obesity -Outpatient follow-up  Tobacco abuse -Needs to stop smoking  History of left basilic vein thrombosis -Eliquis on hold  for now.  Communicated with Dr. Mickeal Skinner and Dr. Delton Coombes on 04/08/2020 and they recommend to not resume eliquis as the clot was superficial.  Generalized conditioning -Overall prognosis is very poor. Palliative  care following.  CODE STATUS has been changed to DNR by palliative care team. -PT eval recommended CIR.  CIR evaluated the patient and stated that she is not a CIR candidate.  Social worker following for possible SNF placement   DVT prophylaxis: SCDs.  Avoiding anticoagulation due to possible hemorrhage in the brain mass Code Status: DNR Family Communication: None at bedside Disposition Plan: Status is: Inpatient  Remains inpatient appropriate because:Inpatient level of care appropriate due to severity of illness.  Currently medically stable for discharge to CIR or SNF   Dispo: The patient is from: Home              Anticipated d/c is to: SNF/CIR              Anticipated d/c date is: 1 day              Patient currently is medically stable to d/c.   Consultants: Radiation oncology.  Discussed with Dr. Mickeal Skinner via secure chat.  Spoke to on-call neurologist/Dr. Rory Percy on phone on 07/05/2020 Palliative care  Procedures: EEG  IMPRESSION: This study is suggestive of cortical dysfunction in left hemisphere likely secondary to underlying structural abnormality, cerebral mets, postictal state.  No seizures or epileptiform discharges were seen throughout the recording.  Antimicrobials: None   Subjective: Patient seen and examined at bedside.  Denies overnight fever, headache, seizures.  No worsening shortness of breath.   Objective: Vitals:   07/09/20 1027 07/09/20 1258 07/09/20 2058 07/10/20 0620  BP: 113/82 116/81 (!) 125/97 111/71  Pulse: 88 81 99 83  Resp: 17 20 20 16   Temp: (!) 97.5 F (36.4 C)   97.7 F (36.5 C)  TempSrc: Oral   Oral  SpO2: 95% 94% 92% 98%  Weight:      Height:        Intake/Output Summary (Last 24 hours) at 07/10/2020 0755 Last data filed at 07/09/2020 1719 Gross per 24 hour  Intake 480 ml  Output --  Net 480 ml   Filed Weights   07/04/20 2157  Weight: (!) 136.7 kg    Examination:  General exam: No distress.  Chronically ill looking. Respiratory  system: Bilateral decreased breath sounds at bases with scattered crackles.  No wheezing  cardiovascular system: Rate controlled, S1-S2 heard Gastrointestinal system: Abdomen is morbidly obese, nondistended, soft and nontender.  Bowel sounds are heard extremities: Mild bilateral lower extremity edema.  Mild right upper extremity edema present.  No clubbing  Data Reviewed: I have personally reviewed following labs and imaging studies  CBC: Recent Labs  Lab 07/05/20 0000 07/05/20 0249 07/06/20 0255 07/07/20 0552  WBC 12.0* 12.1* 11.7* 10.7*  NEUTROABS 10.5*  --  10.2* 9.3*  HGB 14.5 14.2 14.4 14.8  HCT 44.1 43.0 44.2 45.0  MCV 98.0 97.7 97.6 97.4  PLT 258 258 246 786   Basic Metabolic Panel: Recent Labs  Lab 07/05/20 0000 07/05/20 0249 07/06/20 0255 07/07/20 0552 07/08/20 0558  NA 136 135 137 137 137  K 4.5 4.3 4.9 5.6* 4.5  CL 98 99 99 95* 96*  CO2 27 27 29 30 31   GLUCOSE 250* 253* 314* 289* 297*  BUN 10 12 22* 28* 27*  CREATININE 0.68 0.67 0.77 0.88 0.76  CALCIUM 9.3 9.1 9.4 9.4 9.6  MG  --   --  2.2 2.3 1.9   GFR: Estimated Creatinine Clearance: 131.9 mL/min (by C-G formula based on SCr of 0.76 mg/dL). Liver Function Tests: Recent Labs  Lab 07/05/20 0000 07/06/20 0255 07/07/20 0552  AST 27 18 45*  ALT 61* 52* 48*  ALKPHOS 93 96 91  BILITOT 1.0 0.4 1.2  PROT 6.8 7.1 7.0  ALBUMIN 3.6 3.6 3.5   No results for input(s): LIPASE, AMYLASE in the last 168 hours. No results for input(s): AMMONIA in the last 168 hours. Coagulation Profile: No results for input(s): INR, PROTIME in the last 168 hours. Cardiac Enzymes: No results for input(s): CKTOTAL, CKMB, CKMBINDEX, TROPONINI in the last 168 hours. BNP (last 3 results) No results for input(s): PROBNP in the last 8760 hours. HbA1C: No results for input(s): HGBA1C in the last 72 hours. CBG: Recent Labs  Lab 07/09/20 0802 07/09/20 1159 07/09/20 1714 07/09/20 2100 07/10/20 0742  GLUCAP 264* 385* 203* 279*  236*   Lipid Profile: No results for input(s): CHOL, HDL, LDLCALC, TRIG, CHOLHDL, LDLDIRECT in the last 72 hours. Thyroid Function Tests: No results for input(s): TSH, T4TOTAL, FREET4, T3FREE, THYROIDAB in the last 72 hours. Anemia Panel: No results for input(s): VITAMINB12, FOLATE, FERRITIN, TIBC, IRON, RETICCTPCT in the last 72 hours. Sepsis Labs: Recent Labs  Lab 07/05/20 0000 07/05/20 0249  LATICACIDVEN 1.2 1.1    Recent Results (from the past 240 hour(s))  MRSA PCR Screening     Status: None   Collection Time: 07/04/20  9:59 PM   Specimen: Nasal Mucosa; Nasopharyngeal  Result Value Ref Range Status   MRSA by PCR NEGATIVE NEGATIVE Final    Comment:        The GeneXpert MRSA Assay (FDA approved for NASAL specimens only), is one component of a comprehensive MRSA colonization surveillance program. It is not intended to diagnose MRSA infection nor to guide or monitor treatment for MRSA infections. Performed at [facility], [place] [address], [place], [place] [address]   SARS Coronavirus 2 by RT PCR (hospital order, performed in [facility] hospital lab) Nasopharyngeal Nasopharyngeal Swab     Status: None   Collection Time: 07/05/20 12:17 AM   Specimen: Nasopharyngeal Swab  Result Value Ref Range Status   SARS Coronavirus 2 NEGATIVE NEGATIVE Final    Comment: (NOTE) SARS-CoV-2 target nucleic acids are NOT DETECTED.  The SARS-CoV-2 RNA is generally detectable in upper and lower respiratory specimens during the acute phase of infection. The lowest concentration of SARS-CoV-2 viral copies this assay can detect is 250 copies / mL. A negative result does not preclude SARS-CoV-2 infection and should not be used as the sole basis for treatment or other patient management decisions.  A negative result may occur with improper specimen collection / handling, submission of specimen other than nasopharyngeal swab, presence of viral mutation(s) within the areas  targeted by this assay, and inadequate number of viral copies (<250 copies / mL). A negative result must be combined with clinical observations, patient history, and epidemiological information.  Fact Sheet for Patients:   StrictlyIdeas.no  Fact Sheet for Healthcare Providers: BankingDealers.co.za  This test is not yet approved or  cleared by the [place] FDA and has been authorized for detection and/or diagnosis of SARS-CoV-2 by FDA under an Emergency Use Authorization (EUA).  This EUA will remain in effect (meaning this test can be used) for the duration of the COVID-19 declaration under Section 564(b)(1) of the Act, 21 U.S.C. section 360bbb-3(b)(1), unless the authorization is terminated or revoked  sooner.  Performed at [facility], [place] [address], [place], [place] [address]          Radiology Studies: VAS [place] UPPER EXTREMITY VENOUS DUPLEX  Result Date: 07/09/2020 UPPER VENOUS STUDY  Indications: Swelling Risk Factors: Cancer. Limitations: Body habitus, poor ultrasound/tissue interface, bandages and open wound. Comparison Study: No prior studies. Performing Technologist: Oliver Hum RVT  Examination Guidelines: A complete evaluation includes B-mode imaging, spectral Doppler, color Doppler, and power Doppler as needed of all accessible portions of each vessel. Bilateral testing is considered an integral part of a complete examination. Limited examinations for reoccurring indications may be performed as noted.  Right Findings: +----------+------------+---------+-----------+----------+-------+ RIGHT     CompressiblePhasicitySpontaneousPropertiesSummary +----------+------------+---------+-----------+----------+-------+ IJV           Full       Yes       Yes                      +----------+------------+---------+-----------+----------+-------+ Subclavian    Full       Yes       Yes                       +----------+------------+---------+-----------+----------+-------+ Axillary      Full       Yes       Yes                      +----------+------------+---------+-----------+----------+-------+ Brachial      Full       Yes       Yes                      +----------+------------+---------+-----------+----------+-------+ Radial        Full                                          +----------+------------+---------+-----------+----------+-------+ Ulnar         Full                                          +----------+------------+---------+-----------+----------+-------+ Cephalic      Full                                          +----------+------------+---------+-----------+----------+-------+ Basilic       Full                                          +----------+------------+---------+-----------+----------+-------+  Left Findings: +----------+------------+---------+-----------+----------+-------+ LEFT      CompressiblePhasicitySpontaneousPropertiesSummary +----------+------------+---------+-----------+----------+-------+ Subclavian    Full       Yes       Yes                      +----------+------------+---------+-----------+----------+-------+  Summary:  Right: No evidence of deep vein thrombosis in the upper extremity. No evidence of superficial vein thrombosis in the upper extremity.  Left: No evidence of thrombosis in the subclavian.  *See table(s) above for measurements and observations.  Diagnosing physician: Servando Snare MD  Electronically signed by Servando Snare MD on 07/09/2020 at 1:02:34 PM.    Final         Scheduled Meds: . chlorhexidine  15 mL Mouth Rinse BID  . dexamethasone (DECADRON) injection  4 mg Intravenous Q12H  . influenza vac split quadrivalent PF  0.5 mL Intramuscular Tomorrow-1000  . insulin aspart  0-9 Units Subcutaneous TID WC  . insulin aspart  10 Units Subcutaneous TID WC  . insulin glargine  40 Units  Subcutaneous Daily  . levETIRAcetam  1,500 mg Oral BID  . mouth rinse  15 mL Mouth Rinse q12n4p   Continuous Infusions:         Aline August, MD [facility] 07/10/2020, 7:55 AM

## 2020-07-10 NOTE — TOC Progression Note (Addendum)
Transition of Care [facility]) - Progression Note    Patient Details  Name: Tara Aguilar MRN: [id number] Date of Birth: [birth year]  [facility]) CM/SW Contact  Purcell Mouton, RN Phone Number: 07/10/2020, 10:24 AM  Clinical Narrative:    Spoke with pt and [place] concerning IP Rehab. There are no beds at CIR and IP of H.P denied pt.  Need OT Eval notes ASAP for [facility] to start the authorization. [facility] in [place] made a bed offer, pt and [place] accepted the bed offer. [facility] cannot start authorization with BCBS without OT Eval and PT Eval notes. Pt will need a rapid COVID test before going to [facility].     Expected Discharge Plan: Home w Hospice Care Barriers to Discharge: Continued Medical Work up  Expected Discharge Plan and Services Expected Discharge Plan: [place]   Discharge Planning Services: CM Consult   Living arrangements for the past 2 months: Single Family Home                                       Social Determinants of Health (SDOH) Interventions    Readmission Risk Interventions No flowsheet data found.

## 2020-07-11 ENCOUNTER — Telehealth: Payer: Self-pay

## 2020-07-11 LAB — CBC WITH DIFFERENTIAL/PLATELET
Abs Immature Granulocytes: 0.55 10*3/uL — ABNORMAL HIGH (ref 0.00–0.07)
Basophils Absolute: 0.1 10*3/uL (ref 0.0–0.1)
Basophils Relative: 1 %
Eosinophils Absolute: 0 10*3/uL (ref 0.0–0.5)
Eosinophils Relative: 0 %
HCT: 45.8 % (ref 36.0–46.0)
Hemoglobin: 15.3 g/dL — ABNORMAL HIGH (ref 12.0–15.0)
Immature Granulocytes: 4 %
Lymphocytes Relative: 6 %
Lymphs Abs: 0.8 10*3/uL (ref 0.7–4.0)
MCH: 32.1 pg (ref 26.0–34.0)
MCHC: 33.4 g/dL (ref 30.0–36.0)
MCV: 96.2 fL (ref 80.0–100.0)
Monocytes Absolute: 0.6 10*3/uL (ref 0.1–1.0)
Monocytes Relative: 5 %
Neutro Abs: 10.5 10*3/uL — ABNORMAL HIGH (ref 1.7–7.7)
Neutrophils Relative %: 84 %
Platelets: 205 10*3/uL (ref 150–400)
RBC: 4.76 MIL/uL (ref 3.87–5.11)
RDW: 16 % — ABNORMAL HIGH (ref 11.5–15.5)
WBC: 12.5 10*3/uL — ABNORMAL HIGH (ref 4.0–10.5)
nRBC: 0 % (ref 0.0–0.2)

## 2020-07-11 LAB — BASIC METABOLIC PANEL
Anion gap: 11 (ref 5–15)
BUN: 30 mg/dL — ABNORMAL HIGH (ref 6–20)
CO2: 26 mmol/L (ref 22–32)
Calcium: 9 mg/dL (ref 8.9–10.3)
Chloride: 97 mmol/L — ABNORMAL LOW (ref 98–111)
Creatinine, Ser: 0.88 mg/dL (ref 0.44–1.00)
GFR calc Af Amer: >60 mL/min (ref >60)
GFR calc non Af Amer: >60 mL/min (ref >60)
Glucose, Bld: 305 mg/dL — ABNORMAL HIGH (ref 70–99)
Potassium: 4.3 mmol/L (ref 3.5–5.1)
Sodium: 134 mmol/L — ABNORMAL LOW (ref 135–145)

## 2020-07-11 LAB — MAGNESIUM: Magnesium: 1.9 mg/dL (ref 1.7–2.4)

## 2020-07-11 LAB — GLUCOSE, CAPILLARY
Glucose-Capillary: 251 mg/dL — ABNORMAL HIGH (ref 70–99)
Glucose-Capillary: 329 mg/dL — ABNORMAL HIGH (ref 70–99)
Glucose-Capillary: 366 mg/dL — ABNORMAL HIGH (ref 70–99)
Glucose-Capillary: 353 mg/dL — ABNORMAL HIGH (ref 70–99)

## 2020-07-11 MED ORDER — DEXAMETHASONE 4 MG PO TABS
4.0000 mg | ORAL_TABLET | Freq: Every day | ORAL | Status: DC
  Administered 2020-07-12: 4 mg via ORAL
  Filled 2020-07-11: qty 1

## 2020-07-11 NOTE — Telephone Encounter (Signed)
Per Dr Shelda Jakes sent schedule message to  reschedule patients appointment on 07/19/20 to 3:00pm. As requested by patients sister

## 2020-07-11 NOTE — Telephone Encounter (Signed)
Received TC from patients sister stating that she has an appointment with Dr Mickeal Skinner on 07/19/20 at 11am and would like to reschedule her appointment that day to 3:00pm. Let her know that Dr Mickeal Skinner is not in today but I will send him and his nurse a message to follow up on this request since on 9/23 it looks like he is done at 12:30 so I would need to make sure that he can see her at 3:00pm. Sister verbalized understanding. Staff message sent to Dr Mickeal Skinner and POD 3

## 2020-07-11 NOTE — Progress Notes (Signed)
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 Patient had a CT of the head which showed 2.4 cm mass in the left frontoparietal area with possible hemorrhage and the edema of the left cerebral area with effacement of the left lateral ventricle. Patient was started on Keppra and Decadron and discussed with patient's oncologist/Dr. Delton Coombes who advised that patient be transferred to [facility] for radiation oncology evaluation.  07/11/20:  Seen reports upper extremities edema improving.  She has no new complaints.  Assessment/Plan: Principal Problem:   Brain metastasis (HCC) Active Problems:   Glucose intolerance   Obesity, Class III, BMI 40-49.9 (morbid obesity) (HCC)   Squamous cell lung cancer, unspecified laterality (HCC)  Resolved Seizure and confusion in a patient with history of known metastasis to brain -Patient has a known history of lung cancer with metastasis to brain and recently completed brain radiation on May 24, 2020 and has been experiencing right upper and lower extremity numbness for the past few weeks and was also placed on Decadron by patient's oncologist.  Presented with seizure like episode of confusion with right-sided weakness. -CT of the head showed 2.4 cm mass in the left frontoparietal area with possible hemorrhage and the edema of the left cerebral area with effacement of the left lateral ventricle. -MRI of the brain showed minimal increase in the size of the larger 2 enhancing  lesions, favored to reflect treatment change in the setting of interval radiation treatment; decreased size of the smallest lesion in the high left frontoparietal region; no new enhancing lesions identified. When accounting for differences in technique, similar associated surrounding edema and mass effect with trace rightward midline shift. No hydrocephalus. -No need for radiation at this time as per radiation oncology.  Secure chatted with neuro oncology/Dr. Mickeal Skinner on 07/05/20 who recommended high doses of IV Decadron, upto total of 24 mg a day while hospitalized and switch to around 4 mg daily once patient is stable and not postictal.  Spoke to neurology/Dr. Rory Percy on phone on 07/05/2020 who recommended EEG.  -Spoke to Dr. Arora/neurology again on 07/06/2020 on phone who stated that EEG did not show any obvious seizure activities.  We will hold off on official neurology consultation -Seizure precautions/aspiration precautions.   -Outpatient follow-up with oncology -Tolerating diet.   -Switched to oral Keppra on 07/07/2020.  No seizures since admission.  Decrease Keppra to 500 mg twice a day as recommended by Dr. Mickeal Skinner in secure chat. -Continue Decadron to 4 mg IV every 12hours.  When she is ready for discharge, will switch to oral Decadron 4 mg daily till evaluation by Dr. Mickeal Skinner as an outpatient.  Metastatic squamous cell cancer -Being followed up by Dr. Katragadda/oncology at [place] care follow-up for goals of care discussion is appreciated.  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 Increase Lantus to 45 units daily along with CBGs with SSI.  Continue NovoLog 10 units 3 times daily with meals  Resolved Hyperkalemia -K+ 4.3  Leukocytosis -suspect reactive in the setting of  steroids use  Severe Morbid obesity BMI 41 -Outpatient follow-up  Tobacco abuse -Needs to stop smoking  History of left basilic vein  thrombosis -Eliquis on hold for now.  Communicated with Dr. Mickeal Skinner and Dr. Delton Coombes on 04/08/2020 and they recommend to not resume eliquis as the clot was superficial.  Generalized conditioning -Overall prognosis is very poor. Palliative care following.  CODE STATUS has been changed to DNR by palliative care team. -PT eval recommended CIR.  CIR evaluated the patient and stated that she is not a CIR candidate.  Social worker following for possible SNF placement   DVT prophylaxis: SCDs.  Avoiding anticoagulation due to possible hemorrhage in the brain mass Code Status: DNR Family Communication: None at bedside Disposition Plan: Status is: Inpatient  Remains inpatient appropriate because:Inpatient level of care appropriate due to severity of illness.  Currently medically stable for discharge to CIR or SNF   Dispo: The patient is from: Home  Anticipated d/c is to: SNF/CIR  Anticipated d/c date is: 1 day  Patient currently is medically stable to d/c.   Consultants: Radiation oncology.  Discussed with Dr. Mickeal Skinner via secure chat.  Spoke to on-call neurologist/Dr. Rory Percy on phone on 07/05/2020 Palliative care  Procedures: EEG  IMPRESSION: This study issuggestive of cortical dysfunction in left hemisphere likely secondary to underlying structural abnormality, cerebral mets, postictal state. No seizures or epileptiform discharges were seen throughout the recording.  Antimicrobials: None     Objective: Vitals:   07/10/20 0620 07/10/20 1259 07/10/20 2046 07/11/20 0410  BP: 111/71 101/79 125/82 127/77  Pulse: 83 99 89 82  Resp: 16 19 20 20   Temp: 97.7 F (36.5 C) 98.1 F (36.7 C) 97.7 F (36.5 C) 97.9 F (36.6 C)  TempSrc: Oral Oral Oral Oral  SpO2: 98% 93% 95% 94%  Weight:      Height:       No intake or output data in the 24 hours ending 07/11/20 1327 Filed Weights   07/04/20 2157  Weight: (!) 136.7 kg     Exam:  . General: 39 y.o. year-old female well developed well nourished in no acute distress.  Alert and oriented x3. . Cardiovascular: Regular rate and rhythm with no rubs or gallops.  No thyromegaly or JVD noted.   [name] Respiratory: Clear to auscultation with no wheezes or rales. Good inspiratory effort. . Abdomen: Soft nontender nondistended with normal bowel sounds x4 quadrants. . Musculoskeletal: Dependent edema diffusely. [name] Psychiatry: Mood is appropriate for condition and setting   Data Reviewed: CBC: Recent Labs  Lab 07/05/20 0000 07/05/20 0249 07/06/20 0255 07/07/20 0552 07/11/20 0519  WBC 12.0* 12.1* 11.7* 10.7* 12.5*  NEUTROABS 10.5*  --  10.2* 9.3* 10.5*  HGB 14.5 14.2 14.4 14.8 15.3*  HCT 44.1 43.0 44.2 45.0 45.8  MCV 98.0 97.7 97.6 97.4 96.2  PLT 258 258 246 256 096   Basic Metabolic Panel: Recent Labs  Lab 07/05/20 0249 07/06/20 0255 07/07/20 0552 07/08/20 0558 07/11/20 0519  NA 135 137 137 137 134*  K 4.3 4.9 5.6* 4.5 4.3  CL 99 99 95* 96* 97*  CO2 27 29 30 31 26   GLUCOSE 253* 314* 289* 297* 305*  BUN 12 22* 28* 27* 30*  CREATININE 0.67 0.77 0.88 0.76 0.88  CALCIUM 9.1 9.4 9.4 9.6 9.0  MG  --  2.2 2.3 1.9 1.9   GFR: Estimated Creatinine Clearance: 119.9 mL/min (by C-G formula based on SCr of 0.88 mg/dL). Liver Function Tests: Recent Labs  Lab 07/05/20  0000 07/06/20 0255 07/07/20 0552  AST 27 18 45*  ALT 61* 52* 48*  ALKPHOS 93 96 91  BILITOT 1.0 0.4 1.2  PROT 6.8 7.1 7.0  ALBUMIN 3.6 3.6 3.5   No results for input(s): LIPASE, AMYLASE in the last 168 hours. No results for input(s): AMMONIA in the last 168 hours. Coagulation Profile: No results for input(s): INR, PROTIME in the last 168 hours. Cardiac Enzymes: No results for input(s): CKTOTAL, CKMB, CKMBINDEX, TROPONINI in the last 168 hours. BNP (last 3 results) No results for input(s): PROBNP in the last 8760 hours. HbA1C: No results for input(s): HGBA1C in the last 72  hours. CBG: Recent Labs  Lab 07/10/20 1243 07/10/20 1608 07/10/20 2041 07/11/20 0752 07/11/20 1235  GLUCAP 272* 284* 283* 251* 329*   Lipid Profile: No results for input(s): CHOL, HDL, LDLCALC, TRIG, CHOLHDL, LDLDIRECT in the last 72 hours. Thyroid Function Tests: No results for input(s): TSH, T4TOTAL, FREET4, T3FREE, THYROIDAB in the last 72 hours. Anemia Panel: No results for input(s): VITAMINB12, FOLATE, FERRITIN, TIBC, IRON, RETICCTPCT in the last 72 hours. Urine analysis:    Component Value Date/Time   COLORURINE YELLOW 07/05/2020 0017   APPEARANCEUR CLEAR 07/05/2020 0017   LABSPEC 1.011 07/05/2020 0017   PHURINE 6.0 07/05/2020 0017   GLUCOSEU >=500 (A) 07/05/2020 0017   HGBUR SMALL (A) 07/05/2020 0017   BILIRUBINUR NEGATIVE 07/05/2020 0017   KETONESUR 5 (A) 07/05/2020 0017   PROTEINUR NEGATIVE 07/05/2020 0017   NITRITE NEGATIVE 07/05/2020 0017   LEUKOCYTESUR NEGATIVE 07/05/2020 0017   Sepsis Labs: @LABRCNTIP (procalcitonin:4,lacticidven:4)  ) Recent Results (from the past 240 hour(s))  MRSA PCR Screening     Status: None   Collection Time: 07/04/20  9:59 PM   Specimen: Nasal Mucosa; Nasopharyngeal  Result Value Ref Range Status   MRSA by PCR NEGATIVE NEGATIVE Final    Comment:        The GeneXpert MRSA Assay (FDA approved for NASAL specimens only), is one component of a comprehensive MRSA colonization surveillance program. It is not intended to diagnose MRSA infection nor to guide or monitor treatment for MRSA infections. Performed at [facility], [place] [address], [place], [place] [address]   SARS Coronavirus 2 by RT PCR (hospital order, performed in [facility] hospital lab) Nasopharyngeal Nasopharyngeal Swab     Status: None   Collection Time: 07/05/20 12:17 AM   Specimen: Nasopharyngeal Swab  Result Value Ref Range Status   SARS Coronavirus 2 NEGATIVE NEGATIVE Final    Comment: (NOTE) SARS-CoV-2 target nucleic acids are NOT  DETECTED.  The SARS-CoV-2 RNA is generally detectable in upper and lower respiratory specimens during the acute phase of infection. The lowest concentration of SARS-CoV-2 viral copies this assay can detect is 250 copies / mL. A negative result does not preclude SARS-CoV-2 infection and should not be used as the sole basis for treatment or other patient management decisions.  A negative result may occur with improper specimen collection / handling, submission of specimen other than nasopharyngeal swab, presence of viral mutation(s) within the areas targeted by this assay, and inadequate number of viral copies (<250 copies / mL). A negative result must be combined with clinical observations, patient history, and epidemiological information.  Fact Sheet for Patients:   StrictlyIdeas.no  Fact Sheet for Healthcare Providers: BankingDealers.co.za  This test is not yet approved or  cleared by the [place] FDA and has been authorized for detection and/or diagnosis of SARS-CoV-2 by FDA under an Emergency Use  Authorization (EUA).  This EUA will remain in effect (meaning this test can be used) for the duration of the COVID-19 declaration under Section 564(b)(1) of the Act, 21 U.S.C. section 360bbb-3(b)(1), unless the authorization is terminated or revoked sooner.  Performed at [facility], [place] [address] [place], [place] [address]       Studies: No results found.  Scheduled Meds: . chlorhexidine  15 mL Mouth Rinse BID  . dexamethasone (DECADRON) injection  4 mg Intravenous Q12H  . insulin aspart  0-9 Units Subcutaneous TID WC  . insulin aspart  10 Units Subcutaneous TID WC  . insulin glargine  45 Units Subcutaneous Daily  . levETIRAcetam  500 mg Oral BID  . mouth rinse  15 mL Mouth Rinse q12n4p    Continuous Infusions:   LOS: 7 days     Kayleen Memos, MD [facility] Pager [phone]  If  7PM-7AM, please contact night-coverage [email or web address] Password [facility] 07/11/2020, 1:27 PM

## 2020-07-11 NOTE — Progress Notes (Signed)
  Speech Language Pathology Treatment: Dysphagia  Patient Details Name: Tara Aguilar MRN: [id number] DOB: Sep 12, 1981 Today's Date: 07/11/2020 Time: 1219-1229 SLP Time Calculation (min) (ACUTE ONLY): 10 min  Assessment / Plan / Recommendation Clinical Impression  Pt easily passed 3 ounce Yale water test via straw without coughing post-swallow. Consumption of solids also observed with no indication of aspiration or dysphagia.  Belching noted frequently which pt attributes to changing to a "whole food diet".   She reports she is much improved compared to when admitted.  She continues with lingual deviation to the right upon protrusion and increased edema on right face, but otherwise negative CN exam.  Pt acute dysphagia has resolved due to medical improvement. Recommend regular/thin diet with general precautions.    Advised pt to utility of Yale 3 ounce water challenge.  Advised brushing dentition at least once a day/ prefer twice - She states she has been swishing and spitting with water but SLP reviewed importance of using friction for removal of biofilm.  No SLP follow up indicated and all education completed. Updated swallow precaution signs for pt.  Thanks.  HPI HPI: Tara Aguilar is a 39 y.o. 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  She had been experiencing right upper and lower extremity numbness for the last few weeks and was also placed on Decadron by patient's oncologist.  She was brought to the ER as she had a seizure-like episode with confusion and right-sided weakness.  Patient had a CT of the head which showed 2.4 cm mass in the left frontoparietal area with possible hemorrhage and the edema of the left cerebral area with effacement of the left lateral ventricle.  Patient was started on Keppra and Decadron.  Labs are showing elevated lactic acid around 4 and elevated blood  sugar.  MRI of the head was completed on 9/9 showing minimal increase in the size of the larger 2 ehancing leasions, favored to reflect treatment change in the setting of interval radiation treatment, decreased size of the smallest lesion in the high left frontoparietal region with no new enhancing lesions identified.  When accounting for differences in technique, similar associated surrounding edema and mass effect with trace rightward midline shift was seen.        SLP Plan  All goals met       Recommendations  Diet recommendations: Regular;Thin liquid Liquids provided via: Cup;Straw Medication Administration: Whole meds with liquid Supervision: Patient able to self feed Compensations: Slow rate;Small sips/bites Postural Changes and/or Swallow Maneuvers: Seated upright 90 degrees;Upright 30-60 min after meal (frequent belching during intake)                Oral Care Recommendations: Oral care BID Follow up Recommendations: Other (comment) (TBD) SLP Visit Diagnosis: Dysphagia, unspecified (R13.10) Plan: All goals met       GO                Tara Aguilar 07/11/2020, 1:56 PM   Tara Lime, MS [place] Office [phone]

## 2020-07-11 NOTE — Progress Notes (Signed)
Inpatient Diabetes Program Recommendations  AACE/ADA: New Consensus Statement on Inpatient Glycemic Control (2015)  Target Ranges:  Prepandial:   less than 140 mg/dL      Peak postprandial:   less than 180 mg/dL (1-2 hours)      Critically ill patients:  140 - 180 mg/dL   Lab Results  Component Value Date   GLUCAP 251 (H) 07/11/2020   HGBA1C 10.4 (H) 07/05/2020    Review of Glycemic Control Results for Tara Aguilar, Tara Aguilar (MRN [id number]) as of 07/11/2020 09:55  Ref. Range 07/10/2020 07:42 07/10/2020 12:43 07/10/2020 16:08 07/10/2020 20:41 07/11/2020 07:52  Glucose-Capillary Latest Ref Range: 70 - 99 mg/dL 236 (H) 272 (H) 284 (H) 283 (H) 251 (H)   Current orders for Inpatient glycemic control:  Lantus 45 units QD Novolog 0-9 units tidwc Novolog 10 units tid meal coverage  On Decadron 4 mg Q12H HgbA1C - 10.4%  Inpatient Diabetes Program Recommendations:     If steroid dose remains the same:  -  Increase Lantus to 52 units QD -  Increase Novolog Correction to "Resistant" 0-20 units  Continue to follow.  Thanks,  Tama Headings RN, MSN, BC-ADM Inpatient Diabetes Coordinator Team Pager [phone] (8a-5p)

## 2020-07-12 LAB — SARS CORONAVIRUS 2 BY RT PCR (HOSPITAL ORDER, PERFORMED IN ~~LOC~~ HOSPITAL LAB): SARS Coronavirus 2: NEGATIVE

## 2020-07-12 LAB — GLUCOSE, CAPILLARY
Glucose-Capillary: 215 mg/dL — ABNORMAL HIGH (ref 70–99)
Glucose-Capillary: 272 mg/dL — ABNORMAL HIGH (ref 70–99)

## 2020-07-12 MED ORDER — LEVETIRACETAM 500 MG PO TABS
500.0000 mg | ORAL_TABLET | Freq: Two times a day (BID) | ORAL | 0 refills | Status: DC
Start: 2020-07-12 — End: 2020-08-24

## 2020-07-12 MED ORDER — GABAPENTIN 300 MG PO CAPS: 300.0000 mg | ORAL_CAPSULE | Freq: Two times a day (BID) | ORAL | 0 refills | Status: DC

## 2020-07-12 MED ORDER — BLOOD GLUCOSE MONITOR KIT: PACK | 0 refills | Status: AC

## 2020-07-12 MED ORDER — INSULIN ASPART 100 UNIT/ML FLEXPEN: 10.0000 [IU] | PEN_INJECTOR | Freq: Three times a day (TID) | SUBCUTANEOUS | 0 refills | Status: AC

## 2020-07-12 MED ORDER — INSULIN GLARGINE 100 UNIT/ML ~~LOC~~ SOLN
50.0000 [IU] | Freq: Every day | SUBCUTANEOUS | Status: DC
  Administered 2020-07-12: 50 [IU] via SUBCUTANEOUS
  Filled 2020-07-12: qty 0.5

## 2020-07-12 MED ORDER — INSULIN GLARGINE 100 UNIT/ML SOLOSTAR PEN: 50.0000 [IU] | PEN_INJECTOR | Freq: Every day | SUBCUTANEOUS | 0 refills | Status: AC

## 2020-07-12 MED ORDER — DEXAMETHASONE 4 MG PO TABS: 4.0000 mg | ORAL_TABLET | Freq: Every day | ORAL | 0 refills | Status: DC

## 2020-07-12 MED ORDER — GABAPENTIN 300 MG PO CAPS
300.0000 mg | ORAL_CAPSULE | Freq: Two times a day (BID) | ORAL | Status: DC
  Filled 2020-07-12: qty 1

## 2020-07-12 NOTE — TOC Progression Note (Addendum)
Transition of Care [facility]) - Progression Note    Patient Details  Name: Ji Fairburn MRN: [id number] Date of Birth: 1981-08-30  [facility] CM/SW Contact  Purcell Mouton, RN Phone Number: 07/12/2020, 1:41 PM  Clinical Narrative:     Corey Harold was called for transportation. RN is aware.  Expected Discharge Plan: Home w Hospice Care Barriers to Discharge: Continued Medical Work up  Expected Discharge Plan and Services Expected Discharge Plan: [place]   Discharge Planning Services: CM Consult   Living arrangements for the past 2 months: Single Family Home Expected Discharge Date: 07/12/20                                     Social Determinants of Health (SDOH) Interventions    Readmission Risk Interventions No flowsheet data found.

## 2020-07-12 NOTE — Progress Notes (Signed)
Report called to [place]. [phone]. EMS arrived to transport. Copies of AVS reviewed with pt. SRP, RN

## 2020-07-12 NOTE — TOC Progression Note (Signed)
Transition of Care [facility]) - Progression Note    Patient Details  Name: Tara Aguilar MRN: [id number] Date of Birth: 10-08-1981  Transition of Care [facility]) CM/SW Contact  Purcell Mouton, RN Phone Number: 07/12/2020, 10:55 AM  Clinical Narrative:    Waiting for BCBS's SNF authorization.    Expected Discharge Plan: Home w Hospice Care Barriers to Discharge: Continued Medical Work up  Expected Discharge Plan and Services Expected Discharge Plan: [place]   Discharge Planning Services: CM Consult   Living arrangements for the past 2 months: Single Family Home Expected Discharge Date: 07/12/20                                     Social Determinants of Health (SDOH) Interventions    Readmission Risk Interventions No flowsheet data found.

## 2020-07-12 NOTE — Discharge Summary (Signed)
Discharge Summary  Tara Aguilar FTD:[id number] DOB: 05-05-1981  PCP: Glenda Chroman, MD  Admit date: 07/04/2020 Discharge date: 07/12/2020  Time spent: 35 minutes  Recommendations for Outpatient Follow-up:  1. Follow-up with radiation oncology 2. Follow-up with oncology 3. Follow-up with your PCP 4. Continue palliative care to optimize quality of life. 5. Continue PT OT with assistance and fall precautions 6. Take your medications as prescribed  Discharge Diagnoses:  Active Hospital Problems   Diagnosis Date Noted  . Brain metastasis ([place]) 07/04/2020  . Squamous cell lung cancer, unspecified laterality ([place]) 04/05/2019  . Glucose intolerance 03/11/2019  . Obesity, Class III, BMI 40-49.9 (morbid obesity) ([place]) 03/11/2019    Resolved Hospital Problems  No resolved problems to display.    Discharge Condition: Stable  Diet recommendation: Carb modified diet.  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  Patient was started on IV Keppra and IV Decadron.  Per medical records, patient's oncologist/Dr. Delton Coombes advised that patient be transferred to [facility] for radiation oncology evaluation.  MRI brain  Done on 07/05/20 revealed the following:  FINDINGS: Brain: Minimal increase in the size of the 2 larger enhancing lesions, which is favored to relate to treatment change given increased heterogeneity of enhancement of these lesions and areas of peripheral cystic change/necrosis.  The smallest lesion in the high left frontoparietal region is smaller. Mild associated susceptibility artifact, compatible with internal hemorrhage. When accounting for differences in technique, similar surrounding exuberant T2/FLAIR hyperintense edema. Similar mild mass effect. Trace rightward midline shift. Basal cisterns are patent.  No acute infarct.  No new acute hemorrhage.  No hydrocephalus.  Vascular: Major proximal arterial flow voids at the skull base are maintained.  Skull and upper cervical spine: No focal marrow replacing lesion.  Sinuses/Orbits: Small inferior left maxillary sinus retention cyst. Otherwise, the sinuses are clear. Unremarkable orbits.  Other: Small right mastoid effusion.  IMPRESSION: 1. Minimal increase in the size of the larger 2 enhancing lesions, favored to reflect treatment change in the setting of interval radiation treatment. Decreased size of the smallest lesion in the high left frontoparietal region. No new enhancing lesions identified. 2. When accounting for differences in technique, similar associated surrounding edema and mass effect with trace rightward midline shift. No hydrocephalus.  EEG done on 07/06/20 revealed the following: ABNORMALITY -Continuous slow, generalized - Intermittent rhythmic delta slow, left hemisphere  IMPRESSION: This study is suggestive of cortical dysfunction in left hemisphere likely secondary to underlying structural abnormality, cerebral mets, postictal state.  No seizures or epileptiform discharges were seen throughout the recording.  Dr. Starla Link, [place], discussed with neuro-oncologist Dr. Mickeal Skinner who provided recommendations that were applied.  Dr. Starla Link also spoke with Dr. Rory Percy, neurologist, on the phone on 07/05/20 who recommended EEG.  EEG did not show evidence of a seizure activity at the time of the procedure, findings as stated above.  Started on Keppra, seizure precautions, and aspiration  precautions.  Did not have a recurrent seizure  in the hospital.    PT evaluated and recommended CIR, per Inpatient rehab coordinator:  Patient not a candidate for Cir admit at this time. Supervision to min guard assist with adls. Recommend SNF which TOC is pursuing.  Completed IV therapies and transitioned to all oral medications, steroids and oral AED continued.  07/12/20:  Seen and examined.  No acute events overnight.  No new complaints.  Discussed prior diagnosis of polyneuropathy and previously being on gabapentin.  She declines restarting and states she has not had any neuropathic pain or discomfort.  She wants to hold off on gabapentin until she sees her oncologist.    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  Recently received brain radiation on May 24, 2020 and has been experiencing right upper and lower extremity numbness for the past few weeks.  Was on tapering steroids.  Presented with seizure like episode, confusion, and right-sided weakness. -MRI of the brain showed minimal increase in the size of the larger 2 enhancing lesions, favored to reflect treatment change in the setting of interval radiation treatment; decreased size of the smallest lesion in the high left frontoparietal region; no new enhancing lesions identified. When accounting for differences in technique, similar associated surrounding edema and mass effect with trace rightward midline shift. No hydrocephalus. -No need for radiation at this time as per radiation  oncology. Dr. Starla Link secure chatted with neuro oncology/Dr. Mickeal Skinner on 07/05/20 who recommended high doses of IV Decadron, upto total of 24 mg a day while hospitalized and switch to 4 mg daily once patient is stable and not postictal. Dr. Starla Link spoke to neurology/Dr. Rory Percy on phone on 07/05/2020 who recommended EEG.  Spoke to Dr. Arora/neurology again on 07/06/2020 on phone who stated that EEG did not show any obvious seizure activities.  -Received seizure precautions/aspiration precautions.  -Recommended outpatient follow-up with oncology and radiation oncology -Switched to oral Keppra on 07/07/2020. No seizures since admission.Decrease Keppra to 500 mg twice a day as recommended by Dr. Mickeal Skinner in secure chat. -ReceivedDecadron 4 mg IV every 12hours. -Switched to p.o. Decadron 4 mg daily on 07/12/2020.   -Continue oral Decadron 4 mg daily till evaluation by Dr. Mickeal Skinner as an outpatient on 07/19/2020 at 3 PM.  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 -Being followed up by Dr. Katragadda/oncology at [place] care follow-up for goals of care discussion is appreciated. -Continue palliative care to optimize quality of life.  New diagnosis of diabetes mellitus type 2 uncontrolled with hyperglycemia, likely exacerbated by steroids. -hemoglobin A1c 10.4.  -Increase Lantus to 50units daily  -Continue NovoLog 10 unit 3 times daily.   -Continue carb modified diet.  Resolved Hyperkalemia -K+ 4.3  Leukocytosis -suspect reactive in the setting of steroids use -Afebrile, nontoxic-appearing -No evidence of active infective process.  Severe Morbid obesity BMI 41 -Outpatient follow-up -Recommend weight loss outpatient with regular physical activity and healthy dieting.  Tobacco abuse -Needs to stop smoking  History of left basilic vein thrombosis -Eliquis on hold for now.Communicated with Dr. Mickeal Skinner and  Dr.  Delton Coombes on 04/08/2020 and they recommend to not resume eliquis as the clot was superficial.  Generalized deconditioning -Palliative care following.CODE STATUS has been changed to DNR by palliative care team. --Continue palliative care to optimize quality of life. -Continue PT OT with assistance and fall precautions.   DVT prophylaxis:SCDs. Avoiding anticoagulation due to possible hemorrhage in the brain mass  Code Status:DNR    Consultants:Radiation oncology. Discussed with Dr. Mickeal Skinner via secure chat. Spoke to on-call neurologist/Dr. Rory Percy on phone on 07/05/2020 Palliative care  Procedures:EEG     Discharge Exam: BP 113/74 (BP Location: Left Arm)   Pulse 87   Temp 98.4 F (36.9 C) (Oral)   Resp 20   Ht $R'5\' 6"'Dr$  (1.676 m)   Wt (!) 136.7 kg   SpO2 93%   BMI 48.64 kg/m  . General: 39 y.o. year-old female well developed well nourished in no acute distress.  Alert and oriented x3. . Cardiovascular: Regular rate and rhythm with no rubs or gallops.  No thyromegaly or JVD noted.   [name] Respiratory: Clear to auscultation with no wheezes or rales. Good inspiratory effort. . Abdomen: Soft nontender nondistended with normal bowel sounds x4 quadrants. . Musculoskeletal: Improved, trace lower extremity edema. [name] Psychiatry: Mood is appropriate for condition and setting  Discharge Instructions You were cared for by a hospitalist during your hospital stay. If you have any questions about your discharge medications or the care you received while you were in the hospital after you are discharged, you can call the unit and asked to speak with the hospitalist on call if the hospitalist that took care of you is not available. Once you are discharged, your primary care physician will handle any further medical issues. Please note that NO REFILLS for any discharge medications will be authorized once you are discharged, as it is imperative that you return to your primary care physician (or  establish a relationship with a primary care physician if you do not have one) for your aftercare needs so that they can reassess your need for medications and monitor your lab values.   Allergies as of 07/12/2020   No Known Allergies     Medication List    STOP taking these medications   apixaban 5 MG Tabs tablet Commonly known as: Eliquis   cephALEXin 500 MG capsule Commonly known as: KEFLEX   Compressor/Nebulizer Misc   gabapentin 300 MG capsule Commonly known as: NEURONTIN   oxyCODONE 5 MG immediate release tablet Commonly known as: Oxy IR/ROXICODONE   prochlorperazine 10 MG tablet Commonly known as: COMPAZINE     TAKE these medications   albuterol (2.5 MG/3ML) 0.083% nebulizer solution Commonly known as: PROVENTIL Take 3 mLs (2.5 mg total) by nebulization every 2 (two) hours as needed for wheezing.   albuterol 108 (90 Base) MCG/ACT inhaler Commonly known as: VENTOLIN HFA Inhale 2 puffs into the lungs every 6 (six) hours as needed for wheezing or shortness of breath.   blood glucose meter kit and supplies Kit Dispense based on patient and insurance preference. Use up to four times daily as directed. (FOR ICD-9 250.00, 250.01).   dexamethasone 4 MG tablet Commonly known as: DECADRON Take 1 tablet (4 mg total) by mouth daily for 14 days. What changed:   how much to take  how to take this  when to take this  additional instructions   ibuprofen 200 MG tablet Commonly known as: ADVIL Take 200 mg by mouth every 6 (six) hours as needed for  fever, headache or mild pain.   insulin aspart 100 UNIT/ML FlexPen Commonly known as: NOVOLOG Inject 10 Units into the skin 3 (three) times daily with meals.   insulin glargine 100 UNIT/ML Solostar Pen Commonly known as: LANTUS Inject 50 Units into the skin daily.   levETIRAcetam 500 MG tablet Commonly known as: KEPPRA Take 1 tablet (500 mg total) by mouth 2 (two) times daily.      No Known Allergies  Contact  information for follow-up providers    Vyas, Dhruv B, MD. Call in 1 day(s).   Specialty: Internal Medicine Why: please call for a post hospital follow up appointment Contact information: [place] [address] [place]        Ventura Sellers, MD. Call in 1 day(s).   Specialties: Psychiatry, Neurology, Oncology Why: PLease call for a post hospital follow up appointment Contact information: [address] [place] [address] [phone]        Derek Jack, MD. Call in 1 day(s).   Specialty: Hematology Why: please call for a post hospital follow up appointment. Contact information: [place] [address] [phone]            Contact information for after-discharge care    [facility] .   Service: Skilled Nursing Contact information: [address] [place] [phone]                   The results of significant diagnostics from this hospitalization (including imaging, microbiology, ancillary and laboratory) are listed below for reference.    Significant Diagnostic Studies: EEG  Result Date: 07/06/2020 Lora Havens, MD     07/06/2020 11:28 AM Patient Name: Lizza Huffaker MRN: [id number] Epilepsy Attending: Lora Havens Referring Physician/Provider: Dr Daryel November Date: 07/05/2020 Duration: 24.39 minutes Patient history: 39 year old female with 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  EEG to evaluate for seizures. Level of alertness: Awake AEDs during EEG study: Keppra Technical aspects: This EEG study was done with scalp electrodes positioned according to the 10-20 International system of electrode placement. Electrical activity was acquired at a sampling rate of $Remov'500Hz'DFRYTh$  and reviewed with a high frequency filter of $RemoveB'70Hz'ioHoBQMp$  and a low frequency filter of $RemoveB'1Hz'JcfgXCFo$ . EEG data  were recorded continuously and digitally stored. Description: No clear posterior dominant rhythm was seen.  EEG showed condyle generalized more for 3 to 6 Hz theta-delta slowing.  Intermittent rhythmic 2 to 3 Hz delta slowing was also seen in left hemisphere.  Hyperventilation and photic stimulation were not performed.   ABNORMALITY -Continuous slow, generalized - Intermittent rhythmic delta slow, left hemisphere IMPRESSION: This study is suggestive of cortical dysfunction in left hemisphere likely secondary to underlying structural abnormality, cerebral mets, postictal state.  No seizures or epileptiform discharges were seen throughout the recording. Lora Havens   CT Chest W Contrast  Result Date: 06/13/2020 CLINICAL DATA:  Lung cancer, history of head/neck cancer. Last radiation treatment 2 weeks ago. EXAM: CT CHEST WITH CONTRAST TECHNIQUE: Multidetector CT imaging of the chest was performed during intravenous contrast administration. CONTRAST:  24mL OMNIPAQUE IOHEXOL 300 MG/ML  SOLN COMPARISON:  CT abdomen pelvis 05/29/2020 and CT chest 03/28/2020. FINDINGS: Cardiovascular: Heart is at the upper limits of normal in size. No pericardial effusion. Mediastinum/Nodes: There is soft tissue asymmetry anterior to the right anterior scalene musculature (2/6)  as well as right paratracheal station, with associated venous collaterals, similar to the prior exam. No discrete pathologically enlarged mediastinal, hilar or axillary lymph nodes. Esophagus is unremarkable. Lungs/Pleura: Linear densities in the medial aspect of the upper right hemithorax are indicative of radiation therapy. Previously seen diffuse pulmonary parenchymal micronodularity has nearly completely resolved in the interval. Chronic appearing volume loss in the right middle and right lower lobes, adjacent to an elevated right hemidiaphragm. No pleural fluid. Airway is unremarkable. Upper Abdomen: Hyperdense collateral perfusion is seen in the left  hepatic lobe. Visualized portions of the liver, gallbladder, adrenal glands, kidneys, spleen, pancreas, stomach and bowel are otherwise grossly unremarkable. Musculoskeletal: Degenerative changes in the spine. No worrisome lytic or sclerotic lesions. IMPRESSION: 1. Near complete resolution of previously seen diffuse micronodularity, indicative of resolving metastatic disease. 2. Asymmetric right thoracic inlet and right paratracheal soft tissue with associated collateral venous flow, stable. Electronically Signed   By: Lorin Picket M.D.   On: 06/13/2020 09:33   MR BRAIN W WO CONTRAST  Result Date: 07/05/2020 CLINICAL DATA:  Brain/CNS neoplasm staging EXAM: MRI HEAD WITHOUT AND WITH CONTRAST TECHNIQUE: Multiplanar, multiecho pulse sequences of the brain and surrounding structures were obtained without and with intravenous contrast. CONTRAST:  61mL GADAVIST GADOBUTROL 1 MMOL/ML IV SOLN COMPARISON:  MRI 05/09/2020 and 05/10/2020 FINDINGS: Brain: Minimal increase in the size of the 2 larger enhancing lesions, which is favored to relate to treatment change given increased heterogeneity of enhancement of these lesions and areas of peripheral cystic change/necrosis. The smallest lesion in the high left frontoparietal region is smaller. Mild associated susceptibility artifact, compatible with internal hemorrhage. When accounting for differences in technique, similar surrounding exuberant T2/FLAIR hyperintense edema. Similar mild mass effect. Trace rightward midline shift. Basal cisterns are patent. No acute infarct.  No new acute hemorrhage.  No hydrocephalus. Vascular: Major proximal arterial flow voids at the skull base are maintained. Skull and upper cervical spine: No focal marrow replacing lesion. Sinuses/Orbits: Small inferior left maxillary sinus retention cyst. Otherwise, the sinuses are clear. Unremarkable orbits. Other: Small right mastoid effusion. IMPRESSION: 1. Minimal increase in the size of the larger 2  enhancing lesions, favored to reflect treatment change in the setting of interval radiation treatment. Decreased size of the smallest lesion in the high left frontoparietal region. No new enhancing lesions identified. 2. When accounting for differences in technique, similar associated surrounding edema and mass effect with trace rightward midline shift. No hydrocephalus. Electronically Signed   By: Margaretha Sheffield MD   On: 07/05/2020 11:37   VAS [place] UPPER EXTREMITY VENOUS DUPLEX  Result Date: 07/09/2020 UPPER VENOUS STUDY  Indications: Swelling Risk Factors: Cancer. Limitations: Body habitus, poor ultrasound/tissue interface, bandages and open wound. Comparison Study: No prior studies. Performing Technologist: Oliver Hum RVT  Examination Guidelines: A complete evaluation includes B-mode imaging, spectral Doppler, color Doppler, and power Doppler as needed of all accessible portions of each vessel. Bilateral testing is considered an integral part of a complete examination. Limited examinations for reoccurring indications may be performed as noted.  Right Findings: +----------+------------+---------+-----------+----------+-------+ RIGHT     CompressiblePhasicitySpontaneousPropertiesSummary +----------+------------+---------+-----------+----------+-------+ IJV           Full       Yes       Yes                      +----------+------------+---------+-----------+----------+-------+ Subclavian    Full       Yes  Yes                      +----------+------------+---------+-----------+----------+-------+ Axillary      Full       Yes       Yes                      +----------+------------+---------+-----------+----------+-------+ Brachial      Full       Yes       Yes                      +----------+------------+---------+-----------+----------+-------+ Radial        Full                                           +----------+------------+---------+-----------+----------+-------+ Ulnar         Full                                          +----------+------------+---------+-----------+----------+-------+ Cephalic      Full                                          +----------+------------+---------+-----------+----------+-------+ Basilic       Full                                          +----------+------------+---------+-----------+----------+-------+  Left Findings: +----------+------------+---------+-----------+----------+-------+ LEFT      CompressiblePhasicitySpontaneousPropertiesSummary +----------+------------+---------+-----------+----------+-------+ Subclavian    Full       Yes       Yes                      +----------+------------+---------+-----------+----------+-------+  Summary:  Right: No evidence of deep vein thrombosis in the upper extremity. No evidence of superficial vein thrombosis in the upper extremity.  Left: No evidence of thrombosis in the subclavian.  *See table(s) above for measurements and observations.  Diagnosing physician: Servando Snare MD Electronically signed by Servando Snare MD on 07/09/2020 at 1:02:34 PM.    Final     Microbiology: Recent Results (from the past 240 hour(s))  MRSA PCR Screening     Status: None   Collection Time: 07/04/20  9:59 PM   Specimen: Nasal Mucosa; Nasopharyngeal  Result Value Ref Range Status   MRSA by PCR NEGATIVE NEGATIVE Final    Comment:        The GeneXpert MRSA Assay (FDA approved for NASAL specimens only), is one component of a comprehensive MRSA colonization surveillance program. It is not intended to diagnose MRSA infection nor to guide or monitor treatment for MRSA infections. Performed at [facility], [place] [address] [place], [place] [address]   SARS Coronavirus 2 by RT PCR (hospital order, performed in [facility] hospital lab) Nasopharyngeal Nasopharyngeal Swab     Status: None    Collection Time: 07/05/20 12:17 AM   Specimen: Nasopharyngeal Swab  Result Value Ref Range Status   SARS Coronavirus 2 NEGATIVE NEGATIVE Final    Comment: (NOTE) SARS-CoV-2 target nucleic acids are  NOT DETECTED.  The SARS-CoV-2 RNA is generally detectable in upper and lower respiratory specimens during the acute phase of infection. The lowest concentration of SARS-CoV-2 viral copies this assay can detect is 250 copies / mL. A negative result does not preclude SARS-CoV-2 infection and should not be used as the sole basis for treatment or other patient management decisions.  A negative result may occur with improper specimen collection / handling, submission of specimen other than nasopharyngeal swab, presence of viral mutation(s) within the areas targeted by this assay, and inadequate number of viral copies (<250 copies / mL). A negative result must be combined with clinical observations, patient history, and epidemiological information.  Fact Sheet for Patients:   StrictlyIdeas.no  Fact Sheet for Healthcare Providers: BankingDealers.co.za  This test is not yet approved or  cleared by the [place] FDA and has been authorized for detection and/or diagnosis of SARS-CoV-2 by FDA under an Emergency Use Authorization (EUA).  This EUA will remain in effect (meaning this test can be used) for the duration of the COVID-19 declaration under Section 564(b)(1) of the Act, 21 U.S.C. section 360bbb-3(b)(1), unless the authorization is terminated or revoked sooner.  Performed at [facility], [place] [address] [place], [place] [address]      Labs: Basic Metabolic Panel: Recent Labs  Lab 07/06/20 0255 07/07/20 0552 07/08/20 0558 07/11/20 0519  NA 137 137 137 134*  K 4.9 5.6* 4.5 4.3  CL 99 95* 96* 97*  CO2 $Re'29 30 31 26  'PjK$ GLUCOSE 314* 289* 297* 305*  BUN 22* 28* 27* 30*  CREATININE 0.77 0.88 0.76 0.88  CALCIUM 9.4  9.4 9.6 9.0  MG 2.2 2.3 1.9 1.9   Liver Function Tests: Recent Labs  Lab 07/06/20 0255 07/07/20 0552  AST 18 45*  ALT 52* 48*  ALKPHOS 96 91  BILITOT 0.4 1.2  PROT 7.1 7.0  ALBUMIN 3.6 3.5   No results for input(s): LIPASE, AMYLASE in the last 168 hours. No results for input(s): AMMONIA in the last 168 hours. CBC: Recent Labs  Lab 07/06/20 0255 07/07/20 0552 07/11/20 0519  WBC 11.7* 10.7* 12.5*  NEUTROABS 10.2* 9.3* 10.5*  HGB 14.4 14.8 15.3*  HCT 44.2 45.0 45.8  MCV 97.6 97.4 96.2  PLT 246 256 205   Cardiac Enzymes: No results for input(s): CKTOTAL, CKMB, CKMBINDEX, TROPONINI in the last 168 hours. BNP: BNP (last 3 results) No results for input(s): BNP in the last 8760 hours.  ProBNP (last 3 results) No results for input(s): PROBNP in the last 8760 hours.  CBG: Recent Labs  Lab 07/11/20 1235 07/11/20 1719 07/11/20 2121 07/12/20 0742 07/12/20 1154  GLUCAP 329* 366* 353* 215* 272*       Signed:  Kayleen Memos, MD [facility] 07/12/2020, 12:21 PM

## 2020-07-12 NOTE — Discharge Instructions (Signed)
Seizure, Adult °A seizure is a sudden burst of abnormal electrical activity in the brain. Seizures usually last from 30 seconds to 2 minutes. They can cause many different symptoms. °Usually, seizures are not harmful unless they last a long time. °What are the causes? °Common causes of this condition include: °· Fever or infection. °· Conditions that affect the brain, such as: °? A brain abnormality that you were born with. °? A brain or head injury. °? Bleeding in the brain. °? A tumor. °? Stroke. °? Brain disorders such as autism or cerebral palsy. °· Low blood sugar. °· Conditions that are passed from parent to child (are inherited). °· Problems with substances, such as: °? Having a reaction to a drug or a medicine. °? Suddenly stopping the use of a substance (withdrawal). °In some cases, the cause may not be known. A person who has repeated seizures over time without a clear cause has a condition called epilepsy. °What increases the risk? °You are more likely to get this condition if you have: °· A family history of epilepsy. °· Had a seizure in the past. °· A brain disorder. °· A history of head injury, lack of oxygen at birth, or strokes. °What are the signs or symptoms? °There are many types of seizures. The symptoms vary depending on the type of seizure you have. Examples of symptoms during a seizure include: °· Shaking (convulsions). °· Stiffness in the body. °· Passing out (losing consciousness). °· Head nodding. °· Staring. °· Not responding to sound or touch. °· Loss of bladder control and bowel control. °Some people have symptoms right before and right after a seizure happens. °Symptoms before a seizure may include: °· Fear. °· Worry (anxiety). °· Feeling like you may vomit (nauseous). °· Feeling like the room is spinning (vertigo). °· Feeling like you saw or heard something before (déjà vu). °· Odd tastes or smells. °· Changes in how you see. You may see flashing lights or spots. °Symptoms after a  seizure happens can include: °· Confusion. °· Sleepiness. °· Headache. °· Weakness on one side of the body. °How is this treated? °Most seizures will stop on their own in under 5 minutes. In these cases, no treatment is needed. Seizures that last longer than 5 minutes will usually need treatment. Treatment can include: °· Medicines given through an IV tube. °· Avoiding things that are known to cause your seizures. These can include medicines that you take for another condition. °· Medicines to treat epilepsy. °· Surgery to stop the seizures. This may be needed if medicines do not help. °Follow these instructions at home: °Medicines °· Take over-the-counter and prescription medicines only as told by your doctor. °· Do not eat or drink anything that may keep your medicine from working, such as alcohol. °Activity °· Do not do any activities that would be dangerous if you had another seizure, like driving or swimming. Wait until your doctor says it is safe for you to do them. °· If you live in the [place] ask your local DMV (department of motor vehicles) when you can drive. °· Get plenty of rest. °Teaching others °Teach friends and family what to do when you have a seizure. They should: °· Lay you on the ground. °· Protect your head and body. °· Loosen any tight clothing around your neck. °· Turn you on your side. °· Not hold you down. °· Not put anything into your mouth. °· Know whether or not you need emergency care. °· Stay   with you until you are better. ° °General instructions °· Contact your doctor each time you have a seizure. °· Avoid anything that gives you seizures. °· Keep a seizure diary. Write down: °? What you think caused each seizure. °? What you remember about each seizure. °· Keep all follow-up visits as told by your doctor. This is important. °Contact a doctor if: °· You have another seizure. °· You have seizures more often. °· There is any change in what happens during your seizures. °· You keep having  seizures with treatment. °· You have symptoms of being sick or having an infection. °Get help right away if: °· You have a seizure that: °? Lasts longer than 5 minutes. °? Is different than seizures you had before. °? Makes it harder to breathe. °? Happens after you hurt your head. °· You have any of these symptoms after a seizure: °? Not being able to speak. °? Not being able to use a part of your body. °? Confusion. °? A bad headache. °· You have two or more seizures in a row. °· You do not wake up right after a seizure. °· You get hurt during a seizure. °These symptoms may be an emergency. Do not wait to see if the symptoms will go away. Get medical help right away. Call your local emergency services (911 in the [place] Do not drive yourself to the hospital. °Summary °· Seizures usually last from 30 seconds to 2 minutes. Usually, they are not harmful unless they last a long time. °· Do not eat or drink anything that may keep your medicine from working, such as alcohol. °· Teach friends and family what to do when you have a seizure. °· Contact your doctor each time you have a seizure. °This information is not intended to replace advice given to you by your health care provider. Make sure you discuss any questions you have with your health care provider. °Document Revised: 12/31/2018 Document Reviewed: 12/31/2018 °Elsevier Patient Education © 2020 [employer] Inc. ° °

## 2020-07-12 NOTE — TOC Progression Note (Signed)
Transition of Care [facility]) - Progression Note    Patient Details  Name: Tara Aguilar MRN: [id number] Date of Birth: 1981/05/24  [facility] CM/SW Contact  Purcell Mouton, RN Phone Number: 07/12/2020, 11:39 AM  Clinical Narrative:    Pt will discharge to [facility] in [place]. Maryann Alar [phone] contact person was called to make aware of transfer.    Expected Discharge Plan: Home w Hospice Care Barriers to Discharge: Continued Medical Work up  Expected Discharge Plan and Services Expected Discharge Plan: [place]   Discharge Planning Services: CM Consult   Living arrangements for the past 2 months: Single Family Home Expected Discharge Date: 07/12/20                                     Social Determinants of Health (SDOH) Interventions    Readmission Risk Interventions No flowsheet data found.

## 2020-07-19 ENCOUNTER — Inpatient Hospital Stay: Payer: BC Managed Care – PPO | Attending: Internal Medicine | Admitting: Internal Medicine

## 2020-07-19 ENCOUNTER — Other Ambulatory Visit: Payer: Self-pay

## 2020-07-19 ENCOUNTER — Encounter: Payer: Self-pay | Admitting: Internal Medicine

## 2020-07-19 VITALS — BP 135/81 | HR 121 | Temp 98.0°F | Resp 18 | Ht 66.0 in | Wt 305.7 lb

## 2020-07-19 DIAGNOSIS — G40909 Epilepsy, unspecified, not intractable, without status epilepticus: Secondary | ICD-10-CM | POA: Diagnosis not present

## 2020-07-19 DIAGNOSIS — C7931 Secondary malignant neoplasm of brain: Secondary | ICD-10-CM | POA: Diagnosis present

## 2020-07-19 DIAGNOSIS — F17210 Nicotine dependence, cigarettes, uncomplicated: Secondary | ICD-10-CM | POA: Insufficient documentation

## 2020-07-19 DIAGNOSIS — R29818 Other symptoms and signs involving the nervous system: Secondary | ICD-10-CM | POA: Insufficient documentation

## 2020-07-19 DIAGNOSIS — R569 Unspecified convulsions: Secondary | ICD-10-CM

## 2020-07-19 DIAGNOSIS — Z79899 Other long term (current) drug therapy: Secondary | ICD-10-CM | POA: Insufficient documentation

## 2020-07-19 DIAGNOSIS — Z923 Personal history of irradiation: Secondary | ICD-10-CM | POA: Diagnosis not present

## 2020-07-19 MED ORDER — DEXAMETHASONE 1 MG PO TABS
3.0000 mg | ORAL_TABLET | Freq: Every day | ORAL | 1 refills | Status: DC
Start: 2020-07-19 — End: 2020-07-19

## 2020-07-19 MED ORDER — DEXAMETHASONE 1 MG PO TABS
3.0000 mg | ORAL_TABLET | Freq: Every day | ORAL | 1 refills | Status: DC
Start: 2020-07-19 — End: 2020-08-24

## 2020-07-19 NOTE — Progress Notes (Signed)
Tara Aguilar, [place] [address] [phone]   New Patient Evaluation  Date of Service: 07/19/20 Patient Name: Tara Aguilar Patient MRN: [id number] Patient DOB: [birth year] Provider: Ventura Sellers, MD  Identifying Statement:  Tara Aguilar is a 39 y.o. 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    Referring Provider: Glenda Chroman, MD [address] [place],  [place] [address]  Primary Cancer:  Oncologic History: Oncology History  Squamous cell lung cancer, unspecified laterality ([place])  04/05/2019 Initial Diagnosis   Squamous cell lung cancer, unspecified laterality ([place])   04/08/2019 -  Chemotherapy   The patient had palonosetron (ALOXI) injection 0.25 mg, 0.25 mg, Intravenous,  Once, 7 of 7 cycles Administration: 0.25 mg (04/08/2019), 0.25 mg (05/02/2019), 0.25 mg (05/23/2019), 0.25 mg (06/13/2019), 0.25 mg (07/05/2019), 0.25 mg (07/26/2019), 0.25 mg (08/16/2019) pegfilgrastim (NEULASTA) injection 6 mg, 6 mg, Subcutaneous, Once, 2 of 2 cycles Administration: 6 mg (04/11/2019), 6 mg (05/04/2019) pegfilgrastim (NEULASTA ONPRO KIT) injection 6 mg, 6 mg, Subcutaneous, Once, 3 of 3 cycles pegfilgrastim-cbqv (UDENYCA) injection 6 mg, 6 mg, Subcutaneous, Once, 5 of 5 cycles Administration: 6 mg (05/25/2019), 6 mg (06/15/2019), 6 mg (07/07/2019), 6 mg (07/28/2019), 6 mg (08/18/2019) CARBOplatin (PARAPLATIN) 900 mg in sodium chloride 0.9 % 500 mL chemo infusion, 900 mg (100 % of original dose 900 mg), Intravenous,  Once, 7 of 7 cycles Dose modification:   (original dose 900 mg, Cycle 1),   (original dose 900 mg, Cycle 2),   (original dose 900 mg, Cycle 3),   (original dose 900 mg, Cycle 4) Administration: 900 mg (04/08/2019), 900 mg (05/02/2019), 900 mg (05/23/2019), 900 mg (06/13/2019), 900 mg (07/05/2019), 900 mg (07/26/2019), 900 mg  (08/16/2019) PACLitaxel (TAXOL) 492 mg in sodium chloride 0.9 % 500 mL chemo infusion (> $RemoveBef'80mg'vJTUNUGSyN$ /m2), 200 mg/m2 = 492 mg, Intravenous,  Once, 7 of 7 cycles Dose modification: 175 mg/m2 (original dose 200 mg/m2, Cycle 6, Reason: Other (see comments), Comment: neuropathy) Administration: 492 mg (04/08/2019), 492 mg (05/02/2019), 492 mg (05/23/2019), 492 mg (06/13/2019), 492 mg (07/05/2019), 432 mg (07/26/2019), 432 mg (08/16/2019) pembrolizumab (KEYTRUDA) 200 mg in sodium chloride 0.9 % 50 mL chemo infusion, 200 mg, Intravenous, Once, 18 of 21 cycles Administration: 200 mg (04/08/2019), 200 mg (07/05/2019), 200 mg (05/02/2019), 200 mg (05/23/2019), 200 mg (06/13/2019), 200 mg (07/26/2019), 200 mg (08/16/2019), 200 mg (09/06/2019), 200 mg (09/27/2019), 200 mg (10/19/2019), 200 mg (11/09/2019), 200 mg (11/30/2019), 200 mg (12/21/2019), 200 mg (01/12/2020), 200 mg (02/07/2020), 200 mg (02/28/2020), 200 mg (03/21/2020), 200 mg (04/11/2020) fosaprepitant (EMEND) 150 mg, dexamethasone (DECADRON) 12 mg in sodium chloride 0.9 % 145 mL IVPB, , Intravenous,  Once, 7 of 7 cycles Administration:  (04/08/2019),  (05/02/2019),  (05/23/2019),  (06/13/2019),  (07/05/2019),  (07/26/2019),  (08/16/2019)  for chemotherapy treatment.     CNS Oncologic Hx 05/24/20: Completes frx SRS to 3 left frontoparietal targets  History of Present Illness: The patient's records from the referring physician were obtained and reviewed and the patient interviewed to confirm this HPI.  Tara Aguilar presented to medical attention this past month with new onset seizure.  Event was described as interrupted awareness, followed by right sided shaking for several minutes.  After event there was confusion which persisted for most of the day, and resolved by discharge as patient returned to baseline.  Since going home on Keppra $RemoveB'500mg'NInZFDMT$  twice per  day and decadron $RemoveBefo'4mg'yNxTOfTywXI$  daily, there have been no further seizure events.  Continues to experience some short term memory impairment and  sluggishness following radiation therapy which was completed at the end of July 2021.  Soon will plan to resume Keytruda consolidation with Dr. Delton Coombes.   Medications: Current Outpatient Medications on File Prior to Visit  Medication Sig Dispense Refill  . albuterol (PROVENTIL) (2.5 MG/3ML) 0.083% nebulizer solution Take 3 mLs (2.5 mg total) by nebulization every 2 (two) hours as needed for wheezing. 75 mL 12  . albuterol (VENTOLIN HFA) 108 (90 Base) MCG/ACT inhaler Inhale 2 puffs into the lungs every 6 (six) hours as needed for wheezing or shortness of breath. 18 g 3  . blood glucose meter kit and supplies KIT Dispense based on patient and insurance preference. Use up to four times daily as directed. (FOR ICD-9 250.00, 250.01). 1 each 0  . dexamethasone (DECADRON) 4 MG tablet Take 1 tablet (4 mg total) by mouth daily for 14 days. 14 tablet 0  . ibuprofen (ADVIL) 200 MG tablet Take 200 mg by mouth every 6 (six) hours as needed for fever, headache or mild pain.    [name] insulin aspart (NOVOLOG) 100 UNIT/ML FlexPen Inject 10 Units into the skin 3 (three) times daily with meals. 15 mL 0  . insulin glargine (LANTUS) 100 UNIT/ML Solostar Pen Inject 50 Units into the skin daily. 15 mL 0  . levETIRAcetam (KEPPRA) 500 MG tablet Take 1 tablet (500 mg total) by mouth 2 (two) times daily. 120 tablet 0   No current facility-administered medications on file prior to visit.    Allergies: No Known Allergies Past Medical History:  Past Medical History:  Diagnosis Date  . Cancer ([place])   . Depression   . Glucose intolerance 03/11/2019  . Obesity, Class III, BMI 40-49.9 (morbid obesity) ([place]) 03/11/2019   Past Surgical History:  Past Surgical History:  Procedure Laterality Date  . CESAREAN SECTION    . IRRIGATION AND DEBRIDEMENT ABSCESS  03/15/2019   RETROPHARYNGEAL   . TONSILLECTOMY AND ADENOIDECTOMY N/A 03/15/2019   Procedure: IRRIGATION AND DEBRIDEMENT OF RETROPHARYNGEAL ABSCESS;  Surgeon: Leta Baptist, MD;   Location: [facility];  Service: ENT;  Laterality: N/A;   Social History:  Social History   Socioeconomic History  . Marital status: Widowed    Spouse name: Not on file  . Number of children: 1  . Years of education: Not on file  . Highest education level: Not on file  Occupational History  . Not on file  Tobacco Use  . Smoking status: Current Some Day Smoker    Packs/day: 1.00    Years: 25.00    Pack years: 25.00    Types: Cigarettes  . Smokeless tobacco: Never Used  Vaping Use  . Vaping Use: Never used  Substance and Sexual Activity  . Alcohol use: Not Currently  . Drug use: Not Currently  . Sexual activity: Not on file  Other Topics Concern  . Not on file  Social History Narrative   04-05-2019   Husband passed away four months ago from Cancer of the [place]mpula.   Pt. Has 63 yo son at home.   Social Determinants of Health   Financial Resource Strain:   . Difficulty of Paying Living Expenses: Not on file  Food Insecurity:   . Worried About Charity fundraiser in the Last Year: Not on file  . Ran Out of Food in the Last Year: Not on file  Transportation  Needs:   . Lack of Transportation (Medical): Not on file  . Lack of Transportation (Non-Medical): Not on file  Physical Activity:   . Days of Exercise per Week: Not on file  . Minutes of Exercise per Session: Not on file  Stress:   . Feeling of Stress : Not on file  Social Connections:   . Frequency of Communication with Friends and Family: Not on file  . Frequency of Social Gatherings with Friends and Family: Not on file  . Attends Religious Services: Not on file  . Active Member of Clubs or Organizations: Not on file  . Attends Archivist Meetings: Not on file  . Marital Status: Not on file  Intimate Partner Violence:   . Fear of Current or Ex-Partner: Not on file  . Emotionally Abused: Not on file  . Physically Abused: Not on file  . Sexually Abused: Not on file   Family History:  Family History   Problem Relation Age of Onset  . Lung cancer Mother   . Hypertension Mother   . Leukemia Father   . Myelodysplastic syndrome Father     Review of Systems: Constitutional: Doesn't report fevers, chills or abnormal weight loss Eyes: Doesn't report blurriness of vision Ears, nose, mouth, throat, and face: Doesn't report sore throat Respiratory: Doesn't report cough, dyspnea or wheezes Cardiovascular: Doesn't report palpitation, chest discomfort  Gastrointestinal:  Doesn't report nausea, constipation, diarrhea GU: Doesn't report incontinence Skin: Doesn't report skin rashes Neurological: Per HPI Musculoskeletal: Doesn't report joint pain Behavioral/Psych: Doesn't report anxiety  Physical Exam: Vitals:   07/19/20 1518  BP: 135/81  Pulse: (!) 121  Resp: 18  Temp: 98 F (36.7 C)  SpO2: 93%   KPS: 80. General: Alert, cooperative, pleasant, in no acute distress Head: Cushingoid EENT: No conjunctival injection or scleral icterus.  Lungs: Resp effort normal Cardiac: Regular rate Abdomen: Obese abdomen Skin: No rashes cyanosis or petechiae. Extremities: No clubbing or edema  Neurologic Exam: Mental Status: Awake, alert, attentive to examiner. Oriented to self and environment. Language is fluent with intact comprehension.  Cranial Nerves: Visual acuity is grossly normal. Visual fields are full. Extra-ocular movements intact. No ptosis. Face is symmetric Motor: Tone and bulk are normal. Power is full in both arms and legs. Reflexes are symmetric, no pathologic reflexes present.  Sensory: Intact to light touch Gait: Normal.   Labs: I have reviewed the data as listed    Component Value Date/Time   NA 134 (L) 07/11/2020 0519   K 4.3 07/11/2020 0519   CL 97 (L) 07/11/2020 0519   CO2 26 07/11/2020 0519   GLUCOSE 305 (H) 07/11/2020 0519   BUN 30 (H) 07/11/2020 0519   CREATININE 0.88 07/11/2020 0519   CALCIUM 9.0 07/11/2020 0519   PROT 7.0 07/07/2020 0552   ALBUMIN 3.5  07/07/2020 0552   AST 45 (H) 07/07/2020 0552   ALT 48 (H) 07/07/2020 0552   ALKPHOS 91 07/07/2020 0552   BILITOT 1.2 07/07/2020 0552   GFRNONAA >60 07/11/2020 0519   GFRAA >60 07/11/2020 0519   Lab Results  Component Value Date   WBC 12.5 (H) 07/11/2020   NEUTROABS 10.5 (H) 07/11/2020   HGB 15.3 (H) 07/11/2020   HCT 45.8 07/11/2020   MCV 96.2 07/11/2020   PLT 205 07/11/2020    Imaging:  EEG  Result Date: 07/06/2020 Lora Havens, MD     07/06/2020 11:28 AM Patient Name: Tara Aguilar MRN: [id number] Epilepsy Attending: Lora Havens Referring  Physician/Provider: Dr Daryel November Date: 07/05/2020 Duration: 24.39 minutes Patient history: 39 year old female with 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  EEG to evaluate for seizures. Level of alertness: Awake AEDs during EEG study: Keppra Technical aspects: This EEG study was done with scalp electrodes positioned according to the 10-20 International system of electrode placement. Electrical activity was acquired at a sampling rate of $Remov'500Hz'HmUGsX$  and reviewed with a high frequency filter of $RemoveB'70Hz'EPArEgoj$  and a low frequency filter of $RemoveB'1Hz'VEZWvJdn$ . EEG data were recorded continuously and digitally stored. Description: No clear posterior dominant rhythm was seen.  EEG showed condyle generalized more for 3 to 6 Hz theta-delta slowing.  Intermittent rhythmic 2 to 3 Hz delta slowing was also seen in left hemisphere.  Hyperventilation and photic stimulation were not performed.   ABNORMALITY -Continuous slow, generalized - Intermittent rhythmic delta slow, left hemisphere IMPRESSION: This study is suggestive of cortical dysfunction in left hemisphere likely secondary to underlying structural abnormality, cerebral mets, postictal state.  No seizures or epileptiform discharges were seen throughout the recording. Lora Havens   MR BRAIN W WO CONTRAST  Result Date: 07/05/2020 CLINICAL  DATA:  Brain/CNS neoplasm staging EXAM: MRI HEAD WITHOUT AND WITH CONTRAST TECHNIQUE: Multiplanar, multiecho pulse sequences of the brain and surrounding structures were obtained without and with intravenous contrast. CONTRAST:  22mL GADAVIST GADOBUTROL 1 MMOL/ML IV SOLN COMPARISON:  MRI 05/09/2020 and 05/10/2020 FINDINGS: Brain: Minimal increase in the size of the 2 larger enhancing lesions, which is favored to relate to treatment change given increased heterogeneity of enhancement of these lesions and areas of peripheral cystic change/necrosis. The smallest lesion in the high left frontoparietal region is smaller. Mild associated susceptibility artifact, compatible with internal hemorrhage. When accounting for differences in technique, similar surrounding exuberant T2/FLAIR hyperintense edema. Similar mild mass effect. Trace rightward midline shift. Basal cisterns are patent. No acute infarct.  No new acute hemorrhage.  No hydrocephalus. Vascular: Major proximal arterial flow voids at the skull base are maintained. Skull and upper cervical spine: No focal marrow replacing lesion. Sinuses/Orbits: Small inferior left maxillary sinus retention cyst. Otherwise, the sinuses are clear. Unremarkable orbits. Other: Small right mastoid effusion. IMPRESSION: 1. Minimal increase in the size of the larger 2 enhancing lesions, favored to reflect treatment change in the setting of interval radiation treatment. Decreased size of the smallest lesion in the high left frontoparietal region. No new enhancing lesions identified. 2. When accounting for differences in technique, similar associated surrounding edema and mass effect with trace rightward midline shift. No hydrocephalus. Electronically Signed   By: Margaretha Sheffield MD   On: 07/05/2020 11:37   VAS [place] UPPER EXTREMITY VENOUS DUPLEX  Result Date: 07/09/2020 UPPER VENOUS STUDY  Indications: Swelling Risk Factors: Cancer. Limitations: Body habitus, poor ultrasound/tissue  interface, bandages and open wound. Comparison Study: No prior studies. Performing Technologist: Oliver Hum RVT  Examination Guidelines: A complete evaluation includes B-mode imaging, spectral Doppler, color Doppler, and power Doppler as needed of all accessible portions of each vessel. Bilateral testing is considered an integral part of a complete examination. Limited examinations for reoccurring indications may be performed as noted.  Right Findings: +----------+------------+---------+-----------+----------+-------+ RIGHT     CompressiblePhasicitySpontaneousPropertiesSummary +----------+------------+---------+-----------+----------+-------+ IJV           Full       Yes       Yes                      +----------+------------+---------+-----------+----------+-------+  Subclavian    Full       Yes       Yes                      +----------+------------+---------+-----------+----------+-------+ Axillary      Full       Yes       Yes                      +----------+------------+---------+-----------+----------+-------+ Brachial      Full       Yes       Yes                      +----------+------------+---------+-----------+----------+-------+ Radial        Full                                          +----------+------------+---------+-----------+----------+-------+ Ulnar         Full                                          +----------+------------+---------+-----------+----------+-------+ Cephalic      Full                                          +----------+------------+---------+-----------+----------+-------+ Basilic       Full                                          +----------+------------+---------+-----------+----------+-------+  Left Findings: +----------+------------+---------+-----------+----------+-------+ LEFT      CompressiblePhasicitySpontaneousPropertiesSummary  +----------+------------+---------+-----------+----------+-------+ Subclavian    Full       Yes       Yes                      +----------+------------+---------+-----------+----------+-------+  Summary:  Right: No evidence of deep vein thrombosis in the upper extremity. No evidence of superficial vein thrombosis in the upper extremity.  Left: No evidence of thrombosis in the subclavian.  *See table(s) above for measurements and observations.  Diagnosing physician: Servando Snare MD Electronically signed by Servando Snare MD on 07/09/2020 at 1:02:34 PM.    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  Etiology of epileptic event was focal inflammation and cortical irritation from left frontal metastases and recent fractionated radiosurgery.   We recommended continuing Keppra $RemoveBeforeDEI'500mg'IDhNXQvdnRcSfWpM$  BID.    Decadron should decrease to $RemoveBef'3mg'MneSyCzTQi$  daily x1 week, then $RemoveBe'2mg'tlEUzuEzM$  daily x1 week, then $RemoveBe'1mg'sZkwLfERT$  daily x1 week, then stop.  She is having side effects incluing fluid retention, hyperglycemia requiring insulin, cushingoid appearance.  We spent twenty additional minutes teaching regarding the natural history, biology, and historical experience in the treatment of neurologic complications of cancer.   We appreciate the opportunity to participate in the care of Tomesha Te Hirsch.  We ask that Amalia Edgecombe return to clinic in 1 month following next brain MRI, or sooner as needed.  All questions were answered. The patient knows to  call the clinic with any problems, questions or concerns. No barriers to learning were detected.  The total time spent in the encounter was 40 minutes and more than 50% was on counseling and review of test results   Ventura Sellers, MD Medical Director of Neuro-Oncology [facility] [place] 07/19/20 3:16 PM

## 2020-07-20 ENCOUNTER — Telehealth: Payer: Self-pay | Admitting: Internal Medicine

## 2020-07-20 NOTE — Telephone Encounter (Signed)
Scheduled per 9/23 los. Unable to reach pt. Called multiple times and went straight to voicemail. Voicemail is full. Mailing pt appt calendar.

## 2020-07-26 ENCOUNTER — Other Ambulatory Visit: Payer: Self-pay | Admitting: Radiation Therapy

## 2020-07-30 ENCOUNTER — Telehealth: Payer: Self-pay | Admitting: Internal Medicine

## 2020-07-30 NOTE — Telephone Encounter (Signed)
Called pt per 10/1 sch msg - no answer. Left message with appt date and time

## 2020-07-31 ENCOUNTER — Telehealth: Payer: Self-pay | Admitting: Internal Medicine

## 2020-07-31 NOTE — Telephone Encounter (Signed)
Per 07/30/20 scheduling message called patient to reschedule appt on 10/29. Left Message.

## 2020-08-02 ENCOUNTER — Telehealth: Payer: Self-pay | Admitting: Internal Medicine

## 2020-08-02 NOTE — Telephone Encounter (Signed)
10/7 called patient and left message of later appointment time per patient request requested

## 2020-08-06 ENCOUNTER — Encounter: Payer: Self-pay | Admitting: Internal Medicine

## 2020-08-13 ENCOUNTER — Encounter: Payer: Self-pay | Admitting: Internal Medicine

## 2020-08-16 ENCOUNTER — Encounter: Payer: Self-pay | Admitting: Internal Medicine

## 2020-08-18 ENCOUNTER — Other Ambulatory Visit: Payer: Self-pay

## 2020-08-18 ENCOUNTER — Ambulatory Visit
Admission: RE | Admit: 2020-08-18 | Discharge: 2020-08-18 | Disposition: A | Discharge status: Home / Self Care | Payer: BC Managed Care – PPO | Source: Ambulatory Visit | Attending: Radiation Oncology | Admitting: Radiation Oncology

## 2020-08-18 DIAGNOSIS — C7931 Secondary malignant neoplasm of brain: Secondary | ICD-10-CM

## 2020-08-18 MED ORDER — GADOBENATE DIMEGLUMINE 529 MG/ML IV SOLN
20.0000 mL | Freq: Once | INTRAVENOUS | Status: AC | PRN
  Administered 2020-08-18: 20 mL via INTRAVENOUS

## 2020-08-20 ENCOUNTER — Inpatient Hospital Stay: Payer: BC Managed Care – PPO | Attending: Internal Medicine

## 2020-08-20 DIAGNOSIS — C7931 Secondary malignant neoplasm of brain: Secondary | ICD-10-CM | POA: Insufficient documentation

## 2020-08-20 DIAGNOSIS — F17210 Nicotine dependence, cigarettes, uncomplicated: Secondary | ICD-10-CM | POA: Insufficient documentation

## 2020-08-20 DIAGNOSIS — C3490 Malignant neoplasm of unspecified part of unspecified bronchus or lung: Secondary | ICD-10-CM | POA: Insufficient documentation

## 2020-08-22 ENCOUNTER — Encounter (HOSPITAL_COMMUNITY): Payer: Self-pay | Admitting: Emergency Medicine

## 2020-08-22 ENCOUNTER — Encounter: Payer: Self-pay | Admitting: Internal Medicine

## 2020-08-22 ENCOUNTER — Other Ambulatory Visit: Payer: Self-pay

## 2020-08-22 ENCOUNTER — Emergency Department (HOSPITAL_COMMUNITY)
Admission: EM | Admit: 2020-08-22 | Discharge: 2020-08-23 | Disposition: A | Discharge status: Home / Self Care | Payer: BC Managed Care – PPO | Attending: Emergency Medicine | Admitting: Emergency Medicine

## 2020-08-22 ENCOUNTER — Emergency Department (HOSPITAL_COMMUNITY): Payer: BC Managed Care – PPO

## 2020-08-22 DIAGNOSIS — G529 Cranial nerve disorder, unspecified: Secondary | ICD-10-CM | POA: Diagnosis not present

## 2020-08-22 DIAGNOSIS — Z794 Long term (current) use of insulin: Secondary | ICD-10-CM | POA: Insufficient documentation

## 2020-08-22 DIAGNOSIS — R7303 Prediabetes: Secondary | ICD-10-CM | POA: Insufficient documentation

## 2020-08-22 DIAGNOSIS — Z85828 Personal history of other malignant neoplasm of skin: Secondary | ICD-10-CM | POA: Insufficient documentation

## 2020-08-22 DIAGNOSIS — R569 Unspecified convulsions: Secondary | ICD-10-CM | POA: Diagnosis present

## 2020-08-22 DIAGNOSIS — F17210 Nicotine dependence, cigarettes, uncomplicated: Secondary | ICD-10-CM | POA: Insufficient documentation

## 2020-08-22 DIAGNOSIS — Z85118 Personal history of other malignant neoplasm of bronchus and lung: Secondary | ICD-10-CM | POA: Insufficient documentation

## 2020-08-22 LAB — CBC WITH DIFFERENTIAL/PLATELET
Abs Immature Granulocytes: 0.1 10*3/uL — ABNORMAL HIGH (ref 0.00–0.07)
Basophils Absolute: 0.1 10*3/uL (ref 0.0–0.1)
Basophils Relative: 1 %
Eosinophils Absolute: 0 10*3/uL (ref 0.0–0.5)
Eosinophils Relative: 0 %
HCT: 47.8 % — ABNORMAL HIGH (ref 36.0–46.0)
Hemoglobin: 16 g/dL — ABNORMAL HIGH (ref 12.0–15.0)
Immature Granulocytes: 1 %
Lymphocytes Relative: 10 %
Lymphs Abs: 1 10*3/uL (ref 0.7–4.0)
MCH: 32.9 pg (ref 26.0–34.0)
MCHC: 33.5 g/dL (ref 30.0–36.0)
MCV: 98.2 fL (ref 80.0–100.0)
Monocytes Absolute: 0.5 10*3/uL (ref 0.1–1.0)
Monocytes Relative: 5 %
Neutro Abs: 8 10*3/uL — ABNORMAL HIGH (ref 1.7–7.7)
Neutrophils Relative %: 83 %
Platelets: 256 10*3/uL (ref 150–400)
RBC: 4.87 MIL/uL (ref 3.87–5.11)
RDW: 13.7 % (ref 11.5–15.5)
WBC: 9.6 10*3/uL (ref 4.0–10.5)
nRBC: 0 % (ref 0.0–0.2)

## 2020-08-22 MED ORDER — DEXAMETHASONE SODIUM PHOSPHATE 10 MG/ML IJ SOLN
6.0000 mg | Freq: Once | INTRAMUSCULAR | Status: AC
  Administered 2020-08-22: 6 mg via INTRAVENOUS
  Filled 2020-08-22: qty 1

## 2020-08-22 MED ORDER — LEVETIRACETAM IN NACL 1000 MG/100ML IV SOLN
1000.0000 mg | Freq: Once | INTRAVENOUS | Status: AC
  Administered 2020-08-22: 1000 mg via INTRAVENOUS
  Filled 2020-08-22: qty 100

## 2020-08-22 MED ORDER — SODIUM CHLORIDE 0.9 % IV BOLUS
500.0000 mL | Freq: Once | INTRAVENOUS | Status: AC
  Administered 2020-08-22: 500 mL via INTRAVENOUS

## 2020-08-22 NOTE — ED Triage Notes (Addendum)
[facility] - pt c/o right sided tremors that started sometime tonight.

## 2020-08-23 LAB — COMPREHENSIVE METABOLIC PANEL
ALT: 61 U/L — ABNORMAL HIGH (ref 0–44)
AST: 27 U/L (ref 15–41)
Albumin: 3.6 g/dL (ref 3.5–5.0)
Alkaline Phosphatase: 84 U/L (ref 38–126)
Anion gap: 10 (ref 5–15)
BUN: 12 mg/dL (ref 6–20)
CO2: 27 mmol/L (ref 22–32)
Calcium: 9.6 mg/dL (ref 8.9–10.3)
Chloride: 100 mmol/L (ref 98–111)
Creatinine, Ser: 0.67 mg/dL (ref 0.44–1.00)
GFR, Estimated: >60 mL/min (ref >60)
Glucose, Bld: 223 mg/dL — ABNORMAL HIGH (ref 70–99)
Potassium: 4 mmol/L (ref 3.5–5.1)
Sodium: 137 mmol/L (ref 135–145)
Total Bilirubin: 0.5 mg/dL (ref 0.3–1.2)
Total Protein: 7.4 g/dL (ref 6.5–8.1)

## 2020-08-23 NOTE — Discharge Instructions (Addendum)
Please keep taking keppra Please resume your decadron at 1 mg three times a day until instructed otherwise by oncologist Return here for any worsening or if you change your mind about hospitalization

## 2020-08-23 NOTE — ED Notes (Signed)
Pt requesting not to have monitor on her at this time.

## 2020-08-23 NOTE — ED Provider Notes (Signed)
[facility] 2 EMERGENCY DEPARTMENT Provider Note   CSN: [id number] Arrival date & time: 08/22/20  2219     History Chief Complaint  Patient presents with  . Tremors    Tara Aguilar is a 39 y.o. female.  History obtained from patient and record review. 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 She is recently been tapering her Decadron. She had radiation in the past for the same. She states she has had some focal seizures in the past on her right side and she states this seems similar to that. She states is not quite as bad. She is shaking of her right hand and the right face and difficulty speaking.        Past Medical History:  Diagnosis Date  . Cancer (HCC)   . Depression   . Glucose intolerance 03/11/2019  . Obesity, Class III, BMI 40-49.9 (morbid obesity) (HCC) 03/11/2019    Patient Active Problem List   Diagnosis Date Noted  . 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 Brain metastasis (HCC) 07/04/2020  . Palliative care by specialist   . Lung cancer (HCC) 03/28/2020  . 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 Squamous cell lung cancer, unspecified laterality (HCC) 04/05/2019  . Goals of care, counseling/discussion 04/05/2019  . 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 Glucose intolerance 03/11/2019  . Obesity, Class III, BMI 40-49.9 (morbid obesity) (HCC) 03/11/2019  . Tobacco use 03/11/2019  . Dental abscess 03/07/2019  . 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 Depression     Past Surgical History:  Procedure Laterality Date  . CESAREAN SECTION    . IRRIGATION AND DEBRIDEMENT ABSCESS  03/15/2019   RETROPHARYNGEAL   . TONSILLECTOMY AND ADENOIDECTOMY N/A 03/15/2019   Procedure: IRRIGATION AND  DEBRIDEMENT OF RETROPHARYNGEAL ABSCESS;  Surgeon: Newman Pies, MD;  Location: [facility] OR;  Service: ENT;  Laterality: N/A;     OB History   No obstetric history on file.     Family History  Problem Relation Age of Onset  . Lung cancer Mother   . Hypertension Mother   . Leukemia Father   . Myelodysplastic syndrome Father     Social History   Tobacco Use  . Smoking status: Current Some Day Smoker    Packs/day: 1.00    Years: 25.00    Pack years: 25.00    Types: Cigarettes  . Smokeless tobacco: Never Used  Vaping Use  . Vaping Use: Never used  Substance Use Topics  . Alcohol use: Not Currently  . Drug use: Not Currently    Home Medications Prior to Admission medications   Medication Sig Start Date End Date Taking? Authorizing Provider  albuterol (PROVENTIL) (2.5 MG/3ML) 0.083% nebulizer solution Take 3 mLs (2.5 mg total) by nebulization every 2 (two) hours as needed for wheezing. 03/29/20   Shon Hale, MD  albuterol (VENTOLIN HFA) 108 (90 Base) MCG/ACT inhaler Inhale 2 puffs into the lungs every 6 (six) hours as needed for wheezing or shortness of breath. 03/29/20   Shon Hale, MD  blood glucose meter kit and supplies KIT Dispense based on patient and insurance preference. Use up to four times daily as directed. (FOR ICD-9 250.00, 250.01). 07/12/20   Darlin Drop, DO  dexamethasone (DECADRON)  1 MG tablet Take 3 tablets (3 mg total) by mouth daily. 07/19/20   Henreitta Leber, MD  gabapentin (NEURONTIN) 300 MG capsule Take 300 mg by mouth 2 (two) times daily. Patient not taking: Reported on 07/19/2020 07/16/20   [provider]  insulin aspart (NOVOLOG) 100 UNIT/ML FlexPen Inject 10 Units into the skin 3 (three) times daily with meals. 07/12/20   Darlin Drop, DO  insulin glargine (LANTUS) 100 UNIT/ML Solostar Pen Inject 50 Units into the skin daily. 07/12/20   Darlin Drop, DO  levETIRAcetam (KEPPRA) 500 MG tablet Take 1 tablet (500 mg total) by mouth 2 (two) times  daily. 07/12/20 09/10/20  Darlin Drop, DO    Allergies    Patient has no known allergies.  Review of Systems   Review of Systems  All other systems reviewed and are negative.   Physical Exam Updated Vital Signs BP 128/90 (BP Location: Left Arm)   Pulse (!) 126   Temp 98.5 F (36.9 C) (Oral)   Resp (!) 22   Ht 5\' 6"  (1.676 m)   Wt (!) 138.7 kg   SpO2 95%   BMI 49.35 kg/m   Physical Exam Vitals and nursing note reviewed.  Constitutional:      Appearance: She is well-developed.  HENT:     Head: Normocephalic and atraumatic.     Mouth/Throat:     Mouth: Mucous membranes are moist.     Pharynx: Oropharynx is clear.  Eyes:     Pupils: Pupils are equal, round, and reactive to light.  Cardiovascular:     Rate and Rhythm: Normal rate and regular rhythm.  Pulmonary:     Effort: No respiratory distress.     Breath sounds: No stridor.  Abdominal:     General: There is no distension.  Musculoskeletal:        General: No swelling or tenderness. Normal range of motion.     Cervical back: Normal range of motion.  Skin:    General: Skin is warm and dry.  Neurological:     Mental Status: She is alert and oriented to person, place, and time.     Cranial Nerves: Cranial nerve deficit (slurred speech, drooling out of right side of mouth) present.     Comments: Rhythmic movements of right hand and arm     ED Results / Procedures / Treatments   Labs (all labs ordered are listed, but only abnormal results are displayed) Labs Reviewed  CBC WITH DIFFERENTIAL/PLATELET - Abnormal; Notable for the following components:      Result Value   Hemoglobin 16.0 (*)    HCT 47.8 (*)    Neutro Abs 8.0 (*)    Abs Immature Granulocytes 0.10 (*)    All other components within normal limits  COMPREHENSIVE METABOLIC PANEL - Abnormal; Notable for the following components:   Glucose, Bld 223 (*)    ALT 61 (*)    All other components within normal limits  LEVETIRACETAM LEVEL     EKG None  Radiology CT Head Wo Contrast  Result Date: 08/23/2020 CLINICAL DATA:  Intracranial metastatic disease with right-sided tremors EXAM: CT HEAD WITHOUT CONTRAST TECHNIQUE: Contiguous axial images were obtained from the base of the skull through the vertex without intravenous contrast. COMPARISON:  None. FINDINGS: Brain: Persistent large amount of vasogenic edema within the left hemisphere, slightly worsened compared to MRI of 08/18/2020. There is rightward midline shift measuring 4 mm. No hydrocephalus. No acute hemorrhage. Vascular: No hyperdense  vessel or unexpected calcification. Skull: Normal. Negative for fracture or focal lesion. Sinuses/Orbits: No acute finding. Other: None. IMPRESSION: Slight worsening of left hemisphere vasogenic edema with new rightward midline shift of 4 mm. Electronically Signed   By: Ulyses Jarred M.D.   On: 08/23/2020 00:14    Procedures Procedures (including critical care time)  Medications Ordered in ED Medications  dexamethasone (DECADRON) injection 6 mg (6 mg Intravenous Given 08/22/20 2326)  levETIRAcetam (KEPPRA) IVPB 1000 mg/100 mL premix (0 mg Intravenous Stopped 08/22/20 2349)  sodium chloride 0.9 % bolus 500 mL (0 mLs Intravenous Stopped 08/22/20 2349)    ED Course  I have reviewed the triage vital signs and the nursing notes.  Pertinent labs & imaging results that were available during my care of the patient were reviewed by me and considered in my medical decision making (see chart for details).    MDM Rules/Calculators/A&P                         hee with likely recurrent seizures, resolved with keppra and steroids. Ct with worsening edema and midline shift. Will d/w oncology for any need for admission for same.  Attempted to consult dr. Delton Coombes however he did not answer his phone or return the calls. Shared decision making with the patient and she prefers to go home.  She has an appointment with her oncologist in 36 hours and  I am not sure that there is any utility to keep her here.  She will restart her Decadron.  She will return if any new or worsening symptoms. Final Clinical Impression(s) / ED Diagnoses Final diagnoses:  Focal seizure [facility])    Rx / DC Orders ED Discharge Orders    None       Osha Rane, Corene Cornea, MD 08/23/20 0230

## 2020-08-24 ENCOUNTER — Ambulatory Visit: Payer: BC Managed Care – PPO | Admitting: Internal Medicine

## 2020-08-24 ENCOUNTER — Other Ambulatory Visit: Payer: Self-pay

## 2020-08-24 ENCOUNTER — Inpatient Hospital Stay (HOSPITAL_BASED_OUTPATIENT_CLINIC_OR_DEPARTMENT_OTHER): Payer: BC Managed Care – PPO | Admitting: Internal Medicine

## 2020-08-24 VITALS — BP 158/109 | HR 115 | Temp 99.2°F | Resp 20 | Ht 66.0 in | Wt 288.1 lb

## 2020-08-24 DIAGNOSIS — R569 Unspecified convulsions: Secondary | ICD-10-CM | POA: Diagnosis not present

## 2020-08-24 DIAGNOSIS — C7931 Secondary malignant neoplasm of brain: Secondary | ICD-10-CM

## 2020-08-24 DIAGNOSIS — C3490 Malignant neoplasm of unspecified part of unspecified bronchus or lung: Secondary | ICD-10-CM | POA: Diagnosis not present

## 2020-08-24 DIAGNOSIS — F17210 Nicotine dependence, cigarettes, uncomplicated: Secondary | ICD-10-CM | POA: Diagnosis not present

## 2020-08-24 MED ORDER — LEVETIRACETAM 1000 MG PO TABS: 1000.0000 mg | ORAL_TABLET | Freq: Two times a day (BID) | ORAL | 3 refills | Status: AC

## 2020-08-24 MED ORDER — DEXAMETHASONE 4 MG PO TABS: 4.0000 mg | ORAL_TABLET | Freq: Every day | ORAL | Status: AC

## 2020-08-24 NOTE — Progress Notes (Signed)
[facility] at [facility], [place] [address] [phone]   Interval Evaluation  Date of Service: 08/24/20 Patient Name: Tara Aguilar Patient MRN: [id number] Patient DOB: [birth year] Provider: Ventura Sellers, MD  Identifying Statement:  Tara Aguilar is a 39 y.o. female with Brain metastasis ([place]) [C79.31]   Primary Cancer:  Oncologic History: Oncology History  Squamous cell lung cancer, unspecified laterality ([place])  04/05/2019 Initial Diagnosis   Squamous cell lung cancer, unspecified laterality ([place])   04/08/2019 -  Chemotherapy   The patient had palonosetron (ALOXI) injection 0.25 mg, 0.25 mg, Intravenous,  Once, 7 of 7 cycles Administration: 0.25 mg (04/08/2019), 0.25 mg (05/02/2019), 0.25 mg (05/23/2019), 0.25 mg (06/13/2019), 0.25 mg (07/05/2019), 0.25 mg (07/26/2019), 0.25 mg (08/16/2019) pegfilgrastim (NEULASTA) injection 6 mg, 6 mg, Subcutaneous, Once, 2 of 2 cycles Administration: 6 mg (04/11/2019), 6 mg (05/04/2019) pegfilgrastim (NEULASTA ONPRO KIT) injection 6 mg, 6 mg, Subcutaneous, Once, 3 of 3 cycles pegfilgrastim-cbqv (UDENYCA) injection 6 mg, 6 mg, Subcutaneous, Once, 5 of 5 cycles Administration: 6 mg (05/25/2019), 6 mg (06/15/2019), 6 mg (07/07/2019), 6 mg (07/28/2019), 6 mg (08/18/2019) CARBOplatin (PARAPLATIN) 900 mg in sodium chloride 0.9 % 500 mL chemo infusion, 900 mg (100 % of original dose 900 mg), Intravenous,  Once, 7 of 7 cycles Dose modification:   (original dose 900 mg, Cycle 1),   (original dose 900 mg, Cycle 2),   (original dose 900 mg, Cycle 3),   (original dose 900 mg, Cycle 4) Administration: 900 mg (04/08/2019), 900 mg (05/02/2019), 900 mg (05/23/2019), 900 mg (06/13/2019), 900 mg (07/05/2019), 900 mg (07/26/2019), 900 mg (08/16/2019) PACLitaxel (TAXOL) 492 mg in sodium chloride 0.9 % 500 mL chemo infusion (> $RemoveBef'80mg'vfYMZSmVJP$ /m2), 200 mg/m2 = 492 mg, Intravenous,  Once, 7 of 7 cycles Dose modification: 175 mg/m2 (original dose 200  mg/m2, Cycle 6, Reason: Other (see comments), Comment: neuropathy) Administration: 492 mg (04/08/2019), 492 mg (05/02/2019), 492 mg (05/23/2019), 492 mg (06/13/2019), 492 mg (07/05/2019), 432 mg (07/26/2019), 432 mg (08/16/2019) pembrolizumab (KEYTRUDA) 200 mg in sodium chloride 0.9 % 50 mL chemo infusion, 200 mg, Intravenous, Once, 18 of 21 cycles Administration: 200 mg (04/08/2019), 200 mg (07/05/2019), 200 mg (05/02/2019), 200 mg (05/23/2019), 200 mg (06/13/2019), 200 mg (07/26/2019), 200 mg (08/16/2019), 200 mg (09/06/2019), 200 mg (09/27/2019), 200 mg (10/19/2019), 200 mg (11/09/2019), 200 mg (11/30/2019), 200 mg (12/21/2019), 200 mg (01/12/2020), 200 mg (02/07/2020), 200 mg (02/28/2020), 200 mg (03/21/2020), 200 mg (04/11/2020) fosaprepitant (EMEND) 150 mg, dexamethasone (DECADRON) 12 mg in sodium chloride 0.9 % 145 mL IVPB, , Intravenous,  Once, 7 of 7 cycles Administration:  (04/08/2019),  (05/02/2019),  (05/23/2019),  (06/13/2019),  (07/05/2019),  (07/26/2019),  (08/16/2019)  for chemotherapy treatment.     CNS Oncologic Hx 05/24/20: Completes frx SRS to 3 left frontoparietal targets  Interval History:  Tara Aguilar presents today for evaluation after recent MRI brain.  She describes two recent seizures, characterized by right sided shaking, speech arrest, followed by slow return of motor and language function over 1-2 days.  These have both been in the past week.  Otherwise gait dysfunction has been persistent or somewhat progressed due to poor balance.  Currently dosing decadron $RemoveBeforeD'3mg'YRmnjSXQzAoqWQ$  daily after tapering down to $Remov'1mg'SWinqj$  during the past two weeks prior to the second seizure event.  H+P (9.23.21) Patient presented to medical attention this past month with new onset seizure.  Event was described as interrupted awareness, followed by right sided shaking for  several minutes.  After event there was confusion which persisted for most of the day, and resolved by discharge as patient returned to baseline.  Since going home on Keppra $RemoveB'500mg'hGNKQimO$   twice per day and decadron $RemoveBefo'4mg'bWmUXdOebUY$  daily, there have been no further seizure events.  Continues to experience some short term memory impairment and sluggishness following radiation therapy which was completed at the end of July 2021.  Soon will plan to resume Keytruda consolidation with Dr. Delton Coombes.   Medications: Current Outpatient Medications on File Prior to Visit  Medication Sig Dispense Refill  . albuterol (PROVENTIL) (2.5 MG/3ML) 0.083% nebulizer solution Take 3 mLs (2.5 mg total) by nebulization every 2 (two) hours as needed for wheezing. 75 mL 12  . albuterol (VENTOLIN HFA) 108 (90 Base) MCG/ACT inhaler Inhale 2 puffs into the lungs every 6 (six) hours as needed for wheezing or shortness of breath. 18 g 3  . blood glucose meter kit and supplies KIT Dispense based on patient and insurance preference. Use up to four times daily as directed. (FOR ICD-9 250.00, 250.01). 1 each 0  . dexamethasone (DECADRON) 1 MG tablet Take 3 tablets (3 mg total) by mouth daily. 90 tablet 1  . gabapentin (NEURONTIN) 300 MG capsule Take 300 mg by mouth 2 (two) times daily. (Patient not taking: Reported on 07/19/2020)    . insulin aspart (NOVOLOG) 100 UNIT/ML FlexPen Inject 10 Units into the skin 3 (three) times daily with meals. 15 mL 0  . insulin glargine (LANTUS) 100 UNIT/ML Solostar Pen Inject 50 Units into the skin daily. 15 mL 0  . levETIRAcetam (KEPPRA) 500 MG tablet Take 1 tablet (500 mg total) by mouth 2 (two) times daily. 120 tablet 0   No current facility-administered medications on file prior to visit.    Allergies: No Known Allergies Past Medical History:  Past Medical History:  Diagnosis Date  . Cancer ([place])   . Depression   . Glucose intolerance 03/11/2019  . Obesity, Class III, BMI 40-49.9 (morbid obesity) ([place]) 03/11/2019   Past Surgical History:  Past Surgical History:  Procedure Laterality Date  . CESAREAN SECTION    . IRRIGATION AND DEBRIDEMENT ABSCESS  03/15/2019   RETROPHARYNGEAL     . TONSILLECTOMY AND ADENOIDECTOMY N/A 03/15/2019   Procedure: IRRIGATION AND DEBRIDEMENT OF RETROPHARYNGEAL ABSCESS;  Surgeon: Leta Baptist, MD;  Location: [facility] OR;  Service: ENT;  Laterality: N/A;   Social History:  Social History   Socioeconomic History  . Marital status: Widowed    Spouse name: Not on file  . Number of children: 1  . Years of education: Not on file  . Highest education level: Not on file  Occupational History  . Not on file  Tobacco Use  . Smoking status: Current Some Day Smoker    Packs/day: 1.00    Years: 25.00    Pack years: 25.00    Types: Cigarettes  . Smokeless tobacco: Never Used  Vaping Use  . Vaping Use: Never used  Substance and Sexual Activity  . Alcohol use: Not Currently  . Drug use: Not Currently  . Sexual activity: Not on file  Other Topics Concern  . Not on file  Social History Narrative   03-27-19   Husband passed away four months ago from Cancer of the Ampula.   Pt. Has 40 yo son at home.   Social Determinants of Health   Financial Resource Strain:   . Difficulty of Paying Living Expenses: Not on file  Food Insecurity:   .  Worried About Charity fundraiser in the Last Year: Not on file  . Ran Out of Food in the Last Year: Not on file  Transportation Needs:   . Lack of Transportation (Medical): Not on file  . Lack of Transportation (Non-Medical): Not on file  Physical Activity:   . Days of Exercise per Week: Not on file  . Minutes of Exercise per Session: Not on file  Stress:   . Feeling of Stress : Not on file  Social Connections:   . Frequency of Communication with Friends and Family: Not on file  . Frequency of Social Gatherings with Friends and Family: Not on file  . Attends Religious Services: Not on file  . Active Member of Clubs or Organizations: Not on file  . Attends Archivist Meetings: Not on file  . Marital Status: Not on file  Intimate Partner Violence:   . Fear of Current or Ex-Partner: Not on file  .  Emotionally Abused: Not on file  . Physically Abused: Not on file  . Sexually Abused: Not on file   Family History:  Family History  Problem Relation Age of Onset  . Lung cancer Mother   . Hypertension Mother   . Leukemia Father   . Myelodysplastic syndrome Father     Review of Systems: Constitutional: Doesn't report fevers, chills or abnormal weight loss Eyes: Doesn't report blurriness of vision Ears, nose, mouth, throat, and face: Doesn't report sore throat Respiratory: Doesn't report cough, dyspnea or wheezes Cardiovascular: Doesn't report palpitation, chest discomfort  Gastrointestinal:  Doesn't report nausea, constipation, diarrhea GU: Doesn't report incontinence Skin: Doesn't report skin rashes Neurological: Per HPI Musculoskeletal: Doesn't report joint pain Behavioral/Psych: Doesn't report anxiety  Physical Exam: Vitals:   08/24/20 1458  BP: (!) 158/109  Pulse: (!) 115  Resp: 20  Temp: 99.2 F (37.3 C)  SpO2: 97%   KPS: 70. General: Alert, cooperative, pleasant, in no acute distress Head: Cushingoid EENT: No conjunctival injection or scleral icterus.  Lungs: Resp effort normal Cardiac: Regular rate Abdomen: Obese abdomen Skin: No rashes cyanosis or petechiae. Extremities: No clubbing or edema  Neurologic Exam: Mental Status: Awake, alert, attentive to examiner. Oriented to self and environment. Language is fluent with intact comprehension.  Cranial Nerves: Visual acuity is grossly normal. Visual fields are full. Extra-ocular movements intact. No ptosis. Face is symmetric Motor: Tone and bulk are normal. Power is full in both arms and legs. Reflexes are symmetric, no pathologic reflexes present.  Sensory: Intact to light touch Gait: Dystaxic  Labs: I have reviewed the data as listed    Component Value Date/Time   NA 137 08/22/2020 2320   K 4.0 08/22/2020 2320   CL 100 08/22/2020 2320   CO2 27 08/22/2020 2320   GLUCOSE 223 (H) 08/22/2020 2320   BUN  12 08/22/2020 2320   CREATININE 0.67 08/22/2020 2320   CALCIUM 9.6 08/22/2020 2320   PROT 7.4 08/22/2020 2320   ALBUMIN 3.6 08/22/2020 2320   AST 27 08/22/2020 2320   ALT 61 (H) 08/22/2020 2320   ALKPHOS 84 08/22/2020 2320   BILITOT 0.5 08/22/2020 2320   GFRNONAA >60 08/22/2020 2320   GFRAA >60 07/11/2020 0519   Lab Results  Component Value Date   WBC 9.6 08/22/2020   NEUTROABS 8.0 (H) 08/22/2020   HGB 16.0 (H) 08/22/2020   HCT 47.8 (H) 08/22/2020   MCV 98.2 08/22/2020   PLT 256 08/22/2020    Imaging:  CT Head Wo Contrast  Result Date: 08/23/2020 CLINICAL DATA:  Intracranial metastatic disease with right-sided tremors EXAM: CT HEAD WITHOUT CONTRAST TECHNIQUE: Contiguous axial images were obtained from the base of the skull through the vertex without intravenous contrast. COMPARISON:  None. FINDINGS: Brain: Persistent large amount of vasogenic edema within the left hemisphere, slightly worsened compared to MRI of 08/18/2020. There is rightward midline shift measuring 4 mm. No hydrocephalus. No acute hemorrhage. Vascular: No hyperdense vessel or unexpected calcification. Skull: Normal. Negative for fracture or focal lesion. Sinuses/Orbits: No acute finding. Other: None. IMPRESSION: Slight worsening of left hemisphere vasogenic edema with new rightward midline shift of 4 mm. Electronically Signed   By: Ulyses Jarred M.D.   On: 08/23/2020 00:14   MR Brain W Wo Contrast  Result Date: 08/18/2020 CLINICAL DATA:  Treated lung cancer metastatic to the brain. Surveillance. EXAM: MRI HEAD WITHOUT AND WITH CONTRAST TECHNIQUE: Multiplanar, multiecho pulse sequences of the brain and surrounding structures were obtained without and with intravenous contrast. CONTRAST:  74mL MULTIHANCE GADOBENATE DIMEGLUMINE 529 MG/ML IV SOLN COMPARISON:  07/05/2020 FINDINGS: BRAIN New Lesions: New areas of subarachnoid enhancement seen preferentially along the left cerebral convexity the level of the temporal,  posterior frontal, and parietal lobes. New small nodular enhancing focus at the tip of the left cerebellar tonsil which is likely leptomeningeal. There is also a canalicular and cisternal mass at the left CP angle cistern and IAC (12 x 6 mm). Larger lesions: Treated enhancing lesion located in the inferior and lateral left frontal parietal convexity has increased from 20 mm to now 28 mm, 12:90 . Stable or Smaller lesions: Twenty-two mm enhancing lesion located in the left parietal lobe and seen on 12:11. 1 cm lesion along the cortex of the posterior left frontal lobe on 12:124. Other Brain findings: Vasogenic edema is confluent in the posterior left cerebrum with local mass effect, non progressed. No acute infarct, hemorrhage, hydrocephalus, or collection. Chronic blood products associated with the parenchymal lesions. Vascular: Preserved flow voids and vascular enhancements Skull and upper cervical spine: Normal marrow signal Sinuses/Orbits: Negative These results will be called to the ordering clinician or representative by the Radiologist Assistant, and communication documented in the PACS or [employer]. IMPRESSION: 1. 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 2. Three treated parenchymal brain metastases with continued extensive vasogenic edema. The largest and most inferior has enlarged by 8 mm. The other 2 lesions are essentially stable. Electronically Signed   By: [name] Fantasia M.D.   On: 08/18/2020 14:00    Assessment/Plan Brain metastasis ([place]) [C79.31] Focal Seizures  Lilyana Lippman presents today with clinical and radiographic progression of disease.  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    Extensive discussion during brain/spine tumor board this week produced the following recommendations: -Recommend whole brain radiation for LMD,  understand patient at very high risk for inflammation within dominant hemisphere given recent radiosurgery -Given high risk and likelihood of motor/language deficits and additional seizures, recommend dosing Bevacizumab 10mg /kg IV q3 weeks x4 doses concurrent with planned Keytruda.  This can be given through Dr. Delton Coombes at [facility]. -Will plan to initiate WBRT once 1st cycle of avastin is completed  For now, will increase decadron to 4mg  daily and Keppra to 1000mg  BID.  She is agreeable to this plan, and understands that our aim is control extent of disease, not provide cure.   We will reach out  again to Dr. Delton Coombes and Dr. Lisbeth Renshaw to consolidate plans for treatment and radiation.  We would like to see her back in clinic during RT for clinical evaluation and to adjust corticosteroids as needed.  We appreciate the opportunity to participate in the care of Kahlan Te Lindfors.    All questions were answered. The patient knows to call the clinic with any problems, questions or concerns. No barriers to learning were detected.  The total time spent in the encounter was 40 minutes and more than 50% was on counseling and review of test results   Ventura Sellers, MD Medical Director of Neuro-Oncology [facility] at [place] 08/24/20 3:24 PM

## 2020-08-26 LAB — LEVETIRACETAM LEVEL: Levetiracetam Lvl: 5.1 ug/mL — ABNORMAL LOW (ref 10.0–40.0)

## 2020-08-27 ENCOUNTER — Telehealth: Payer: Self-pay | Admitting: Internal Medicine

## 2020-08-27 ENCOUNTER — Telehealth: Payer: Self-pay | Admitting: Radiation Oncology

## 2020-08-27 NOTE — Telephone Encounter (Signed)
No 10/29 los

## 2020-08-28 ENCOUNTER — Ambulatory Visit: Payer: BC Managed Care – PPO | Admitting: Internal Medicine

## 2020-08-28 ENCOUNTER — Other Ambulatory Visit (HOSPITAL_COMMUNITY): Payer: Self-pay | Admitting: Hematology

## 2020-09-05 ENCOUNTER — Encounter: Payer: Self-pay | Admitting: Radiation Oncology

## 2020-09-05 ENCOUNTER — Ambulatory Visit
Admission: RE | Admit: 2020-09-05 | Discharge: 2020-09-05 | Disposition: A | Discharge status: Home / Self Care | Payer: BC Managed Care – PPO | Source: Ambulatory Visit | Attending: Radiation Oncology | Admitting: Radiation Oncology

## 2020-09-05 ENCOUNTER — Ambulatory Visit
Admission: RE | Admit: 2020-09-05 | Discharge: 2020-09-05 | Disposition: A | Discharge status: Home / Self Care | Payer: BC Managed Care – PPO | Source: Ambulatory Visit

## 2020-09-05 ENCOUNTER — Other Ambulatory Visit: Payer: Self-pay

## 2020-09-05 VITALS — BP 121/63 | HR 100 | Resp 18 | Wt 288.0 lb

## 2020-09-05 DIAGNOSIS — F329 Major depressive disorder, single episode, unspecified: Secondary | ICD-10-CM | POA: Insufficient documentation

## 2020-09-05 DIAGNOSIS — Z79899 Other long term (current) drug therapy: Secondary | ICD-10-CM | POA: Insufficient documentation

## 2020-09-05 DIAGNOSIS — Z923 Personal history of irradiation: Secondary | ICD-10-CM | POA: Insufficient documentation

## 2020-09-05 DIAGNOSIS — C7931 Secondary malignant neoplasm of brain: Secondary | ICD-10-CM | POA: Insufficient documentation

## 2020-09-05 DIAGNOSIS — E669 Obesity, unspecified: Secondary | ICD-10-CM | POA: Diagnosis not present

## 2020-09-05 DIAGNOSIS — C7949 Secondary malignant neoplasm of other parts of nervous system: Secondary | ICD-10-CM

## 2020-09-05 DIAGNOSIS — Z7952 Long term (current) use of systemic steroids: Secondary | ICD-10-CM | POA: Diagnosis not present

## 2020-09-05 DIAGNOSIS — Z510 Encounter for antineoplastic radiation therapy: Secondary | ICD-10-CM | POA: Insufficient documentation

## 2020-09-05 DIAGNOSIS — C3411 Malignant neoplasm of upper lobe, right bronchus or lung: Secondary | ICD-10-CM | POA: Diagnosis not present

## 2020-09-05 DIAGNOSIS — C3490 Malignant neoplasm of unspecified part of unspecified bronchus or lung: Secondary | ICD-10-CM

## 2020-09-05 DIAGNOSIS — F17210 Nicotine dependence, cigarettes, uncomplicated: Secondary | ICD-10-CM | POA: Diagnosis not present

## 2020-09-05 DIAGNOSIS — Z801 Family history of malignant neoplasm of trachea, bronchus and lung: Secondary | ICD-10-CM | POA: Insufficient documentation

## 2020-09-05 NOTE — Progress Notes (Addendum)
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  She has been managed under the care of medical oncology and [place], and was found to have 3 lesions in the brain over the summer for which she received stereotactic radiosurgery.  She has been followed since that time and unfortunately her most recent imaging revealed concern for possible progressive disease, she is also developed seizures and has been followed by Dr. Mickeal Skinner as a result.  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  Her case has been discussed in multiple brain oncology conferences, and it is been felt that she would benefit from the combination of Avastin for her vasogenic edema along with whole brain radiation to treat her leptomeningeal disease.  She is scheduled for Avastin tomorrow and comes today to discuss options of treatment.  PAST MEDICAL HISTORY:  Past Medical History:  Diagnosis Date  . Cancer ([place])   . Depression   . Glucose intolerance 03/11/2019  . Obesity, Class III, BMI 40-49.9 (morbid obesity) ([place])  03/11/2019    PAST SURGICAL HISTORY: Past Surgical History:  Procedure Laterality Date  . CESAREAN SECTION    . IRRIGATION AND DEBRIDEMENT ABSCESS  03/15/2019   RETROPHARYNGEAL   . TONSILLECTOMY AND ADENOIDECTOMY N/A 03/15/2019   Procedure: IRRIGATION AND DEBRIDEMENT OF RETROPHARYNGEAL ABSCESS;  Surgeon: Leta Baptist, MD;  Location: [facility];  Service: ENT;  Laterality: N/A;    PAST SOCIAL HISTORY:  Social History   Socioeconomic History  . Marital status: Widowed    Spouse name: Not on file  . Number of children: 1  . Years of education: Not on file  . Highest education level: Not on file  Occupational History  . Not on file  Tobacco Use  . Smoking status: Current Some Day Smoker    Packs/day: 1.00    Years: 25.00    Pack years: 25.00    Types: Cigarettes  . Smokeless tobacco: Never Used  Vaping Use  . Vaping Use: Never used  Substance and Sexual Activity  . Alcohol use: Not Currently  . Drug use: Not Currently  . Sexual activity: Not on file  Other Topics Concern  . Not on file  Social History Narrative   03/16/19   Husband passed away four months ago from Cancer of the [place].   Pt. Has 22 yo son at home.   Social Determinants of Health   Financial Resource Strain:   . Difficulty of Paying Living Expenses: Not on file  Food Insecurity:   . Worried About Charity fundraiser in the Last Year: Not on file  . Ran Out  of Food in the Last Year: Not on file  Transportation Needs:   . Lack of Transportation (Medical): Not on file  . Lack of Transportation (Non-Medical): Not on file  Physical Activity:   . Days of Exercise per Week: Not on file  . Minutes of Exercise per Session: Not on file  Stress:   . Feeling of Stress : Not on file  Social Connections:   . Frequency of Communication with Friends and Family: Not on file  . Frequency of Social Gatherings with Friends and Family: Not on file  . Attends Religious Services: Not on file  . Active Member of Clubs or  Organizations: Not on file  . Attends Archivist Meetings: Not on file  . Marital Status: Not on file  Intimate Partner Violence:   . Fear of Current or Ex-Partner: Not on file  . Emotionally Abused: Not on file  . Physically Abused: Not on file  . Sexually Abused: Not on file    PAST FAMILY HISTORY: Family History  Problem Relation Age of Onset  . Lung cancer Mother   . Hypertension Mother   . Leukemia Father   . Myelodysplastic syndrome Father     MEDICATIONS  Current Outpatient Medications  Medication Sig Dispense Refill  . albuterol (PROVENTIL) (2.5 MG/3ML) 0.083% nebulizer solution Take 3 mLs (2.5 mg total) by nebulization every 2 (two) hours as needed for wheezing. 75 mL 12  . albuterol (VENTOLIN HFA) 108 (90 Base) MCG/ACT inhaler Inhale 2 puffs into the lungs every 6 (six) hours as needed for wheezing or shortness of breath. 18 g 3  . blood glucose meter kit and supplies KIT Dispense based on patient and insurance preference. Use up to four times daily as directed. (FOR ICD-9 250.00, 250.01). 1 each 0  . dexamethasone (DECADRON) 1 MG tablet Take by mouth.    . gabapentin (NEURONTIN) 300 MG capsule Take 300 mg by mouth 2 (two) times daily.     . insulin aspart (NOVOLOG) 100 UNIT/ML FlexPen Inject 10 Units into the skin 3 (three) times daily with meals. 15 mL 0  . levETIRAcetam (KEPPRA) 1000 MG tablet Take 1 tablet (1,000 mg total) by mouth 2 (two) times daily. 60 tablet 3  . dexamethasone (DECADRON) 4 MG tablet Take 1 tablet (4 mg total) by mouth daily. (Patient not taking: Reported on 09/05/2020)    . insulin glargine (LANTUS) 100 UNIT/ML Solostar Pen Inject 50 Units into the skin daily. (Patient not taking: Reported on 09/05/2020) 15 mL 0   No current facility-administered medications for this encounter.    ALLERGIES: No Known Allergies   Physical Exam: Wt Readings from Last 3 Encounters:  09/05/20 288 lb (130.6 kg)  08/24/20 288 lb 1.6 oz (130.7 kg)   08/22/20 (!) 305 lb 12.5 oz (138.7 kg)   Temp Readings from Last 3 Encounters:  08/24/20 99.2 F (37.3 C) (Tympanic)  08/23/20 98.2 F (36.8 C) (Oral)  07/19/20 98 F (36.7 C) (Tympanic)   BP Readings from Last 3 Encounters:  09/05/20 121/63  08/24/20 (!) 158/109  08/23/20 134/86   Pulse Readings from Last 3 Encounters:  09/05/20 100  08/24/20 (!) 115  08/23/20 (!) 104   In general this is a chronically ill  appearing caucasian female in no acute distress. She's alert and oriented x4 and appropriate throughout the examination. Cardiopulmonary assessment is negative for acute distress and she exhibits normal effort.    Impression/Plan: 1. Progressive Stage IV, NSCLC, SCC of the  RUL with brain metastases now with concerns for leptomeningeal disease. Dr. Lisbeth Renshaw discusses the pathology findings and reviews the nature of leptomeningeal disease and reviews the discussion that has been had in brain oncology conference and with her neuro oncologist and medical oncologist. She plans to receive Avastin tomorrow along with radiotherapy and we reviewed the rationale for whole brain radiation. We discussed the risks, benefits, short, and long term effects of radiotherapy, and the patient is interested in proceeding. Dr. Lisbeth Renshaw discusses the delivery and logistics of radiotherapy and anticipates a course of 2 weeks of radiotherapy to the brain. Written consent is obtained and placed in the chart, a copy was provided to the patient. She will simulate today. 2. Goals of care.  The patient is interested in treatments that will help maintain quality of life and reduce progressive disease if possible but is also very realistic about her overall prognosis.  She has already completed a MOST form with her last hospitalization with Dr. Domingo Cocking and has elected for DO NOT RESUSCITATE status.  She is interested in having outpatient palliative care and we will coordinate for referral to care connections through hospice  of the [place].  In a visit lasting 45 minutes, greater than 50% of the time was spent face to face discussing the patient's condition, in preparation for the discussion, and coordinating the patient's care.     Carola Rhine, PAC

## 2020-09-06 ENCOUNTER — Inpatient Hospital Stay (HOSPITAL_COMMUNITY): Payer: BC Managed Care – PPO | Attending: Hematology | Admitting: Hematology

## 2020-09-06 ENCOUNTER — Inpatient Hospital Stay (HOSPITAL_COMMUNITY): Payer: BC Managed Care – PPO

## 2020-09-06 VITALS — BP 135/89 | HR 99 | Temp 96.9°F

## 2020-09-06 VITALS — BP 140/94 | HR 106 | Temp 96.8°F | Resp 20 | Wt 303.0 lb

## 2020-09-06 DIAGNOSIS — Z5112 Encounter for antineoplastic immunotherapy: Secondary | ICD-10-CM | POA: Insufficient documentation

## 2020-09-06 DIAGNOSIS — G629 Polyneuropathy, unspecified: Secondary | ICD-10-CM | POA: Diagnosis not present

## 2020-09-06 DIAGNOSIS — Z7952 Long term (current) use of systemic steroids: Secondary | ICD-10-CM | POA: Insufficient documentation

## 2020-09-06 DIAGNOSIS — Z7901 Long term (current) use of anticoagulants: Secondary | ICD-10-CM | POA: Diagnosis not present

## 2020-09-06 DIAGNOSIS — I82612 Acute embolism and thrombosis of superficial veins of left upper extremity: Secondary | ICD-10-CM | POA: Insufficient documentation

## 2020-09-06 DIAGNOSIS — C7931 Secondary malignant neoplasm of brain: Secondary | ICD-10-CM

## 2020-09-06 DIAGNOSIS — C3490 Malignant neoplasm of unspecified part of unspecified bronchus or lung: Secondary | ICD-10-CM | POA: Diagnosis not present

## 2020-09-06 DIAGNOSIS — Z801 Family history of malignant neoplasm of trachea, bronchus and lung: Secondary | ICD-10-CM | POA: Insufficient documentation

## 2020-09-06 DIAGNOSIS — Z79899 Other long term (current) drug therapy: Secondary | ICD-10-CM | POA: Insufficient documentation

## 2020-09-06 DIAGNOSIS — E079 Disorder of thyroid, unspecified: Secondary | ICD-10-CM | POA: Insufficient documentation

## 2020-09-06 DIAGNOSIS — F17210 Nicotine dependence, cigarettes, uncomplicated: Secondary | ICD-10-CM | POA: Insufficient documentation

## 2020-09-06 LAB — URINALYSIS, DIPSTICK ONLY
Bilirubin Urine: NEGATIVE
Glucose, UA: 50 mg/dL — AB
Hgb urine dipstick: NEGATIVE
Ketones, ur: NEGATIVE mg/dL
Leukocytes,Ua: NEGATIVE
Nitrite: NEGATIVE
Protein, ur: NEGATIVE mg/dL
Specific Gravity, Urine: 1.012 (ref 1.005–1.030)
pH: 6 (ref 5.0–8.0)

## 2020-09-06 LAB — CBC WITH DIFFERENTIAL/PLATELET
Abs Immature Granulocytes: 0.21 10*3/uL — ABNORMAL HIGH (ref 0.00–0.07)
Basophils Absolute: 0.1 10*3/uL (ref 0.0–0.1)
Basophils Relative: 1 %
Eosinophils Absolute: 0.1 10*3/uL (ref 0.0–0.5)
Eosinophils Relative: 1 %
HCT: 47.6 % — ABNORMAL HIGH (ref 36.0–46.0)
Hemoglobin: 15.1 g/dL — ABNORMAL HIGH (ref 12.0–15.0)
Immature Granulocytes: 2 %
Lymphocytes Relative: 14 %
Lymphs Abs: 1.5 10*3/uL (ref 0.7–4.0)
MCH: 32.5 pg (ref 26.0–34.0)
MCHC: 31.7 g/dL (ref 30.0–36.0)
MCV: 102.6 fL — ABNORMAL HIGH (ref 80.0–100.0)
Monocytes Absolute: 0.6 10*3/uL (ref 0.1–1.0)
Monocytes Relative: 5 %
Neutro Abs: 8.7 10*3/uL — ABNORMAL HIGH (ref 1.7–7.7)
Neutrophils Relative %: 77 %
Platelets: 178 10*3/uL (ref 150–400)
RBC: 4.64 MIL/uL (ref 3.87–5.11)
RDW: 13.9 % (ref 11.5–15.5)
WBC: 11.2 10*3/uL — ABNORMAL HIGH (ref 4.0–10.5)
nRBC: 0 % (ref 0.0–0.2)

## 2020-09-06 LAB — COMPREHENSIVE METABOLIC PANEL
ALT: 103 U/L — ABNORMAL HIGH (ref 0–44)
AST: 42 U/L — ABNORMAL HIGH (ref 15–41)
Albumin: 3.5 g/dL (ref 3.5–5.0)
Alkaline Phosphatase: 78 U/L (ref 38–126)
Anion gap: 10 (ref 5–15)
BUN: 26 mg/dL — ABNORMAL HIGH (ref 6–20)
CO2: 28 mmol/L (ref 22–32)
Calcium: 9.2 mg/dL (ref 8.9–10.3)
Chloride: 101 mmol/L (ref 98–111)
Creatinine, Ser: 0.83 mg/dL (ref 0.44–1.00)
GFR, Estimated: >60 mL/min (ref >60)
Glucose, Bld: 152 mg/dL — ABNORMAL HIGH (ref 70–99)
Potassium: 3.9 mmol/L (ref 3.5–5.1)
Sodium: 139 mmol/L (ref 135–145)
Total Bilirubin: 0.6 mg/dL (ref 0.3–1.2)
Total Protein: 6.7 g/dL (ref 6.5–8.1)

## 2020-09-06 LAB — TSH: TSH: 9.355 u[IU]/mL — ABNORMAL HIGH (ref 0.350–4.500)

## 2020-09-06 MED ORDER — SODIUM CHLORIDE 0.9 % IV SOLN: Freq: Once | INTRAVENOUS | Status: AC

## 2020-09-06 MED ORDER — SODIUM CHLORIDE 0.9 % IV SOLN
10.0000 mg/kg | Freq: Once | INTRAVENOUS | Status: AC
  Administered 2020-09-06: 1300 mg via INTRAVENOUS
  Filled 2020-09-06: qty 48

## 2020-09-06 NOTE — Progress Notes (Signed)
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  PRIOR THERAPY: Carboplatin, paclitaxel and Keytruda x 7 cycles from 04/08/2019 to 08/16/2019; Keytruda x 18 cycles to 04/11/2020  NGS Results: Not done  CURRENT THERAPY: Under work-up  BRIEF ONCOLOGIC HISTORY:  Oncology History  Squamous cell lung cancer, unspecified laterality ([place])  04/05/2019 Initial Diagnosis   Squamous cell lung cancer, unspecified laterality ([place])   04/08/2019 -  Chemotherapy   The patient had palonosetron (ALOXI) injection 0.25 mg, 0.25 mg, Intravenous,  Once, 7 of 7 cycles Administration: 0.25 mg (04/08/2019), 0.25 mg (05/02/2019), 0.25 mg (05/23/2019), 0.25 mg (06/13/2019), 0.25 mg (07/05/2019), 0.25 mg (07/26/2019), 0.25 mg (08/16/2019) pegfilgrastim (NEULASTA) injection 6 mg, 6 mg, Subcutaneous, Once, 2 of 2 cycles Administration: 6 mg (04/11/2019), 6 mg (05/04/2019) pegfilgrastim (NEULASTA ONPRO KIT) injection 6 mg, 6 mg, Subcutaneous, Once, 3 of 3 cycles pegfilgrastim-cbqv (UDENYCA) injection 6 mg, 6 mg, Subcutaneous, Once, 5 of 5 cycles Administration: 6 mg (05/25/2019), 6 mg (06/15/2019), 6 mg (07/07/2019), 6 mg (07/28/2019), 6 mg (08/18/2019) CARBOplatin (PARAPLATIN) 900 mg in sodium chloride 0.9 % 500 mL chemo infusion, 900 mg (100 % of original dose 900 mg), Intravenous,  Once, 7 of 7 cycles Dose modification:   (original dose 900 mg, Cycle 1),   (original dose 900 mg, Cycle 2),   (original dose 900 mg, Cycle 3),   (original dose 900 mg, Cycle 4) Administration: 900 mg (04/08/2019), 900 mg (05/02/2019), 900 mg (05/23/2019), 900 mg (06/13/2019), 900 mg (07/05/2019), 900 mg (07/26/2019), 900 mg (08/16/2019) PACLitaxel (TAXOL) 492 mg in sodium chloride 0.9 % 500 mL chemo infusion (> $RemoveBef'80mg'AikvjhICuL$ /m2), 200 mg/m2 = 492 mg,  Intravenous,  Once, 7 of 7 cycles Dose modification: 175 mg/m2 (original dose 200 mg/m2, Cycle 6, Reason: Other (see comments), Comment: neuropathy) Administration: 492 mg (04/08/2019), 492 mg (05/02/2019), 492 mg (05/23/2019), 492 mg (06/13/2019), 492 mg (07/05/2019), 432 mg (07/26/2019), 432 mg (08/16/2019) pembrolizumab (KEYTRUDA) 200 mg in sodium chloride 0.9 % 50 mL chemo infusion, 200 mg, Intravenous, Once, 18 of 21 cycles Administration: 200 mg (04/08/2019), 200 mg (07/05/2019), 200 mg (05/02/2019), 200 mg (05/23/2019), 200 mg (06/13/2019), 200 mg (07/26/2019), 200 mg (08/16/2019), 200 mg (09/06/2019), 200 mg (09/27/2019), 200 mg (10/19/2019), 200 mg (11/09/2019), 200 mg (11/30/2019), 200 mg (12/21/2019), 200 mg (01/12/2020), 200 mg (02/07/2020), 200 mg (02/28/2020), 200 mg (03/21/2020), 200 mg (04/11/2020) fosaprepitant (EMEND) 150 mg, dexamethasone (DECADRON) 12 mg in sodium chloride 0.9 % 145 mL IVPB, , Intravenous,  Once, 7 of 7 cycles Administration:  (04/08/2019),  (05/02/2019),  (05/23/2019),  (06/13/2019),  (07/05/2019),  (07/26/2019),  (08/16/2019)  for chemotherapy treatment.    09/06/2020 -  Chemotherapy   The patient had bevacizumab-bvzr (ZIRABEV) 1,300 mg in sodium chloride 0.9 % 100 mL chemo infusion, 10 mg/kg, Intravenous,  Once, 0 of 6 cycles  for chemotherapy treatment.    Brain metastasis ([place])  07/04/2020 Initial Diagnosis   Brain metastasis ([place])   09/06/2020 -  Chemotherapy   The patient had bevacizumab-bvzr (ZIRABEV) 1,300 mg in sodium chloride 0.9 % 100 mL chemo infusion, 10 mg/kg, Intravenous,  Once, 0 of 6 cycles  for chemotherapy treatment.      CANCER STAGING: Cancer Staging No matching staging information was found for the patient.  INTERVAL HISTORY:  [name], a 39 y.o. female, returns for routine follow-up and consideration for first cycle of immunotherapy. Kayde was last seen on 06/14/2020.  Due for initiating cycle #1 of bevacizumab today.   Overall, she tells me she has  been feeling tired. She is using a cane now to ambulate, but denies falling. She is taking Decadron 4 mg and Keppra 4 grams daily. She will start radiation next week and had the cast done today. She denies having any pain currently. Her appetite is decreased most days and she is sleepy most days. She can sometimes feel the seizure starting to come on in her face and right hand, which will start twitching, and she can sometimes control by controlling her breathing. She reports getting occasional leg swelling. She denies having diarrhea and is currently constipated. She is not currently on BP meds and she does not check her BP at home.  She will see Dr. Mickeal Skinner in the next 2 weeks.   Overall, she feels ready for first cycle of immunotherapy today.    REVIEW OF SYSTEMS:  Review of Systems  Constitutional: Positive for appetite change (50%) and fatigue (depleted).  Cardiovascular: Positive for leg swelling (occasional).  Gastrointestinal: Positive for constipation. Negative for diarrhea.  Neurological: Positive for numbness (hands).  All other systems reviewed and are negative.   PAST MEDICAL/SURGICAL HISTORY:  Past Medical History:  Diagnosis Date  . Cancer ([place])   . Depression   . Glucose intolerance 03/11/2019  . Obesity, Class III, BMI 40-49.9 (morbid obesity) ([place]) 03/11/2019   Past Surgical History:  Procedure Laterality Date  . CESAREAN SECTION    . IRRIGATION AND DEBRIDEMENT ABSCESS  03/15/2019   RETROPHARYNGEAL   . TONSILLECTOMY AND ADENOIDECTOMY N/A 03/15/2019   Procedure: IRRIGATION AND DEBRIDEMENT OF RETROPHARYNGEAL ABSCESS;  Surgeon: Leta Baptist, MD;  Location: [facility];  Service: ENT;  Laterality: N/A;    SOCIAL HISTORY:  Social History   Socioeconomic History  . Marital status: Widowed    Spouse name: Not on file  . Number of children: 1  . Years of education: Not on file  . Highest education level: Not on file  Occupational History  . Not on file  Tobacco Use  . Smoking  status: Current Some Day Smoker    Packs/day: 1.00    Years: 25.00    Pack years: 25.00    Types: Cigarettes  . Smokeless tobacco: Never Used  Vaping Use  . Vaping Use: Never used  Substance and Sexual Activity  . Alcohol use: Not Currently  . Drug use: Not Currently  . Sexual activity: Not on file  Other Topics Concern  . Not on file  Social History Narrative   04/03/19   Husband passed away four months ago from Cancer of the [place].   Pt. Has 79 yo son at home.   Social Determinants of Health   Financial Resource Strain:   . Difficulty of Paying Living Expenses: Not on file  Food Insecurity:   . Worried About Charity fundraiser in the Last Year: Not on file  . Ran Out of Food in the Last Year: Not on file  Transportation Needs:   . Lack of Transportation (Medical): Not on file  . Lack of Transportation (Non-Medical): Not on file  Physical Activity:   . Days of Exercise per Week: Not on file  . Minutes of Exercise per Session: Not on file  Stress:   . Feeling of Stress : Not on  file  Social Connections:   . Frequency of Communication with Friends and Family: Not on file  . Frequency of Social Gatherings with Friends and Family: Not on file  . Attends Religious Services: Not on file  . Active Member of Clubs or Organizations: Not on file  . Attends Archivist Meetings: Not on file  . Marital Status: Not on file  Intimate Partner Violence:   . Fear of Current or Ex-Partner: Not on file  . Emotionally Abused: Not on file  . Physically Abused: Not on file  . Sexually Abused: Not on file    FAMILY HISTORY:  Family History  Problem Relation Age of Onset  . Lung cancer Mother   . Hypertension Mother   . Leukemia Father   . Myelodysplastic syndrome Father     CURRENT MEDICATIONS:  Current Outpatient Medications  Medication Sig Dispense Refill  . blood glucose meter kit and supplies KIT Dispense based on patient and insurance preference. Use up to four  times daily as directed. (FOR ICD-9 250.00, 250.01). 1 each 0  . dexamethasone (DECADRON) 1 MG tablet Take by mouth.    . dexamethasone (DECADRON) 4 MG tablet Take 1 tablet (4 mg total) by mouth daily.    [name] gabapentin (NEURONTIN) 300 MG capsule Take 300 mg by mouth 2 (two) times daily.     . insulin aspart (NOVOLOG) 100 UNIT/ML FlexPen Inject 10 Units into the skin 3 (three) times daily with meals. 15 mL 0  . insulin glargine (LANTUS) 100 UNIT/ML Solostar Pen Inject 50 Units into the skin daily. 15 mL 0  . levETIRAcetam (KEPPRA) 1000 MG tablet Take 1 tablet (1,000 mg total) by mouth 2 (two) times daily. 60 tablet 3  . albuterol (PROVENTIL) (2.5 MG/3ML) 0.083% nebulizer solution Take 3 mLs (2.5 mg total) by nebulization every 2 (two) hours as needed for wheezing. (Patient not taking: Reported on 09/06/2020) 75 mL 12  . albuterol (VENTOLIN HFA) 108 (90 Base) MCG/ACT inhaler Inhale 2 puffs into the lungs every 6 (six) hours as needed for wheezing or shortness of breath. (Patient not taking: Reported on 09/06/2020) 18 g 3   No current facility-administered medications for this visit.    ALLERGIES:  No Known Allergies  PHYSICAL EXAM:  Performance status (ECOG): 2 - Symptomatic, <50% confined to bed  Vitals:   09/06/20 0803  BP: (!) 140/94  Pulse: (!) 106  Resp: 20  Temp: (!) 96.8 F (36 C)  SpO2: 95%   Wt Readings from Last 3 Encounters:  09/06/20 (!) 303 lb (137.4 kg)  09/05/20 288 lb (130.6 kg)  08/24/20 288 lb 1.6 oz (130.7 kg)   Physical Exam Vitals reviewed.  Constitutional:      Appearance: Normal appearance. She is obese.  Cardiovascular:     Rate and Rhythm: Normal rate and regular rhythm.     Pulses: Normal pulses.     Heart sounds: Normal heart sounds.  Pulmonary:     Effort: Pulmonary effort is normal.     Breath sounds: Normal breath sounds.  Musculoskeletal:     Right lower leg: Edema (trace) present.     Left lower leg: Edema (trace) present.  Neurological:      General: No focal deficit present.     Mental Status: She is alert and oriented to person, place, and time.  Psychiatric:        Mood and Affect: Mood normal.        Behavior: Behavior normal.  LABORATORY DATA:  I have reviewed the labs as listed.  CBC Latest Ref Rng & Units 08/22/2020 07/11/2020 07/07/2020  WBC 4.0 - 10.5 K/uL 9.6 12.5(H) 10.7(H)  Hemoglobin 12.0 - 15.0 g/dL 16.0(H) 15.3(H) 14.8  Hematocrit 36 - 46 % 47.8(H) 45.8 45.0  Platelets 150 - 400 K/uL 256 205 256   CMP Latest Ref Rng & Units 08/22/2020 07/11/2020 07/08/2020  Glucose 70 - 99 mg/dL 223(H) 305(H) 297(H)  BUN 6 - 20 mg/dL 12 30(H) 27(H)  Creatinine 0.44 - 1.00 mg/dL 0.67 0.88 0.76  Sodium 135 - 145 mmol/L 137 134(L) 137  Potassium 3.5 - 5.1 mmol/L 4.0 4.3 4.5  Chloride 98 - 111 mmol/L 100 97(L) 96(L)  CO2 22 - 32 mmol/L $RemoveB'27 26 31  'sRkAMFAH$ Calcium 8.9 - 10.3 mg/dL 9.6 9.0 9.6  Total Protein 6.5 - 8.1 g/dL 7.4 - -  Total Bilirubin 0.3 - 1.2 mg/dL 0.5 - -  Alkaline Phos 38 - 126 U/L 84 - -  AST 15 - 41 U/L 27 - -  ALT 0 - 44 U/L 61(H) - -    DIAGNOSTIC IMAGING:  I have independently reviewed the scans and discussed with the patient. CT Head Wo Contrast  Result Date: 08/23/2020 CLINICAL DATA:  Intracranial metastatic disease with right-sided tremors EXAM: CT HEAD WITHOUT CONTRAST TECHNIQUE: Contiguous axial images were obtained from the base of the skull through the vertex without intravenous contrast. COMPARISON:  None. FINDINGS: Brain: Persistent large amount of vasogenic edema within the left hemisphere, slightly worsened compared to MRI of 08/18/2020. There is rightward midline shift measuring 4 mm. No hydrocephalus. No acute hemorrhage. Vascular: No hyperdense vessel or unexpected calcification. Skull: Normal. Negative for fracture or focal lesion. Sinuses/Orbits: No acute finding. Other: None. IMPRESSION: Slight worsening of left hemisphere vasogenic edema with new rightward midline shift of 4 mm. Electronically  Signed   By: Ulyses Jarred M.D.   On: 08/23/2020 00:14   MR Brain W Wo Contrast  Result Date: 08/18/2020 CLINICAL DATA:  Treated lung cancer metastatic to the brain. Surveillance. EXAM: MRI HEAD WITHOUT AND WITH CONTRAST TECHNIQUE: Multiplanar, multiecho pulse sequences of the brain and surrounding structures were obtained without and with intravenous contrast. CONTRAST:  56mL MULTIHANCE GADOBENATE DIMEGLUMINE 529 MG/ML IV SOLN COMPARISON:  07/05/2020 FINDINGS: BRAIN New Lesions: New areas of subarachnoid enhancement seen preferentially along the left cerebral convexity the level of the temporal, posterior frontal, and parietal lobes. New small nodular enhancing focus at the tip of the left cerebellar tonsil which is likely leptomeningeal. There is also a canalicular and cisternal mass at the left CP angle cistern and IAC (12 x 6 mm). Larger lesions: Treated enhancing lesion located in the inferior and lateral left frontal parietal convexity has increased from 20 mm to now 28 mm, 12:90 . Stable or Smaller lesions: Twenty-two mm enhancing lesion located in the left parietal lobe and seen on 12:11. 1 cm lesion along the cortex of the posterior left frontal lobe on 12:124. Other Brain findings: Vasogenic edema is confluent in the posterior left cerebrum with local mass effect, non progressed. No acute infarct, hemorrhage, hydrocephalus, or collection. Chronic blood products associated with the parenchymal lesions. Vascular: Preserved flow voids and vascular enhancements Skull and upper cervical spine: Normal marrow signal Sinuses/Orbits: Negative These results will be called to the ordering clinician or representative by the Radiologist Assistant, and communication documented in the PACS or [employer]. IMPRESSION: 1. 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 2. Three treated parenchymal brain metastases with  continued extensive vasogenic edema. The largest and most inferior has enlarged by 8 mm. The other 2 lesions are essentially stable. Electronically Signed   By: [name] Fantasia M.D.   On: 08/18/2020 14:00     ASSESSMENT:  1. 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 -Maintenance pembrolizumab started on 09/06/2019. -MRI brain from 02/03/2020 showed 4 mm left parietal nodular enhancement. Differential includes metastatic versus subacute infarction. -CT CAP on 02/03/2020 showed new nodularity in the bilateral upper lungs measuring 6 mm. Ill-defined soft tissue in the medial right supraclavicular region and right paratracheal region stable versus mildly improved. Stable mild axillary lymphadenopathy, right greater than left. No suspicious findings in the abdomen or pelvis. -CT chest with contrast on 03/28/2020 showed subpleural groundglass opacity in the left lower lobe consistent with atelectasis/infection. Bilateral micronodularity, mostly in the upper part of the lungs progressive from 02/03/2020. -CT AP on 05/29/2020 did not show any evidence of metastatic disease in the abdomen or pelvis. -CT chest on 06/12/2020 shows near complete resolution of previously seen diffuse micronodularity.  Asymmetric right thoracic inlet and right paratracheal soft tissue stable.  2. Left basilic vein thrombus: -Ultrasound on 05/03/2019 showed nonocclusive thrombus around the PICC line in the left basilic vein considered superficial thrombus with no evidence of DVT. -Because of her metastatic cancer, she was told to continue Eliquis.  3. Brain metastasis: -MRI of the brain on 04/26/2020 showed left parietal lesion measuring 2.0 x 1.6 cm. Left frontal enhancing lesion measures 1.2 x 0.9 cm. 1.7 x 1 point centimeters enhancing dorsal left frontal/superior temporal lesion present. -SRS to the brain lesions  completed on 05/24/2020. 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  3 treated parenchymal brain metastasis with continued extensive vasogenic edema.  Largest and most inferior has enlarged by 8 mm.  Other 2 lesions are stable.   PLAN:  1. Metastatic squamous cell carcinoma of the lung: -Based on the findings of the brain MRI from 08/18/2020, she was recommended whole brain radiation therapy. -Dr. Mickeal Skinner has suggested bevacizumab concurrent with radiation. -We discussed side effects of bevacizumab in detail. -I would not start Keytruda at this time as she is on dexamethasone taper.  We will likely add Keytruda at next cycle.  2. Peripheral neuropathy: -Continue gabapentin 300 mg twice daily.  3. Left basilic vein thrombus: -Continue Eliquis with no bleeding issues.  4. Thyroid abnormality: -Latest TSH is 9.35.  We'll closely monitor.  5. Brain metastasis: -She is currently on dexamethasone 4 mg daily. -She will start whole brain radiation next week.   Orders placed this encounter:  No orders of the defined types were placed in this encounter.    Derek Jack, MD [place] [phone]   I, Milinda Antis, am acting as a scribe for Dr. Sanda Linger.  I, Derek Jack MD, have reviewed the above documentation for accuracy and completeness, and I agree with the above.

## 2020-09-06 NOTE — Patient Instructions (Signed)
[place] at [facility] Discharge Instructions  You were seen today by Dr. Delton Coombes. He went over your recent results. You received your first treatment of bevacizumab today. Dr. Delton Coombes will see you back in 2 weeks for labs and follow up.   Thank you for choosing [place] at [facility] to provide your oncology and hematology care.  To afford each patient quality time with our provider, please arrive at least 15 minutes before your scheduled appointment time.   If you have a lab appointment with the [facility] please come in thru the Main Entrance and check in at the main information desk  You need to re-schedule your appointment should you arrive 10 or more minutes late.  We strive to give you quality time with our providers, and arriving late affects you and other patients whose appointments are after yours.  Also, if you no show three or more times for appointments you may be dismissed from the clinic at the providers discretion.     Again, thank you for choosing [facility].  Our hope is that these requests will decrease the amount of time that you wait before being seen by our physicians.       _____________________________________________________________  Should you have questions after your visit to [facility], please contact our office at [phone] between the hours of 8:00 a.m. and 4:30 p.m.  Voicemails left after 4:00 p.m. will not be returned until the following business day.  For prescription refill requests, have your pharmacy contact our office and allow 72 hours.    Cancer Center Support Programs:   > Cancer Support Group  2nd Tuesday of the month 1pm-2pm, Journey Room

## 2020-09-06 NOTE — Progress Notes (Signed)
Tara Aguilar presents today for D1C1 Avastin. Patient has been assessed by Dr. Delton Coombes who has approved proceeding with treatment today as planned. Labs and UA are still pending but Dr. Delton Coombes states it is ok to proceed. Vitals, including SBP 94 and HR 104 also noted. Proceeding with Avastin today per MD orders.  BP recheck 128/86 and HR 98 before releasing Avastin. MD aware.   Labs and UA resulted and within parameters.  Infusion tolerated without incident or complaint. VSS upon completion of treatment. IV flushed and removed per protocol, see MAR and IV flowsheet for details. Noted for positive blood return by 2 RNs prior to, during, and post infusion. Discharged in satisfactory condition with follow up instructions.

## 2020-09-06 NOTE — Patient Instructions (Signed)
[place] at [facility] Discharge Instructions  Labs drawn peripherally today   Thank you for choosing [place] at [facility] to provide your oncology and hematology care.  To afford each patient quality time with our provider, please arrive at least 15 minutes before your scheduled appointment time.   If you have a lab appointment with the [facility] please come in thru the Main Entrance and check in at the main information desk.  You need to re-schedule your appointment should you arrive 10 or more minutes late.  We strive to give you quality time with our providers, and arriving late affects you and other patients whose appointments are after yours.  Also, if you no show three or more times for appointments you may be dismissed from the clinic at the providers discretion.     Again, thank you for choosing [facility].  Our hope is that these requests will decrease the amount of time that you wait before being seen by our physicians.       _____________________________________________________________  Should you have questions after your visit to [facility], please contact our office at [phone] and follow the prompts.  Our office hours are 8:00 a.m. and 4:30 p.m. Monday - Friday.  Please note that voicemails left after 4:00 p.m. may not be returned until the following business day.  We are closed weekends and major holidays.  You do have access to a nurse 24-7, just call the main number to the clinic [phone] and do not press any options, hold on the line and a nurse will answer the phone.    For prescription refill requests, have your pharmacy contact our office and allow 72 hours.    Due to Covid, you will need to wear a mask upon entering the hospital. If you do not have a mask, a mask will be given to you at the Main Entrance upon arrival. For doctor visits, patients may have 1 support person age 66 or  older with them. For treatment visits, patients can not have anyone with them due to social distancing guidelines and our immunocompromised population.

## 2020-09-06 NOTE — Progress Notes (Signed)
No Keytruda today, may give in 2 weeks. Patient on dexamethasone 4 mg daily per MD.  Infuse bavacizumab today over 90 minutes per MD  T.O. Dr Rhys Martini, PharmD

## 2020-09-06 NOTE — Patient Instructions (Signed)
[facility] Discharge Instructions for Patients Receiving Chemotherapy   Beginning January 23rd 2017 lab work for the [facility]: [place] Tara Aguilar will be done in the  Main lab at [facility] on 1st floor. If you have a lab appointment with the [facility] please come in thru the  Main Entrance and check in at the main information desk   Today you received the following chemotherapy agents Avastin  To help prevent nausea and vomiting after your treatment, we encourage you to take your nausea medication   If you develop nausea and vomiting, or diarrhea that is not controlled by your medication, call the clinic.  The clinic phone number is [phone]. Office hours are Monday-Friday 8:30am-5:00pm.  BELOW ARE SYMPTOMS THAT SHOULD BE REPORTED IMMEDIATELY:  *FEVER GREATER THAN 101.0 F  *CHILLS WITH OR WITHOUT FEVER  NAUSEA AND VOMITING THAT IS NOT CONTROLLED WITH YOUR NAUSEA MEDICATION  *UNUSUAL SHORTNESS OF BREATH  *UNUSUAL BRUISING OR BLEEDING  TENDERNESS IN MOUTH AND THROAT WITH OR WITHOUT PRESENCE OF ULCERS  *URINARY PROBLEMS  *BOWEL PROBLEMS  UNUSUAL RASH Items with * indicate a potential emergency and should be followed up as soon as possible. If you have an emergency after office hours please contact your primary care physician or go to the nearest emergency department.  Please call the clinic during office hours if you have any questions or concerns.   You may also contact the Patient Navigator at [phone] should you have any questions or need assistance in obtaining follow up care.      Resources For Cancer Patients and their Caregivers ? American Cancer Society: Can assist with transportation, wigs, general needs, runs Look Good Feel Better.        951-477-9324 ? Cancer Care: Provides financial assistance, online support groups, medication/co-pay assistance.  1-800-813-HOPE 782-861-4687) ? [place] Assists [place] cancer  patients and their families through emotional , educational and financial support.  425 583 2442 ? [place] DSS Where to apply for food stamps, Medicaid and utility assistance. 234-749-2939 ? RCATS: Transportation to medical appointments. 224-250-9187 ? Social Security Administration: May apply for disability if have a Stage IV cancer. [phone] ? [employer], Disability and Transit Services: Assists with nutrition, care and transit needs. 9845070524

## 2020-09-11 DIAGNOSIS — Z510 Encounter for antineoplastic radiation therapy: Secondary | ICD-10-CM | POA: Diagnosis not present

## 2020-09-12 ENCOUNTER — Other Ambulatory Visit: Payer: Self-pay

## 2020-09-12 ENCOUNTER — Ambulatory Visit
Admission: RE | Admit: 2020-09-12 | Discharge: 2020-09-12 | Disposition: A | Discharge status: Home / Self Care | Payer: BC Managed Care – PPO | Source: Ambulatory Visit | Attending: Radiation Oncology | Admitting: Radiation Oncology

## 2020-09-12 DIAGNOSIS — Z510 Encounter for antineoplastic radiation therapy: Secondary | ICD-10-CM | POA: Diagnosis not present

## 2020-09-13 ENCOUNTER — Ambulatory Visit
Admission: RE | Admit: 2020-09-13 | Discharge: 2020-09-13 | Disposition: A | Discharge status: Home / Self Care | Payer: BC Managed Care – PPO | Source: Ambulatory Visit | Attending: Radiation Oncology | Admitting: Radiation Oncology

## 2020-09-13 ENCOUNTER — Other Ambulatory Visit: Payer: Self-pay

## 2020-09-13 DIAGNOSIS — Z510 Encounter for antineoplastic radiation therapy: Secondary | ICD-10-CM | POA: Diagnosis not present

## 2020-09-14 ENCOUNTER — Ambulatory Visit
Admission: RE | Admit: 2020-09-14 | Discharge: 2020-09-14 | Disposition: A | Discharge status: Home / Self Care | Payer: BC Managed Care – PPO | Source: Ambulatory Visit | Attending: Radiation Oncology | Admitting: Radiation Oncology

## 2020-09-14 DIAGNOSIS — Z510 Encounter for antineoplastic radiation therapy: Secondary | ICD-10-CM | POA: Diagnosis not present

## 2020-09-16 ENCOUNTER — Ambulatory Visit
Admission: RE | Admit: 2020-09-16 | Discharge: 2020-09-16 | Disposition: A | Discharge status: Home / Self Care | Payer: BC Managed Care – PPO | Source: Ambulatory Visit | Attending: Radiation Oncology | Admitting: Radiation Oncology

## 2020-09-16 ENCOUNTER — Other Ambulatory Visit: Payer: Self-pay

## 2020-09-16 DIAGNOSIS — Z510 Encounter for antineoplastic radiation therapy: Secondary | ICD-10-CM | POA: Diagnosis not present

## 2020-09-17 ENCOUNTER — Ambulatory Visit
Admission: RE | Admit: 2020-09-17 | Discharge: 2020-09-17 | Disposition: A | Discharge status: Home / Self Care | Payer: BC Managed Care – PPO | Source: Ambulatory Visit | Attending: Radiation Oncology | Admitting: Radiation Oncology

## 2020-09-17 DIAGNOSIS — Z510 Encounter for antineoplastic radiation therapy: Secondary | ICD-10-CM | POA: Diagnosis not present

## 2020-09-18 ENCOUNTER — Ambulatory Visit
Admission: RE | Admit: 2020-09-18 | Discharge: 2020-09-18 | Disposition: A | Discharge status: Home / Self Care | Payer: BC Managed Care – PPO | Source: Ambulatory Visit | Attending: Radiation Oncology | Admitting: Radiation Oncology

## 2020-09-18 DIAGNOSIS — Z510 Encounter for antineoplastic radiation therapy: Secondary | ICD-10-CM | POA: Diagnosis not present

## 2020-09-19 ENCOUNTER — Inpatient Hospital Stay (HOSPITAL_COMMUNITY): Payer: BC Managed Care – PPO

## 2020-09-19 ENCOUNTER — Ambulatory Visit
Admission: RE | Admit: 2020-09-19 | Discharge: 2020-09-19 | Disposition: A | Discharge status: Home / Self Care | Payer: BC Managed Care – PPO | Source: Ambulatory Visit | Attending: Radiation Oncology | Admitting: Radiation Oncology

## 2020-09-19 ENCOUNTER — Inpatient Hospital Stay (HOSPITAL_COMMUNITY): Payer: BC Managed Care – PPO | Admitting: Hematology

## 2020-09-19 ENCOUNTER — Other Ambulatory Visit: Payer: Self-pay

## 2020-09-19 ENCOUNTER — Other Ambulatory Visit (HOSPITAL_COMMUNITY): Payer: BC Managed Care – PPO

## 2020-09-19 VITALS — BP 135/86 | HR 89 | Temp 97.0°F | Resp 20 | Wt 295.6 lb

## 2020-09-19 VITALS — BP 127/71 | HR 95 | Temp 98.2°F | Resp 20

## 2020-09-19 DIAGNOSIS — Z5112 Encounter for antineoplastic immunotherapy: Secondary | ICD-10-CM | POA: Diagnosis not present

## 2020-09-19 DIAGNOSIS — C3490 Malignant neoplasm of unspecified part of unspecified bronchus or lung: Secondary | ICD-10-CM | POA: Diagnosis not present

## 2020-09-19 DIAGNOSIS — Z510 Encounter for antineoplastic radiation therapy: Secondary | ICD-10-CM | POA: Diagnosis not present

## 2020-09-19 DIAGNOSIS — C7931 Secondary malignant neoplasm of brain: Secondary | ICD-10-CM

## 2020-09-19 LAB — COMPREHENSIVE METABOLIC PANEL
ALT: 178 U/L — ABNORMAL HIGH (ref 0–44)
AST: 47 U/L — ABNORMAL HIGH (ref 15–41)
Albumin: 4 g/dL (ref 3.5–5.0)
Alkaline Phosphatase: 94 U/L (ref 38–126)
Anion gap: 11 (ref 5–15)
BUN: 15 mg/dL (ref 6–20)
CO2: 30 mmol/L (ref 22–32)
Calcium: 9.3 mg/dL (ref 8.9–10.3)
Chloride: 98 mmol/L (ref 98–111)
Creatinine, Ser: 0.8 mg/dL (ref 0.44–1.00)
GFR, Estimated: >60 mL/min (ref >60)
Glucose, Bld: 134 mg/dL — ABNORMAL HIGH (ref 70–99)
Potassium: 4.4 mmol/L (ref 3.5–5.1)
Sodium: 139 mmol/L (ref 135–145)
Total Bilirubin: 0.5 mg/dL (ref 0.3–1.2)
Total Protein: 7 g/dL (ref 6.5–8.1)

## 2020-09-19 LAB — CBC WITH DIFFERENTIAL/PLATELET
Abs Immature Granulocytes: 0.14 10*3/uL — ABNORMAL HIGH (ref 0.00–0.07)
Basophils Absolute: 0 10*3/uL (ref 0.0–0.1)
Basophils Relative: 1 %
Eosinophils Absolute: 0.1 10*3/uL (ref 0.0–0.5)
Eosinophils Relative: 1 %
HCT: 51.8 % — ABNORMAL HIGH (ref 36.0–46.0)
Hemoglobin: 16.7 g/dL — ABNORMAL HIGH (ref 12.0–15.0)
Immature Granulocytes: 2 %
Lymphocytes Relative: 12 %
Lymphs Abs: 0.9 10*3/uL (ref 0.7–4.0)
MCH: 32.9 pg (ref 26.0–34.0)
MCHC: 32.2 g/dL (ref 30.0–36.0)
MCV: 102 fL — ABNORMAL HIGH (ref 80.0–100.0)
Monocytes Absolute: 0.4 10*3/uL (ref 0.1–1.0)
Monocytes Relative: 6 %
Neutro Abs: 5.4 10*3/uL (ref 1.7–7.7)
Neutrophils Relative %: 78 %
Platelets: 205 10*3/uL (ref 150–400)
RBC: 5.08 MIL/uL (ref 3.87–5.11)
RDW: 13.3 % (ref 11.5–15.5)
WBC: 6.9 10*3/uL (ref 4.0–10.5)
nRBC: 0 % (ref 0.0–0.2)

## 2020-09-19 LAB — TSH: TSH: 6.943 u[IU]/mL — ABNORMAL HIGH (ref 0.350–4.500)

## 2020-09-19 MED ORDER — SODIUM CHLORIDE 0.9 % IV SOLN
10.0000 mg/kg | Freq: Once | INTRAVENOUS | Status: AC
  Administered 2020-09-19: 1300 mg via INTRAVENOUS
  Filled 2020-09-19: qty 48

## 2020-09-19 MED ORDER — SODIUM CHLORIDE 0.9 % IV SOLN: Freq: Once | INTRAVENOUS | Status: AC

## 2020-09-19 MED ORDER — SODIUM CHLORIDE 0.9 % IV SOLN
200.0000 mg | Freq: Once | INTRAVENOUS | Status: AC
  Administered 2020-09-19: 200 mg via INTRAVENOUS
  Filled 2020-09-19: qty 8

## 2020-09-19 NOTE — Progress Notes (Signed)
1045 Labs reviewed with and pt seen by Dr. Delton Coombes and pt approved for Avastin and Keytruda infusions today per MD           Kathalene Frames tolerated Avastin and Keytruda infusions well without complaints or incident. Peripheral IV site checked by 2 RN's with positive blood return noted prior to and after each infusion. VSS upon discharge. Pt discharged via wheelchair in satisfactory condition

## 2020-09-19 NOTE — Patient Instructions (Signed)
[facility] Discharge Instructions for Patients Receiving Chemotherapy   Beginning January 23rd 2017 lab work for the [facility] will be done in the  Main lab at [facility] on 1st floor. If you have a lab appointment with the [facility] please come in thru the  Main Entrance and check in at the main information desk   Today you received the following chemotherapy agents Keytruda and Avastin. Follow-up as scheduled  To help prevent nausea and vomiting after your treatment, we encourage you to take your nausea medication   If you develop nausea and vomiting, or diarrhea that is not controlled by your medication, call the clinic.  The clinic phone number is [phone]. Office hours are Monday-Friday 8:30am-5:00pm.  BELOW ARE SYMPTOMS THAT SHOULD BE REPORTED IMMEDIATELY:  *FEVER GREATER THAN 101.0 F  *CHILLS WITH OR WITHOUT FEVER  NAUSEA AND VOMITING THAT IS NOT CONTROLLED WITH YOUR NAUSEA MEDICATION  *UNUSUAL SHORTNESS OF BREATH  *UNUSUAL BRUISING OR BLEEDING  TENDERNESS IN MOUTH AND THROAT WITH OR WITHOUT PRESENCE OF ULCERS  *URINARY PROBLEMS  *BOWEL PROBLEMS  UNUSUAL RASH Items with * indicate a potential emergency and should be followed up as soon as possible. If you have an emergency after office hours please contact your primary care physician or go to the nearest emergency department.  Please call the clinic during office hours if you have any questions or concerns.   You may also contact the Patient Navigator at [phone] should you have any questions or need assistance in obtaining follow up care.      Resources For Cancer Patients and their Caregivers ? American Cancer Society: Can assist with transportation, wigs, general needs, runs Look Good Feel Better.        419-639-5414 ? Cancer Care: Provides financial assistance, online support groups, medication/co-pay assistance.  1-800-813-HOPE 629 537 5098) ? [place] Assists [place] cancer patients and their families through emotional , educational and financial support.  236-745-1675 ? [place] DSS Where to apply for food stamps, Medicaid and utility assistance. 320 568 8724 ? RCATS: Transportation to medical appointments. 413-552-3213 ? Social Security Administration: May apply for disability if have a Stage IV cancer. 215-363-0344 [phone] ? [employer], Disability and Transit Services: Assists with nutrition, care and transit needs. [phone]

## 2020-09-19 NOTE — Progress Notes (Signed)
Per Dr Delton Coombes:  Give bevacizumab today then will go to q 3 week interval with Keytruda.  T.O. Dr Rhys Martini, PharmD

## 2020-09-19 NOTE — Progress Notes (Signed)
Patient was assessed by Dr. Delton Coombes and labs have been reviewed.  She is currently on dexamethasone 4 mg daily, Dr. Delton Coombes okay with adding Keytruda to her treatment today. Her AST 47 ALT 178, patient is okay to proceed with treatment today. Primary RN and pharmacy aware.

## 2020-09-19 NOTE — Progress Notes (Addendum)
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  PRIOR THERAPY: Carboplatin, paclitaxel and Keytruda x 7 cycles from 04/08/2019 to 08/16/2019; Keytruda x 18 cycles to 04/11/2020  NGS Results: Not done  CURRENT THERAPY: Keytruda and Avastin every 3 weeks  BRIEF ONCOLOGIC HISTORY:  Oncology History  Squamous cell lung cancer, unspecified laterality ([place])  04/05/2019 Initial Diagnosis   Squamous cell lung cancer, unspecified laterality ([place])   04/08/2019 -  Chemotherapy   The patient had palonosetron (ALOXI) injection 0.25 mg, 0.25 mg, Intravenous,  Once, 7 of 7 cycles Administration: 0.25 mg (04/08/2019), 0.25 mg (05/02/2019), 0.25 mg (05/23/2019), 0.25 mg (06/13/2019), 0.25 mg (07/05/2019), 0.25 mg (07/26/2019), 0.25 mg (08/16/2019) pegfilgrastim (NEULASTA) injection 6 mg, 6 mg, Subcutaneous, Once, 2 of 2 cycles Administration: 6 mg (04/11/2019), 6 mg (05/04/2019) pegfilgrastim (NEULASTA ONPRO KIT) injection 6 mg, 6 mg, Subcutaneous, Once, 3 of 3 cycles pegfilgrastim-cbqv (UDENYCA) injection 6 mg, 6 mg, Subcutaneous, Once, 5 of 5 cycles Administration: 6 mg (05/25/2019), 6 mg (06/15/2019), 6 mg (07/07/2019), 6 mg (07/28/2019), 6 mg (08/18/2019) CARBOplatin (PARAPLATIN) 900 mg in sodium chloride 0.9 % 500 mL chemo infusion, 900 mg (100 % of original dose 900 mg), Intravenous,  Once, 7 of 7 cycles Dose modification:   (original dose 900 mg, Cycle 1),   (original dose 900 mg, Cycle 2),   (original dose 900 mg, Cycle 3),   (original dose 900 mg, Cycle 4) Administration: 900 mg (04/08/2019), 900 mg (05/02/2019), 900 mg (05/23/2019), 900 mg (06/13/2019), 900 mg (07/05/2019), 900 mg (07/26/2019), 900 mg (08/16/2019) PACLitaxel (TAXOL) 492 mg in sodium chloride 0.9 % 500 mL chemo infusion (>  35m/m2), 200 mg/m2 = 492 mg, Intravenous,  Once, 7 of 7 cycles Dose modification: 175 mg/m2 (original dose 200 mg/m2, Cycle 6, Reason: Other (see comments), Comment: neuropathy) Administration: 492 mg (04/08/2019), 492 mg (05/02/2019), 492 mg (05/23/2019), 492 mg (06/13/2019), 492 mg (07/05/2019), 432 mg (07/26/2019), 432 mg (08/16/2019) pembrolizumab (KEYTRUDA) 200 mg in sodium chloride 0.9 % 50 mL chemo infusion, 200 mg, Intravenous, Once, 18 of 21 cycles Administration: 200 mg (04/08/2019), 200 mg (07/05/2019), 200 mg (05/02/2019), 200 mg (05/23/2019), 200 mg (06/13/2019), 200 mg (07/26/2019), 200 mg (08/16/2019), 200 mg (09/06/2019), 200 mg (09/27/2019), 200 mg (10/19/2019), 200 mg (11/09/2019), 200 mg (11/30/2019), 200 mg (12/21/2019), 200 mg (01/12/2020), 200 mg (02/07/2020), 200 mg (02/28/2020), 200 mg (03/21/2020), 200 mg (04/11/2020) fosaprepitant (EMEND) 150 mg, dexamethasone (DECADRON) 12 mg in sodium chloride 0.9 % 145 mL IVPB, , Intravenous,  Once, 7 of 7 cycles Administration:  (04/08/2019),  (05/02/2019),  (05/23/2019),  (06/13/2019),  (07/05/2019),  (07/26/2019),  (08/16/2019)  for chemotherapy treatment.    09/06/2020 -  Chemotherapy   The patient had bevacizumab-bvzr (ZIRABEV) 1,300 mg in sodium chloride 0.9 % 100 mL chemo infusion, 10 mg/kg = 1,300 mg, Intravenous,  Once, 1 of 6 cycles Administration: 1,300 mg (09/06/2020)  for chemotherapy treatment.    [place]  07/04/2020 Initial Diagnosis   [place]   09/06/2020 -  Chemotherapy   The patient had bevacizumab-bvzr (ZIRABEV) 1,300 mg in sodium chloride 0.9 % 100 mL chemo infusion, 10 mg/kg = 1,300 mg, Intravenous,  Once, 1 of 6 cycles Administration: 1,300 mg (09/06/2020)  for chemotherapy treatment.  CANCER STAGING: Cancer Staging No matching staging information was found for the patient.  INTERVAL HISTORY:  Tara Aguilar, a 39 y.o. female, returns for routine follow-up and consideration for next cycle of  chemotherapy. Tara Aguilar was last seen on 09/06/2020.  Due for cycle #19 of Keytruda and bevacizumab today.   Overall, she tells me she has been feeling okay. She started her radiation treatment on 11/10 and has 4 more sessions left (12/1). She tolerated the previous chemo well and denies nosebleeds, hematochezia or hematuria. She still takes Decadron 4 mg daily. She spends most of the day in her recliner due to her fatigue. She is taking magnesium citrate for her constipation; she does not take a stool softener since she has never had an issue with diarrhea or constipation before. She reports having a dull headache for the past couple days, but denies blurry or double vision. She denies extremity weakness. Her appetite is good.  She has not set up an appointment with Dr. Mickeal Skinner yet.  Overall, she feels ready for next cycle of chemo today.    REVIEW OF SYSTEMS:  Review of Systems  Constitutional: Positive for fatigue (25%). Negative for appetite change.  Eyes: Negative for eye problems.  Gastrointestinal: Positive for constipation (on magnesium citrate). Negative for diarrhea.  Neurological: Positive for dizziness and headaches. Negative for extremity weakness.  Psychiatric/Behavioral: The patient is nervous/anxious.   All other systems reviewed and are negative.   PAST MEDICAL/SURGICAL HISTORY:  Past Medical History:  Diagnosis Date  . Cancer ([place])   . Depression   . Glucose intolerance 03/11/2019  . Obesity, Class III, BMI 40-49.9 (morbid obesity) ([place]) 03/11/2019   Past Surgical History:  Procedure Laterality Date  . CESAREAN SECTION    . IRRIGATION AND DEBRIDEMENT ABSCESS  03/15/2019   RETROPHARYNGEAL   . TONSILLECTOMY AND ADENOIDECTOMY N/A 03/15/2019   Procedure: IRRIGATION AND DEBRIDEMENT OF RETROPHARYNGEAL ABSCESS;  Surgeon: Leta Baptist, MD;  Location: [facility] OR;  Service: ENT;  Laterality: N/A;    SOCIAL HISTORY:  Social History   Socioeconomic History  . Marital status: Widowed     Spouse name: Not on file  . Number of children: 1  . Years of education: Not on file  . Highest education level: Not on file  Occupational History  . Not on file  Tobacco Use  . Smoking status: Current Some Day Smoker    Packs/day: 1.00    Years: 25.00    Pack years: 25.00    Types: Cigarettes  . Smokeless tobacco: Never Used  Vaping Use  . Vaping Use: Never used  Substance and Sexual Activity  . Alcohol use: Not Currently  . Drug use: Not Currently  . Sexual activity: Not on file  Other Topics Concern  . Not on file  Social History Narrative   April 02, 2019   Husband passed away four months ago from Cancer of the [place].   Pt. Has 73 yo son at home.   Social Determinants of Health   Financial Resource Strain: Low Risk   . Difficulty of Paying Living Expenses: Not hard at all  Food Insecurity: No Food Insecurity  . Worried About Charity fundraiser in the Last Year: Never true  . Ran Out of Food in the Last Year: Never true  Transportation Needs: No Transportation Needs  . Lack of Transportation (Medical): No  . Lack of Transportation (Non-Medical): No  Physical Activity: Inactive  . Days of Exercise per Week: 0 days  .  Minutes of Exercise per Session: 0 min  Stress: No Stress Concern Present  . Feeling of Stress : Only a little  Social Connections: Socially Isolated  . Frequency of Communication with Friends and Family: More than three times a week  . Frequency of Social Gatherings with Friends and Family: Three times a week  . Attends Religious Services: Never  . Active Member of Clubs or Organizations: No  . Attends Archivist Meetings: Never  . Marital Status: Widowed  Intimate Partner Violence: Not At Risk  . Fear of Current or Ex-Partner: No  . Emotionally Abused: No  . Physically Abused: No  . Sexually Abused: No    FAMILY HISTORY:  Family History  Problem Relation Age of Onset  . Lung cancer Mother   . Hypertension Mother   . Leukemia Father    . Myelodysplastic syndrome Father     CURRENT MEDICATIONS:  Current Outpatient Medications  Medication Sig Dispense Refill  . albuterol (PROVENTIL) (2.5 MG/3ML) 0.083% nebulizer solution Take 3 mLs (2.5 mg total) by nebulization every 2 (two) hours as needed for wheezing. 75 mL 12  . albuterol (VENTOLIN HFA) 108 (90 Base) MCG/ACT inhaler Inhale 2 puffs into the lungs every 6 (six) hours as needed for wheezing or shortness of breath. 18 g 3  . blood glucose meter kit and supplies KIT Dispense based on patient and insurance preference. Use up to four times daily as directed. (FOR ICD-9 250.00, 250.01). 1 each 0  . dexamethasone (DECADRON) 1 MG tablet Take by mouth.    . dexamethasone (DECADRON) 4 MG tablet Take 1 tablet (4 mg total) by mouth daily.    [name] gabapentin (NEURONTIN) 300 MG capsule Take 300 mg by mouth 2 (two) times daily.     . insulin aspart (NOVOLOG) 100 UNIT/ML FlexPen Inject 10 Units into the skin 3 (three) times daily with meals. 15 mL 0  . insulin glargine (LANTUS) 100 UNIT/ML Solostar Pen Inject 50 Units into the skin daily. 15 mL 0  . levETIRAcetam (KEPPRA) 1000 MG tablet Take 1 tablet (1,000 mg total) by mouth 2 (two) times daily. 60 tablet 3   No current facility-administered medications for this visit.    ALLERGIES:  No Known Allergies  PHYSICAL EXAM:  Performance status (ECOG): 2 - Symptomatic, <50% confined to bed  Vitals:   09/19/20 0927  BP: 135/86  Pulse: 89  Resp: 20  Temp: (!) 97 F (36.1 C)  SpO2: 95%   Wt Readings from Last 3 Encounters:  09/19/20 295 lb 9.6 oz (134.1 kg)  09/06/20 (!) 303 lb (137.4 kg)  09/05/20 288 lb (130.6 kg)   Physical Exam Vitals reviewed.  Constitutional:      Appearance: Normal appearance. She is obese.  Cardiovascular:     Rate and Rhythm: Normal rate and regular rhythm.     Pulses: Normal pulses.     Heart sounds: Normal heart sounds.  Pulmonary:     Effort: Pulmonary effort is normal.     Breath sounds: Normal  breath sounds.  Neurological:     General: No focal deficit present.     Mental Status: She is alert and oriented to person, place, and time.  Psychiatric:        Mood and Affect: Mood normal.        Behavior: Behavior normal.     LABORATORY DATA:  I have reviewed the labs as listed.  CBC Latest Ref Rng & Units 09/19/2020 09/06/2020 08/22/2020  WBC  4.0 - 10.5 K/uL 6.9 11.2(H) 9.6  Hemoglobin 12.0 - 15.0 g/dL 16.7(H) 15.1(H) 16.0(H)  Hematocrit 36 - 46 % 51.8(H) 47.6(H) 47.8(H)  Platelets 150 - 400 K/uL 205 178 256   CMP Latest Ref Rng & Units 09/19/2020 09/06/2020 08/22/2020  Glucose 70 - 99 mg/dL 134(H) 152(H) 223(H)  BUN 6 - 20 mg/dL 15 26(H) 12  Creatinine 0.44 - 1.00 mg/dL 0.80 0.83 0.67  Sodium 135 - 145 mmol/L 139 139 137  Potassium 3.5 - 5.1 mmol/L 4.4 3.9 4.0  Chloride 98 - 111 mmol/L 98 101 100  CO2 22 - 32 mmol/L _0 Calcium 8.9 - 10.3 mg/dL 9.3 9.2 9.6  Total Protein 6.5 - 8.1 g/dL 7.0 6.7 7.4  Total Bilirubin 0.3 - 1.2 mg/dL 0.5 0.6 0.5  Alkaline Phos 38 - 126 U/L 94 78 84  AST 15 - 41 U/L 47(H) 42(H) 27  ALT 0 - 44 U/L 178(H) 103(H) 61(H)    DIAGNOSTIC IMAGING:  I have independently reviewed the scans and discussed with the patient. CT Head Wo Contrast  Result Date: 08/23/2020 CLINICAL DATA:  Intracranial metastatic disease with right-sided tremors EXAM: CT HEAD WITHOUT CONTRAST TECHNIQUE: Contiguous axial images were obtained from the base of the skull through the vertex without intravenous contrast. COMPARISON:  None. FINDINGS: Brain: Persistent large amount of vasogenic edema within the left hemisphere, slightly worsened compared to MRI of 08/18/2020. There is rightward midline shift measuring 4 mm. No hydrocephalus. No acute hemorrhage. Vascular: No hyperdense vessel or unexpected calcification. Skull: Normal. Negative for fracture or focal lesion. Sinuses/Orbits: No acute finding. Other: None. IMPRESSION: Slight worsening of left hemisphere vasogenic  edema with new rightward midline shift of 4 mm. Electronically Signed   By: Ulyses Jarred M.D.   On: 08/23/2020 00:14     ASSESSMENT:  1. 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 -Maintenance pembrolizumab started on 09/06/2019. -MRI brain from 02/03/2020 showed 4 mm left parietal nodular enhancement. Differential includes metastatic versus subacute infarction. -CT CAP on 02/03/2020 showed new nodularity in the bilateral upper lungs measuring 6 mm. Ill-defined soft tissue in the medial right supraclavicular region and right paratracheal region stable versus mildly improved. Stable mild axillary lymphadenopathy, right greater than left. No suspicious findings in the abdomen or pelvis. -CT chest with contrast on 03/28/2020 showed subpleural groundglass opacity in the left lower lobe consistent with atelectasis/infection. Bilateral micronodularity, mostly in the upper part of the lungs progressive from 02/03/2020. -CT AP on 05/29/2020 did not show any evidence of metastatic disease in the abdomen or pelvis. -CT chest on 06/12/2020 shows near complete resolution of previously seen diffuse micronodularity. Asymmetric right thoracic inlet and right paratracheal soft tissue stable.  2. Left basilic vein thrombus: -Ultrasound on 05/03/2019 showed nonocclusive thrombus around the PICC line in the left basilic vein considered superficial thrombus with no evidence of DVT. -Because of her metastatic cancer, she was told to continue Eliquis.  3. Brain metastasis: -MRI of the brain on 04/26/2020 showed left parietal lesion measuring 2.0 x 1.6 cm. Left frontal enhancing lesion measures 1.2 x 0.9 cm. 1.7 x 1 point centimeters enhancing dorsal left frontal/superior temporal lesion present. -SRS to the brain lesions completed on 05/24/2020. 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  3 treated parenchymal brain metastasis with continued extensive vasogenic edema.  Largest and  most inferior has enlarged by 8 mm.  Other 2 lesions are stable.   PLAN:  1. Metastatic squamous cell carcinoma of the lung: -She has tolerated first dose of Avastin without any problems.  No bleeding issues reported.  Blood pressure is within normal limits. -We have reviewed her labs.  UA shows no proteinuria. -She will proceed with next cycle of Avastin.  We will also add Keytruda.  RTC 3 weeks. -She will take Colace and MiraLAX for constipation.  2. Peripheral neuropathy: -Continue gabapentin 300 mg twice daily.  3. Left basilic vein thrombus: -Continue Eliquis with no bleeding issues.  4. Thyroid abnormality: -We will continue to monitor TSH closely.  5. Brain metastasis: -She is receiving whole brain radiation therapy.  Continue dexamethasone.  We will decrease dexamethasone to 2 mg in 2 weeks.   Orders placed this encounter:  No orders of the defined types were placed in this encounter.    Derek Jack, MD [place] [phone]   I, Milinda Antis, am acting as a scribe for Dr. Sanda Linger.  I, Derek Jack MD, have reviewed the above documentation for accuracy and completeness, and I agree with the above.

## 2020-09-19 NOTE — Patient Instructions (Addendum)
[place] at [facility] Discharge Instructions  You were seen today by Dr. Delton Coombes. He went over your recent results. You received your treatment today with Keytruda. Start taking 2 tablets of stool softener daily, along with Miralax as needed, for your constipation. Once your bowels become regular, stop taking Miralax and continue taking 1 tablet of stool softener. On December 8th, start taking dexamethasone 2 mg daily. Dr. Delton Coombes will see you back in 3 weeks for labs and follow up.   Thank you for choosing [place] at [facility] to provide your oncology and hematology care.  To afford each patient quality time with our provider, please arrive at least 15 minutes before your scheduled appointment time.   If you have a lab appointment with the [facility] please come in thru the Main Entrance and check in at the main information desk  You need to re-schedule your appointment should you arrive 10 or more minutes late.  We strive to give you quality time with our providers, and arriving late affects you and other patients whose appointments are after yours.  Also, if you no show three or more times for appointments you may be dismissed from the clinic at the providers discretion.     Again, thank you for choosing [facility].  Our hope is that these requests will decrease the amount of time that you wait before being seen by our physicians.       _____________________________________________________________  Should you have questions after your visit to [facility], please contact our office at [phone] between the hours of 8:00 a.m. and 4:30 p.m.  Voicemails left after 4:00 p.m. will not be returned until the following business day.  For prescription refill requests, have your pharmacy contact our office and allow 72 hours.    Cancer Center Support Programs:   > Cancer Support Group  2nd Tuesday of the month  1pm-2pm, Journey Room

## 2020-09-23 ENCOUNTER — Emergency Department (HOSPITAL_COMMUNITY): Payer: BC Managed Care – PPO

## 2020-09-23 ENCOUNTER — Observation Stay (HOSPITAL_COMMUNITY): Payer: BC Managed Care – PPO

## 2020-09-23 ENCOUNTER — Other Ambulatory Visit: Payer: Self-pay

## 2020-09-23 ENCOUNTER — Encounter (HOSPITAL_COMMUNITY): Payer: Self-pay

## 2020-09-23 ENCOUNTER — Inpatient Hospital Stay (HOSPITAL_COMMUNITY)
Admission: EM | Admit: 2020-09-23 | Discharge: 2020-09-26 | DRG: 682 | Disposition: A | Discharge status: Hospice | Payer: BC Managed Care – PPO | Attending: Internal Medicine | Admitting: Internal Medicine

## 2020-09-23 DIAGNOSIS — G9341 Metabolic encephalopathy: Secondary | ICD-10-CM | POA: Diagnosis present

## 2020-09-23 DIAGNOSIS — R160 Hepatomegaly, not elsewhere classified: Secondary | ICD-10-CM | POA: Diagnosis not present

## 2020-09-23 DIAGNOSIS — C3490 Malignant neoplasm of unspecified part of unspecified bronchus or lung: Secondary | ICD-10-CM | POA: Diagnosis present

## 2020-09-23 DIAGNOSIS — K5900 Constipation, unspecified: Secondary | ICD-10-CM | POA: Diagnosis present

## 2020-09-23 DIAGNOSIS — F32A Depression, unspecified: Secondary | ICD-10-CM | POA: Diagnosis present

## 2020-09-23 DIAGNOSIS — N179 Acute kidney failure, unspecified: Secondary | ICD-10-CM | POA: Diagnosis not present

## 2020-09-23 DIAGNOSIS — N3090 Cystitis, unspecified without hematuria: Secondary | ICD-10-CM | POA: Diagnosis present

## 2020-09-23 DIAGNOSIS — D8481 Immunodeficiency due to conditions classified elsewhere: Secondary | ICD-10-CM | POA: Diagnosis present

## 2020-09-23 DIAGNOSIS — E860 Dehydration: Secondary | ICD-10-CM | POA: Diagnosis present

## 2020-09-23 DIAGNOSIS — R7303 Prediabetes: Secondary | ICD-10-CM | POA: Diagnosis present

## 2020-09-23 DIAGNOSIS — G936 Cerebral edema: Secondary | ICD-10-CM | POA: Diagnosis present

## 2020-09-23 DIAGNOSIS — R6510 Systemic inflammatory response syndrome (SIRS) of non-infectious origin without acute organ dysfunction: Secondary | ICD-10-CM | POA: Diagnosis not present

## 2020-09-23 DIAGNOSIS — C771 Secondary and unspecified malignant neoplasm of intrathoracic lymph nodes: Secondary | ICD-10-CM | POA: Diagnosis present

## 2020-09-23 DIAGNOSIS — C7970 Secondary malignant neoplasm of unspecified adrenal gland: Secondary | ICD-10-CM | POA: Diagnosis present

## 2020-09-23 DIAGNOSIS — Z794 Long term (current) use of insulin: Secondary | ICD-10-CM

## 2020-09-23 DIAGNOSIS — R1084 Generalized abdominal pain: Secondary | ICD-10-CM

## 2020-09-23 DIAGNOSIS — E875 Hyperkalemia: Secondary | ICD-10-CM | POA: Diagnosis present

## 2020-09-23 DIAGNOSIS — Z79899 Other long term (current) drug therapy: Secondary | ICD-10-CM

## 2020-09-23 DIAGNOSIS — J9859 Other diseases of mediastinum, not elsewhere classified: Secondary | ICD-10-CM | POA: Diagnosis present

## 2020-09-23 DIAGNOSIS — Z66 Do not resuscitate: Secondary | ICD-10-CM | POA: Diagnosis not present

## 2020-09-23 DIAGNOSIS — Z20822 Contact with and (suspected) exposure to covid-19: Secondary | ICD-10-CM | POA: Diagnosis present

## 2020-09-23 DIAGNOSIS — Z515 Encounter for palliative care: Secondary | ICD-10-CM

## 2020-09-23 DIAGNOSIS — Z7952 Long term (current) use of systemic steroids: Secondary | ICD-10-CM

## 2020-09-23 DIAGNOSIS — Z806 Family history of leukemia: Secondary | ICD-10-CM

## 2020-09-23 DIAGNOSIS — C7931 Secondary malignant neoplasm of brain: Secondary | ICD-10-CM | POA: Diagnosis present

## 2020-09-23 DIAGNOSIS — Z8249 Family history of ischemic heart disease and other diseases of the circulatory system: Secondary | ICD-10-CM

## 2020-09-23 DIAGNOSIS — C7949 Secondary malignant neoplasm of other parts of nervous system: Secondary | ICD-10-CM | POA: Diagnosis present

## 2020-09-23 DIAGNOSIS — E872 Acidosis: Secondary | ICD-10-CM | POA: Diagnosis present

## 2020-09-23 DIAGNOSIS — Z801 Family history of malignant neoplasm of trachea, bronchus and lung: Secondary | ICD-10-CM

## 2020-09-23 DIAGNOSIS — R569 Unspecified convulsions: Secondary | ICD-10-CM

## 2020-09-23 DIAGNOSIS — N289 Disorder of kidney and ureter, unspecified: Secondary | ICD-10-CM

## 2020-09-23 DIAGNOSIS — G4089 Other seizures: Secondary | ICD-10-CM | POA: Diagnosis present

## 2020-09-23 DIAGNOSIS — F17210 Nicotine dependence, cigarettes, uncomplicated: Secondary | ICD-10-CM | POA: Diagnosis present

## 2020-09-23 DIAGNOSIS — C787 Secondary malignant neoplasm of liver and intrahepatic bile duct: Secondary | ICD-10-CM | POA: Diagnosis present

## 2020-09-23 DIAGNOSIS — C7800 Secondary malignant neoplasm of unspecified lung: Secondary | ICD-10-CM

## 2020-09-23 LAB — CBC WITH DIFFERENTIAL/PLATELET
Abs Immature Granulocytes: 0.11 10*3/uL — ABNORMAL HIGH (ref 0.00–0.07)
Basophils Absolute: 0.1 10*3/uL (ref 0.0–0.1)
Basophils Relative: 1 %
Eosinophils Absolute: 0.1 10*3/uL (ref 0.0–0.5)
Eosinophils Relative: 1 %
HCT: 55.1 % — ABNORMAL HIGH (ref 36.0–46.0)
Hemoglobin: 17.9 g/dL — ABNORMAL HIGH (ref 12.0–15.0)
Immature Granulocytes: 1 %
Lymphocytes Relative: 6 %
Lymphs Abs: 0.6 10*3/uL — ABNORMAL LOW (ref 0.7–4.0)
MCH: 32.7 pg (ref 26.0–34.0)
MCHC: 32.5 g/dL (ref 30.0–36.0)
MCV: 100.5 fL — ABNORMAL HIGH (ref 80.0–100.0)
Monocytes Absolute: 0.8 10*3/uL (ref 0.1–1.0)
Monocytes Relative: 7 %
Neutro Abs: 9.2 10*3/uL — ABNORMAL HIGH (ref 1.7–7.7)
Neutrophils Relative %: 84 %
Platelets: 185 10*3/uL (ref 150–400)
RBC: 5.48 MIL/uL — ABNORMAL HIGH (ref 3.87–5.11)
RDW: 13.8 % (ref 11.5–15.5)
WBC: 10.8 10*3/uL — ABNORMAL HIGH (ref 4.0–10.5)
nRBC: 0 % (ref 0.0–0.2)

## 2020-09-23 LAB — URINALYSIS, ROUTINE W REFLEX MICROSCOPIC
Bacteria, UA: NONE SEEN
Bilirubin Urine: NEGATIVE
Glucose, UA: >=500 mg/dL — AB
Hgb urine dipstick: NEGATIVE
Ketones, ur: NEGATIVE mg/dL
Leukocytes,Ua: NEGATIVE
Nitrite: NEGATIVE
Protein, ur: NEGATIVE mg/dL
Specific Gravity, Urine: 1.016 (ref 1.005–1.030)
pH: 6 (ref 5.0–8.0)

## 2020-09-23 LAB — COMPREHENSIVE METABOLIC PANEL
ALT: 114 U/L — ABNORMAL HIGH (ref 0–44)
AST: 44 U/L — ABNORMAL HIGH (ref 15–41)
Albumin: 4.2 g/dL (ref 3.5–5.0)
Alkaline Phosphatase: 93 U/L (ref 38–126)
Anion gap: 10 (ref 5–15)
BUN: 29 mg/dL — ABNORMAL HIGH (ref 6–20)
CO2: 26 mmol/L (ref 22–32)
Calcium: 9.7 mg/dL (ref 8.9–10.3)
Chloride: 98 mmol/L (ref 98–111)
Creatinine, Ser: 1.26 mg/dL — ABNORMAL HIGH (ref 0.44–1.00)
GFR, Estimated: 55 mL/min — ABNORMAL LOW (ref >60)
Glucose, Bld: 162 mg/dL — ABNORMAL HIGH (ref 70–99)
Potassium: 5.6 mmol/L — ABNORMAL HIGH (ref 3.5–5.1)
Sodium: 134 mmol/L — ABNORMAL LOW (ref 135–145)
Total Bilirubin: 1.7 mg/dL — ABNORMAL HIGH (ref 0.3–1.2)
Total Protein: 7.8 g/dL (ref 6.5–8.1)

## 2020-09-23 LAB — RESP PANEL BY RT-PCR (FLU A&B, COVID) ARPGX2
Influenza A by PCR: NEGATIVE
Influenza B by PCR: NEGATIVE
SARS Coronavirus 2 by RT PCR: NEGATIVE

## 2020-09-23 LAB — PREGNANCY, URINE: Preg Test, Ur: NEGATIVE

## 2020-09-23 LAB — LACTIC ACID, PLASMA
Lactic Acid, Venous: 2.6 mmol/L (ref 0.5–1.9)
Lactic Acid, Venous: 2.4 mmol/L (ref 0.5–1.9)

## 2020-09-23 LAB — PROTIME-INR
INR: 1 (ref 0.8–1.2)
Prothrombin Time: 12.8 seconds (ref 11.4–15.2)

## 2020-09-23 LAB — CBG MONITORING, ED: Glucose-Capillary: 134 mg/dL — ABNORMAL HIGH (ref 70–99)

## 2020-09-23 MED ORDER — SODIUM CHLORIDE 0.9 % IV SOLN
1.0000 g | Freq: Once | INTRAVENOUS | Status: AC
  Administered 2020-09-23: 1 g via INTRAVENOUS
  Filled 2020-09-23: qty 10

## 2020-09-23 MED ORDER — SODIUM CHLORIDE 0.9 % IV BOLUS
1000.0000 mL | Freq: Once | INTRAVENOUS | Status: AC
  Administered 2020-09-23: 1000 mL via INTRAVENOUS

## 2020-09-23 MED ORDER — HYDROCODONE-ACETAMINOPHEN 5-325 MG PO TABS
1.0000 | ORAL_TABLET | Freq: Once | ORAL | Status: AC
  Administered 2020-09-23: 1 via ORAL
  Filled 2020-09-23: qty 1

## 2020-09-23 MED ORDER — ACETAMINOPHEN 650 MG RE SUPP: 650.0000 mg | Freq: Four times a day (QID) | RECTAL | Status: DC | PRN

## 2020-09-23 MED ORDER — SENNOSIDES-DOCUSATE SODIUM 8.6-50 MG PO TABS
1.0000 | ORAL_TABLET | Freq: Two times a day (BID) | ORAL | Status: DC
  Administered 2020-09-24 – 2020-09-26 (×5): 1 via ORAL
  Filled 2020-09-23 (×11): qty 1

## 2020-09-23 MED ORDER — METRONIDAZOLE 500 MG PO TABS
500.0000 mg | ORAL_TABLET | Freq: Three times a day (TID) | ORAL | Status: DC
  Administered 2020-09-23 – 2020-09-24 (×2): 500 mg via ORAL
  Filled 2020-09-23 (×2): qty 1

## 2020-09-23 MED ORDER — IOHEXOL 300 MG/ML  SOLN
100.0000 mL | Freq: Once | INTRAMUSCULAR | Status: AC | PRN
  Administered 2020-09-23: 100 mL via INTRAVENOUS

## 2020-09-23 MED ORDER — DEXAMETHASONE 4 MG PO TABS
4.0000 mg | ORAL_TABLET | Freq: Every day | ORAL | Status: DC
  Administered 2020-09-23 – 2020-09-26 (×4): 4 mg via ORAL
  Filled 2020-09-23 (×4): qty 1

## 2020-09-23 MED ORDER — SODIUM CHLORIDE 0.9 % IV SOLN
2.0000 g | Freq: Three times a day (TID) | INTRAVENOUS | Status: DC
  Administered 2020-09-23 – 2020-09-25 (×5): 2 g via INTRAVENOUS
  Filled 2020-09-23 (×5): qty 2

## 2020-09-23 MED ORDER — VANCOMYCIN HCL 10 G IV SOLR
2500.0000 mg | Freq: Once | INTRAVENOUS | Status: AC
  Administered 2020-09-24: 2500 mg via INTRAVENOUS
  Filled 2020-09-23: qty 2500

## 2020-09-23 MED ORDER — GABAPENTIN 300 MG PO CAPS
300.0000 mg | ORAL_CAPSULE | Freq: Two times a day (BID) | ORAL | Status: DC
  Administered 2020-09-23 – 2020-09-26 (×6): 300 mg via ORAL
  Filled 2020-09-23 (×6): qty 1

## 2020-09-23 MED ORDER — INSULIN ASPART 100 UNIT/ML ~~LOC~~ SOLN
0.0000 [IU] | Freq: Every day | SUBCUTANEOUS | Status: DC
  Administered 2020-09-24 – 2020-09-25 (×2): 2 [IU] via SUBCUTANEOUS

## 2020-09-23 MED ORDER — INSULIN ASPART 100 UNIT/ML ~~LOC~~ SOLN
0.0000 [IU] | Freq: Three times a day (TID) | SUBCUTANEOUS | Status: DC
  Administered 2020-09-24: 5 [IU] via SUBCUTANEOUS
  Administered 2020-09-24: 2 [IU] via SUBCUTANEOUS
  Administered 2020-09-24 – 2020-09-25 (×3): 3 [IU] via SUBCUTANEOUS
  Administered 2020-09-25: 8 [IU] via SUBCUTANEOUS
  Administered 2020-09-26: 5 [IU] via SUBCUTANEOUS
  Administered 2020-09-26: 2 [IU] via SUBCUTANEOUS

## 2020-09-23 MED ORDER — ENOXAPARIN SODIUM 40 MG/0.4ML ~~LOC~~ SOLN
40.0000 mg | Freq: Two times a day (BID) | SUBCUTANEOUS | Status: DC
  Administered 2020-09-24 – 2020-09-26 (×5): 40 mg via SUBCUTANEOUS
  Filled 2020-09-23 (×5): qty 0.4

## 2020-09-23 MED ORDER — ENOXAPARIN SODIUM 40 MG/0.4ML ~~LOC~~ SOLN
40.0000 mg | SUBCUTANEOUS | Status: DC
  Filled 2020-09-23: qty 0.4

## 2020-09-23 MED ORDER — ACETAMINOPHEN 325 MG PO TABS
650.0000 mg | ORAL_TABLET | Freq: Four times a day (QID) | ORAL | Status: DC | PRN
  Administered 2020-09-24: 650 mg via ORAL
  Filled 2020-09-23: qty 2

## 2020-09-23 MED ORDER — ONDANSETRON HCL 4 MG/2ML IJ SOLN: 4.0000 mg | Freq: Four times a day (QID) | INTRAMUSCULAR | Status: DC | PRN

## 2020-09-23 MED ORDER — ONDANSETRON HCL 4 MG/2ML IJ SOLN
4.0000 mg | Freq: Once | INTRAMUSCULAR | Status: AC
  Administered 2020-09-23: 4 mg via INTRAVENOUS
  Filled 2020-09-23: qty 2

## 2020-09-23 MED ORDER — SODIUM CHLORIDE 0.9 % IV SOLN: INTRAVENOUS | Status: DC

## 2020-09-23 MED ORDER — SODIUM ZIRCONIUM CYCLOSILICATE 5 G PO PACK
10.0000 g | PACK | Freq: Once | ORAL | Status: AC
  Administered 2020-09-23: 10 g via ORAL
  Filled 2020-09-23: qty 2

## 2020-09-23 MED ORDER — LEVETIRACETAM 500 MG PO TABS
1000.0000 mg | ORAL_TABLET | Freq: Two times a day (BID) | ORAL | Status: DC
  Administered 2020-09-23 – 2020-09-26 (×6): 1000 mg via ORAL
  Filled 2020-09-23 (×6): qty 2

## 2020-09-23 MED ORDER — VANCOMYCIN HCL 1250 MG/250ML IV SOLN
1250.0000 mg | Freq: Two times a day (BID) | INTRAVENOUS | Status: DC
  Administered 2020-09-24: 1250 mg via INTRAVENOUS
  Filled 2020-09-23: qty 250

## 2020-09-23 MED ORDER — ONDANSETRON HCL 4 MG PO TABS: 4.0000 mg | ORAL_TABLET | Freq: Four times a day (QID) | ORAL | Status: DC | PRN

## 2020-09-23 MED ORDER — SODIUM CHLORIDE 0.9 % IV SOLN: INTRAVENOUS | Status: AC

## 2020-09-23 NOTE — ED Notes (Signed)
Date and time results received: 09/23/20 1331 (use smartphrase ".now" to insert current time)  Test: Lactic acid  Critical Value: 2.6  Name of Provider Notified: Evalee Jefferson   Orders Received? Or Actions Taken?:

## 2020-09-23 NOTE — Progress Notes (Signed)
Pharmacy Antibiotic Note  Tara Aguilar is a 39 y.o. female admitted on 09/23/2020 with sepsis.  Pharmacy has been consulted for Vancomycin and Cefepime dosing.  Plan: Cefepime 2gm IV q8h Vancomycin 2500 mg IV now then 1250mg  Q 12 hrs.  Will f/u renal function, micro data, and pt's clinical condition Vanc levels prn   Height: 5\' 5"  (165.1 cm) Weight: (!) 145.2 kg (320 lb) IBW/kg (Calculated) : 57  Temp (24hrs), Avg:97.9 F (36.6 C), Min:97.9 F (36.6 C), Max:97.9 F (36.6 C)  Recent Labs  Lab 09/19/20 0908 09/23/20 1303 09/23/20 1619  WBC 6.9 10.8*  --   CREATININE 0.80 1.26*  --   LATICACIDVEN  --  2.6* 2.4*    Estimated Creatinine Clearance: 85.6 mL/min (A) (by C-G formula based on SCr of 1.26 mg/dL (H)).    No Known Allergies  Antimicrobials this admission: 11/28 Ceftriaxone x 1 11/28 Vanc >>  11/28 Cefepime >>  11/28 Flagyl >>  Microbiology results: 11/28 BCx:  11/28 UCx:    Thank you for allowing pharmacy to be a part of this patient's care.  Sherlon Handing, PharmD, BCPS Please see amion for complete clinical pharmacist phone list 09/23/2020 10:34 PM

## 2020-09-23 NOTE — H&P (Addendum)
History and Physical    Tara Aguilar UKG:[id number] DOB: 1981/05/21 DOA: 09/23/2020  PCP: Glenda Chroman, MD   Patient coming from: Home  I have personally briefly reviewed patient's old medical records in [place]  Chief Complaint: Constipation, reduced urine output  HPI: Tara Aguilar is a 39 y.o. female with medical history significant for lung cancer, brain metastasis obesity, depression. Patient presented to the ED with complaints of constipation and inability to see urinate over the past 2 to 3 days.  She reports poor oral intake in the past 3 days also.  She denies pain with urination.  No abdominal pain, no chest pain. She reports nausea without vomiting.  No loose stools.  ED Course: Temperature 97.9.  Heart rate initially 121 improved to 102.  Blood pressure systolic 762G to 315V.  O2 sats greater than 92% on room air. WBC 10.8.  UA 6-10 WBCs greater than 500 glucose.  Lactic acid 2.6.  Potassium 5.6.  Creatinine 1.26.  Chest x-ray linear right base opacities favoring atelectasis.  Abdominal CT- new mass with indistinct margins hepatic segment 4B,  MRI abdomen recommended. 1 L bolus given, IV ceftriaxone for possible UTI given.  Lokelma 10 g given.  Hospitalist to admit for acute kidney injury and possible UTI.  Review of Systems: As per HPI all other systems reviewed and negative.  Past Medical History:  Diagnosis Date  . Cancer ([place])   . Depression   . Glucose intolerance 03/11/2019  . Obesity, Class III, BMI 40-49.9 (morbid obesity) ([place]) 03/11/2019    Past Surgical History:  Procedure Laterality Date  . CESAREAN SECTION    . IRRIGATION AND DEBRIDEMENT ABSCESS  03/15/2019   RETROPHARYNGEAL   . TONSILLECTOMY AND ADENOIDECTOMY N/A 03/15/2019   Procedure: IRRIGATION AND DEBRIDEMENT OF RETROPHARYNGEAL ABSCESS;  Surgeon: Leta Baptist, MD;  Location: [facility];  Service: ENT;  Laterality: N/A;     reports that she has been smoking cigarettes. She has a 25.00 pack-year  smoking history. She has never used smokeless tobacco. She reports previous alcohol use. She reports previous drug use.  No Known Allergies  Family History  Problem Relation Age of Onset  . Lung cancer Mother   . Hypertension Mother   . Leukemia Father   . Myelodysplastic syndrome Father     Prior to Admission medications   Medication Sig Start Date End Date Taking? Authorizing Provider  albuterol (PROVENTIL) (2.5 MG/3ML) 0.083% nebulizer solution Take 3 mLs (2.5 mg total) by nebulization every 2 (two) hours as needed for wheezing. 03/29/20   Roxan Hockey, MD  albuterol (VENTOLIN HFA) 108 (90 Base) MCG/ACT inhaler Inhale 2 puffs into the lungs every 6 (six) hours as needed for wheezing or shortness of breath. 03/29/20   Roxan Hockey, MD  blood glucose meter kit and supplies KIT Dispense based on patient and insurance preference. Use up to four times daily as directed. (FOR ICD-9 250.00, 250.01). 07/12/20   Kayleen Memos, DO  dexamethasone (DECADRON) 1 MG tablet Take by mouth. 08/27/20   [provider]  dexamethasone (DECADRON) 4 MG tablet Take 1 tablet (4 mg total) by mouth daily. 08/24/20   Ventura Sellers, MD  gabapentin (NEURONTIN) 300 MG capsule Take 300 mg by mouth 2 (two) times daily.  07/16/20   [provider]  insulin aspart (NOVOLOG) 100 UNIT/ML FlexPen Inject 10 Units into the skin 3 (three) times daily with meals. 07/12/20   Kayleen Memos, DO  insulin glargine (LANTUS)  100 UNIT/ML Solostar Pen Inject 50 Units into the skin daily. 07/12/20   Kayleen Memos, DO  levETIRAcetam (KEPPRA) 1000 MG tablet Take 1 tablet (1,000 mg total) by mouth 2 (two) times daily. 08/24/20   Ventura Sellers, MD    Physical Exam: Vitals:   09/23/20 1156 09/23/20 1430 09/23/20 1530 09/23/20 1600  BP:  (!) 130/101 (!) 165/101 (!) 164/108  Pulse:  (!) 111 (!) 113 (!) 103  Resp: (!) _0 Temp:      TempSrc:      SpO2:  97% 96% 96%  Weight:      Height:         Constitutional: Drowsy, lethargic, but answering questions appropriately. Vitals:   09/23/20 1156 09/23/20 1430 09/23/20 1530 09/23/20 1600  BP:  (!) 130/101 (!) 165/101 (!) 164/108  Pulse:  (!) 111 (!) 113 (!) 103  Resp: (!) _1 Temp:      TempSrc:      SpO2:  97% 96% 96%  Weight:      Height:       Eyes: Pupils mildly asymmetric, right pupil smaller than left but both reactive to light,, lids and conjunctivae normal ENMT: Mucous membranes are markedly dry Neck: normal, supple, no masses, no thyromegaly Respiratory: clear to auscultation bilaterally, Normal respiratory effort. No accessory muscle use.  Cardiovascular: Mild tachycardia, regular rate and rhythm,  No extremity edema. 2+ pedal pulses. Abdomen: Obese, no tenderness, no masses palpated. No hepatosplenomegaly.  Musculoskeletal: no clubbing / cyanosis. No joint deformity upper and lower extremities. Good ROM, no contractures. Normal muscle tone.  Skin: no rashes, lesions, ulcers. No induration Neurologic: No apparent cranial nerve abnormality, 4+5 strength bilateral upper and lower extremities. Psychiatric: Drowsy, lethargic, oriented x3, answering questions following directions, otherwise normal judgment and insight.   Labs on Admission: I have personally reviewed following labs and imaging studies  CBC: Recent Labs  Lab 09/19/20 0908 09/23/20 1303  WBC 6.9 10.8*  NEUTROABS 5.4 9.2*  HGB 16.7* 17.9*  HCT 51.8* 55.1*  MCV 102.0* 100.5*  PLT 205 622   Basic Metabolic Panel: Recent Labs  Lab 09/19/20 0908 09/23/20 1303  NA 139 134*  K 4.4 5.6*  CL 98 98  CO2 30 26  GLUCOSE 134* 162*  BUN 15 29*  CREATININE 0.80 1.26*  CALCIUM 9.3 9.7   Liver Function Tests: Recent Labs  Lab 09/19/20 0908 09/23/20 1303  AST 47* 44*  ALT 178* 114*  ALKPHOS 94 93  BILITOT 0.5 1.7*  PROT 7.0 7.8  ALBUMIN 4.0 4.2   Coagulation Profile: Recent Labs  Lab 09/23/20 1303  INR 1.0   Urine analysis:     Component Value Date/Time   COLORURINE YELLOW 09/23/2020 [place] 09/23/2020 1408   LABSPEC 1.016 09/23/2020 1408   PHURINE 6.0 09/23/2020 1408   GLUCOSEU >=500 (A) 09/23/2020 1408   HGBUR NEGATIVE 09/23/2020 1408   BILIRUBINUR NEGATIVE 09/23/2020 1408   KETONESUR NEGATIVE 09/23/2020 1408   PROTEINUR NEGATIVE 09/23/2020 1408   NITRITE NEGATIVE 09/23/2020 1408   LEUKOCYTESUR NEGATIVE 09/23/2020 1408    Radiological Exams on Admission: DG Chest 2 View  Result Date: 09/23/2020 CLINICAL DATA:  Suspected sepsis. EXAM: CHEST - 2 VIEW COMPARISON:  CT chest 06/12/2020.  Chest radiograph 03/28/2020. FINDINGS: Evaluation is limited by low lung volumes, technique, and patient body habitus. Similar chronic elevated right hemidiaphragm with linear right basilar opacities. No confluent consolidation. No visible pleural effusions  or pneumothorax. No acute osseous abnormality. Similar cardiomediastinal silhouette. Please refer to prior CT chest for description of right paratracheal/supraclavicular soft tissue. IMPRESSION: Evaluation is limited by low lung volumes, technique, and patient body habitus. 1. Elevated right hemidiaphragm with linear right basilar opacities, favor atelectasis. 2. Please refer to prior CT chest for description of right paratracheal/supraclavicular soft tissue. Additionally, please note that CT could provide more sensitive evaluation for metastatic disease in this patient with previously radiographically occult micronodularity. Electronically Signed   By: Margaretha Sheffield MD   On: 09/23/2020 12:58   DG Abdomen 1 View  Result Date: 09/23/2020 CLINICAL DATA:  Constipation. EXAM: ABDOMEN - 1 VIEW COMPARISON:  CT abdomen pelvis dated 05/29/2020. FINDINGS: The bowel gas pattern is normal. A moderate amount of stool is seen in the rectum. No radio-opaque calculi. Mild degenerative changes are seen in both hips. IMPRESSION: Nonobstructive bowel gas pattern. Electronically  Signed   By: Zerita Boers M.D.   On: 09/23/2020 13:54   CT ABDOMEN PELVIS W CONTRAST  Result Date: 09/23/2020 CLINICAL DATA:  Abdominal distension. 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 EXAM: CT ABDOMEN AND PELVIS WITH CONTRAST TECHNIQUE: Multidetector CT imaging of the abdomen and pelvis was performed using the standard protocol following bolus administration of intravenous contrast. CONTRAST:  186m OMNIPAQUE IOHEXOL 300 MG/ML  SOLN COMPARISON:  CT abdomen pelvis dated 05/29/2020. FINDINGS: Lower chest: Atelectasis of the right middle lobe is partially imaged. Hepatobiliary: A 4.2 cm mass measuring fluid attenuation is seen in hepatic segment 4B with surrounding segmental hyperenhancement. Hyperenhancement in this area is unchanged since 05/29/2020 and likely represents collateral blood flow due to superior vena cava obstruction. The mass is new from prior exam and the margins of the mass are slightly indistinct. No gallstones, gallbladder wall thickening, or biliary dilatation. Pancreas: Unremarkable. No pancreatic ductal dilatation or surrounding inflammatory changes. Spleen: Normal in size without focal abnormality. Adrenals/Urinary Tract: Adrenal glands are unremarkable. Kidneys are normal, without renal calculi, focal lesion, or hydronephrosis. The urinary bladder is deflated with a Foley catheter in place, however the urinary bladder wall appears mildly thickened with questionable endothelial hyperenhancement. Stomach/Bowel: Stomach is within normal limits. Appendix appears normal. No evidence of bowel wall thickening, distention, or inflammatory changes. Vascular/Lymphatic: No significant vascular findings are present. No enlarged abdominal or pelvic lymph nodes. Reproductive: Uterus and bilateral adnexa are unremarkable. Other: No abdominal wall hernia or abnormality. No abdominopelvic ascites. Musculoskeletal: No acute or  significant osseous findings. IMPRESSION: 1. New mass with indistinct margins measuring fluid attenuation in hepatic segment 4B. Differential considerations include metastatic disease and hepatic abscess. MR abdomen according to a liver protocol could be obtained to further evaluate this finding. 2. The urinary bladder wall appears mildly thickened with questionable endothelial hyperenhancement, however the urinary bladder is incompletely distended. These findings are nonspecific but may reflect cystitis. Electronically Signed   By: TZerita BoersM.D.   On: 09/23/2020 15:28    EKG: Independently reviewed.  Sinus tachycardia rate 108.  QTc 432.  No significant change from prior.  Assessment/Plan Principal Problem:   AKI (acute kidney injury) ([place] Active Problems:   Acute metabolic encephalopathy   Depression   Borderline diabetes   Mass of mediastinum   Brain metastasis ([place]   Focal seizures ([place]  Acute kidney injury-creatinine 1.26, baseline ~0.8, patient appears very dehydrated.  Also with lactic acidosis of 2.6.  Likely from poor oral intake. - 2  L bolus given cont N/s 100cc/hr x 20hrs -BMP in the morning -Monitor urine output  Acute metabolic encephalopathy-patient appearing drowsy, lethargic.  Likely from dehydration. Head CT-decreased left cerebral vasogenic edema which resolved mass-effect and midline shift.  No new intracranial abnormality.  Abdominal CT-new liver mass, suggests cystitis, but UA with just 6-10 WBC not convincing for urinary tract infection.  She denies dysuria -Follow-up urine cultures and blood cultures obtained in ED. -blood pressure now 93/67, with tachycardia , and lactic acidosis, will start broad-spectrum antibiotics with vancomycin cefepime and metronidazole, as she is immunocompromised.  New liver lesion, history of squamous cell carcinoma with brain metastasis-new mass in hepatic segment 4B.  Follows with Dr. Delton Coombes, currently on chemo/immunotherapy.   Per notes patient to start whole brain radiation therapy. -  MRI abdomen with liver protocol ordered as recommended.  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  Reports compliance with Keppra -Seizure precautions - Resume Keppra, resume Decadron per notes patient to start whole brain immunotherapy  Borderline diabetes-random glucose 162.  Home medication list has Lantus 50 units daily, patient reports she takes this when her blood sugars are elevated, she has not been taking this consistently as her blood sugars have not been high. - SSI- S - Hgba1c  Constipation -Bowel regimen   DVT prophylaxis: Lovenox Code Status: Full code Family Communication: None at bedside Disposition Plan:  ~ 1- 2 days Consults called: None Admission status:  Obs, tele    Bethena Roys MD [facility]  09/23/2020, 10:28 PM

## 2020-09-23 NOTE — ED Provider Notes (Signed)
[facility] EMERGENCY DEPARTMENT Provider Note   CSN: [id number] Arrival date & time: 09/23/20  1139     History Chief Complaint  Patient presents with  . Constipation    Tara Aguilar is a 39 y.o. female with a history of metastatic squamous cell lung cancer under the care of Dr. Delton Coombes, currently undergoing radiation and immunotherapy, was also started on Keytruda at her office visit 4 days ago presenting for evaluation of constipation and difficulty urinating.  She reports abdominal pain and distention and has not had a bowel movement in several days, also endorses pain and pressure at her rectum.  Her sister at her bedside also endorses she gave her an enema today without any results.  She also endorses having dysuria and reduced urinary out put, today has had no urine output.,  Although had incontinence of urine since arriving here.  She has had no fevers or chills, she has had increased generalized fatigue.  She is nauseated without emesis.  Denies chest pain, shortness of breath.  The history is provided by the patient and a relative (sister at bedside).       Past Medical History:  Diagnosis Date  . Cancer ([place])   . Depression   . Glucose intolerance 03/11/2019  . Obesity, Class III, BMI 40-49.9 (morbid obesity) ([place]) 03/11/2019    Patient Active Problem List   Diagnosis Date Noted  . AKI (acute kidney injury) ([place]) 09/23/2020  . 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 Brain metastasis ([place]) 07/04/2020  . Palliative care by specialist   . Lung cancer ([place]) 03/28/2020  . 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 Squamous cell lung cancer, unspecified laterality ([place]) 04/05/2019  . Goals of care, counseling/discussion 04/05/2019  . 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 Glucose intolerance  03/11/2019  . Obesity, Class III, BMI 40-49.9 (morbid obesity) ([place]) 03/11/2019  . Tobacco use 03/11/2019  . Dental abscess 03/07/2019  . 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 Depression     Past Surgical History:  Procedure Laterality Date  . CESAREAN SECTION    . IRRIGATION AND DEBRIDEMENT ABSCESS  03/15/2019   RETROPHARYNGEAL   . TONSILLECTOMY AND ADENOIDECTOMY N/A 03/15/2019   Procedure: IRRIGATION AND DEBRIDEMENT OF RETROPHARYNGEAL ABSCESS;  Surgeon: Leta Baptist, MD;  Location: [facility];  Service: ENT;  Laterality: N/A;     OB History   No obstetric history on file.     Family History  Problem Relation Age of Onset  . Lung cancer Mother   . Hypertension Mother   . Leukemia Father   . Myelodysplastic syndrome Father     Social History   Tobacco Use  . Smoking status: Current Some Day Smoker    Packs/day: 1.00    Years: 25.00    Pack years: 25.00    Types: Cigarettes  . Smokeless tobacco: Never Used  Vaping Use  . Vaping Use: Never used  Substance Use Topics  . Alcohol use: Not Currently  . Drug use: Not Currently    Home Medications Prior to Admission medications   Medication Sig Start Date End Date Taking? Authorizing Provider  albuterol (PROVENTIL) (2.5 MG/3ML) 0.083% nebulizer solution Take 3 mLs (2.5 mg total) by nebulization every 2 (two) hours as needed for wheezing. 03/29/20  Yes Roxan Hockey, MD  albuterol (VENTOLIN  HFA) 108 (90 Base) MCG/ACT inhaler Inhale 2 puffs into the lungs every 6 (six) hours as needed for wheezing or shortness of breath. 03/29/20  Yes Shon Hale, MD  blood glucose meter kit and supplies KIT Dispense based on patient and insurance preference. Use up to four times daily as directed. (FOR ICD-9 250.00, 250.01). 07/12/20  Yes Hall, Carole N, DO  dexamethasone (DECADRON) 1 MG tablet Take by mouth. 08/27/20  Yes [provider]  dexamethasone (DECADRON) 4 MG tablet Take 1 tablet (4 mg total)  by mouth daily. 08/24/20  Yes Vaslow, Georgeanna Lea, MD  gabapentin (NEURONTIN) 300 MG capsule Take 300 mg by mouth 2 (two) times daily.  07/16/20  Yes [provider]  insulin aspart (NOVOLOG) 100 UNIT/ML FlexPen Inject 10 Units into the skin 3 (three) times daily with meals. 07/12/20  Yes Hall, Carole N, DO  insulin glargine (LANTUS) 100 UNIT/ML Solostar Pen Inject 50 Units into the skin daily. 07/12/20  Yes Hall, Carole N, DO  levETIRAcetam (KEPPRA) 1000 MG tablet Take 1 tablet (1,000 mg total) by mouth 2 (two) times daily. 08/24/20  Yes Vaslow, Georgeanna Lea, MD    Allergies    Patient has no known allergies.  Review of Systems   Review of Systems  Constitutional: Positive for fatigue. Negative for chills and fever.  HENT: Negative.   Eyes: Negative.   Respiratory: Negative for chest tightness and shortness of breath.   Cardiovascular: Negative for chest pain.  Gastrointestinal: Positive for abdominal distention, abdominal pain and constipation. Negative for nausea.  Genitourinary: Positive for decreased urine volume.  Musculoskeletal: Negative for arthralgias, joint swelling and neck pain.  Skin: Negative.  Negative for rash and wound.  Neurological: Positive for weakness. Negative for dizziness, light-headedness, numbness and headaches.  Psychiatric/Behavioral: Negative.     Physical Exam Updated Vital Signs BP (!) 164/108   Pulse (!) 103   Temp 97.9 F (36.6 C) (Tympanic)   Resp 16   Ht 5\' 5"  (1.651 m)   Wt (!) 145.2 kg   SpO2 96%   BMI 53.25 kg/m   Physical Exam Vitals and nursing note reviewed. Exam conducted with a chaperone present.  Constitutional:      Appearance: She is well-developed. She is ill-appearing.  HENT:     Head: Normocephalic and atraumatic.     Mouth/Throat:     Pharynx: Oropharynx is clear.  Eyes:     Conjunctiva/sclera: Conjunctivae normal.  Cardiovascular:     Rate and Rhythm: Regular rhythm. Tachycardia present.     Heart sounds: Normal  heart sounds.  Pulmonary:     Effort: Pulmonary effort is normal.     Breath sounds: Normal breath sounds. No wheezing.  Abdominal:     General: Bowel sounds are normal. There is distension.     Palpations: Abdomen is soft.     Tenderness: There is abdominal tenderness. There is no guarding.  Genitourinary:    Rectum: No mass.     Comments: No fecal impaction Musculoskeletal:        General: Normal range of motion.     Cervical back: Normal range of motion.  Skin:    General: Skin is warm and dry.  Neurological:     General: No focal deficit present.     Mental Status: She is alert.     Motor: Weakness present.     ED Results / Procedures / Treatments   Labs (all labs ordered are listed, but only abnormal results are displayed)  Labs Reviewed  COMPREHENSIVE METABOLIC PANEL - Abnormal; Notable for the following components:      Result Value   Sodium 134 (*)    Potassium 5.6 (*)    Glucose, Bld 162 (*)    BUN 29 (*)    Creatinine, Ser 1.26 (*)    AST 44 (*)    ALT 114 (*)    Total Bilirubin 1.7 (*)    GFR, Estimated 55 (*)    All other components within normal limits  LACTIC ACID, PLASMA - Abnormal; Notable for the following components:   Lactic Acid, Venous 2.6 (*)    All other components within normal limits  CBC WITH DIFFERENTIAL/PLATELET - Abnormal; Notable for the following components:   WBC 10.8 (*)    RBC 5.48 (*)    Hemoglobin 17.9 (*)    HCT 55.1 (*)    MCV 100.5 (*)    Neutro Abs 9.2 (*)    Lymphs Abs 0.6 (*)    Abs Immature Granulocytes 0.11 (*)    All other components within normal limits  URINALYSIS, ROUTINE W REFLEX MICROSCOPIC - Abnormal; Notable for the following components:   Glucose, UA >=500 (*)    All other components within normal limits  CULTURE, BLOOD (ROUTINE X 2)  CULTURE, BLOOD (ROUTINE X 2)  RESP PANEL BY RT-PCR (FLU A&B, COVID) ARPGX2  URINE CULTURE  PROTIME-INR  PREGNANCY, URINE  LACTIC ACID, PLASMA     EKG None  Radiology DG Chest 2 View  Result Date: 09/23/2020 CLINICAL DATA:  Suspected sepsis. EXAM: CHEST - 2 VIEW COMPARISON:  CT chest 06/12/2020.  Chest radiograph 03/28/2020. FINDINGS: Evaluation is limited by low lung volumes, technique, and patient body habitus. Similar chronic elevated right hemidiaphragm with linear right basilar opacities. No confluent consolidation. No visible pleural effusions or pneumothorax. No acute osseous abnormality. Similar cardiomediastinal silhouette. Please refer to prior CT chest for description of right paratracheal/supraclavicular soft tissue. IMPRESSION: Evaluation is limited by low lung volumes, technique, and patient body habitus. 1. Elevated right hemidiaphragm with linear right basilar opacities, favor atelectasis. 2. Please refer to prior CT chest for description of right paratracheal/supraclavicular soft tissue. Additionally, please note that CT could provide more sensitive evaluation for metastatic disease in this patient with previously radiographically occult micronodularity. Electronically Signed   By: Feliberto Harts MD   On: 09/23/2020 12:58   DG Abdomen 1 View  Result Date: 09/23/2020 CLINICAL DATA:  Constipation. EXAM: ABDOMEN - 1 VIEW COMPARISON:  CT abdomen pelvis dated 05/29/2020. FINDINGS: The bowel gas pattern is normal. A moderate amount of stool is seen in the rectum. No radio-opaque calculi. Mild degenerative changes are seen in both hips. IMPRESSION: Nonobstructive bowel gas pattern. Electronically Signed   By: Romona Curls M.D.   On: 09/23/2020 13:54   CT ABDOMEN PELVIS W CONTRAST  Result Date: 09/23/2020 CLINICAL DATA:  Abdominal distension. 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 EXAM: CT ABDOMEN AND PELVIS WITH CONTRAST TECHNIQUE: Multidetector CT imaging of the abdomen and pelvis was performed using the standard protocol following bolus administration  of intravenous contrast. CONTRAST:  OMNIPAQUE IOHEXOL 300 MG/ML  SOLN COMPARISON:  CT abdomen pelvis dated 05/29/2020. FINDINGS: Lower chest: Atelectasis of the right middle lobe is partially imaged. Hepatobiliary: A 4.2 cm mass measuring fluid attenuation is seen in hepatic segment 4B with surrounding segmental hyperenhancement. Hyperenhancement in this area is unchanged since 05/29/2020 and likely represents collateral  blood flow due to superior vena cava obstruction. The mass is new from prior exam and the margins of the mass are slightly indistinct. No gallstones, gallbladder wall thickening, or biliary dilatation. Pancreas: Unremarkable. No pancreatic ductal dilatation or surrounding inflammatory changes. Spleen: Normal in size without focal abnormality. Adrenals/Urinary Tract: Adrenal glands are unremarkable. Kidneys are normal, without renal calculi, focal lesion, or hydronephrosis. The urinary bladder is deflated with a Foley catheter in place, however the urinary bladder wall appears mildly thickened with questionable endothelial hyperenhancement. Stomach/Bowel: Stomach is within normal limits. Appendix appears normal. No evidence of bowel wall thickening, distention, or inflammatory changes. Vascular/Lymphatic: No significant vascular findings are present. No enlarged abdominal or pelvic lymph nodes. Reproductive: Uterus and bilateral adnexa are unremarkable. Other: No abdominal wall hernia or abnormality. No abdominopelvic ascites. Musculoskeletal: No acute or significant osseous findings. IMPRESSION: 1. New mass with indistinct margins measuring fluid attenuation in hepatic segment 4B. Differential considerations include metastatic disease and hepatic abscess. MR abdomen according to a liver protocol could be obtained to further evaluate this finding. 2. The urinary bladder wall appears mildly thickened with questionable endothelial hyperenhancement, however the urinary bladder is incompletely  distended. These findings are nonspecific but may reflect cystitis. Electronically Signed   By: Romona Curls M.D.   On: 09/23/2020 15:28    Procedures Procedures (including critical care time)  Medications Ordered in ED Medications  sodium chloride 0.9 % bolus 1,000 mL (has no administration in time range)  sodium chloride 0.9 % bolus 1,000 mL (1,000 mLs Intravenous New Bag/Given 09/23/20 1308)  sodium chloride 0.9 % bolus 1,000 mL (1,000 mLs Intravenous New Bag/Given (Non-Interop) 09/23/20 1442)  ondansetron (ZOFRAN) injection 4 mg (4 mg Intravenous Given 09/23/20 1427)  cefTRIAXone (ROCEPHIN) 1 g in sodium chloride 0.9 % 100 mL IVPB (0 g Intravenous Stopped 09/23/20 1511)  iohexol (OMNIPAQUE) 300 MG/ML solution 100 mL (100 mLs Intravenous Contrast Given 09/23/20 1441)  sodium zirconium cyclosilicate (LOKELMA) packet 10 g (10 g Oral Given 09/23/20 1527)    ED Course  I have reviewed the triage vital signs and the nursing notes.  Pertinent labs & imaging results that were available during my care of the patient were reviewed by me and considered in my medical decision making (see chart for details).    MDM Rules/Calculators/A&P                          Labs and imaging reviewed and discussed with patient.  She has acute renal insufficiency with an elevation in her potassium at 5.6.  This is significantly increased from labs obtained just 3 days ago at which time her creatinine 0.8, her creatinine today is 1.26.  Her LFTs are elevated as they have been in the past and are stable.  Her CT scan reveals a new liver mass, suspicious for metastatic disease versus abscess.  Her lactic acid is elevated today at 2.6, she was given IV fluids, also given Rocephin given her urinary complaint to her CT scan does suggest a possible cystitis.  Pt given IV fluids,  Also treated hyperkalemia, rocephin given initial elevated lactate.    Discussed with Dr. Mariea Clonts who accepts pt for admission. Final  Clinical Impression(s) / ED Diagnoses Final diagnoses:  Generalized abdominal pain  Constipation, unspecified constipation type  Renal insufficiency  Hyperkalemia  Secondary carcinoma of lung, unspecified laterality ([facility])  Liver mass    Rx / DC Orders ED Discharge Orders    None  Evalee Jefferson, PA-C 09/23/20 1700    Milton Ferguson, MD 09/25/20 [phone]

## 2020-09-23 NOTE — ED Notes (Signed)
Bladder scan performed, 16 mLs in bladder

## 2020-09-23 NOTE — ED Triage Notes (Signed)
Pt brought to ED by sister. Pt is a cancer pt currently getting radiation. Pt has been constipated and also difficulty urinating x 1 week.

## 2020-09-24 ENCOUNTER — Encounter: Payer: Self-pay | Admitting: Internal Medicine

## 2020-09-24 ENCOUNTER — Ambulatory Visit: Payer: BC Managed Care – PPO

## 2020-09-24 ENCOUNTER — Telehealth: Payer: Self-pay | Admitting: *Deleted

## 2020-09-24 ENCOUNTER — Encounter (HOSPITAL_COMMUNITY): Payer: Self-pay

## 2020-09-24 ENCOUNTER — Observation Stay (HOSPITAL_COMMUNITY): Payer: BC Managed Care – PPO

## 2020-09-24 DIAGNOSIS — E860 Dehydration: Secondary | ICD-10-CM | POA: Diagnosis present

## 2020-09-24 DIAGNOSIS — C7970 Secondary malignant neoplasm of unspecified adrenal gland: Secondary | ICD-10-CM | POA: Diagnosis present

## 2020-09-24 DIAGNOSIS — G936 Cerebral edema: Secondary | ICD-10-CM | POA: Diagnosis present

## 2020-09-24 DIAGNOSIS — C7931 Secondary malignant neoplasm of brain: Secondary | ICD-10-CM

## 2020-09-24 DIAGNOSIS — E875 Hyperkalemia: Secondary | ICD-10-CM | POA: Diagnosis present

## 2020-09-24 DIAGNOSIS — R160 Hepatomegaly, not elsewhere classified: Secondary | ICD-10-CM | POA: Diagnosis not present

## 2020-09-24 DIAGNOSIS — Z801 Family history of malignant neoplasm of trachea, bronchus and lung: Secondary | ICD-10-CM | POA: Diagnosis not present

## 2020-09-24 DIAGNOSIS — R569 Unspecified convulsions: Secondary | ICD-10-CM

## 2020-09-24 DIAGNOSIS — C7800 Secondary malignant neoplasm of unspecified lung: Secondary | ICD-10-CM | POA: Diagnosis present

## 2020-09-24 DIAGNOSIS — D8481 Immunodeficiency due to conditions classified elsewhere: Secondary | ICD-10-CM | POA: Diagnosis present

## 2020-09-24 DIAGNOSIS — G4089 Other seizures: Secondary | ICD-10-CM | POA: Diagnosis present

## 2020-09-24 DIAGNOSIS — N3090 Cystitis, unspecified without hematuria: Secondary | ICD-10-CM | POA: Diagnosis present

## 2020-09-24 DIAGNOSIS — N179 Acute kidney failure, unspecified: Secondary | ICD-10-CM | POA: Diagnosis present

## 2020-09-24 DIAGNOSIS — E872 Acidosis: Secondary | ICD-10-CM | POA: Diagnosis present

## 2020-09-24 DIAGNOSIS — K5900 Constipation, unspecified: Secondary | ICD-10-CM | POA: Diagnosis present

## 2020-09-24 DIAGNOSIS — C771 Secondary and unspecified malignant neoplasm of intrathoracic lymph nodes: Secondary | ICD-10-CM | POA: Diagnosis present

## 2020-09-24 DIAGNOSIS — Z20822 Contact with and (suspected) exposure to covid-19: Secondary | ICD-10-CM | POA: Diagnosis present

## 2020-09-24 DIAGNOSIS — C3490 Malignant neoplasm of unspecified part of unspecified bronchus or lung: Secondary | ICD-10-CM | POA: Diagnosis present

## 2020-09-24 DIAGNOSIS — C7949 Secondary malignant neoplasm of other parts of nervous system: Secondary | ICD-10-CM | POA: Diagnosis present

## 2020-09-24 DIAGNOSIS — R7303 Prediabetes: Secondary | ICD-10-CM

## 2020-09-24 DIAGNOSIS — F32A Depression, unspecified: Secondary | ICD-10-CM | POA: Diagnosis present

## 2020-09-24 DIAGNOSIS — Z7189 Other specified counseling: Secondary | ICD-10-CM | POA: Diagnosis not present

## 2020-09-24 DIAGNOSIS — Z515 Encounter for palliative care: Secondary | ICD-10-CM | POA: Diagnosis not present

## 2020-09-24 DIAGNOSIS — G9341 Metabolic encephalopathy: Secondary | ICD-10-CM | POA: Diagnosis present

## 2020-09-24 DIAGNOSIS — Z66 Do not resuscitate: Secondary | ICD-10-CM | POA: Diagnosis not present

## 2020-09-24 DIAGNOSIS — C787 Secondary malignant neoplasm of liver and intrahepatic bile duct: Secondary | ICD-10-CM | POA: Diagnosis present

## 2020-09-24 LAB — BASIC METABOLIC PANEL
Anion gap: 6 (ref 5–15)
BUN: 21 mg/dL — ABNORMAL HIGH (ref 6–20)
CO2: 26 mmol/L (ref 22–32)
Calcium: 8.8 mg/dL — ABNORMAL LOW (ref 8.9–10.3)
Chloride: 105 mmol/L (ref 98–111)
Creatinine, Ser: 0.8 mg/dL (ref 0.44–1.00)
GFR, Estimated: >60 mL/min (ref >60)
Glucose, Bld: 149 mg/dL — ABNORMAL HIGH (ref 70–99)
Potassium: 4.5 mmol/L (ref 3.5–5.1)
Sodium: 137 mmol/L (ref 135–145)

## 2020-09-24 LAB — CBC
HCT: 47.8 % — ABNORMAL HIGH (ref 36.0–46.0)
Hemoglobin: 15.2 g/dL — ABNORMAL HIGH (ref 12.0–15.0)
MCH: 32.5 pg (ref 26.0–34.0)
MCHC: 31.8 g/dL (ref 30.0–36.0)
MCV: 102.4 fL — ABNORMAL HIGH (ref 80.0–100.0)
Platelets: 162 10*3/uL (ref 150–400)
RBC: 4.67 MIL/uL (ref 3.87–5.11)
RDW: 13.8 % (ref 11.5–15.5)
WBC: 8.5 10*3/uL (ref 4.0–10.5)
nRBC: 0 % (ref 0.0–0.2)

## 2020-09-24 LAB — GLUCOSE, CAPILLARY
Glucose-Capillary: 152 mg/dL — ABNORMAL HIGH (ref 70–99)
Glucose-Capillary: 125 mg/dL — ABNORMAL HIGH (ref 70–99)
Glucose-Capillary: 211 mg/dL — ABNORMAL HIGH (ref 70–99)
Glucose-Capillary: 206 mg/dL — ABNORMAL HIGH (ref 70–99)

## 2020-09-24 MED ORDER — VANCOMYCIN HCL 1000 MG IV SOLR
INTRAVENOUS | Status: AC
  Filled 2020-09-24: qty 2000

## 2020-09-24 MED ORDER — VANCOMYCIN HCL 500 MG IV SOLR
INTRAVENOUS | Status: AC
  Filled 2020-09-24: qty 500

## 2020-09-24 MED ORDER — CHLORHEXIDINE GLUCONATE CLOTH 2 % EX PADS
6.0000 | MEDICATED_PAD | Freq: Every day | CUTANEOUS | Status: DC
  Administered 2020-09-24 – 2020-09-26 (×2): 6 via TOPICAL

## 2020-09-24 NOTE — Telephone Encounter (Signed)
Received vm call from pt's sister requesting call back.  Tried once & unable to get through. Returned call @ 1pm & spoke with sister.  She is concerned that pt is in the hospital at [place] they plan to send her to [facility] for radiation.  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  Informed that I see HCPOA in chart but not living will.  She reports that she has living will.  Informed that she needs to talk with nurse at [facility] if pt & family do not want her to go to radiation to express that.  She expressed understanding.

## 2020-09-24 NOTE — Progress Notes (Addendum)
PROGRESS NOTE    [name]  GGE:[id number] DOB: 03/29/1981 DOA: 09/23/2020 PCP: Glenda Chroman, MD  Chief Complaint  Patient presents with  . Constipation    Brief Narrative:  As per H&P written by Dr. Denton Brick on 09/23/2020 Earlie Counts Tara Aguilar is a 39 y.o. female with medical history significant for lung cancer, brain metastasis obesity, depression. Patient presented to the ED with complaints of constipation and inability to see urine over the past 2 to 3 days.  She reports poor oral intake in the past 3 days also.  She denies pain with urination.  No abdominal pain, no chest pain. She reports nausea without vomiting.  No loose stools.  ED Course: Temperature 97.9.  Heart rate initially 121 improved to 102.  Blood pressure systolic 465K to 354S.  O2 sats greater than 92% on room air. WBC 10.8.  UA 6-10 WBCs greater than 500 glucose.  Lactic acid 2.6.  Potassium 5.6.  Creatinine 1.26.  Chest x-ray linear right base opacities favoring atelectasis.  Abdominal CT- new mass with indistinct margins hepatic segment 4B,  MRI abdomen recommended. 1 L bolus given, IV ceftriaxone for possible UTI given.  Lokelma 10 g given. Hospitalist to admit for acute kidney injury and possible UTI.   Assessment & Plan: 1-AKI (acute kidney injury) ([place]): In the setting of dehydration and prerenal azotemia -Will continue IV fluids -Continue to minimize nephrotoxic agents, avoid contrast and hypotension. -Follow electrolytes and renal function trend. -Renal function essentially back to normal after fluid resuscitation provided.    2-acute metabolic encephalopathy -In the setting of dehydration and underlying metastatic lesions to her brain. -Also with concern for UTI. -After discussing with family members (patient's sister, healthcare power of attorney).  Decision was made to keep patient comfortable and not to pursued any further radiation, chemotherapy or any intervention outside symptomatic  management. -Palliative care/hospice requested -Patient has declined radiation and chemotherapy; she will not be transferred to [facility].  3-metastatic squamous cell lung cancer -With new metastatic lesions appreciated in her liver -Patient also with metastases to adrenal glands and to the brain -At this moment after discussing goals of care and future intervention with patient's family decision was made for comfort and symptomatic management only -And oriented to receiving any further chemo or radiation -They would like to set up hospice services and get patient home.  4-focal seizures -Continue Keppra  5-borderline diabetes -Treated with the use of his steroids -Continue sliding scale insulin and Lantus.  6-constipation -Continue current bowel regimen and follow response. -Will maintain adequate hydration.  7-presumed UTI -Follow culture results -Continue cefepime -Discontinue the rest of broad-spectrum antibiotics.  8-DNR/DNI -patient code status will be changed after discussion with HCPOA and following patient's/family wishes.  DVT prophylaxis: Lovenox Code Status: DNR/DNI Family Communication: Patient's sister (healthcare power of attorney). Disposition:   Status is: Inpatient  Dispo: The patient is from: Home              Anticipated d/c is to: Home with hospice              Anticipated d/c date is: 1 day              Patient currently medically stable for discharge; but without a safe plan to facilitate proper care and symptomatic management at time of discharge.  Palliative care/hospice has been requested.  Family made aware and in agreement with plan.  No further radiation or chemotherapy.  Will hope for discharge tomorrow  Consultants:   Palliative care   Procedures:  See below for x-ray reports   Antimicrobials:  Cefepime.   Subjective: Lethargic, able to follow commands intermittently.  Reports no chest pain, no nausea or  vomiting.  Objective: Vitals:   09/24/20 0700 09/24/20 0715 09/24/20 0730 09/24/20 1151  BP: 119/77  111/72 125/83  Pulse: 83   84  Resp: 13 14 12 18   Temp:    97.9 F (36.6 C)  TempSrc:    Oral  SpO2: 97%   95%  Weight:      Height:        Intake/Output Summary (Last 24 hours) at 09/24/2020 1401 Last data filed at 09/24/2020 0540 Gross per 24 hour  Intake 4600 ml  Output 4600 ml  Net 0 ml   Filed Weights   09/23/20 1155  Weight: (!) 145.2 kg    Examination:  General exam: Morbidly obese, chronically ill in appearance; lethargic/somnolent but able to follow simple commands.  Remains intermittently confused as per nursing report. Respiratory system: Good air movement bilaterally; no using accessory muscles.  Normal respiratory effort. Cardiovascular system: S1 and S2 heard; RRR.  Unable to assess JVD with body habitus.  No rubs or gallops. Gastrointestinal system: Abdomen is obese, soft, nontender, positive bowel sounds. Central nervous system: Somnolent/lethargic; moving 4 limbs spontaneously.  No new deficit appreciated. Extremities: No cyanosis or clubbing. Skin: No petechiae Psychiatry: Mood & affect appropriate.     Data Reviewed: I have personally reviewed following labs and imaging studies  CBC: Recent Labs  Lab 09/19/20 0908 09/23/20 1303 09/24/20 0442  WBC 6.9 10.8* 8.5  NEUTROABS 5.4 9.2*  --   HGB 16.7* 17.9* 15.2*  HCT 51.8* 55.1* 47.8*  MCV 102.0* 100.5* 102.4*  PLT 205 185 409    Basic Metabolic Panel: Recent Labs  Lab 09/19/20 0908 09/23/20 1303 09/24/20 0442  NA 139 134* 137  K 4.4 5.6* 4.5  CL 98 98 105  CO2 30 26 26   GLUCOSE 134* 162* 149*  BUN 15 29* 21*  CREATININE 0.80 1.26* 0.80  CALCIUM 9.3 9.7 8.8*    GFR: Estimated Creatinine Clearance: 134.8 mL/min (by C-G formula based on SCr of 0.8 mg/dL).  Liver Function Tests: Recent Labs  Lab 09/19/20 0908 09/23/20 1303  AST 47* 44*  ALT 178* 114*  ALKPHOS 94 93  BILITOT  0.5 1.7*  PROT 7.0 7.8  ALBUMIN 4.0 4.2    CBG: Recent Labs  Lab 09/23/20 2156 09/24/20 0844 09/24/20 1109  GLUCAP 134* 152* 125*     Recent Results (from the past 240 hour(s))  Culture, blood (Routine x 2)     Status: None (Preliminary result)   Collection Time: 09/23/20  1:04 PM   Specimen: Right Antecubital; Blood  Result Value Ref Range Status   Specimen Description   Final    RIGHT ANTECUBITAL BOTTLES DRAWN AEROBIC AND ANAEROBIC   Special Requests Blood Culture adequate volume  Final   Culture   Final    NO GROWTH < 24 HOURS Performed at [facility], [address] [place], [place] [address]    Report Status PENDING  Incomplete  Culture, blood (Routine x 2)     Status: None (Preliminary result)   Collection Time: 09/23/20  1:27 PM   Specimen: Left Antecubital; Blood  Result Value Ref Range Status   Specimen Description   Final    LEFT ANTECUBITAL BOTTLES DRAWN AEROBIC AND ANAEROBIC   Special Requests Blood Culture adequate volume  Final   Culture   Final    NO GROWTH < 24 HOURS Performed at [facility], [address] [place], [place] [address]    Report Status PENDING  Incomplete  Resp Panel by RT-PCR (Flu A&B, Covid) Nasopharyngeal Swab     Status: None   Collection Time: 09/23/20  4:01 PM   Specimen: Nasopharyngeal Swab; Nasopharyngeal(NP) swabs in vial transport medium  Result Value Ref Range Status   SARS Coronavirus 2 by RT PCR NEGATIVE NEGATIVE Final    Comment: (NOTE) SARS-CoV-2 target nucleic acids are NOT DETECTED.  The SARS-CoV-2 RNA is generally detectable in upper respiratory specimens during the acute phase of infection. The lowest concentration of SARS-CoV-2 viral copies this assay can detect is 138 copies/mL. A negative result does not preclude SARS-Cov-2 infection and should not be used as the sole basis for treatment or other patient management decisions. A negative result may occur with  improper specimen collection/handling, submission  of specimen other than nasopharyngeal swab, presence of viral mutation(s) within the areas targeted by this assay, and inadequate number of viral copies(<138 copies/mL). A negative result must be combined with clinical observations, patient history, and epidemiological information. The expected result is Negative.  Fact Sheet for Patients:  EntrepreneurPulse.com.au  Fact Sheet for Healthcare Providers:  IncredibleEmployment.be  This test is no t yet approved or cleared by the [place] FDA and  has been authorized for detection and/or diagnosis of SARS-CoV-2 by FDA under an Emergency Use Authorization (EUA). This EUA will remain  in effect (meaning this test can be used) for the duration of the COVID-19 declaration under Section 564(b)(1) of the Act, 21 U.S.C.section 360bbb-3(b)(1), unless the authorization is terminated  or revoked sooner.       Influenza A by PCR NEGATIVE NEGATIVE Final   Influenza B by PCR NEGATIVE NEGATIVE Final    Comment: (NOTE) The Xpert Xpress SARS-CoV-2/FLU/RSV plus assay is intended as an aid in the diagnosis of influenza from Nasopharyngeal swab specimens and should not be used as a sole basis for treatment. Nasal washings and aspirates are unacceptable for Xpert Xpress SARS-CoV-2/FLU/RSV testing.  Fact Sheet for Patients: EntrepreneurPulse.com.au  Fact Sheet for Healthcare Providers: IncredibleEmployment.be  This test is not yet approved or cleared by the [place] FDA and has been authorized for detection and/or diagnosis of SARS-CoV-2 by FDA under an Emergency Use Authorization (EUA). This EUA will remain in effect (meaning this test can be used) for the duration of the COVID-19 declaration under Section 564(b)(1) of the Act, 21 U.S.C. section 360bbb-3(b)(1), unless the authorization is terminated or revoked.  Performed at [facility], [address],  [place], [place] [address]      Radiology Studies: DG Chest 2 View  Result Date: 09/23/2020 CLINICAL DATA:  Suspected sepsis. EXAM: CHEST - 2 VIEW COMPARISON:  CT chest 06/12/2020.  Chest radiograph 03/28/2020. FINDINGS: Evaluation is limited by low lung volumes, technique, and patient body habitus. Similar chronic elevated right hemidiaphragm with linear right basilar opacities. No confluent consolidation. No visible pleural effusions or pneumothorax. No acute osseous abnormality. Similar cardiomediastinal silhouette. Please refer to prior CT chest for description of right paratracheal/supraclavicular soft tissue. IMPRESSION: Evaluation is limited by low lung volumes, technique, and patient body habitus. 1. Elevated right hemidiaphragm with linear right basilar opacities, favor atelectasis. 2. Please refer to prior CT chest for description of right paratracheal/supraclavicular soft tissue. Additionally, please note that CT could provide more sensitive evaluation for metastatic disease in this patient with previously  radiographically occult micronodularity. Electronically Signed   By: Margaretha Sheffield MD   On: 09/23/2020 12:58   DG Abdomen 1 View  Result Date: 09/23/2020 CLINICAL DATA:  Constipation. EXAM: ABDOMEN - 1 VIEW COMPARISON:  CT abdomen pelvis dated 05/29/2020. FINDINGS: The bowel gas pattern is normal. A moderate amount of stool is seen in the rectum. No radio-opaque calculi. Mild degenerative changes are seen in both hips. IMPRESSION: Nonobstructive bowel gas pattern. Electronically Signed   By: Zerita Boers M.D.   On: 09/23/2020 13:54   CT HEAD WO CONTRAST  Result Date: 09/23/2020 CLINICAL DATA:  History of metastatic lung cancer with brain metastases. Mental status change with drowsiness/lethargy. EXAM: CT HEAD WITHOUT CONTRAST TECHNIQUE: Contiguous axial images were obtained from the base of the skull through the vertex without intravenous contrast. COMPARISON:  Head CT 08/22/2020 and  MRI 08/18/2020 FINDINGS: Brain: Known brain metastases are again seen in the lateral left parietal lobe with detailed assessment limited in the absence of IV contrast. There is mild to moderate regional vasogenic edema which has greatly decreased from the prior CT. Regional mass effect has resolved, and there is no residual midline shift. No new brain edema is evident. No acute cortically based infarct, intracranial hemorrhage, or extra-axial fluid collection is identified. The ventricles are normal in size. Vascular: No hyperdense vessel. Skull: No fracture or suspicious osseous lesion. Sinuses/Orbits: Mild mucosal thickening in the paranasal sinuses. Unchanged small volume fluid in the left sphenoid sinus. No significant mastoid fluid. Unremarkable orbits. Other: None. IMPRESSION: 1. Decreased left cerebral vasogenic edema with resolved mass effect and midline shift. 2. No evidence of new intracranial abnormality. Electronically Signed   By: Logan Bores M.D.   On: 09/23/2020 19:05   CT ABDOMEN PELVIS W CONTRAST  Result Date: 09/23/2020 CLINICAL DATA:  Abdominal distension. 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 EXAM: CT ABDOMEN AND PELVIS WITH CONTRAST TECHNIQUE: Multidetector CT imaging of the abdomen and pelvis was performed using the standard protocol following bolus administration of intravenous contrast. CONTRAST:  120mL OMNIPAQUE IOHEXOL 300 MG/ML  SOLN COMPARISON:  CT abdomen pelvis dated 05/29/2020. FINDINGS: Lower chest: Atelectasis of the right middle lobe is partially imaged. Hepatobiliary: A 4.2 cm mass measuring fluid attenuation is seen in hepatic segment 4B with surrounding segmental hyperenhancement. Hyperenhancement in this area is unchanged since 05/29/2020 and likely represents collateral blood flow due to superior vena cava obstruction. The mass is new from prior exam and the margins of the mass are slightly  indistinct. No gallstones, gallbladder wall thickening, or biliary dilatation. Pancreas: Unremarkable. No pancreatic ductal dilatation or surrounding inflammatory changes. Spleen: Normal in size without focal abnormality. Adrenals/Urinary Tract: Adrenal glands are unremarkable. Kidneys are normal, without renal calculi, focal lesion, or hydronephrosis. The urinary bladder is deflated with a Foley catheter in place, however the urinary bladder wall appears mildly thickened with questionable endothelial hyperenhancement. Stomach/Bowel: Stomach is within normal limits. Appendix appears normal. No evidence of bowel wall thickening, distention, or inflammatory changes. Vascular/Lymphatic: No significant vascular findings are present. No enlarged abdominal or pelvic lymph nodes. Reproductive: Uterus and bilateral adnexa are unremarkable. Other: No abdominal wall hernia or abnormality. No abdominopelvic ascites. Musculoskeletal: No acute or significant osseous findings. IMPRESSION: 1. New mass with indistinct margins measuring fluid attenuation in hepatic segment 4B. Differential considerations include metastatic disease and hepatic abscess. MR abdomen according to a liver protocol could be obtained to further evaluate this finding.  2. The urinary bladder wall appears mildly thickened with questionable endothelial hyperenhancement, however the urinary bladder is incompletely distended. These findings are nonspecific but may reflect cystitis. Electronically Signed   By: Zerita Boers M.D.   On: 09/23/2020 15:28    Scheduled Meds: . Chlorhexidine Gluconate Cloth  6 each Topical Daily  . dexamethasone  4 mg Oral Daily  . enoxaparin (LOVENOX) injection  40 mg Subcutaneous Q12H  . gabapentin  300 mg Oral BID  . insulin aspart  0-15 Units Subcutaneous TID WC  . insulin aspart  0-5 Units Subcutaneous QHS  . levETIRAcetam  1,000 mg Oral BID  . senna-docusate  1 tablet Oral BID   Continuous Infusions: . sodium  chloride 100 mL/hr at 09/24/20 0545  . ceFEPime (MAXIPIME) IV 2 g (09/24/20 0550)     LOS: 0 days    Time spent: 30 minutes    Barton Dubois, MD [facility]   To contact the attending provider between 7A-7P or the covering provider during after hours 7P-7A, please log into the web site [email or web address] and access using universal [place] password for that web site. If you do not have the password, please call the hospital operator.  09/24/2020, 2:01 PM

## 2020-09-24 NOTE — Progress Notes (Signed)
Sister whom is the patients HPOA is very upset that the MD had not spoken to her all day in regards to patients care. Paged and spoke to the on call hospitalist in regards to sisters concerns with patients plan of care. The on call hospitalist stated that he will look threw patients chart and see if he could speak to sister tonight before visiting hours are over.

## 2020-09-25 ENCOUNTER — Ambulatory Visit: Payer: BC Managed Care – PPO

## 2020-09-25 DIAGNOSIS — Z515 Encounter for palliative care: Secondary | ICD-10-CM

## 2020-09-25 DIAGNOSIS — Z7189 Other specified counseling: Secondary | ICD-10-CM

## 2020-09-25 DIAGNOSIS — C7800 Secondary malignant neoplasm of unspecified lung: Secondary | ICD-10-CM

## 2020-09-25 LAB — URINE CULTURE: Culture: NO GROWTH

## 2020-09-25 LAB — GLUCOSE, CAPILLARY
Glucose-Capillary: 171 mg/dL — ABNORMAL HIGH (ref 70–99)
Glucose-Capillary: 192 mg/dL — ABNORMAL HIGH (ref 70–99)
Glucose-Capillary: 267 mg/dL — ABNORMAL HIGH (ref 70–99)
Glucose-Capillary: 235 mg/dL — ABNORMAL HIGH (ref 70–99)

## 2020-09-25 MED ORDER — SALINE SPRAY 0.65 % NA SOLN: 1.0000 | NASAL | Status: DC | PRN

## 2020-09-25 MED ORDER — LORAZEPAM 0.5 MG PO TABS
0.5000 mg | ORAL_TABLET | ORAL | Status: DC | PRN
  Administered 2020-09-25: 0.5 mg via ORAL
  Filled 2020-09-25: qty 1

## 2020-09-25 MED ORDER — HYDROCODONE-ACETAMINOPHEN 5-325 MG PO TABS
1.0000 | ORAL_TABLET | ORAL | Status: DC | PRN
  Administered 2020-09-25: 1 via ORAL
  Administered 2020-09-25 (×2): 2 via ORAL
  Administered 2020-09-26: 1 via ORAL
  Filled 2020-09-25 (×2): qty 2
  Filled 2020-09-25: qty 1
  Filled 2020-09-25: qty 2

## 2020-09-25 MED ORDER — POLYETHYLENE GLYCOL 3350 17 G PO PACK
17.0000 g | PACK | Freq: Every day | ORAL | Status: DC | PRN
  Administered 2020-09-25: 17 g via ORAL
  Filled 2020-09-25: qty 1

## 2020-09-25 NOTE — Progress Notes (Signed)
PROGRESS NOTE    Tara Aguilar  CBS:[id number] DOB: August 25, 1981 DOA: 09/23/2020 PCP: Glenda Chroman, MD  Chief Complaint  Patient presents with  . Constipation    Brief Narrative:  As per H&P written by Dr. Denton Brick on 09/23/2020 Tara Aguilar is a 39 y.o. female with medical history significant for lung cancer, brain metastasis obesity, depression. Patient presented to the ED with complaints of constipation and inability to see urine over the past 2 to 3 days.  She reports poor oral intake in the past 3 days also.  She denies pain with urination.  No abdominal pain, no chest pain. She reports nausea without vomiting.  No loose stools.  ED Course: Temperature 97.9.  Heart rate initially 121 improved to 102.  Blood pressure systolic 846K to 599J.  O2 sats greater than 92% on room air. WBC 10.8.  UA 6-10 WBCs greater than 500 glucose.  Lactic acid 2.6.  Potassium 5.6.  Creatinine 1.26.  Chest x-ray linear right base opacities favoring atelectasis.  Abdominal CT- new mass with indistinct margins hepatic segment 4B,  MRI abdomen recommended. 1 L bolus given, IV ceftriaxone for possible UTI given.  Lokelma 10 g given. Hospitalist to admit for acute kidney injury and possible UTI.   Assessment & Plan: 1-AKI (acute kidney injury) ([place]): In the setting of dehydration and prerenal azotemia -Will continue maintain adequate hydration.  -Continue to minimize nephrotoxic agents, avoid contrast and hypotension. -Renal function essentially back to normal after fluid resuscitation provided.   -At this moment no further blood work anticipating or focusing on symptomatic management and comfort.  2-acute metabolic encephalopathy -In the setting of dehydration and underlying metastatic lesions to her brain. -Also with concern for UTI. -Mentation is now improved and back to baseline. -Patient advised to maintain adequate oral hydration -Has completed 3 days of cefepime and antibiotics will be  discontinued. -Continue to follow symptomatic management and comfort care. -Foley catheter will be removed for voiding trials purposes; if patient ended failing to void on her own will need to be discharge with foley in place.  -No further radiation or chemotherapy desired.  3-metastatic squamous cell lung cancer -With new metastatic lesions appreciated in her liver -Appreciate goals of care discussion and advance care planning by palliative care -Decision made to pursue comfort and symptomatic management only -Patient to be discharged home with hospice -She will require equipment and [facility] service has been made aware to assist with necessities. -Continue providing supportive care and follow symptomatic management.  4-focal seizures -Continue Keppra  5-borderline diabetes -Most likely associated with the use of her steroids treatment. -Continue sliding scale insulin and Lantus.  6-constipation -Continue to maintain adequate hydration -Continue Senokot and add MiraLAX for constipation. -If needed will add low-dose Linzess on daily basis.  7-presumed UTI -No growth appreciated -Patient afebrile and with resolution of her altered mental status -Advised to maintain adequate hydration -Has completed 3 days of cefepime.  8-DNR/DNI -patient code status will be changed after discussion with HCPOA and following patient's/family wishes.  DVT prophylaxis: Lovenox Code Status: DNR/DNI Family Communication: No family at bedside. Disposition:   Status is: Inpatient  Dispo: The patient is from: Home              Anticipated d/c is to: Home with hospice              Anticipated d/c date is: 1 day              Patient  currently medically stable for discharge; but she will need a equipment to be taken care of at home.  Will stop IV antibiotics after 3 days of therapy; no cultures growth seen.  Mentation back to baseline.  Advised to maintain adequate hydration and will remove Foley catheter  for voiding trials; if patient fails voiding on her own will require to be discharge with catheter in place.       Consultants:   Palliative care   Procedures:  See below for x-ray reports   Antimicrobials:  Cefepime last dose 11/30  Subjective: Afebrile, no chest pain, no nausea, no vomiting.  Mentation back to baseline following commands appropriately and reporting no dysuria currently.  Patient expressed having constipation.  Objective: Vitals:   09/24/20 2127 09/24/20 2135 09/25/20 0542 09/25/20 1313  BP:  111/83 111/78 133/83  Pulse:  98 72 100  Resp:    17  Temp:   97.7 F (36.5 C) 97.7 F (36.5 C)  TempSrc:    Oral  SpO2: 92% 98% 100% 90%  Weight:      Height:        Intake/Output Summary (Last 24 hours) at 09/25/2020 1431 Last data filed at 09/25/2020 1300 Gross per 24 hour  Intake 5245.43 ml  Output 700 ml  Net 4545.43 ml   Filed Weights   09/23/20 1155  Weight: (!) 145.2 kg    Examination: General exam: Alert, awake, oriented x 3, following commands appropriately reporting no chest pain, no nausea, no vomiting or shortness of breath.  Reports having constipation. Respiratory system: Clear to auscultation. Respiratory effort normal. Cardiovascular system:RRR. No rubs or gallops; unable to assess JVD with body habitus. Gastrointestinal system: Abdomen is obese, nondistended, soft and currently nontender on palpation. Normal bowel sounds heard. Central nervous system: Alert and oriented. No focal neurological deficits. Extremities: No cyanosis or clubbing. Skin: No petechiae. Psychiatry: Judgement and insight appear normal. Mood & affect appropriate.    Data Reviewed: I have personally reviewed following labs and imaging studies  CBC: Recent Labs  Lab 09/19/20 0908 09/23/20 1303 09/24/20 0442  WBC 6.9 10.8* 8.5  NEUTROABS 5.4 9.2*  --   HGB 16.7* 17.9* 15.2*  HCT 51.8* 55.1* 47.8*  MCV 102.0* 100.5* 102.4*  PLT 205 185 162    Basic  Metabolic Panel: Recent Labs  Lab 09/19/20 0908 09/23/20 1303 09/24/20 0442  NA 139 134* 137  K 4.4 5.6* 4.5  CL 98 98 105  CO2 30 26 26   GLUCOSE 134* 162* 149*  BUN 15 29* 21*  CREATININE 0.80 1.26* 0.80  CALCIUM 9.3 9.7 8.8*    GFR: Estimated Creatinine Clearance: 134.8 mL/min (by C-G formula based on SCr of 0.8 mg/dL).  Liver Function Tests: Recent Labs  Lab 09/19/20 0908 09/23/20 1303  AST 47* 44*  ALT 178* 114*  ALKPHOS 94 93  BILITOT 0.5 1.7*  PROT 7.0 7.8  ALBUMIN 4.0 4.2    CBG: Recent Labs  Lab 09/24/20 1109 09/24/20 1728 09/24/20 2109 09/25/20 0722 09/25/20 1118  GLUCAP 125* 211* 206* 171* 192*     Recent Results (from the past 240 hour(s))  Culture, blood (Routine x 2)     Status: None (Preliminary result)   Collection Time: 09/23/20  1:04 PM   Specimen: Right Antecubital; Blood  Result Value Ref Range Status   Specimen Description   Final    RIGHT ANTECUBITAL BOTTLES DRAWN AEROBIC AND ANAEROBIC   Special Requests Blood Culture adequate volume  Final  Culture   Final    NO GROWTH 2 DAYS Performed at [facility], [address] [place], [place] [address]    Report Status PENDING  Incomplete  Culture, blood (Routine x 2)     Status: None (Preliminary result)   Collection Time: 09/23/20  1:27 PM   Specimen: Left Antecubital; Blood  Result Value Ref Range Status   Specimen Description   Final    LEFT ANTECUBITAL BOTTLES DRAWN AEROBIC AND ANAEROBIC   Special Requests Blood Culture adequate volume  Final   Culture   Final    NO GROWTH 2 DAYS Performed at [facility], [address], [place], [place] [address]    Report Status PENDING  Incomplete  Resp Panel by RT-PCR (Flu A&B, Covid) Nasopharyngeal Swab     Status: None   Collection Time: 09/23/20  4:01 PM   Specimen: Nasopharyngeal Swab; Nasopharyngeal(NP) swabs in vial transport medium  Result Value Ref Range Status   SARS Coronavirus 2 by RT PCR NEGATIVE NEGATIVE Final    Comment:  (NOTE) SARS-CoV-2 target nucleic acids are NOT DETECTED.  The SARS-CoV-2 RNA is generally detectable in upper respiratory specimens during the acute phase of infection. The lowest concentration of SARS-CoV-2 viral copies this assay can detect is 138 copies/mL. A negative result does not preclude SARS-Cov-2 infection and should not be used as the sole basis for treatment or other patient management decisions. A negative result may occur with  improper specimen collection/handling, submission of specimen other than nasopharyngeal swab, presence of viral mutation(s) within the areas targeted by this assay, and inadequate number of viral copies(<138 copies/mL). A negative result must be combined with clinical observations, patient history, and epidemiological information. The expected result is Negative.  Fact Sheet for Patients:  EntrepreneurPulse.com.au  Fact Sheet for Healthcare Providers:  IncredibleEmployment.be  This test is no t yet approved or cleared by the [place] FDA and  has been authorized for detection and/or diagnosis of SARS-CoV-2 by FDA under an Emergency Use Authorization (EUA). This EUA will remain  in effect (meaning this test can be used) for the duration of the COVID-19 declaration under Section 564(b)(1) of the Act, 21 U.S.C.section 360bbb-3(b)(1), unless the authorization is terminated  or revoked sooner.       Influenza A by PCR NEGATIVE NEGATIVE Final   Influenza B by PCR NEGATIVE NEGATIVE Final    Comment: (NOTE) The Xpert Xpress SARS-CoV-2/FLU/RSV plus assay is intended as an aid in the diagnosis of influenza from Nasopharyngeal swab specimens and should not be used as a sole basis for treatment. Nasal washings and aspirates are unacceptable for Xpert Xpress SARS-CoV-2/FLU/RSV testing.  Fact Sheet for Patients: EntrepreneurPulse.com.au  Fact Sheet for Healthcare  Providers: IncredibleEmployment.be  This test is not yet approved or cleared by the [place] FDA and has been authorized for detection and/or diagnosis of SARS-CoV-2 by FDA under an Emergency Use Authorization (EUA). This EUA will remain in effect (meaning this test can be used) for the duration of the COVID-19 declaration under Section 564(b)(1) of the Act, 21 U.S.C. section 360bbb-3(b)(1), unless the authorization is terminated or revoked.  Performed at [facility], [address], [place], [place] [address]   Urine Culture     Status: None   Collection Time: 09/23/20  4:03 PM   Specimen: Urine, Catheterized  Result Value Ref Range Status   Specimen Description   Final    URINE, CATHETERIZED Performed at [facility], [address], [place], [place] [address]  Special Requests   Final    NONE Performed at [facility], [address], [place], [place] [address]    Culture   Final    NO GROWTH Performed at [facility], [place] [address], [place], [place] [address]    Report Status 09/25/2020 FINAL  Final     Radiology Studies: CT HEAD WO CONTRAST  Result Date: 09/23/2020 CLINICAL DATA:  History of metastatic lung cancer with brain metastases. Mental status change with drowsiness/lethargy. EXAM: CT HEAD WITHOUT CONTRAST TECHNIQUE: Contiguous axial images were obtained from the base of the skull through the vertex without intravenous contrast. COMPARISON:  Head CT 08/22/2020 and MRI 08/18/2020 FINDINGS: Brain: Known brain metastases are again seen in the lateral left parietal lobe with detailed assessment limited in the absence of IV contrast. There is mild to moderate regional vasogenic edema which has greatly decreased from the prior CT. Regional mass effect has resolved, and there is no residual midline shift. No new brain edema is evident. No acute cortically based infarct, intracranial hemorrhage, or extra-axial fluid collection is identified. The  ventricles are normal in size. Vascular: No hyperdense vessel. Skull: No fracture or suspicious osseous lesion. Sinuses/Orbits: Mild mucosal thickening in the paranasal sinuses. Unchanged small volume fluid in the left sphenoid sinus. No significant mastoid fluid. Unremarkable orbits. Other: None. IMPRESSION: 1. Decreased left cerebral vasogenic edema with resolved mass effect and midline shift. 2. No evidence of new intracranial abnormality. Electronically Signed   By: Logan Bores M.D.   On: 09/23/2020 19:05   CT ABDOMEN PELVIS W CONTRAST  Result Date: 09/23/2020 CLINICAL DATA:  Abdominal distension. 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 EXAM: CT ABDOMEN AND PELVIS WITH CONTRAST TECHNIQUE: Multidetector CT imaging of the abdomen and pelvis was performed using the standard protocol following bolus administration of intravenous contrast. CONTRAST:  144mL OMNIPAQUE IOHEXOL 300 MG/ML  SOLN COMPARISON:  CT abdomen pelvis dated 05/29/2020. FINDINGS: Lower chest: Atelectasis of the right middle lobe is partially imaged. Hepatobiliary: A 4.2 cm mass measuring fluid attenuation is seen in hepatic segment 4B with surrounding segmental hyperenhancement. Hyperenhancement in this area is unchanged since 05/29/2020 and likely represents collateral blood flow due to superior vena cava obstruction. The mass is new from prior exam and the margins of the mass are slightly indistinct. No gallstones, gallbladder wall thickening, or biliary dilatation. Pancreas: Unremarkable. No pancreatic ductal dilatation or surrounding inflammatory changes. Spleen: Normal in size without focal abnormality. Adrenals/Urinary Tract: Adrenal glands are unremarkable. Kidneys are normal, without renal calculi, focal lesion, or hydronephrosis. The urinary bladder is deflated with a Foley catheter in place, however the urinary bladder wall appears mildly thickened with  questionable endothelial hyperenhancement. Stomach/Bowel: Stomach is within normal limits. Appendix appears normal. No evidence of bowel wall thickening, distention, or inflammatory changes. Vascular/Lymphatic: No significant vascular findings are present. No enlarged abdominal or pelvic lymph nodes. Reproductive: Uterus and bilateral adnexa are unremarkable. Other: No abdominal wall hernia or abnormality. No abdominopelvic ascites. Musculoskeletal: No acute or significant osseous findings. IMPRESSION: 1. New mass with indistinct margins measuring fluid attenuation in hepatic segment 4B. Differential considerations include metastatic disease and hepatic abscess. MR abdomen according to a liver protocol could be obtained to further evaluate this finding. 2. The urinary bladder wall appears mildly thickened with questionable endothelial hyperenhancement, however the urinary bladder is incompletely distended. These findings are nonspecific but may reflect cystitis. Electronically Signed   By:  Zerita Boers M.D.   On: 09/23/2020 15:28    Scheduled Meds: . Chlorhexidine Gluconate Cloth  6 each Topical Daily  . dexamethasone  4 mg Oral Daily  . enoxaparin (LOVENOX) injection  40 mg Subcutaneous Q12H  . gabapentin  300 mg Oral BID  . insulin aspart  0-15 Units Subcutaneous TID WC  . insulin aspart  0-5 Units Subcutaneous QHS  . levETIRAcetam  1,000 mg Oral BID  . senna-docusate  1 tablet Oral BID   Continuous Infusions:    LOS: 1 day    Time spent: 30 minutes    Barton Dubois, MD [facility]   To contact the attending provider between 7A-7P or the covering provider during after hours 7P-7A, please log into the web site [email or web address] and access using universal [place] password for that web site. If you do not have the password, please call the hospital operator.  09/25/2020, 2:31 PM

## 2020-09-25 NOTE — Evaluation (Signed)
Physical Therapy Evaluation Patient Details Name: Tara Aguilar MRN: [id number] DOB: [birth year] Today's Date: 09/25/2020   History of Present Illness  Tara Aguilar is a 39 y.o. female with medical history significant for lung cancer, brain metastasis obesity, depression.Patient presented to the ED with complaints of constipation and inability to see urinate over the past 2 to 3 days.  She reports poor oral intake in the past 3 days also.  She denies pain with urination.  No abdominal pain, no chest pain. She reports nausea without vomiting.  No loose stools.    Clinical Impression  Patient demonstrates slow labored movement for sitting up at bedside having to use bed rail and c/o discomfort over buttocks.  Patient has to lean on nearby objects for support when using quad-cane to take steps, demonstrates slow labored movement and limited to a few steps at bedside due to c/o fatigue/generalized weakness.  Patient on room air throughout visit with Spo2 at 97% while sitting in chair - RN notified.  Patient will benefit from continued physical therapy in hospital and recommended venue below to increase strength, balance, endurance for safe ADLs and gait.     Follow Up Recommendations SNF;Supervision for mobility/OOB;Supervision - Intermittent    Equipment Recommendations  Rolling walker with 5" wheels    Recommendations for Other Services       Precautions / Restrictions Precautions Precautions: Fall      Mobility  Bed Mobility Overal bed mobility: Needs Assistance Bed Mobility: Supine to Sit     Supine to sit: Supervision;HOB elevated     General bed mobility comments: slow labored movement with head of bed raised    Transfers Overall transfer level: Needs assistance Equipment used: Quad cane Transfers: Sit to/from [facility] Sit to Stand: Min assist Stand pivot transfers: Min assist       General transfer comment: increased time, labored movement, has to  lean on nearby objects for support using quad-cane  Ambulation/Gait Ambulation/Gait assistance: Min assist;Mod assist Gait Distance (Feet): 5 Feet Assistive device: Quad cane Gait Pattern/deviations: Decreased step length - right;Decreased step length - left;Decreased stride length;Step-to pattern Gait velocity: decreased   General Gait Details: had to lean on bed rail for support when using quad-cane and limited to a few steps to transfer to chair due to c/o fatigue, on room air with SpO2 at 95%  Stairs            Wheelchair Mobility    Modified Rankin (Stroke Patients Only)       Balance Overall balance assessment: Needs assistance Sitting-balance support: Feet supported;No upper extremity supported Sitting balance-Leahy Scale: Good Sitting balance - Comments: seated at EOB   Standing balance support: During functional activity;Single extremity supported Standing balance-Leahy Scale: Poor Standing balance comment: using Quad-cane                             Pertinent Vitals/Pain Pain Assessment: 0-10 Pain Score: 7  Pain Location: buttocks Pain Descriptors / Indicators: Sore Pain Intervention(s): Limited activity within patient's tolerance;Monitored during session;Repositioned    Home Living Family/patient expects to be discharged to:: Private residence Living Arrangements: Children;Other (Comment);Spouse/significant other Available Help at Discharge: Family;Available PRN/intermittently Type of Home: House Home Access: Level entry     Home Layout: Able to live on main level with bedroom/bathroom;Two level Home Equipment: [place] - quad;Other (comment) (pushes a cart with wheels)      Prior Function Level of Independence:  Independent with assistive device(s);Needs assistance   Gait / Transfers Assistance Needed: household ambulator using Quad-cane  ADL's / Homemaking Assistance Needed: assisted by family for community ADLs        Hand  Dominance   Dominant Hand: Right    Extremity/Trunk Assessment   Upper Extremity Assessment Upper Extremity Assessment: Generalized weakness    Lower Extremity Assessment Lower Extremity Assessment: Generalized weakness    Cervical / Trunk Assessment Cervical / Trunk Assessment: Normal  Communication   Communication: No difficulties  Cognition Arousal/Alertness: Awake/alert Behavior During Therapy: WFL for tasks assessed/performed Overall Cognitive Status: Within Functional Limits for tasks assessed                                        General Comments      Exercises     Assessment/Plan    PT Assessment Patient needs continued PT services  PT Problem List Decreased strength;Decreased activity tolerance;Decreased balance;Decreased mobility       PT Treatment Interventions Balance training;DME instruction;Gait training;Stair training;Functional mobility training;Therapeutic activities;Therapeutic exercise;Patient/family education    PT Goals (Current goals can be found in the Care Plan section)  Acute Rehab PT Goals Patient Stated Goal: return home with family to assist PT Goal Formulation: With patient Time For Goal Achievement: 10/09/20 Potential to Achieve Goals: Good    Frequency Min 3X/week   Barriers to discharge        Co-evaluation               AM-PAC PT "6 Clicks" Mobility  Outcome Measure Help needed turning from your back to your side while in a flat bed without using bedrails?: None Help needed moving from lying on your back to sitting on the side of a flat bed without using bedrails?: A Little Help needed moving to and from a bed to a chair (including a wheelchair)?: A Little Help needed standing up from a chair using your arms (e.g., wheelchair or bedside chair)?: A Little Help needed to walk in hospital room?: A Lot Help needed climbing 3-5 steps with a railing? : Total 6 Click Score: 16    End of Session    Activity Tolerance: Patient tolerated treatment well;Patient limited by fatigue Patient left: in chair;with call bell/phone within reach Nurse Communication: Mobility status PT Visit Diagnosis: Unsteadiness on feet (R26.81);Other abnormalities of gait and mobility (R26.89);Muscle weakness (generalized) (M62.81)    Time: 1610-9604 PT Time Calculation (min) (ACUTE ONLY): 27 min   Charges:   PT Evaluation $PT Eval Moderate Complexity: 1 Mod PT Treatments $Therapeutic Activity: 23-37 mins        4:26 PM, 09/25/20 Lonell Grandchild, MPT Physical Therapist with [facility] [phone] office [phone] mobile phone

## 2020-09-25 NOTE — TOC Initial Note (Signed)
Transition of Care [facility]) - Initial/Assessment Note    Patient Details  Name: Tara Aguilar MRN: [id number] Date of Birth: [birth year]  Transition of Care [facility]) CM/SW Contact:    Boneta Lucks, RN Phone Number: 09/25/2020, 3:44 PM  Clinical Narrative:      Palliative consult complete, consulted TOC to refer to [facility] for Home.  Patient will need hospital bed, bedside commode, oxygen, over the bed table. Larey Seat accepted the referral.  Due to patient needing a Bariatric bed they can not deliver until tomorrow at [place].  MD and RN updated to plan for discharge 12/1.             Expected Discharge Plan: Home w Hospice Care Barriers to Discharge: Other (comment) (Hosp bed will be delivered to home tomorrow.)   Patient Goals and CMS Choice Patient states their goals for this hospitalization and ongoing recovery are:: to go home with hospice. CMS Medicare.gov Compare Post Acute Care list provided to:: Patient    Expected Discharge Plan and Services Expected Discharge Plan: Home w Hospice Care In-house Referral: Hospice / [place] arrangements for the past 2 months: Single Family Home                 DME Arranged: Hospice Equipment Package Others      [facility] Agency: [facility] Date [facility] Agency Contacted: 09/25/20 Time HH Agency Contacted: 1250 Representative spoke with at [place]: Larey Seat  Prior Living Arrangements/Services Living arrangements for the past 2 months: [place] with:: Self, Siblings   Do you feel safe going back to the place where you live?: Yes      Need for Family Participation in Patient Care: Yes (Comment) Care giver support system in place?: Yes (comment)   Criminal Activity/Legal Involvement Pertinent to Current Situation/Hospitalization: No - Comment as needed  Emotional Assessment  Psych Involvement: No (comment)  Admission diagnosis:  Hyperkalemia [E87.5] Generalized abdominal pain  [R10.84] Renal insufficiency [N28.9] Liver mass [R16.0] AKI (acute kidney injury) ([place]) [N17.9] Secondary carcinoma of lung, unspecified laterality ([place]) [C78.00] Constipation, unspecified constipation type [P49.82] Acute metabolic encephalopathy [M41.58] Patient Active Problem List   Diagnosis Date Noted  . AKI (acute kidney injury) ([place]) 09/23/2020  . Acute metabolic encephalopathy 30/94/0768  . Liver mass   . SIRS (systemic inflammatory response syndrome) (HCC)   . Focal seizures ([place]) 07/19/2020  . Brain metastasis ([place]) 07/04/2020  . Palliative care by specialist   . Lung cancer ([place]) 03/28/2020  . 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 Goals of care, counseling/discussion 04/05/2019  . 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 Glucose intolerance 03/11/2019  . Obesity, Class III, BMI 40-49.9 (morbid obesity) ([place]) 03/11/2019  . Tobacco use 03/11/2019  . Dental abscess 03/07/2019  . 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 Depression    PCP:  Glenda Chroman, MD Pharmacy:   [facility] [address] [place] [address] Phone: [phone] Fax: [place], [place] [place] [address] Phone: [phone] Fax: [phone]

## 2020-09-25 NOTE — Plan of Care (Signed)
  Problem: Acute Rehab PT Goals(only PT should resolve) Goal: Pt Will Go Supine/Side To Sit Outcome: Progressing [place] (Taken 09/25/2020 1628) Pt will go Supine/Side to Sit:  with modified independence  with supervision Goal: Patient Will Transfer Sit To/From Stand Outcome: Progressing [place] (Taken 09/25/2020 1628) Patient will transfer sit to/from stand:  with supervision  with min guard assist Goal: Pt Will Transfer Bed To Chair/Chair To Bed Outcome: Progressing [place]sheets (Taken 09/25/2020 1628) Pt will Transfer Bed to Chair/Chair to Bed:  with supervision  min guard assist Goal: Pt Will Ambulate Outcome: Progressing Flowsheets (Taken 09/25/2020 1628) Pt will Ambulate:  25 feet  with min guard assist  with minimal assist  with rolling walker  with cane   4:28 PM, 09/25/20 Lonell Grandchild, MPT Physical Therapist with [facility] [phone] office [phone] mobile phone

## 2020-09-25 NOTE — Consult Note (Addendum)
Consultation Note Date: 09/25/2020   Patient Name: Tara Aguilar  DOB: 1981/07/31  MRN: [id number]  Age / Sex: 39 y.o., female  PCP: Tara Chroman, MD Referring Physician: Barton Dubois, MD  Reason for Consultation: Establishing goals of care  HPI/Patient Profile: 39 y.o. 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  CT scan showed a new liver mass that was concerning for metastatic disease versus abscess.  It is felt that this is less likely to be infectious and more likely metastatic disease.  There was also evidence of cystitis.  Additionally she had acute kidney injury which has resolved with IV fluids.  Initially she was to be transferred to [facility] for MRI and radiation, however after further discussion she and family chose not to proceed with that plan and requested hospice.  Palliative medicine consulted for further goals of care and symptom management assistance.  Clinical Assessment and Goals of Care: Reviewed chart and discussed patient's case with Dr. Dyann Aguilar. I met with Tara Aguilar at the bedside.  She lives at home with her son and her sister.  She used to work as a Education administrator and has great insight into her medications. She was awake and alert and oriented to her situation. Shared with me she had discussed her status with Dr. Dyann Aguilar and felt there was no hope. We further explored her expectations for her health trajectory going forward.  We discussed her goals and her worries. She has a 50 year old son named Tara Aguilar, whose father died from cancer 2 years ago.  Her sister is helping  her care for Tara Aguilar and plans to take custody of him after her death.  She worries about him growing up without parents. Her options of going to receive MRI and discussing further treatment with oncology versus going home with hospice were discussed.  For now she wishes to spend as much time at home with her son, feeling as well as possible.  She would like to see physical therapy and have them print out some exercises to help her build strength when she is at home. A new MOST form was completed with selections for DNR, comfort measures, determine use or limitation of antibiotics when infection occurs, no IV fluids, no artificial feeding. We discussed the philosophy and services of hospice.  Symptom management-the only pain medication she has received is Norco 5-3 25 on Sunday evening.  She reports this did provide relief of her pain.  She has pain in her abdomen and her back.  She also has headaches frequently at night when she is trying to sleep.  This makes it difficult for her to sleep, Tylenol relieves them somewhat but not well enough.  The headaches occur around the front and to  the sides of her forehead.  We discussed these headaches could be related to her brain radiation and cancer, and can also possibly be related to stress and anxiety which she acknowledges.  She is agreeable to trying some low-dose lorazepam to help with this.  For constipation she has been started on senna 1 tab twice daily. Regarding her urinary retention-she would like to have the Foley removed and attempt to void on her own, however if she is unable she requests that the Foley is replaced and she can keep it to go home with. With her permission I called her sister and discussed all of the above.  Primary Decision Maker PATIENT    SUMMARY OF RECOMMENDATIONS -TOC referral for hospice services at home-she will need equipment -Hydrocodone/APAP 5-325 mg 1-2 tabs by mouth every 4 hours as needed for pain -Lorazepam 0.5 mg  by mouth every 4 hours as needed for anxiety, headache, or sleep -Continue senna 1 tab p.o. twice daily, will add MiraLAX daily as needed for constipation -Discontinue Foley catheter to allow voiding trial, if she is unable to void on her own recommend replacing catheter for comfort and keeping in place at discharge -Physical therapy evaluation for recommendations for exercises that she can do at home  Code Status/Advance Care Planning:  DNR  Psycho-social/Spiritual:   Desire for further Chaplaincy support:yes  Additional Recommendations: [facility] Referral  Prognosis:    < 6 months  Discharge Planning: Home with Hospice  Primary Diagnoses: Present on Admission: . AKI (acute kidney injury) ([place]) . Depression . Borderline diabetes . Mass of mediastinum . Brain metastasis ([place]) . Acute metabolic encephalopathy   I have reviewed the medical record, interviewed the patient and family, and examined the patient. The following aspects are pertinent.  Past Medical History:  Diagnosis Date  . Cancer ([place])   . Depression   . Glucose intolerance 03/11/2019  . Obesity, Class III, BMI 40-49.9 (morbid obesity) ([place]) 03/11/2019   Social History   Socioeconomic History  . Marital status: Widowed    Spouse name: Not on file  . Number of children: 1  . Years of education: Not on file  . Highest education level: Not on file  Occupational History  . Not on file  Tobacco Use  . Smoking status: Current Some Day Smoker    Packs/day: 1.00    Years: 25.00    Pack years: 25.00    Types: Cigarettes  . Smokeless tobacco: Never Used  Vaping Use  . Vaping Use: Never used  Substance and Sexual Activity  . Alcohol use: Not Currently  . Drug use: Not Currently  . Sexual activity: Not on file  Other Topics Concern  . Not on file  Social History Narrative   2019-03-20   Husband passed away four months ago from Cancer of the [place]mp[place].   Pt. Has 71 yo son at home.   Social Determinants of  Health   Financial Resource Strain: Low Risk   . Difficulty of Paying Living Expenses: Not hard at all  Food Insecurity: No Food Insecurity  . Worried About Charity fundraiser in the Last Year: Never true  . Ran Out of Food in the Last Year: Never true  Transportation Needs: No Transportation Needs  . Lack of Transportation (Medical): No  . Lack of Transportation (Non-Medical): No  Physical Activity: Inactive  . Days of Exercise per Week: 0 days  . Minutes of Exercise per Session: 0 min  Stress: No Stress Concern Present  .  Feeling of Stress : Only a little  Social Connections: Socially Isolated  . Frequency of Communication with Friends and Family: More than three times a week  . Frequency of Social Gatherings with Friends and Family: Three times a week  . Attends Religious Services: Never  . Active Member of Clubs or Organizations: No  . Attends Archivist Meetings: Never  . Marital Status: Widowed   Scheduled Meds: . Chlorhexidine Gluconate Cloth  6 each Topical Daily  . dexamethasone  4 mg Oral Daily  . enoxaparin (LOVENOX) injection  40 mg Subcutaneous Q12H  . gabapentin  300 mg Oral BID  . insulin aspart  0-15 Units Subcutaneous TID WC  . insulin aspart  0-5 Units Subcutaneous QHS  . levETIRAcetam  1,000 mg Oral BID  . senna-docusate  1 tablet Oral BID   Continuous Infusions: . ceFEPime (MAXIPIME) IV 2 g (09/25/20 0522)   PRN Meds:.acetaminophen **OR** acetaminophen, HYDROcodone-acetaminophen, LORazepam, ondansetron **OR** ondansetron (ZOFRAN) IV, sodium chloride Medications Prior to Admission:  Prior to Admission medications   Medication Sig Start Date End Date Taking? Authorizing Provider  albuterol (PROVENTIL) (2.5 MG/3ML) 0.083% nebulizer solution Take 3 mLs (2.5 mg total) by nebulization every 2 (two) hours as needed for wheezing. 03/29/20  Yes Emokpae, Courage, MD  albuterol (VENTOLIN HFA) 108 (90 Base) MCG/ACT inhaler Inhale 2 puffs into the lungs every  6 (six) hours as needed for wheezing or shortness of breath. 03/29/20  Yes Roxan Hockey, MD  blood glucose meter kit and supplies KIT Dispense based on patient and insurance preference. Use up to four times daily as directed. (FOR ICD-9 250.00, 250.01). 07/12/20  Yes Hall, Carole N, DO  dexamethasone (DECADRON) 4 MG tablet Take 1 tablet (4 mg total) by mouth daily. 08/24/20  Yes Vaslow, Acey Lav, MD  gabapentin (NEURONTIN) 300 MG capsule Take 300 mg by mouth 2 (two) times daily.  07/16/20  Yes [provider]  insulin aspart (NOVOLOG) 100 UNIT/ML FlexPen Inject 10 Units into the skin 3 (three) times daily with meals. 07/12/20  Yes Hall, Carole N, DO  insulin glargine (LANTUS) 100 UNIT/ML Solostar Pen Inject 50 Units into the skin daily. 07/12/20  Yes Hall, Carole N, DO  levETIRAcetam (KEPPRA) 1000 MG tablet Take 1 tablet (1,000 mg total) by mouth 2 (two) times daily. 08/24/20  Yes Vaslow, Acey Lav, MD  dexamethasone (DECADRON) 1 MG tablet Take by mouth. 08/27/20   [provider]   No Known Allergies Review of Systems  Constitutional: Positive for activity change. Negative for appetite change.  Gastrointestinal: Positive for abdominal pain.  Genitourinary: Positive for difficulty urinating.  Musculoskeletal: Positive for back pain.  Neurological: Positive for seizures, facial asymmetry and headaches.  Psychiatric/Behavioral: Positive for sleep disturbance.    Physical Exam Vitals and nursing note reviewed.  Constitutional:      General: She is not in acute distress. Cardiovascular:     Rate and Rhythm: Normal rate.  Pulmonary:     Effort: Pulmonary effort is normal.  Skin:    General: Skin is warm and dry.     Coloration: Skin is jaundiced.  Neurological:     Mental Status: She is alert and oriented to person, place, and time.     Comments: L sided facial droop  Psychiatric:        Mood and Affect: Mood normal.        Behavior: Behavior normal.        Thought  Content: Thought content normal.  Judgment: Judgment normal.     Vital Signs: BP 111/78 (BP Location: Left Arm)   Pulse 72   Temp 97.7 F (36.5 C)   Resp 18   Ht $R'5\' 5"'Ho$  (1.651 m)   Wt (!) 145.2 kg   SpO2 100%   BMI 53.25 kg/m  Pain Scale: 0-10 POSS *See Group Information*: S-Acceptable,Sleep, easy to arouse Pain Score: 5    SpO2: SpO2: 100 % O2 Device:SpO2: 100 % O2 Flow Rate: .O2 Flow Rate (L/min): 2 L/min  IO: Intake/output summary:   Intake/Output Summary (Last 24 hours) at 09/25/2020 1137 Last data filed at 09/25/2020 0522 Gross per 24 hour  Intake 5245.43 ml  Output 700 ml  Net 4545.43 ml    LBM: Last BM Date: 09/21/20 Baseline Weight: Weight: (!) 145.2 kg Most recent weight: Weight: (!) 145.2 kg     Palliative Assessment/Data: PPS: 40%     Thank you for this consult. Palliative medicine will continue to follow and assist as needed.   Time In: 1021 Time Out: 1239 Time Total: 139 minutes Greater than 50%  of this time was spent counseling and coordinating care related to the above assessment and plan.  Signed by: Mariana Kaufman, AGNP-C Palliative Medicine    Please contact Palliative Medicine Team phone at [phone] for questions and concerns.  For individual provider: See Shea Evans

## 2020-09-26 ENCOUNTER — Ambulatory Visit: Payer: BC Managed Care – PPO

## 2020-09-26 LAB — GLUCOSE, CAPILLARY
Glucose-Capillary: 123 mg/dL — ABNORMAL HIGH (ref 70–99)
Glucose-Capillary: 212 mg/dL — ABNORMAL HIGH (ref 70–99)

## 2020-09-26 MED ORDER — LORAZEPAM 0.5 MG PO TABS: 0.5000 mg | ORAL_TABLET | ORAL | 0 refills | Status: AC | PRN

## 2020-09-26 MED ORDER — SENNOSIDES 8.6 MG PO TABS: 2.0000 | ORAL_TABLET | Freq: Two times a day (BID) | ORAL | 0 refills | Status: AC

## 2020-09-26 MED ORDER — GLUCOSE BLOOD VI STRP: ORAL_STRIP | 12 refills | Status: AC

## 2020-09-26 MED ORDER — HYDROCODONE-ACETAMINOPHEN 5-325 MG PO TABS
1.0000 | ORAL_TABLET | ORAL | 0 refills | Status: AC | PRN
Start: 2020-09-26 — End: 2021-09-26

## 2020-09-26 MED ORDER — POLYETHYLENE GLYCOL 3350 17 G PO PACK: 17.0000 g | PACK | Freq: Every day | ORAL | 0 refills | Status: AC | PRN

## 2020-09-26 NOTE — TOC Transition Note (Addendum)
Transition of Care [facility]) - CM/SW Discharge Note   Patient Details  Name: Tara Aguilar MRN: [id number] Date of Birth: 1981-02-12  Transition of Care [facility]) CM/SW Contact:  Salome Arnt, [place] Phone Number: 09/26/2020, 12:04 PM   Clinical Narrative:  Pt will d/c home with hospice today. Hospice reports equipment has been delivered and request transport be arranged for after 2 as this is when pt's sister will be home. LCSW confirmed address with pt and she states she will notify her sister that [facility] will transport after 2. Cassandra at [facility] notified of above. D/C summary sent to Hospice. [facility] arranged and RN updated.      Final next level of care: Home w Hospice Care Barriers to Discharge: Barriers Resolved   Patient Goals and CMS Choice Patient states their goals for this hospitalization and ongoing recovery are:: to go home with hospice. CMS Medicare.gov Compare Post Acute Care list provided to:: Patient    Discharge Placement                Patient to be transferred to facility by: [facility] Name of family member notified: pt only. She plans to call sister. Patient and family notified of of transfer: 09/26/20  Discharge Plan and Services In-house Referral: Hospice / [place]              DME Arranged: Hospice Equipment Package Others           [facility] Agency: [place] Date [place]: 09/25/20 Time [place]: 1250 Representative spoke with at [place]: [place] ([place]) Interventions     Readmission Risk Interventions No flowsheet data found.

## 2020-09-26 NOTE — Progress Notes (Signed)
Notified MD that patient had not voided since foley removal earlier in the day.Patient had a bladder scan volume of 416cc. Patient sat on bedside commode to see if she could void on her own. Patient was unsuccessful. MD ordered an in and out cath X 1.. In and out cath was done at 0050. Will continue to assess patients bladder function as appropriate.

## 2020-09-26 NOTE — Discharge Summary (Signed)
Physician Discharge Summary  Tara Aguilar EXB:[id number] DOB: 24-Mar-1981 DOA: 09/23/2020  PCP: Glenda Chroman, MD  Admit date: 09/23/2020 Discharge date: 09/26/2020  Admitted From: Home Disposition:  Home with home hospice   Discharge Condition: Terminal  CODE STATUS: DNR  Diet recommendation: Regular   Brief/Interim Summary: Tara Aguilar a 39 y.o.femalewith medical history significant forlung cancer, brain metastasis obesity, depression. Patient presented to the ED with complaints of constipation and inability to see urine over the past 2 to 3 days. She reports poor oral intake in the past 3 days also. She denies pain with urination. No abdominal pain, no chest pain. She reports nausea without vomiting. No loose stools.  ED Course:Temperature 97.9. Heart rate initially 121 improved to 102. Blood pressure systolic 102V to 253G. O2 sats greater than 92% on room air. WBC 10.8. UA 6-10 WBCs greater than 500 glucose. Lactic acid 2.6. Potassium 5.6. Creatinine 1.26. Chest x-ray linear right base opacities favoring atelectasis. Abdominal CT-new mass with indistinct margins hepatic segment 4B,MRI abdomen recommended. 1 L bolus given, IV ceftriaxone for possible UTI given. Lokelma 10 g given. Hospitalist to admit for acute kidney injury and possible UTI.  Interim: Patient's inpatient work-up has revealed metastatic lesions to her brain which accounts for her encephalopathy.  Patient met with palliative care medicine, decided to pursue comfort care and discharged home with home hospice.  Discharge Diagnoses:  Principal Problem:   AKI (acute kidney injury) ([place]) Active Problems:   Depression   Borderline diabetes   Mass of mediastinum   Brain metastasis ([place])   Focal seizures ([place])   Acute metabolic encephalopathy   1-AKI (acute kidney injury) ([employer]): In the setting of dehydration and prerenal azotemia -Resolved with IV hydration  2-Acute metabolic  encephalopathy -In the setting of dehydration and underlying metastatic lesions to her brain. -Also with concern for UTI. -Mentation is now improved and back to baseline.  3-Metastatic squamous cell lung cancer -With new metastatic lesions appreciated in her liver -Appreciate goals of care discussion and advance care planning by palliative care -Decision made to pursue comfort and symptomatic management only -Patient to be discharged home with hospice  4-Focal seizures -Continue Keppra  5-Borderline diabetes -Most likely associated with the use of her steroids treatment  6-Constipation -Continue Senokot and add MiraLAX for constipation. -If needed will add low-dose Linzess on daily basis.  7-Presumed UTI -Has completed 3 days of cefepime.   Discharge Instructions   Allergies as of 09/26/2020   No Known Allergies     Medication List    TAKE these medications   albuterol (2.5 MG/3ML) 0.083% nebulizer solution Commonly known as: PROVENTIL Take 3 mLs (2.5 mg total) by nebulization every 2 (two) hours as needed for wheezing.   albuterol 108 (90 Base) MCG/ACT inhaler Commonly known as: VENTOLIN HFA Inhale 2 puffs into the lungs every 6 (six) hours as needed for wheezing or shortness of breath.   blood glucose meter kit and supplies Kit Dispense based on patient and insurance preference. Use up to four times daily as directed. (FOR ICD-9 250.00, 250.01).   dexamethasone 4 MG tablet Commonly known as: DECADRON Take 1 tablet (4 mg total) by mouth daily. What changed: Another medication with the same name was removed. Continue taking this medication, and follow the directions you see here.   gabapentin 300 MG capsule Commonly known as: NEURONTIN Take 300 mg by mouth 2 (two) times daily.   HYDROcodone-acetaminophen 5-325 MG tablet Commonly known as: NORCO/VICODIN Take 1-2  tablets by mouth every 4 (four) hours as needed for moderate pain.   insulin aspart 100  UNIT/ML FlexPen Commonly known as: NOVOLOG Inject 10 Units into the skin 3 (three) times daily with meals.   insulin glargine 100 UNIT/ML Solostar Pen Commonly known as: LANTUS Inject 50 Units into the skin daily.   levETIRAcetam 1000 MG tablet Commonly known as: KEPPRA Take 1 tablet (1,000 mg total) by mouth 2 (two) times daily.   LORazepam 0.5 MG tablet Commonly known as: ATIVAN Take 1 tablet (0.5 mg total) by mouth every 4 (four) hours as needed for anxiety. May crush, mix with water and give sublingually if needed.   polyethylene glycol 17 g packet Commonly known as: MIRALAX / GLYCOLAX Take 17 g by mouth daily as needed.   senna 8.6 MG tablet Commonly known as: Senokot Take 2 tablets (17.2 mg total) by mouth 2 (two) times daily. May crush, mix with water and give sublingually if needed.       Contact information for after-discharge care    [facility].   Service: Inpatient Hospice Why:  Home with Hospice - equipment will be delivered 12/1 Contact information: [address] [phone]                 No Known Allergies  Consultations:  Palliative care medicine   Procedures/Studies: DG Chest 2 View  Result Date: 09/23/2020 CLINICAL DATA:  Suspected sepsis. EXAM: CHEST - 2 VIEW COMPARISON:  CT chest 06/12/2020.  Chest radiograph 03/28/2020. FINDINGS: Evaluation is limited by low lung volumes, technique, and patient body habitus. Similar chronic elevated right hemidiaphragm with linear right basilar opacities. No confluent consolidation. No visible pleural effusions or pneumothorax. No acute osseous abnormality. Similar cardiomediastinal silhouette. Please refer to prior CT chest for description of right paratracheal/supraclavicular soft tissue. IMPRESSION: Evaluation is limited by low lung volumes, technique, and patient body habitus. 1. Elevated right hemidiaphragm with linear  right basilar opacities, favor atelectasis. 2. Please refer to prior CT chest for description of right paratracheal/supraclavicular soft tissue. Additionally, please note that CT could provide more sensitive evaluation for metastatic disease in this patient with previously radiographically occult micronodularity. Electronically Signed   By: Margaretha Sheffield MD   On: 09/23/2020 12:58   DG Abdomen 1 View  Result Date: 09/23/2020 CLINICAL DATA:  Constipation. EXAM: ABDOMEN - 1 VIEW COMPARISON:  CT abdomen pelvis dated 05/29/2020. FINDINGS: The bowel gas pattern is normal. A moderate amount of stool is seen in the rectum. No radio-opaque calculi. Mild degenerative changes are seen in both hips. IMPRESSION: Nonobstructive bowel gas pattern. Electronically Signed   By: Zerita Boers M.D.   On: 09/23/2020 13:54   CT HEAD WO CONTRAST  Result Date: 09/23/2020 CLINICAL DATA:  History of metastatic lung cancer with brain metastases. Mental status change with drowsiness/lethargy. EXAM: CT HEAD WITHOUT CONTRAST TECHNIQUE: Contiguous axial images were obtained from the base of the skull through the vertex without intravenous contrast. COMPARISON:  Head CT 08/22/2020 and MRI 08/18/2020 FINDINGS: Brain: Known brain metastases are again seen in the lateral left parietal lobe with detailed assessment limited in the absence of IV contrast. There is mild to moderate regional vasogenic edema which has greatly decreased from the prior CT. Regional mass effect has resolved, and there is no residual midline shift. No new brain edema is evident. No acute cortically based infarct, intracranial hemorrhage, or extra-axial fluid collection is identified. The  ventricles are normal in size. Vascular: No hyperdense vessel. Skull: No fracture or suspicious osseous lesion. Sinuses/Orbits: Mild mucosal thickening in the paranasal sinuses. Unchanged small volume fluid in the left sphenoid sinus. No significant mastoid fluid. Unremarkable  orbits. Other: None. IMPRESSION: 1. Decreased left cerebral vasogenic edema with resolved mass effect and midline shift. 2. No evidence of new intracranial abnormality. Electronically Signed   By: Logan Bores M.D.   On: 09/23/2020 19:05   CT ABDOMEN PELVIS W CONTRAST  Result Date: 09/23/2020 CLINICAL DATA:  Abdominal distension. 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 EXAM: CT ABDOMEN AND PELVIS WITH CONTRAST TECHNIQUE: Multidetector CT imaging of the abdomen and pelvis was performed using the standard protocol following bolus administration of intravenous contrast. CONTRAST:  17mL OMNIPAQUE IOHEXOL 300 MG/ML  SOLN COMPARISON:  CT abdomen pelvis dated 05/29/2020. FINDINGS: Lower chest: Atelectasis of the right middle lobe is partially imaged. Hepatobiliary: A 4.2 cm mass measuring fluid attenuation is seen in hepatic segment 4B with surrounding segmental hyperenhancement. Hyperenhancement in this area is unchanged since 05/29/2020 and likely represents collateral blood flow due to superior vena cava obstruction. The mass is new from prior exam and the margins of the mass are slightly indistinct. No gallstones, gallbladder wall thickening, or biliary dilatation. Pancreas: Unremarkable. No pancreatic ductal dilatation or surrounding inflammatory changes. Spleen: Normal in size without focal abnormality. Adrenals/Urinary Tract: Adrenal glands are unremarkable. Kidneys are normal, without renal calculi, focal lesion, or hydronephrosis. The urinary bladder is deflated with a Foley catheter in place, however the urinary bladder wall appears mildly thickened with questionable endothelial hyperenhancement. Stomach/Bowel: Stomach is within normal limits. Appendix appears normal. No evidence of bowel wall thickening, distention, or inflammatory changes. Vascular/Lymphatic: No significant vascular findings are present. No enlarged abdominal or pelvic  lymph nodes. Reproductive: Uterus and bilateral adnexa are unremarkable. Other: No abdominal wall hernia or abnormality. No abdominopelvic ascites. Musculoskeletal: No acute or significant osseous findings. IMPRESSION: 1. New mass with indistinct margins measuring fluid attenuation in hepatic segment 4B. Differential considerations include metastatic disease and hepatic abscess. MR abdomen according to a liver protocol could be obtained to further evaluate this finding. 2. The urinary bladder wall appears mildly thickened with questionable endothelial hyperenhancement, however the urinary bladder is incompletely distended. These findings are nonspecific but may reflect cystitis. Electronically Signed   By: Zerita Boers M.D.   On: 09/23/2020 15:28       Discharge Exam: Vitals:   09/25/20 2138 09/26/20 0427  BP: 132/83 130/84  Pulse: 91 82  Resp: 18 18  Temp: 98 F (36.7 C) 97.7 F (36.5 C)  SpO2: 97% 98%    General: Pt is alert, awake, not in acute distress Cardiovascular: RRR, S1/S2 + Respiratory: CTA bilaterally, no wheezing, no rhonchi, no respiratory distress, no conversational dyspnea  Abdominal: Soft, NT Extremities: no edema Psych: Normal mood and affect, stable judgement and insight     The results of significant diagnostics from this hospitalization (including imaging, microbiology, ancillary and laboratory) are listed below for reference.     Microbiology: Recent Results (from the past 240 hour(s))  Culture, blood (Routine x 2)     Status: None (Preliminary result)   Collection Time: 09/23/20  1:04 PM   Specimen: Right Antecubital; Blood  Result Value Ref Range Status   Specimen Description   Final    RIGHT ANTECUBITAL BOTTLES DRAWN AEROBIC AND ANAEROBIC   Special Requests Blood Culture  adequate volume  Final   Culture   Final    NO GROWTH 2 DAYS Performed at [facility], [address] [place], [place] [address]    Report Status PENDING  Incomplete  Culture,  blood (Routine x 2)     Status: None (Preliminary result)   Collection Time: 09/23/20  1:27 PM   Specimen: Left Antecubital; Blood  Result Value Ref Range Status   Specimen Description   Final    LEFT ANTECUBITAL BOTTLES DRAWN AEROBIC AND ANAEROBIC   Special Requests Blood Culture adequate volume  Final   Culture   Final    NO GROWTH 2 DAYS Performed at [facility], [address] [place], [place] [address]    Report Status PENDING  Incomplete  Resp Panel by RT-PCR (Flu A&B, Covid) Nasopharyngeal Swab     Status: None   Collection Time: 09/23/20  4:01 PM   Specimen: Nasopharyngeal Swab; Nasopharyngeal(NP) swabs in vial transport medium  Result Value Ref Range Status   SARS Coronavirus 2 by RT PCR NEGATIVE NEGATIVE Final    Comment: (NOTE) SARS-CoV-2 target nucleic acids are NOT DETECTED.  The SARS-CoV-2 RNA is generally detectable in upper respiratory specimens during the acute phase of infection. The lowest concentration of SARS-CoV-2 viral copies this assay can detect is 138 copies/mL. A negative result does not preclude SARS-Cov-2 infection and should not be used as the sole basis for treatment or other patient management decisions. A negative result may occur with  improper specimen collection/handling, submission of specimen other than nasopharyngeal swab, presence of viral mutation(s) within the areas targeted by this assay, and inadequate number of viral copies(<138 copies/mL). A negative result must be combined with clinical observations, patient history, and epidemiological information. The expected result is Negative.  Fact Sheet for Patients:  EntrepreneurPulse.com.au  Fact Sheet for Healthcare Providers:  IncredibleEmployment.be  This test is no t yet approved or cleared by the [place] FDA and  has been authorized for detection and/or diagnosis of SARS-CoV-2 by FDA under an Emergency Use Authorization (EUA). This EUA will  remain  in effect (meaning this test can be used) for the duration of the COVID-19 declaration under Section 564(b)(1) of the Act, 21 U.S.C.section 360bbb-3(b)(1), unless the authorization is terminated  or revoked sooner.       Influenza A by PCR NEGATIVE NEGATIVE Final   Influenza B by PCR NEGATIVE NEGATIVE Final    Comment: (NOTE) The Xpert Xpress SARS-CoV-2/FLU/RSV plus assay is intended as an aid in the diagnosis of influenza from Nasopharyngeal swab specimens and should not be used as a sole basis for treatment. Nasal washings and aspirates are unacceptable for Xpert Xpress SARS-CoV-2/FLU/RSV testing.  Fact Sheet for Patients: EntrepreneurPulse.com.au  Fact Sheet for Healthcare Providers: IncredibleEmployment.be  This test is not yet approved or cleared by the [place] FDA and has been authorized for detection and/or diagnosis of SARS-CoV-2 by FDA under an Emergency Use Authorization (EUA). This EUA will remain in effect (meaning this test can be used) for the duration of the COVID-19 declaration under Section 564(b)(1) of the Act, 21 U.S.C. section 360bbb-3(b)(1), unless the authorization is terminated or revoked.  Performed at [facility], [address], [place], [place] [address]   Urine Culture     Status: None   Collection Time: 09/23/20  4:03 PM   Specimen: Urine, Catheterized  Result Value Ref Range Status   Specimen Description   Final    URINE, CATHETERIZED Performed at [facility], [address], [place], [place] [address]    Special Requests   Final    NONE Performed at [facility], [address] [place], [place] [address]    Culture   Final    NO GROWTH Performed at [facility], [place] [address] [place], [place] [address]    Report Status 09/25/2020 FINAL  Final     Labs: BNP (last 3 results) No results for input(s): BNP in the last 8760 hours. Basic Metabolic Panel: Recent Labs  Lab  09/19/20 0908 09/23/20 1303 09/24/20 0442  NA 139 134* 137  K 4.4 5.6* 4.5  CL 98 98 105  CO2 $Re'30 26 26  'qlf$ GLUCOSE 134* 162* 149*  BUN 15 29* 21*  CREATININE 0.80 1.26* 0.80  CALCIUM 9.3 9.7 8.8*   Liver Function Tests: Recent Labs  Lab 09/19/20 0908 09/23/20 1303  AST 47* 44*  ALT 178* 114*  ALKPHOS 94 93  BILITOT 0.5 1.7*  PROT 7.0 7.8  ALBUMIN 4.0 4.2   No results for input(s): LIPASE, AMYLASE in the last 168 hours. No results for input(s): AMMONIA in the last 168 hours. CBC: Recent Labs  Lab 09/19/20 0908 09/23/20 1303 09/24/20 0442  WBC 6.9 10.8* 8.5  NEUTROABS 5.4 9.2*  --   HGB 16.7* 17.9* 15.2*  HCT 51.8* 55.1* 47.8*  MCV 102.0* 100.5* 102.4*  PLT 205 185 162   Cardiac Enzymes: No results for input(s): CKTOTAL, CKMB, CKMBINDEX, TROPONINI in the last 168 hours. BNP: Invalid input(s): POCBNP CBG: Recent Labs  Lab 09/25/20 0722 09/25/20 1118 09/25/20 1617 09/25/20 2134 09/26/20 0723  GLUCAP 171* 192* 267* 235* 123*   D-Dimer No results for input(s): DDIMER in the last 72 hours. Hgb A1c No results for input(s): HGBA1C in the last 72 hours. Lipid Profile No results for input(s): CHOL, HDL, LDLCALC, TRIG, CHOLHDL, LDLDIRECT in the last 72 hours. Thyroid function studies No results for input(s): TSH, T4TOTAL, T3FREE, THYROIDAB in the last 72 hours.  Invalid input(s): FREET3 Anemia work up No results for input(s): VITAMINB12, FOLATE, FERRITIN, TIBC, IRON, RETICCTPCT in the last 72 hours. Urinalysis    Component Value Date/Time   COLORURINE YELLOW 09/23/2020 [place] 09/23/2020 1408   LABSPEC 1.016 09/23/2020 1408   PHURINE 6.0 09/23/2020 1408   GLUCOSEU >=500 (A) 09/23/2020 1408   HGBUR NEGATIVE 09/23/2020 1408   BILIRUBINUR NEGATIVE 09/23/2020 1408   KETONESUR NEGATIVE 09/23/2020 1408   PROTEINUR NEGATIVE 09/23/2020 1408   NITRITE NEGATIVE 09/23/2020 1408   LEUKOCYTESUR NEGATIVE 09/23/2020 1408   Sepsis Labs Invalid  input(s): PROCALCITONIN,  WBC,  LACTICIDVEN Microbiology Recent Results (from the past 240 hour(s))  Culture, blood (Routine x 2)     Status: None (Preliminary result)   Collection Time: 09/23/20  1:04 PM   Specimen: Right Antecubital; Blood  Result Value Ref Range Status   Specimen Description   Final    RIGHT ANTECUBITAL BOTTLES DRAWN AEROBIC AND ANAEROBIC   Special Requests Blood Culture adequate volume  Final   Culture   Final    NO GROWTH 2 DAYS Performed at [facility], [address], [place], [place] [address]    Report Status PENDING  Incomplete  Culture, blood (Routine x 2)     Status: None (Preliminary result)   Collection Time: 09/23/20  1:27 PM   Specimen: Left Antecubital; Blood  Result Value Ref Range Status   Specimen Description   Final    LEFT ANTECUBITAL BOTTLES DRAWN AEROBIC AND ANAEROBIC  Special Requests Blood Culture adequate volume  Final   Culture   Final    NO GROWTH 2 DAYS Performed at [facility], [address] [place], [place] [address]    Report Status PENDING  Incomplete  Resp Panel by RT-PCR (Flu A&B, Covid) Nasopharyngeal Swab     Status: None   Collection Time: 09/23/20  4:01 PM   Specimen: Nasopharyngeal Swab; Nasopharyngeal(NP) swabs in vial transport medium  Result Value Ref Range Status   SARS Coronavirus 2 by RT PCR NEGATIVE NEGATIVE Final    Comment: (NOTE) SARS-CoV-2 target nucleic acids are NOT DETECTED.  The SARS-CoV-2 RNA is generally detectable in upper respiratory specimens during the acute phase of infection. The lowest concentration of SARS-CoV-2 viral copies this assay can detect is 138 copies/mL. A negative result does not preclude SARS-Cov-2 infection and should not be used as the sole basis for treatment or other patient management decisions. A negative result may occur with  improper specimen collection/handling, submission of specimen other than nasopharyngeal swab, presence of viral mutation(s) within the areas  targeted by this assay, and inadequate number of viral copies(<138 copies/mL). A negative result must be combined with clinical observations, patient history, and epidemiological information. The expected result is Negative.  Fact Sheet for Patients:  EntrepreneurPulse.com.au  Fact Sheet for Healthcare Providers:  IncredibleEmployment.be  This test is no t yet approved or cleared by the [place] FDA and  has been authorized for detection and/or diagnosis of SARS-CoV-2 by FDA under an Emergency Use Authorization (EUA). This EUA will remain  in effect (meaning this test can be used) for the duration of the COVID-19 declaration under Section 564(b)(1) of the Act, 21 U.S.C.section 360bbb-3(b)(1), unless the authorization is terminated  or revoked sooner.       Influenza A by PCR NEGATIVE NEGATIVE Final   Influenza B by PCR NEGATIVE NEGATIVE Final    Comment: (NOTE) The Xpert Xpress SARS-CoV-2/FLU/RSV plus assay is intended as an aid in the diagnosis of influenza from Nasopharyngeal swab specimens and should not be used as a sole basis for treatment. Nasal washings and aspirates are unacceptable for Xpert Xpress SARS-CoV-2/FLU/RSV testing.  Fact Sheet for Patients: EntrepreneurPulse.com.au  Fact Sheet for Healthcare Providers: IncredibleEmployment.be  This test is not yet approved or cleared by the [place] FDA and has been authorized for detection and/or diagnosis of SARS-CoV-2 by FDA under an Emergency Use Authorization (EUA). This EUA will remain in effect (meaning this test can be used) for the duration of the COVID-19 declaration under Section 564(b)(1) of the Act, 21 U.S.C. section 360bbb-3(b)(1), unless the authorization is terminated or revoked.  Performed at [facility], [address] [place], [place] [address]   Urine Culture     Status: None   Collection Time: 09/23/20  4:03 PM    Specimen: Urine, Catheterized  Result Value Ref Range Status   Specimen Description   Final    URINE, CATHETERIZED Performed at [facility], [address] [place], [place] [address]    Special Requests   Final    NONE Performed at [facility], [address] [place], [place] [address]    Culture   Final    NO GROWTH Performed at [facility], [place] [address], [place], [place] [address]    Report Status 09/25/2020 FINAL  Final     Patient was seen and examined on the day of discharge and was found to be in stable condition. Time coordinating discharge: 40 minutes  including assessment and coordination of care, as well as examination of the patient.   SIGNED:  Dessa Phi, DO [facility] 09/26/2020, 9:03 AM

## 2020-09-27 ENCOUNTER — Ambulatory Visit: Payer: BC Managed Care – PPO

## 2020-09-28 LAB — CULTURE, BLOOD (ROUTINE X 2)
Culture: NO GROWTH
Culture: NO GROWTH
Special Requests: ADEQUATE
Special Requests: ADEQUATE

## 2020-10-09 ENCOUNTER — Encounter (HOSPITAL_COMMUNITY): Payer: Self-pay | Admitting: Oncology

## 2020-10-10 ENCOUNTER — Inpatient Hospital Stay (HOSPITAL_COMMUNITY): Payer: BC Managed Care – PPO | Attending: Oncology | Admitting: Oncology

## 2020-10-10 ENCOUNTER — Other Ambulatory Visit: Payer: Self-pay

## 2020-10-10 ENCOUNTER — Inpatient Hospital Stay (HOSPITAL_COMMUNITY): Payer: BC Managed Care – PPO

## 2020-10-10 ENCOUNTER — Telehealth: Payer: Self-pay

## 2020-10-10 ENCOUNTER — Other Ambulatory Visit (HOSPITAL_COMMUNITY): Payer: Self-pay

## 2020-10-10 VITALS — BP 136/86 | HR 98 | Temp 97.0°F | Resp 20 | Wt 299.2 lb

## 2020-10-10 VITALS — BP 135/86 | HR 96 | Temp 96.7°F | Resp 20

## 2020-10-10 DIAGNOSIS — Z923 Personal history of irradiation: Secondary | ICD-10-CM | POA: Diagnosis not present

## 2020-10-10 DIAGNOSIS — C3490 Malignant neoplasm of unspecified part of unspecified bronchus or lung: Secondary | ICD-10-CM

## 2020-10-10 DIAGNOSIS — K7689 Other specified diseases of liver: Secondary | ICD-10-CM | POA: Diagnosis not present

## 2020-10-10 DIAGNOSIS — Z5112 Encounter for antineoplastic immunotherapy: Secondary | ICD-10-CM | POA: Insufficient documentation

## 2020-10-10 DIAGNOSIS — C7931 Secondary malignant neoplasm of brain: Secondary | ICD-10-CM | POA: Insufficient documentation

## 2020-10-10 DIAGNOSIS — Z7901 Long term (current) use of anticoagulants: Secondary | ICD-10-CM | POA: Diagnosis not present

## 2020-10-10 DIAGNOSIS — R519 Headache, unspecified: Secondary | ICD-10-CM | POA: Diagnosis not present

## 2020-10-10 DIAGNOSIS — F17210 Nicotine dependence, cigarettes, uncomplicated: Secondary | ICD-10-CM | POA: Diagnosis not present

## 2020-10-10 DIAGNOSIS — Z7952 Long term (current) use of systemic steroids: Secondary | ICD-10-CM | POA: Diagnosis not present

## 2020-10-10 DIAGNOSIS — I82612 Acute embolism and thrombosis of superficial veins of left upper extremity: Secondary | ICD-10-CM | POA: Insufficient documentation

## 2020-10-10 DIAGNOSIS — Z79899 Other long term (current) drug therapy: Secondary | ICD-10-CM | POA: Insufficient documentation

## 2020-10-10 DIAGNOSIS — R946 Abnormal results of thyroid function studies: Secondary | ICD-10-CM | POA: Insufficient documentation

## 2020-10-10 DIAGNOSIS — Z801 Family history of malignant neoplasm of trachea, bronchus and lung: Secondary | ICD-10-CM | POA: Diagnosis not present

## 2020-10-10 LAB — CBC WITH DIFFERENTIAL/PLATELET
Abs Immature Granulocytes: 0.29 10*3/uL — ABNORMAL HIGH (ref 0.00–0.07)
Basophils Absolute: 0.1 10*3/uL (ref 0.0–0.1)
Basophils Relative: 1 %
Eosinophils Absolute: 0.1 10*3/uL (ref 0.0–0.5)
Eosinophils Relative: 1 %
HCT: 51.7 % — ABNORMAL HIGH (ref 36.0–46.0)
Hemoglobin: 16.9 g/dL — ABNORMAL HIGH (ref 12.0–15.0)
Immature Granulocytes: 3 %
Lymphocytes Relative: 10 %
Lymphs Abs: 1.1 10*3/uL (ref 0.7–4.0)
MCH: 32.2 pg (ref 26.0–34.0)
MCHC: 32.7 g/dL (ref 30.0–36.0)
MCV: 98.5 fL (ref 80.0–100.0)
Monocytes Absolute: 0.8 10*3/uL (ref 0.1–1.0)
Monocytes Relative: 7 %
Neutro Abs: 8.9 10*3/uL — ABNORMAL HIGH (ref 1.7–7.7)
Neutrophils Relative %: 78 %
Platelets: 232 10*3/uL (ref 150–400)
RBC: 5.25 MIL/uL — ABNORMAL HIGH (ref 3.87–5.11)
RDW: 13.7 % (ref 11.5–15.5)
WBC: 11.3 10*3/uL — ABNORMAL HIGH (ref 4.0–10.5)
nRBC: 0 % (ref 0.0–0.2)

## 2020-10-10 LAB — COMPREHENSIVE METABOLIC PANEL
ALT: 156 U/L — ABNORMAL HIGH (ref 0–44)
AST: 55 U/L — ABNORMAL HIGH (ref 15–41)
Albumin: 3.8 g/dL (ref 3.5–5.0)
Alkaline Phosphatase: 82 U/L (ref 38–126)
Anion gap: 8 (ref 5–15)
BUN: 33 mg/dL — ABNORMAL HIGH (ref 6–20)
CO2: 32 mmol/L (ref 22–32)
Calcium: 9.5 mg/dL (ref 8.9–10.3)
Chloride: 97 mmol/L — ABNORMAL LOW (ref 98–111)
Creatinine, Ser: 0.88 mg/dL (ref 0.44–1.00)
GFR, Estimated: >60 mL/min (ref >60)
Glucose, Bld: 129 mg/dL — ABNORMAL HIGH (ref 70–99)
Potassium: 4.8 mmol/L (ref 3.5–5.1)
Sodium: 137 mmol/L (ref 135–145)
Total Bilirubin: 0.8 mg/dL (ref 0.3–1.2)
Total Protein: 7.2 g/dL (ref 6.5–8.1)

## 2020-10-10 LAB — TSH: TSH: 15.594 u[IU]/mL — ABNORMAL HIGH (ref 0.350–4.500)

## 2020-10-10 MED ORDER — SODIUM CHLORIDE 0.9 % IV SOLN
200.0000 mg | Freq: Once | INTRAVENOUS | Status: AC
  Administered 2020-10-10: 200 mg via INTRAVENOUS
  Filled 2020-10-10: qty 8

## 2020-10-10 MED ORDER — SODIUM CHLORIDE 0.9 % IV SOLN: Freq: Once | INTRAVENOUS | Status: AC

## 2020-10-10 MED ORDER — SODIUM CHLORIDE 0.9 % IV SOLN
10.0000 mg/kg | Freq: Once | INTRAVENOUS | Status: AC
  Administered 2020-10-10: 1300 mg via INTRAVENOUS
  Filled 2020-10-10: qty 48

## 2020-10-10 NOTE — Progress Notes (Signed)
TSH 15.594. Patient to receive Beryle Flock and Noah Charon today per treatment plan. Last urine on 09/23/20 negative for protein.

## 2020-10-10 NOTE — Telephone Encounter (Signed)
Patient would not like to receive Covid vaccine at this time.

## 2020-10-10 NOTE — Progress Notes (Signed)
Patient presents today for Keytruda infusion.  Labs within parameters for treatment.  Message received from C.Edwards/ Dr. Benay Spice to proceed with treatment.  Treatment given today per MD orders.  Tolerated infusion without adverse affects.  Vital signs stable.  No complaints at this time.  Discharge from clinic ambulatory in stable condition.  Alert and oriented X 3.  Follow up with [facility] as scheduled.

## 2020-10-10 NOTE — Progress Notes (Signed)
Patient's thyroid level is still mildly elevated Dr. Benay Spice states that we will check t4 and repeat next treatment and will add synthroid if T4 low.

## 2020-10-10 NOTE — Patient Instructions (Signed)
[facility] Discharge Instructions for Patients Receiving Chemotherapy  Today you received the following chemotherapy agents   To help prevent nausea and vomiting after your treatment, we encourage you to take your nausea medication   If you develop nausea and vomiting that is not controlled by your nausea medication, call the clinic.   BELOW ARE SYMPTOMS THAT SHOULD BE REPORTED IMMEDIATELY:  *FEVER GREATER THAN 100.5 F  *CHILLS WITH OR WITHOUT FEVER  NAUSEA AND VOMITING THAT IS NOT CONTROLLED WITH YOUR NAUSEA MEDICATION  *UNUSUAL SHORTNESS OF BREATH  *UNUSUAL BRUISING OR BLEEDING  TENDERNESS IN MOUTH AND THROAT WITH OR WITHOUT PRESENCE OF ULCERS  *URINARY PROBLEMS  *BOWEL PROBLEMS  UNUSUAL RASH Items with * indicate a potential emergency and should be followed up as soon as possible.  Feel free to call the clinic should you have any questions or concerns. The clinic phone number is [phone].  Please show the CHEMO ALERT CARD at check-in to the Emergency Department and triage nurse.   

## 2020-10-10 NOTE — Progress Notes (Signed)
[facility] OFFICE PROGRESS NOTE   Diagnosis: Non-small cell lung cancer  INTERVAL HISTORY:   Tara Aguilar returns for a scheduled visit.  She has a history of metastatic non-small cell lung cancer, currently being treated with bevacizumab and pembrolizumab.  She completed a partial course of brain radiation and was admitted on 09/23/2020 with difficulty urinating and constipation.  She was diagnosed with acute metabolic encephalopathy.  During this hospital admission she decided to pursue home hospice care.  She has since decided to discontinue hospice and continue systemic therapy.  She reports a headache.  She has decreased Decadron to 2 mg daily.  No bleeding or symptom of thrombosis.  A Foley catheter remains in place.  A CT of the abdomen on 09/23/2020 revealed an indeterminate 4.2 cm lesion in hepatic segment 4B.  A CT brain revealed known brain metastases with decreased vasogenic edema and resolution of mass-effect.  Objective:  Vital signs in last 24 hours:  Blood pressure 136/86, pulse 98, temperature (!) 97 F (36.1 C), temperature source Temporal, resp. rate 20, weight 299 lb 3.2 oz (135.7 kg), SpO2 94 %.    HEENT: No thrush Resp: Lungs clear bilaterally Cardio: Regular rate and rhythm GI: No hepatomegaly Vascular: No leg edema Neuro: Alert, follows commands      Lab Results:  Lab Results  Component Value Date   WBC 11.3 (H) 10/10/2020   HGB 16.9 (H) 10/10/2020   HCT 51.7 (H) 10/10/2020   MCV 98.5 10/10/2020   PLT 232 10/10/2020   NEUTROABS 8.9 (H) 10/10/2020    CMP  Lab Results  Component Value Date   NA 137 10/10/2020   K 4.8 10/10/2020   CL 97 (L) 10/10/2020   CO2 32 10/10/2020   GLUCOSE 129 (H) 10/10/2020   BUN 33 (H) 10/10/2020   CREATININE 0.88 10/10/2020   CALCIUM 9.5 10/10/2020   PROT 7.2 10/10/2020   ALBUMIN 3.8 10/10/2020   AST 55 (H) 10/10/2020   ALT 156 (H) 10/10/2020   ALKPHOS 82 10/10/2020   BILITOT 0.8 10/10/2020    GFRNONAA >60 10/10/2020   GFRAA >60 07/11/2020     Medications: I have reviewed the patient's current medications.   Assessment/Plan: 1. 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 -Maintenance pembrolizumab started on 09/06/2019. -MRI brain from 02/03/2020 showed 4 mm left parietal nodular enhancement. Differential includes metastatic versus subacute infarction. -CT CAP on 02/03/2020 showed new nodularity in the bilateral upper lungs measuring 6 mm. Ill-defined soft tissue in the medial right supraclavicular region and right paratracheal region stable versus mildly improved. Stable mild axillary lymphadenopathy, right greater than left. No suspicious findings in the abdomen or pelvis. -CT chest with contrast on 03/28/2020 showed subpleural groundglass opacity in the left lower lobe consistent with atelectasis/infection. Bilateral micronodularity, mostly in the upper part of the lungs progressive from 02/03/2020. -CT AP on 05/29/2020 did not show any evidence of metastatic disease in the abdomen or pelvis. -CT chest on 06/12/2020 shows near complete resolution of previously seen diffuse micronodularity. Asymmetric right thoracic inlet and right paratracheal soft tissue stable. -Bevacizumab 09/06/2020, pembrolizumab added 09/19/2020 -CT abdomen/pelvis 09/23/2020-fluid attenuation in segment 4B mass with surrounding hyperenhancement-indeterminate  2. Left basilic vein thrombus: -Ultrasound on 05/03/2019 showed nonocclusive thrombus around the PICC line in the left basilic vein considered superficial thrombus with no evidence of DVT. -Because of her metastatic cancer, she was told to continue Eliquis.  3. Brain metastasis: -MRI of the brain on 04/26/2020 showed left parietal lesion measuring 2.0 x 1.6 cm. Left frontal enhancing lesion measures 1.2 x 0.9 cm. 1.7 x 1 point centimeters  enhancing dorsal left frontal/superior temporal lesion present. -SRS to the brain lesions completed on 05/24/2020. 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 3 treated parenchymal brain metastasis with continued extensive vasogenic edema. Largest and most inferior has enlarged by 8 mm. Other 2 lesions are stable. -Whole brain radiation 09/12/2020-09/19/2020     Disposition: Tara Aguilar has metastatic non-small cell lung cancer.  She was recently admitted to the hospital with altered mental status, constipation, urinary retention.  She enrolled in hospice care at discharge from the hospital, but has decided to withdrawal from hospice.  She would like to continue treatment with bevacizumab and pembrolizumab.  She appears to be tolerating the treatment well.  A CT of the abdomen during the recent hospital admission revealed an indeterminate right liver lesion, potentially an abscess.  She does not have symptoms of an infection at present.  I will defer additional work-up with an MRI to Dr. Delton Coombes.  Tara Aguilar will complete another treatment with bevacizumab pembrolizumab today.  The TSH is mildly elevated.  We will check a T4.  The plan will be to start thyroid hormone replacement for progressive rise in the TSH or low T4 level.  She will return for an office visit and bevacizumab/pembrolizumab in 3 weeks.  She will continue Decadron at a dose of 2 mg daily.  Betsy Coder, MD  10/10/2020  9:23 AM

## 2020-10-12 LAB — T4: T4, Total: 7.5 ug/dL (ref 4.5–12.0)

## 2020-10-16 ENCOUNTER — Other Ambulatory Visit (HOSPITAL_COMMUNITY): Payer: Self-pay

## 2020-10-16 DIAGNOSIS — C3490 Malignant neoplasm of unspecified part of unspecified bronchus or lung: Secondary | ICD-10-CM

## 2020-10-16 DIAGNOSIS — C7931 Secondary malignant neoplasm of brain: Secondary | ICD-10-CM

## 2020-10-16 MED ORDER — MISC. DEVICES MISC: 0 refills | Status: AC

## 2020-10-16 NOTE — Progress Notes (Signed)
Order placed for palliative care referral and DME orders faxed to [facility].

## 2020-10-18 ENCOUNTER — Telehealth: Payer: Self-pay | Admitting: Radiation Oncology

## 2020-10-18 NOTE — Telephone Encounter (Signed)
  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 The decision to forgo therapy was due to hospice enrollment.  05/18/20 - 05/24/20 SRS Treatment: 27 Gy in 3 fractions. ExacTrac, 6 vmat beams, Max dose=123.6% PTV1 LtPar23mm PTV2 LtFront52mm PTV3 LtTemp38mm  03/17/19-03/31/19:  30 Gy in 10 fractions to the RUL target  Narrative:  The patient was contacted today for routine follow-up. During treatment she did very well with radiotherapy and did not have significant desquamation. She was given dexamethasone by Dr. Mickeal Skinner but I can't tell if she's still taking this.    Impression/Plan: 1. 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 I was unable to reach the patient today but left a message encouraging her to reach out to [place] and if she is still taking steroids as well to contact Dr. Mickeal Skinner. We will continue to follow her as needed, but she will also continue to follow up with Dr. Benay Spice and Dr. Mickeal Skinner in medical oncology.      Carola Rhine, PAC

## 2020-10-22 ENCOUNTER — Ambulatory Visit (HOSPITAL_COMMUNITY): Payer: BC Managed Care – PPO

## 2020-10-22 ENCOUNTER — Other Ambulatory Visit (HOSPITAL_COMMUNITY): Payer: BC Managed Care – PPO

## 2020-10-22 ENCOUNTER — Encounter (HOSPITAL_COMMUNITY): Payer: Self-pay

## 2020-10-29 ENCOUNTER — Other Ambulatory Visit (HOSPITAL_COMMUNITY): Payer: Self-pay

## 2020-10-29 MED ORDER — CIPROFLOXACIN HCL 500 MG PO TABS: 500.0000 mg | ORAL_TABLET | Freq: Two times a day (BID) | ORAL | 0 refills | Status: AC

## 2020-10-31 ENCOUNTER — Ambulatory Visit (HOSPITAL_COMMUNITY): Payer: BC Managed Care – PPO | Admitting: Hematology

## 2020-10-31 ENCOUNTER — Ambulatory Visit (HOSPITAL_COMMUNITY): Payer: BC Managed Care – PPO

## 2020-10-31 ENCOUNTER — Inpatient Hospital Stay (HOSPITAL_COMMUNITY): Payer: BC Managed Care – PPO | Attending: Hematology

## 2020-11-01 ENCOUNTER — Encounter (HOSPITAL_COMMUNITY): Payer: Self-pay

## 2020-11-01 NOTE — Progress Notes (Signed)
Notification received from [facility] that patient has re-enrolled in Hospice with their facility. Dr. Delton Coombes aware and agreeable with plan.

## 2020-11-06 ENCOUNTER — Other Ambulatory Visit (HOSPITAL_COMMUNITY): Payer: Self-pay | Admitting: *Deleted

## 2020-11-06 MED ORDER — GABAPENTIN 300 MG PO CAPS: 300.0000 mg | ORAL_CAPSULE | Freq: Two times a day (BID) | ORAL | 1 refills | Status: AC

## 2020-11-27 DEATH — deceased
